# Patient Record
Sex: Female | Born: 1950 | Race: Black or African American | Hispanic: No | Marital: Single | Smoking: Never smoker
Health system: Southern US, Community
[De-identification: ages and names within clinical notes are randomized; demographics above are authoritative.]

## PROBLEM LIST (undated history)

## (undated) DIAGNOSIS — R4586 Emotional lability: Secondary | ICD-10-CM

## (undated) DIAGNOSIS — S8292XA Unspecified fracture of left lower leg, initial encounter for closed fracture: Secondary | ICD-10-CM

## (undated) DIAGNOSIS — Z978 Presence of other specified devices: Secondary | ICD-10-CM

## (undated) DIAGNOSIS — M1990 Unspecified osteoarthritis, unspecified site: Secondary | ICD-10-CM

## (undated) DIAGNOSIS — R569 Unspecified convulsions: Secondary | ICD-10-CM

## (undated) DIAGNOSIS — D696 Thrombocytopenia, unspecified: Secondary | ICD-10-CM

## (undated) DIAGNOSIS — S31809A Unspecified open wound of unspecified buttock, initial encounter: Secondary | ICD-10-CM

## (undated) DIAGNOSIS — D860 Sarcoidosis of lung: Secondary | ICD-10-CM

## (undated) DIAGNOSIS — K589 Irritable bowel syndrome without diarrhea: Secondary | ICD-10-CM

## (undated) DIAGNOSIS — R519 Headache, unspecified: Secondary | ICD-10-CM

## (undated) DIAGNOSIS — I10 Essential (primary) hypertension: Secondary | ICD-10-CM

## (undated) DIAGNOSIS — I890 Lymphedema, not elsewhere classified: Secondary | ICD-10-CM

## (undated) DIAGNOSIS — C801 Malignant (primary) neoplasm, unspecified: Secondary | ICD-10-CM

## (undated) DIAGNOSIS — R51 Headache: Secondary | ICD-10-CM

## (undated) HISTORY — DX: Unspecified fracture of left lower leg, initial encounter for closed fracture: S82.92XA

## (undated) HISTORY — PX: ORIF ELBOW FRACTURE: SUR928

## (undated) HISTORY — PX: TIBIA FRACTURE SURGERY: SHX806

## (undated) HISTORY — PX: EYE SURGERY: SHX253

## (undated) HISTORY — PX: CARPAL TUNNEL RELEASE: SHX101

## (undated) HISTORY — PX: COLONOSCOPY: SHX174

## (undated) HISTORY — PX: CHOLECYSTECTOMY: SHX55

---

## 2006-03-27 DIAGNOSIS — C801 Malignant (primary) neoplasm, unspecified: Secondary | ICD-10-CM

## 2006-03-27 HISTORY — DX: Malignant (primary) neoplasm, unspecified: C80.1

## 2012-11-25 ENCOUNTER — Emergency Department: Payer: Self-pay | Admitting: Emergency Medicine

## 2012-11-25 LAB — URINALYSIS, COMPLETE
Blood: NEGATIVE
Ketone: NEGATIVE
Ph: 7 (ref 4.5–8.0)
Protein: NEGATIVE
RBC,UR: 1 /HPF (ref 0–5)
Squamous Epithelial: 5
WBC UR: 3 /HPF (ref 0–5)

## 2012-11-25 LAB — COMPREHENSIVE METABOLIC PANEL
Anion Gap: 7 (ref 7–16)
BUN: 10 mg/dL (ref 7–18)
Bilirubin,Total: 1.5 mg/dL — ABNORMAL HIGH (ref 0.2–1.0)
Calcium, Total: 8.7 mg/dL (ref 8.5–10.1)
Chloride: 100 mmol/L (ref 98–107)
Co2: 28 mmol/L (ref 21–32)
Glucose: 126 mg/dL — ABNORMAL HIGH (ref 65–99)
Osmolality: 271 (ref 275–301)
SGOT(AST): 29 U/L (ref 15–37)
SGPT (ALT): 28 U/L (ref 12–78)
Sodium: 135 mmol/L — ABNORMAL LOW (ref 136–145)

## 2012-11-25 LAB — CBC
HCT: 40.7 % (ref 35.0–47.0)
MCHC: 35.1 g/dL (ref 32.0–36.0)
MCV: 91 fL (ref 80–100)
Platelet: 238 10*3/uL (ref 150–440)
RBC: 4.49 10*6/uL (ref 3.80–5.20)
RDW: 14.5 % (ref 11.5–14.5)

## 2013-05-17 ENCOUNTER — Emergency Department: Payer: Self-pay | Admitting: Emergency Medicine

## 2013-05-17 LAB — CBC WITH DIFFERENTIAL/PLATELET
BASOS ABS: 0 10*3/uL (ref 0.0–0.1)
Basophil %: 0.7 %
EOS PCT: 3.1 %
Eosinophil #: 0.1 10*3/uL (ref 0.0–0.7)
HCT: 39.6 % (ref 35.0–47.0)
HGB: 13.4 g/dL (ref 12.0–16.0)
Lymphocyte #: 1 10*3/uL (ref 1.0–3.6)
Lymphocyte %: 20.8 %
MCH: 31.3 pg (ref 26.0–34.0)
MCHC: 33.9 g/dL (ref 32.0–36.0)
MCV: 93 fL (ref 80–100)
Monocyte #: 0.3 x10 3/mm (ref 0.2–0.9)
Monocyte %: 6.9 %
NEUTROS ABS: 3.2 10*3/uL (ref 1.4–6.5)
Neutrophil %: 68.5 %
PLATELETS: 185 10*3/uL (ref 150–440)
RBC: 4.28 10*6/uL (ref 3.80–5.20)
RDW: 14.2 % (ref 11.5–14.5)
WBC: 4.7 10*3/uL (ref 3.6–11.0)

## 2013-05-17 LAB — COMPREHENSIVE METABOLIC PANEL
ALBUMIN: 3.3 g/dL — AB (ref 3.4–5.0)
ALK PHOS: 143 U/L — AB
AST: 31 U/L (ref 15–37)
Anion Gap: 3 — ABNORMAL LOW (ref 7–16)
BILIRUBIN TOTAL: 0.7 mg/dL (ref 0.2–1.0)
BUN: 10 mg/dL (ref 7–18)
CHLORIDE: 107 mmol/L (ref 98–107)
CREATININE: 0.82 mg/dL (ref 0.60–1.30)
Calcium, Total: 8.7 mg/dL (ref 8.5–10.1)
Co2: 26 mmol/L (ref 21–32)
EGFR (Non-African Amer.): >60
Glucose: 85 mg/dL (ref 65–99)
Osmolality: 270 (ref 275–301)
Potassium: 4.2 mmol/L (ref 3.5–5.1)
SGPT (ALT): 22 U/L (ref 12–78)
Sodium: 136 mmol/L (ref 136–145)
Total Protein: 7.6 g/dL (ref 6.4–8.2)

## 2013-05-17 LAB — URINALYSIS, COMPLETE
BLOOD: NEGATIVE
Bacteria: NONE SEEN
Bilirubin,UR: NEGATIVE
GLUCOSE, UR: NEGATIVE mg/dL (ref 0–75)
Ketone: NEGATIVE
Nitrite: NEGATIVE
Ph: 6 (ref 4.5–8.0)
Protein: NEGATIVE
Specific Gravity: 1.019 (ref 1.003–1.030)
Squamous Epithelial: 2
WBC UR: 2 /HPF (ref 0–5)

## 2014-01-05 ENCOUNTER — Emergency Department (HOSPITAL_COMMUNITY): Payer: Medicare Other

## 2014-01-05 ENCOUNTER — Encounter (HOSPITAL_COMMUNITY): Payer: Self-pay | Admitting: Emergency Medicine

## 2014-01-05 ENCOUNTER — Emergency Department (HOSPITAL_COMMUNITY)
Admission: EM | Admit: 2014-01-05 | Discharge: 2014-01-05 | Disposition: A | Discharge status: Home / Self Care | Payer: Medicare Other | Attending: Emergency Medicine | Admitting: Emergency Medicine

## 2014-01-05 DIAGNOSIS — Z8619 Personal history of other infectious and parasitic diseases: Secondary | ICD-10-CM | POA: Insufficient documentation

## 2014-01-05 DIAGNOSIS — I10 Essential (primary) hypertension: Secondary | ICD-10-CM | POA: Diagnosis not present

## 2014-01-05 DIAGNOSIS — Z7952 Long term (current) use of systemic steroids: Secondary | ICD-10-CM | POA: Diagnosis not present

## 2014-01-05 DIAGNOSIS — I160 Hypertensive urgency: Secondary | ICD-10-CM

## 2014-01-05 DIAGNOSIS — Z79899 Other long term (current) drug therapy: Secondary | ICD-10-CM | POA: Diagnosis not present

## 2014-01-05 DIAGNOSIS — R51 Headache: Secondary | ICD-10-CM | POA: Diagnosis present

## 2014-01-05 LAB — CBC WITH DIFFERENTIAL/PLATELET
BASOS ABS: 0 10*3/uL (ref 0.0–0.1)
BASOS PCT: 0 % (ref 0–1)
EOS PCT: 2 % (ref 0–5)
Eosinophils Absolute: 0.1 10*3/uL (ref 0.0–0.7)
HCT: 39.7 % (ref 36.0–46.0)
Hemoglobin: 13.5 g/dL (ref 12.0–15.0)
Lymphocytes Relative: 31 % (ref 12–46)
Lymphs Abs: 1.4 10*3/uL (ref 0.7–4.0)
MCH: 31 pg (ref 26.0–34.0)
MCHC: 34 g/dL (ref 30.0–36.0)
MCV: 91.1 fL (ref 78.0–100.0)
MONO ABS: 0.5 10*3/uL (ref 0.1–1.0)
Monocytes Relative: 10 % (ref 3–12)
Neutro Abs: 2.6 10*3/uL (ref 1.7–7.7)
Neutrophils Relative %: 57 % (ref 43–77)
Platelets: 190 10*3/uL (ref 150–400)
RBC: 4.36 MIL/uL (ref 3.87–5.11)
RDW: 13.3 % (ref 11.5–15.5)
WBC: 4.6 10*3/uL (ref 4.0–10.5)

## 2014-01-05 LAB — COMPREHENSIVE METABOLIC PANEL
ALK PHOS: 156 U/L — AB (ref 39–117)
ALT: 14 U/L (ref 0–35)
ANION GAP: 12 (ref 5–15)
AST: 21 U/L (ref 0–37)
Albumin: 3.8 g/dL (ref 3.5–5.2)
BILIRUBIN TOTAL: 0.9 mg/dL (ref 0.3–1.2)
BUN: 12 mg/dL (ref 6–23)
CO2: 24 meq/L (ref 19–32)
CREATININE: 0.71 mg/dL (ref 0.50–1.10)
Calcium: 9.4 mg/dL (ref 8.4–10.5)
Chloride: 100 mEq/L (ref 96–112)
GFR calc Af Amer: >90 mL/min (ref >90)
GFR calc non Af Amer: 89 mL/min — ABNORMAL LOW (ref >90)
Glucose, Bld: 84 mg/dL (ref 70–99)
POTASSIUM: 4.2 meq/L (ref 3.7–5.3)
Sodium: 136 mEq/L — ABNORMAL LOW (ref 137–147)
Total Protein: 8.2 g/dL (ref 6.0–8.3)

## 2014-01-05 MED ORDER — ACETAMINOPHEN 325 MG PO TABS
650.0000 mg | ORAL_TABLET | Freq: Once | ORAL | Status: AC
  Administered 2014-01-05: 650 mg via ORAL
  Filled 2014-01-05: qty 2

## 2014-01-05 MED ORDER — LABETALOL HCL 100 MG PO TABS
100.0000 mg | ORAL_TABLET | Freq: Once | ORAL | Status: AC
  Administered 2014-01-05: 100 mg via ORAL
  Filled 2014-01-05 (×2): qty 1

## 2014-01-05 NOTE — Discharge Instructions (Signed)

## 2014-01-05 NOTE — ED Provider Notes (Signed)
I saw and evaluated the patient, reviewed the resident's note and I agree with the findings and plan.   EKG Interpretation None       Briefly, pt is a 63 y.o. female presenting with headache, described as heaviness x 2 weeks in context of poor medical compliance and poor knowledge of health conditions.  I performed an examination on the patient including cardiac, pulmonary, and gi systems which were unremarkable.  Neuro exam nonfocal with normal gait.  States that she has a history of headaches in the past with her blood pressure being elevated and the primary precipitant for visit today was a systolic blood pressure 977.  Symptoms improved with blood pressure control. This is likely etiology of symptoms. Discharged in stable condition.  PCP: Tessa Lerner.     Debby Freiberg, MD 01/06/14 [phone]

## 2014-01-05 NOTE — ED Provider Notes (Signed)
CSN: [id number]     Arrival date & time 01/05/14  1128 History   First MD Initiated Contact with Patient 01/05/14 1505     Chief Complaint  Patient presents with  . Hypertension  . Headache     (Consider location/radiation/quality/duration/timing/severity/associated sxs/prior Treatment) Patient is a 63 y.o. female presenting with headaches. The history is provided by the patient.  Headache Pain location:  L parietal Quality:  Dull Radiates to:  Does not radiate Severity currently:  Unable to specify Severity at highest:  Unable to specify Onset quality:  Gradual Duration: Patient states she has had headaches since her concussion in 2010. Timing:  Intermittent Progression:  Waxing and waning Chronicity:  Chronic Similar to prior headaches: yes   Context: not activity and not exposure to bright light   Relieved by: Patient states she has tried "what ever she can get" Associated symptoms: no abdominal pain, no back pain, no congestion, no cough, no diarrhea, no pain, no fatigue, no fever, no nausea, no numbness, no paresthesias, no photophobia, no URI, no vomiting and no weakness   Risk factors comment:  History of sarcoid, history of htn   History reviewed. No pertinent past medical history. History reviewed. No pertinent past surgical history. History reviewed. No pertinent family history. History  Substance Use Topics  . Smoking status: Never Smoker   . Smokeless tobacco: Not on file  . Alcohol Use: No   OB History   Grav Para Term Preterm Abortions TAB SAB Ect Mult Living                 Review of Systems  Constitutional: Negative for fever, activity change, appetite change and fatigue.  HENT: Negative for congestion and rhinorrhea.   Eyes: Negative for photophobia, pain, discharge, redness and itching.  Respiratory: Negative for cough, shortness of breath and wheezing.   Cardiovascular: Negative for chest pain.  Gastrointestinal: Negative for nausea, vomiting,  abdominal pain and diarrhea.  Genitourinary: Negative for dysuria and hematuria.  Musculoskeletal: Negative for back pain.  Skin: Negative for rash and wound.  Neurological: Positive for headaches. Negative for syncope, numbness and paresthesias.      Allergies  Review of patient's allergies indicates no known allergies.  Home Medications   Prior to Admission medications   Medication Sig Start Date End Date Taking? Authorizing Provider  amLODipine (NORVASC) 2.5 MG tablet Take 2.5 mg by mouth daily.   Yes Historical Provider, MD  prednisoLONE 5 MG TABS tablet Take 5 mg by mouth daily.   Yes Historical Provider, MD  spironolactone (ALDACTONE) 25 MG tablet Take 25 mg by mouth 2 (two) times daily.   Yes Historical Provider, MD   BP 157/73  Pulse 67  Temp(Src) 98.3 F (36.8 C) (Oral)  Resp 18  SpO2 100% Physical Exam  Constitutional: She is oriented to person, place, and time. She appears well-developed and well-nourished. No distress.  HENT:  Head: Normocephalic and atraumatic.  Mouth/Throat: Oropharynx is clear and moist. No oropharyngeal exudate.  Eyes: Conjunctivae and EOM are normal. Pupils are equal, round, and reactive to light. Right eye exhibits no discharge. Left eye exhibits no discharge. No scleral icterus.  Neck: Normal range of motion. Neck supple.  Full range of motion without rigidity  Cardiovascular: Normal rate, regular rhythm and normal heart sounds.   No murmur heard. Pulmonary/Chest: Effort normal and breath sounds normal. No respiratory distress. She has no wheezes. She has no rales.  Abdominal: Soft. She exhibits no distension and no mass.  There is no tenderness.  Neurological: She is alert and oriented to person, place, and time. She has normal reflexes. No cranial nerve deficit. She exhibits normal muscle tone. Coordination normal.  5/5 strength in all extremities  Normal sensation in all exts 2+ DTRs patella and brachioradialis Cranial nerves to 12  intact No ataxia on ambulation   Skin: Skin is warm. No rash noted. She is not diaphoretic.    ED Course  Procedures (including critical care time) Labs Review Labs Reviewed  COMPREHENSIVE METABOLIC PANEL - Abnormal; Notable for the following:    Sodium 136 (*)    Alkaline Phosphatase 156 (*)    GFR calc non Af Amer 89 (*)    All other components within normal limits  CBC WITH DIFFERENTIAL    Imaging Review Ct Head Wo Contrast  01/05/2014   CLINICAL DATA:  Hypertension, headache.  EXAM: CT HEAD WITHOUT CONTRAST  TECHNIQUE: Contiguous axial images were obtained from the base of the skull through the vertex without intravenous contrast.  COMPARISON:  04/06/2011  FINDINGS: No acute intracranial abnormality. Specifically, no hemorrhage, hydrocephalus, mass lesion, acute infarction, or significant intracranial injury. No acute calvarial abnormality. Visualized paranasal sinuses and mastoids clear. Orbital soft tissues unremarkable.  IMPRESSION: No acute intracranial abnormality.   Electronically Signed   By: Rolm Baptise M.D.   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 Today had similar headache for the past one to 2 weeks. Unlikely meningitis. Patient's older history of sarcoidosis that is partially treated concern for intracranial process. We'll obtain CT scan. Patient concerned about blood pressure however is elevated but not critical high and no signs of endorgan dysfunction. Instructed patient to followup PCP for further blood pressure management. APAP given for pain. Labetalol Po given for blood pressure. She feels better. Symptoms questionable for postconcussion or hypertensive urgency. Head CT negative for bleed or other intracranial process. Exam not consistent with meningitis. Nnormal neuro exam with good gait  Patient has PCP to she will call tomorrow. Discharged..  Final diagnoses:   Hypertensive urgency    Discharge Medication List as of 01/05/2014  6:16 PM     Marco Collie, MD [address] [place] [address] [phone]  Schedule an appointment as soon as possible for a visit     Sol Passer, MD 01/05/14 2129

## 2014-01-05 NOTE — ED Notes (Signed)
Assisted pt to restroom without incident. Denies pain or dizziness

## 2014-01-05 NOTE — ED Notes (Signed)
Pt ambulatory to room with  No issues.

## 2014-01-05 NOTE — ED Notes (Signed)
Pt c/o HA and htn; pt sts not taking meds as supposed to; pt thinks her potassium may be low

## 2014-05-25 ENCOUNTER — Encounter (HOSPITAL_COMMUNITY): Payer: Self-pay | Admitting: *Deleted

## 2014-05-25 ENCOUNTER — Emergency Department (HOSPITAL_COMMUNITY)
Admission: EM | Admit: 2014-05-25 | Discharge: 2014-05-25 | Disposition: A | Discharge status: Home / Self Care | Payer: Medicare Other | Attending: Emergency Medicine | Admitting: Emergency Medicine

## 2014-05-25 DIAGNOSIS — Z79899 Other long term (current) drug therapy: Secondary | ICD-10-CM | POA: Insufficient documentation

## 2014-05-25 DIAGNOSIS — Z8719 Personal history of other diseases of the digestive system: Secondary | ICD-10-CM | POA: Diagnosis not present

## 2014-05-25 DIAGNOSIS — Z7952 Long term (current) use of systemic steroids: Secondary | ICD-10-CM | POA: Insufficient documentation

## 2014-05-25 DIAGNOSIS — Z862 Personal history of diseases of the blood and blood-forming organs and certain disorders involving the immune mechanism: Secondary | ICD-10-CM | POA: Diagnosis not present

## 2014-05-25 DIAGNOSIS — R1013 Epigastric pain: Secondary | ICD-10-CM | POA: Insufficient documentation

## 2014-05-25 DIAGNOSIS — R197 Diarrhea, unspecified: Secondary | ICD-10-CM | POA: Diagnosis not present

## 2014-05-25 DIAGNOSIS — R112 Nausea with vomiting, unspecified: Secondary | ICD-10-CM | POA: Insufficient documentation

## 2014-05-25 DIAGNOSIS — Z85048 Personal history of other malignant neoplasm of rectum, rectosigmoid junction, and anus: Secondary | ICD-10-CM | POA: Diagnosis not present

## 2014-05-25 DIAGNOSIS — I10 Essential (primary) hypertension: Secondary | ICD-10-CM | POA: Diagnosis not present

## 2014-05-25 DIAGNOSIS — R109 Unspecified abdominal pain: Secondary | ICD-10-CM

## 2014-05-25 HISTORY — DX: Irritable bowel syndrome, unspecified: K58.9

## 2014-05-25 HISTORY — DX: Essential (primary) hypertension: I10

## 2014-05-25 HISTORY — DX: Malignant (primary) neoplasm, unspecified: C80.1

## 2014-05-25 HISTORY — DX: Sarcoidosis of lung: D86.0

## 2014-05-25 LAB — CBC WITH DIFFERENTIAL/PLATELET
BASOS ABS: 0 10*3/uL (ref 0.0–0.1)
BASOS PCT: 0 % (ref 0–1)
Eosinophils Absolute: 0.1 10*3/uL (ref 0.0–0.7)
Eosinophils Relative: 0 % (ref 0–5)
HCT: 46.3 % — ABNORMAL HIGH (ref 36.0–46.0)
HEMOGLOBIN: 15.6 g/dL — AB (ref 12.0–15.0)
Lymphocytes Relative: 9 % — ABNORMAL LOW (ref 12–46)
Lymphs Abs: 1.1 10*3/uL (ref 0.7–4.0)
MCH: 31 pg (ref 26.0–34.0)
MCHC: 33.7 g/dL (ref 30.0–36.0)
MCV: 91.9 fL (ref 78.0–100.0)
MONOS PCT: 7 % (ref 3–12)
Monocytes Absolute: 0.8 10*3/uL (ref 0.1–1.0)
NEUTROS ABS: 10.2 10*3/uL — AB (ref 1.7–7.7)
NEUTROS PCT: 84 % — AB (ref 43–77)
PLATELETS: 210 10*3/uL (ref 150–400)
RBC: 5.04 MIL/uL (ref 3.87–5.11)
RDW: 13.7 % (ref 11.5–15.5)
WBC: 12.1 10*3/uL — ABNORMAL HIGH (ref 4.0–10.5)

## 2014-05-25 LAB — COMPREHENSIVE METABOLIC PANEL
ALBUMIN: 3.2 g/dL — AB (ref 3.5–5.2)
ALT: 17 U/L (ref 0–35)
ANION GAP: 6 (ref 5–15)
AST: 20 U/L (ref 0–37)
Alkaline Phosphatase: 107 U/L (ref 39–117)
BUN: 11 mg/dL (ref 6–23)
CO2: 27 mmol/L (ref 19–32)
Calcium: 8.6 mg/dL (ref 8.4–10.5)
Chloride: 103 mmol/L (ref 96–112)
Creatinine, Ser: 0.8 mg/dL (ref 0.50–1.10)
GFR calc non Af Amer: 76 mL/min — ABNORMAL LOW (ref >90)
GFR, EST AFRICAN AMERICAN: 88 mL/min — AB (ref >90)
GLUCOSE: 114 mg/dL — AB (ref 70–99)
Potassium: 3.1 mmol/L — ABNORMAL LOW (ref 3.5–5.1)
Sodium: 136 mmol/L (ref 135–145)
TOTAL PROTEIN: 6.9 g/dL (ref 6.0–8.3)
Total Bilirubin: 1.5 mg/dL — ABNORMAL HIGH (ref 0.3–1.2)

## 2014-05-25 LAB — I-STAT TROPONIN, ED: TROPONIN I, POC: 0 ng/mL (ref 0.00–0.08)

## 2014-05-25 LAB — LIPASE, BLOOD: Lipase: 22 U/L (ref 11–59)

## 2014-05-25 MED ORDER — ONDANSETRON 4 MG PO TBDP: 4.0000 mg | ORAL_TABLET | Freq: Three times a day (TID) | ORAL | Status: DC | PRN

## 2014-05-25 MED ORDER — HYDROCODONE-ACETAMINOPHEN 5-325 MG PO TABS: 1.0000 | ORAL_TABLET | ORAL | Status: DC | PRN

## 2014-05-25 MED ORDER — FAMOTIDINE IN NACL 20-0.9 MG/50ML-% IV SOLN
20.0000 mg | Freq: Once | INTRAVENOUS | Status: AC
  Administered 2014-05-25: 20 mg via INTRAVENOUS
  Filled 2014-05-25: qty 50

## 2014-05-25 MED ORDER — MORPHINE SULFATE 4 MG/ML IJ SOLN
4.0000 mg | Freq: Once | INTRAMUSCULAR | Status: AC
  Administered 2014-05-25: 4 mg via INTRAVENOUS
  Filled 2014-05-25: qty 1

## 2014-05-25 MED ORDER — POTASSIUM CHLORIDE CRYS ER 20 MEQ PO TBCR
40.0000 meq | EXTENDED_RELEASE_TABLET | Freq: Once | ORAL | Status: AC
  Administered 2014-05-25: 40 meq via ORAL
  Filled 2014-05-25: qty 2

## 2014-05-25 MED ORDER — SODIUM CHLORIDE 0.9 % IV BOLUS (SEPSIS)
500.0000 mL | Freq: Once | INTRAVENOUS | Status: AC
  Administered 2014-05-25: 500 mL via INTRAVENOUS

## 2014-05-25 NOTE — ED Provider Notes (Signed)
CSN: [id number]     Arrival date & time 05/25/14  0919 History   First MD Initiated Contact with Patient 05/25/14 [phone]     Chief Complaint  Patient presents with  . Abdominal Pain  . Nausea     (Consider location/radiation/quality/duration/timing/severity/associated sxs/prior Treatment) Patient is a 64 y.o. female presenting with abdominal pain. The history is provided by the patient and medical records.  Abdominal Pain Associated symptoms: diarrhea, nausea and vomiting     This is a 64 year old female with past medical history significant for hypertension, IBS, prior anal cancer, presenting to the ED for epigastric pain, nausea, vomiting, and diarrhea onset last night around 2130.  Patient states yesterday morning she ate some bacon from [employer] that she thinks was expired. States later that afternoon she ate a fish fillet sandwich and french fries from [employer] and began vomiting soon afterwards. She states she has some generalized abdominal cramping. She is a multiple episodes of nonbloody, nonbilious emesis and watery diarrhea. No melena or hematochezia. She denies any fever or chills. No sick contacts. She denies any urinary symptoms.  Prior abdominal surgeries include cholecystectomy.  Patient was given 4 of Zofran and route, still continues to have some nausea. Vital signs stable on arrival.  Past Medical History  Diagnosis Date  . IBS (irritable bowel syndrome)   . Cancer 2008    anal  . Sarcoidosis of lung   . Hypertension    Past Surgical History  Procedure Laterality Date  . Eye surgery      r/t sarcoidisis  . Cholecystectomy    . Carpal tunnel release     No family history on file. History  Substance Use Topics  . Smoking status: Never Smoker   . Smokeless tobacco: Not on file  . Alcohol Use: No   OB History    No data available     Review of Systems  Gastrointestinal: Positive for nausea, vomiting, abdominal pain and diarrhea.  All other systems reviewed  and are negative.     Allergies  Review of patient's allergies indicates no known allergies.  Home Medications   Prior to Admission medications   Medication Sig Start Date End Date Taking? Authorizing Provider  amLODipine (NORVASC) 2.5 MG tablet Take 2.5 mg by mouth daily.    Historical Provider, MD  prednisoLONE 5 MG TABS tablet Take 5 mg by mouth daily.    Historical Provider, MD  spironolactone (ALDACTONE) 25 MG tablet Take 25 mg by mouth 2 (two) times daily.    Historical Provider, MD   BP 152/88 mmHg  Pulse 77  Temp(Src) 97.8 F (36.6 C) (Oral)  Resp 16  Ht 5\' 4"  (1.626 m)  Wt 159 lb (72.122 kg)  BMI 27.28 kg/m2  SpO2 96%   Physical Exam  Constitutional: She is oriented to person, place, and time. She appears well-developed and well-nourished. No distress.  HENT:  Head: Normocephalic and atraumatic.  Mouth/Throat: Oropharynx is clear and moist.  Mucous membranes remain moist  Eyes: Conjunctivae and EOM are normal. Pupils are equal, round, and reactive to light.  Neck: Normal range of motion. Neck supple.  Cardiovascular: Normal rate, regular rhythm and normal heart sounds.   Pulmonary/Chest: Effort normal and breath sounds normal. No respiratory distress. She has no wheezes.  Abdominal: Soft. Bowel sounds are normal. There is no tenderness. There is no guarding and no CVA tenderness.  Abdomen soft, non-distended, no focal tenderness or peritoneal signs  Musculoskeletal: Normal range of motion.  Neurological: She  is alert and oriented to person, place, and time.  Skin: Skin is warm and dry. She is not diaphoretic.  Psychiatric: She has a normal mood and affect.  Nursing note and vitals reviewed.   ED Course  Procedures (including critical care time) Labs Review Labs Reviewed  CBC WITH DIFFERENTIAL/PLATELET - Abnormal; Notable for the following:    WBC 12.1 (*)    Hemoglobin 15.6 (*)    HCT 46.3 (*)    Neutrophils Relative % 84 (*)    Neutro Abs 10.2 (*)     Lymphocytes Relative 9 (*)    All other components within normal limits  COMPREHENSIVE METABOLIC PANEL - Abnormal; Notable for the following:    Potassium 3.1 (*)    Glucose, Bld 114 (*)    Albumin 3.2 (*)    Total Bilirubin 1.5 (*)    GFR calc non Af Amer 76 (*)    GFR calc Af Amer 88 (*)    All other components within normal limits  LIPASE, BLOOD  I-STAT TROPOININ, ED    Imaging Review No results found.   EKG Interpretation None      MDM   Final diagnoses:  Abdominal pain, unspecified abdominal location  Nausea vomiting and diarrhea   64 y.o. F with N/V/D onset last night after eating bacon which she thinks was expired followed by fish sandwich and french fries.  On arrival, patient afebrile and non-toxic in appearance.  She is in NAD, her abdominal exam is benign.  She was given zofran en route but remains somewhat nauseated.  Will give additional meds, will obtain basic labs.  No chest pain or SOB.  Lab work overall reassuring.  Mild hypokalemia noted, will be replaced here.  After additional meds, patient states she is feeling much better.  She has tolerated PO without difficulty.  Abdominal exam remains benign.  Patient will be d/c home with supportive care.  Encouraged gentle diet and progress back to normal as tolerated, plenty of fluids to stay hydrated.  FU with PCP.  Discussed plan with patient, he/she acknowledged understanding and agreed with plan of care.  Return precautions given for new or worsening symptoms.  Larene Pickett, PA-C 05/25/14 [place], MD 05/26/14 [phone]

## 2014-05-25 NOTE — ED Notes (Addendum)
Pt with acute onset epigastric pain, nausea and diarrhea around 2130 last night.  She feels it's r/t the bacon she ate earlier that day.  Given 4 mg zofran enroute.

## 2014-05-25 NOTE — Discharge Instructions (Signed)
Take the prescribed medication as directed.  May wish to start with gentle diet and progress back to normal as tolerated. Follow-up with your primary care physician. Return to the ED for new or worsening symptoms.

## 2014-06-30 ENCOUNTER — Ambulatory Visit
Admit: 2014-06-30 | Disposition: A | Discharge status: Home / Self Care | Payer: Self-pay | Attending: Family Medicine | Admitting: Family Medicine

## 2014-07-06 ENCOUNTER — Ambulatory Visit
Admit: 2014-07-06 | Disposition: A | Discharge status: Home / Self Care | Payer: Self-pay | Attending: Family Medicine | Admitting: Family Medicine

## 2014-10-22 ENCOUNTER — Encounter (HOSPITAL_COMMUNITY): Payer: Self-pay | Admitting: Emergency Medicine

## 2014-10-22 DIAGNOSIS — Z8719 Personal history of other diseases of the digestive system: Secondary | ICD-10-CM | POA: Insufficient documentation

## 2014-10-22 DIAGNOSIS — M25562 Pain in left knee: Secondary | ICD-10-CM | POA: Diagnosis not present

## 2014-10-22 DIAGNOSIS — Z85048 Personal history of other malignant neoplasm of rectum, rectosigmoid junction, and anus: Secondary | ICD-10-CM | POA: Insufficient documentation

## 2014-10-22 DIAGNOSIS — Z79899 Other long term (current) drug therapy: Secondary | ICD-10-CM | POA: Insufficient documentation

## 2014-10-22 DIAGNOSIS — Z862 Personal history of diseases of the blood and blood-forming organs and certain disorders involving the immune mechanism: Secondary | ICD-10-CM | POA: Insufficient documentation

## 2014-10-22 DIAGNOSIS — I10 Essential (primary) hypertension: Secondary | ICD-10-CM | POA: Insufficient documentation

## 2014-10-22 DIAGNOSIS — M1990 Unspecified osteoarthritis, unspecified site: Secondary | ICD-10-CM | POA: Insufficient documentation

## 2014-10-22 DIAGNOSIS — M25552 Pain in left hip: Secondary | ICD-10-CM | POA: Diagnosis present

## 2014-10-22 DIAGNOSIS — Z7952 Long term (current) use of systemic steroids: Secondary | ICD-10-CM | POA: Insufficient documentation

## 2014-10-22 NOTE — ED Notes (Signed)
Pt. reports chronic left hip pain worse today , denies injury or fall , pt. also reported left knee pain .

## 2014-10-23 ENCOUNTER — Emergency Department (HOSPITAL_COMMUNITY)
Admission: EM | Admit: 2014-10-23 | Discharge: 2014-10-23 | Disposition: A | Discharge status: Home / Self Care | Payer: Medicare Other | Attending: Emergency Medicine | Admitting: Emergency Medicine

## 2014-10-23 DIAGNOSIS — M1990 Unspecified osteoarthritis, unspecified site: Secondary | ICD-10-CM

## 2014-10-23 MED ORDER — HYDROCODONE-ACETAMINOPHEN 5-325 MG PO TABS: 1.0000 | ORAL_TABLET | ORAL | Status: DC | PRN

## 2014-10-23 MED ORDER — MELOXICAM 7.5 MG PO TABS: 7.5000 mg | ORAL_TABLET | Freq: Every day | ORAL | Status: DC

## 2014-10-23 MED ORDER — HYDROCODONE-ACETAMINOPHEN 5-325 MG PO TABS
1.0000 | ORAL_TABLET | Freq: Once | ORAL | Status: AC
  Administered 2014-10-23: 1 via ORAL
  Filled 2014-10-23: qty 1

## 2014-10-23 NOTE — Discharge Instructions (Signed)
Arthritis, Nonspecific °Arthritis is inflammation of a joint. This usually means pain, redness, warmth or swelling are present. One or more joints may be involved. There are a number of types of arthritis. Your caregiver may not be able to tell what type of arthritis you have right away. °CAUSES  °The most common cause of arthritis is the wear and tear on the joint (osteoarthritis). This causes damage to the cartilage, which can break down over time. The knees, hips, back and neck are most often affected by this type of arthritis. °Other types of arthritis and common causes of joint pain include: °· Sprains and other injuries near the joint. Sometimes minor sprains and injuries cause pain and swelling that develop hours later. °· Rheumatoid arthritis. This affects hands, feet and knees. It usually affects both sides of your body at the same time. It is often associated with chronic ailments, fever, weight loss and general weakness. °· Crystal arthritis. Gout and pseudo gout can cause occasional acute severe pain, redness and swelling in the foot, ankle, or knee. °· Infectious arthritis. Bacteria can get into a joint through a break in overlying skin. This can cause infection of the joint. Bacteria and viruses can also spread through the blood and affect your joints. °· Drug, infectious and allergy reactions. Sometimes joints can become mildly painful and slightly swollen with these types of illnesses. °SYMPTOMS  °· Pain is the main symptom. °· Your joint or joints can also be red, swollen and warm or hot to the touch. °· You may have a fever with certain types of arthritis, or even feel overall ill. °· The joint with arthritis will hurt with movement. Stiffness is present with some types of arthritis. °DIAGNOSIS  °Your caregiver will suspect arthritis based on your description of your symptoms and on your exam. Testing may be needed to find the type of arthritis: °· Blood and sometimes urine tests. °· X-ray tests  and sometimes CT or MRI scans. °· Removal of fluid from the joint (arthrocentesis) is done to check for bacteria, crystals or other causes. Your caregiver (or a specialist) will numb the area over the joint with a local anesthetic, and use a needle to remove joint fluid for examination. This procedure is only minimally uncomfortable. °· Even with these tests, your caregiver may not be able to tell what kind of arthritis you have. Consultation with a specialist (rheumatologist) may be helpful. °TREATMENT  °Your caregiver will discuss with you treatment specific to your type of arthritis. If the specific type cannot be determined, then the following general recommendations may apply. °Treatment of severe joint pain includes: °· Rest. °· Elevation. °· Anti-inflammatory medication (for example, ibuprofen) may be prescribed. Avoiding activities that cause increased pain. °· Only take over-the-counter or prescription medicines for pain and discomfort as recommended by your caregiver. °· Cold packs over an inflamed joint may be used for 10 to 15 minutes every hour. Hot packs sometimes feel better, but do not use overnight. Do not use hot packs if you are diabetic without your caregiver's permission. °· A cortisone shot into arthritic joints may help reduce pain and swelling. °· Any acute arthritis that gets worse over the next 1 to 2 days needs to be looked at to be sure there is no joint infection. °Long-term arthritis treatment involves modifying activities and lifestyle to reduce joint stress jarring. This can include weight loss. Also, exercise is needed to nourish the joint cartilage and remove waste. This helps keep the muscles   around the joint strong. °HOME CARE INSTRUCTIONS  °· Do not take aspirin to relieve pain if gout is suspected. This elevates uric acid levels. °· Only take over-the-counter or prescription medicines for pain, discomfort or fever as directed by your caregiver. °· Rest the joint as much as  possible. °· If your joint is swollen, keep it elevated. °· Use crutches if the painful joint is in your leg. °· Drinking plenty of fluids may help for certain types of arthritis. °· Follow your caregiver's dietary instructions. °· Try low-impact exercise such as: °¨ Swimming. °¨ Water aerobics. °¨ Biking. °¨ Walking. °· Morning stiffness is often relieved by a warm shower. °· Put your joints through regular range-of-motion. °SEEK MEDICAL CARE IF:  °· You do not feel better in 24 hours or are getting worse. °· You have side effects to medications, or are not getting better with treatment. °SEEK IMMEDIATE MEDICAL CARE IF:  °· You have a fever. °· You develop severe joint pain, swelling or redness. °· Many joints are involved and become painful and swollen. °· There is severe back pain and/or leg weakness. °· You have loss of bowel or bladder control. °Document Released: 04/20/2004 Document Revised: 06/05/2011 Document Reviewed: 05/06/2008 °[employer]® Patient Information ©2015 [employer]. This information is not intended to replace advice given to you by your health care provider. Make sure you discuss any questions you have with your health care provider. ° °

## 2014-10-23 NOTE — ED Provider Notes (Signed)
CSN: [id number]     Arrival date & time 10/22/14  2341 History   First MD Initiated Contact with Patient 10/23/14 0232     Chief Complaint  Patient presents with  . Hip Pain  . Knee Pain     (Consider location/radiation/quality/duration/timing/severity/associated sxs/prior Treatment) Patient is a 64 y.o. female presenting with hip pain and knee pain. The history is provided by the patient. No language interpreter was used.  Hip Pain This is a chronic problem. Pertinent negatives include no abdominal pain, chest pain, chills, fever or numbness. Associated symptoms comments: Known arthritis of left hip with uncontrolled pain at home. No fall or new injury. She is taking neurontin for pain without relief. She reports that her left knee is now painful and swollen as well. She has been told she needs a knee replacement but is unable to pursue the procedure currently..  Knee Pain Associated symptoms: no fever     Past Medical History  Diagnosis Date  . IBS (irritable bowel syndrome)   . Cancer 2008    anal  . Sarcoidosis of lung   . Hypertension    Past Surgical History  Procedure Laterality Date  . Eye surgery      r/t sarcoidisis  . Cholecystectomy    . Carpal tunnel release     No family history on file. History  Substance Use Topics  . Smoking status: Never Smoker   . Smokeless tobacco: Not on file  . Alcohol Use: No   OB History    No data available     Review of Systems  Constitutional: Negative for fever and chills.  Respiratory: Negative.  Negative for shortness of breath.   Cardiovascular: Negative.  Negative for chest pain.  Gastrointestinal: Negative.  Negative for abdominal pain.  Genitourinary: Negative for enuresis.  Musculoskeletal:       See HPI.  Skin: Negative.  Negative for color change.  Neurological: Negative.  Negative for numbness.      Allergies  Review of patient's allergies indicates no known allergies.  Home Medications   Prior to  Admission medications   Medication Sig Start Date End Date Taking? Authorizing Provider  amLODipine (NORVASC) 5 MG tablet Take 5 mg by mouth daily.   Yes Historical Provider, MD  gabapentin (NEURONTIN) 100 MG capsule Take 100 mg by mouth 3 (three) times daily.   Yes Historical Provider, MD  lisinopril-hydrochlorothiazide (PRINZIDE,ZESTORETIC) 20-25 MG per tablet Take 1 tablet by mouth daily.   Yes Historical Provider, MD  predniSONE (DELTASONE) 5 MG tablet Take 5 mg by mouth daily.   Yes Historical Provider, MD  HYDROcodone-acetaminophen (NORCO/VICODIN) 5-325 MG per tablet Take 1 tablet by mouth every 4 (four) hours as needed. Patient not taking: Reported on 10/23/2014 05/25/14   Larene Pickett, PA-C  ondansetron (ZOFRAN ODT) 4 MG disintegrating tablet Take 1 tablet (4 mg total) by mouth every 8 (eight) hours as needed for nausea. Patient not taking: Reported on 10/23/2014 05/25/14   Larene Pickett, PA-C   BP 166/80 mmHg  Pulse 80  Temp(Src) 98 F (36.7 C) (Oral)  Resp 14  Ht 5\' 4"  (1.626 m)  Wt 156 lb (70.761 kg)  BMI 26.76 kg/m2  SpO2 96% Physical Exam  Constitutional: She is oriented to person, place, and time. She appears well-developed and well-nourished.  Neck: Normal range of motion.  Cardiovascular: Intact distal pulses.   Pulmonary/Chest: Effort normal.  Musculoskeletal:  Left knee mildly swollen without effusion. No redness or warmth. Joint  stable. No calf or thigh tenderness of swelling. Left hip markedly tender over lateral surface.   Neurological: She is alert and oriented to person, place, and time.  Skin: Skin is warm and dry.    ED Course  Procedures (including critical care time) Labs Review Labs Reviewed - No data to display  Imaging Review No results found.   EKG Interpretation None      MDM   Final diagnoses:  None    1. Arthritis  Will provide Mobic and pain relief (Norco #15). Encouraged follow up with ortho as soon as possible for pain management  and further treatment of joint pain/arthritis. Recommended an assistive walking device, i.e. Walker or cane.    Charlann Lange, PA-C 10/23/14 2957  Everlene Balls, MD 10/23/14 1440

## 2015-03-31 ENCOUNTER — Emergency Department (HOSPITAL_COMMUNITY)
Admission: EM | Admit: 2015-03-31 | Discharge: 2015-03-31 | Disposition: A | Discharge status: Home / Self Care | Payer: No Typology Code available for payment source | Attending: Physician Assistant | Admitting: Physician Assistant

## 2015-03-31 ENCOUNTER — Encounter (HOSPITAL_COMMUNITY): Payer: Self-pay

## 2015-03-31 ENCOUNTER — Emergency Department (HOSPITAL_COMMUNITY): Payer: No Typology Code available for payment source

## 2015-03-31 DIAGNOSIS — Z791 Long term (current) use of non-steroidal anti-inflammatories (NSAID): Secondary | ICD-10-CM | POA: Insufficient documentation

## 2015-03-31 DIAGNOSIS — S3991XA Unspecified injury of abdomen, initial encounter: Secondary | ICD-10-CM | POA: Diagnosis not present

## 2015-03-31 DIAGNOSIS — Y998 Other external cause status: Secondary | ICD-10-CM | POA: Diagnosis not present

## 2015-03-31 DIAGNOSIS — Z85040 Personal history of malignant carcinoid tumor of rectum: Secondary | ICD-10-CM | POA: Insufficient documentation

## 2015-03-31 DIAGNOSIS — Z79899 Other long term (current) drug therapy: Secondary | ICD-10-CM | POA: Insufficient documentation

## 2015-03-31 DIAGNOSIS — I10 Essential (primary) hypertension: Secondary | ICD-10-CM | POA: Insufficient documentation

## 2015-03-31 DIAGNOSIS — Y9389 Activity, other specified: Secondary | ICD-10-CM | POA: Diagnosis not present

## 2015-03-31 DIAGNOSIS — Y92410 Unspecified street and highway as the place of occurrence of the external cause: Secondary | ICD-10-CM | POA: Insufficient documentation

## 2015-03-31 LAB — URINALYSIS, ROUTINE W REFLEX MICROSCOPIC
Bilirubin Urine: NEGATIVE
Glucose, UA: NEGATIVE mg/dL
Hgb urine dipstick: NEGATIVE
Ketones, ur: NEGATIVE mg/dL
Leukocytes, UA: NEGATIVE
Nitrite: NEGATIVE
Protein, ur: NEGATIVE mg/dL
Specific Gravity, Urine: 1.016 (ref 1.005–1.030)
pH: 7 (ref 5.0–8.0)

## 2015-03-31 MED ORDER — IBUPROFEN 800 MG PO TABS: 800.0000 mg | ORAL_TABLET | Freq: Three times a day (TID) | ORAL | Status: DC

## 2015-03-31 MED ORDER — CYCLOBENZAPRINE HCL 10 MG PO TABS: 10.0000 mg | ORAL_TABLET | Freq: Two times a day (BID) | ORAL | Status: DC | PRN
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
## 2015-04-01 LAB — URINE CULTURE

## 2015-04-20 ENCOUNTER — Other Ambulatory Visit: Payer: Self-pay | Admitting: Family Medicine

## 2015-04-20 DIAGNOSIS — R911 Solitary pulmonary nodule: Secondary | ICD-10-CM

## 2015-04-20 DIAGNOSIS — D499 Neoplasm of unspecified behavior of unspecified site: Secondary | ICD-10-CM

## 2015-04-25 ENCOUNTER — Observation Stay (HOSPITAL_COMMUNITY): Payer: Medicare Other

## 2015-04-25 ENCOUNTER — Encounter (HOSPITAL_COMMUNITY): Payer: Self-pay | Admitting: *Deleted

## 2015-04-25 ENCOUNTER — Observation Stay (HOSPITAL_COMMUNITY)
Admission: EM | Admit: 2015-04-25 | Discharge: 2015-04-26 | Disposition: A | Discharge status: Home / Self Care | Payer: Medicare Other | Attending: Internal Medicine | Admitting: Internal Medicine

## 2015-04-25 ENCOUNTER — Emergency Department (HOSPITAL_COMMUNITY): Payer: Medicare Other

## 2015-04-25 DIAGNOSIS — I10 Essential (primary) hypertension: Secondary | ICD-10-CM | POA: Diagnosis not present

## 2015-04-25 DIAGNOSIS — E876 Hypokalemia: Secondary | ICD-10-CM | POA: Diagnosis not present

## 2015-04-25 DIAGNOSIS — G40009 Localization-related (focal) (partial) idiopathic epilepsy and epileptic syndromes with seizures of localized onset, not intractable, without status epilepticus: Secondary | ICD-10-CM | POA: Insufficient documentation

## 2015-04-25 DIAGNOSIS — E872 Acidosis, unspecified: Secondary | ICD-10-CM

## 2015-04-25 DIAGNOSIS — G40419 Other generalized epilepsy and epileptic syndromes, intractable, without status epilepticus: Principal | ICD-10-CM | POA: Insufficient documentation

## 2015-04-25 DIAGNOSIS — Z7952 Long term (current) use of systemic steroids: Secondary | ICD-10-CM | POA: Diagnosis not present

## 2015-04-25 DIAGNOSIS — R569 Unspecified convulsions: Secondary | ICD-10-CM | POA: Diagnosis present

## 2015-04-25 DIAGNOSIS — Z85048 Personal history of other malignant neoplasm of rectum, rectosigmoid junction, and anus: Secondary | ICD-10-CM | POA: Diagnosis not present

## 2015-04-25 DIAGNOSIS — R519 Headache, unspecified: Secondary | ICD-10-CM | POA: Diagnosis present

## 2015-04-25 DIAGNOSIS — G40409 Other generalized epilepsy and epileptic syndromes, not intractable, without status epilepticus: Secondary | ICD-10-CM | POA: Insufficient documentation

## 2015-04-25 DIAGNOSIS — D860 Sarcoidosis of lung: Secondary | ICD-10-CM | POA: Diagnosis not present

## 2015-04-25 DIAGNOSIS — R51 Headache: Secondary | ICD-10-CM | POA: Insufficient documentation

## 2015-04-25 DIAGNOSIS — C801 Malignant (primary) neoplasm, unspecified: Secondary | ICD-10-CM | POA: Insufficient documentation

## 2015-04-25 DIAGNOSIS — R262 Difficulty in walking, not elsewhere classified: Secondary | ICD-10-CM | POA: Insufficient documentation

## 2015-04-25 HISTORY — DX: Headache, unspecified: R51.9

## 2015-04-25 HISTORY — DX: Headache: R51

## 2015-04-25 LAB — MAGNESIUM: MAGNESIUM: 1.8 mg/dL (ref 1.7–2.4)

## 2015-04-25 LAB — BASIC METABOLIC PANEL
Anion gap: 13 (ref 5–15)
Anion gap: 8 (ref 5–15)
BUN: 11 mg/dL (ref 6–20)
BUN: 9 mg/dL (ref 6–20)
CALCIUM: 8.8 mg/dL — AB (ref 8.9–10.3)
CO2: 25 mmol/L (ref 22–32)
CO2: 24 mmol/L (ref 22–32)
CREATININE: 0.84 mg/dL (ref 0.44–1.00)
CREATININE: 0.62 mg/dL (ref 0.44–1.00)
Calcium: 9.3 mg/dL (ref 8.9–10.3)
Chloride: 97 mmol/L — ABNORMAL LOW (ref 101–111)
Chloride: 105 mmol/L (ref 101–111)
GFR calc Af Amer: >60 mL/min (ref >60)
GLUCOSE: 126 mg/dL — AB (ref 65–99)
Glucose, Bld: 86 mg/dL (ref 65–99)
POTASSIUM: 2.6 mmol/L — AB (ref 3.5–5.1)
Potassium: 4.3 mmol/L (ref 3.5–5.1)
SODIUM: 137 mmol/L (ref 135–145)
Sodium: 135 mmol/L (ref 135–145)

## 2015-04-25 LAB — RAPID URINE DRUG SCREEN, HOSP PERFORMED
Amphetamines: NOT DETECTED
BENZODIAZEPINES: NOT DETECTED
Barbiturates: NOT DETECTED
COCAINE: NOT DETECTED
OPIATES: NOT DETECTED
Tetrahydrocannabinol: NOT DETECTED

## 2015-04-25 LAB — CBC WITH DIFFERENTIAL/PLATELET
Basophils Absolute: 0 10*3/uL (ref 0.0–0.1)
Basophils Relative: 0 %
EOS PCT: 1 %
Eosinophils Absolute: 0.1 10*3/uL (ref 0.0–0.7)
HCT: 40 % (ref 36.0–46.0)
Hemoglobin: 13.4 g/dL (ref 12.0–15.0)
LYMPHS ABS: 1.5 10*3/uL (ref 0.7–4.0)
LYMPHS PCT: 18 %
MCH: 30.1 pg (ref 26.0–34.0)
MCHC: 33.5 g/dL (ref 30.0–36.0)
MCV: 89.9 fL (ref 78.0–100.0)
MONO ABS: 0.6 10*3/uL (ref 0.1–1.0)
MONOS PCT: 7 %
Neutro Abs: 6.1 10*3/uL (ref 1.7–7.7)
Neutrophils Relative %: 74 %
PLATELETS: 212 10*3/uL (ref 150–400)
RBC: 4.45 MIL/uL (ref 3.87–5.11)
RDW: 12.8 % (ref 11.5–15.5)
WBC: 8.2 10*3/uL (ref 4.0–10.5)

## 2015-04-25 LAB — URINALYSIS, ROUTINE W REFLEX MICROSCOPIC
BILIRUBIN URINE: NEGATIVE
GLUCOSE, UA: NEGATIVE mg/dL
Hgb urine dipstick: NEGATIVE
Ketones, ur: NEGATIVE mg/dL
Leukocytes, UA: NEGATIVE
NITRITE: NEGATIVE
PH: 7.5 (ref 5.0–8.0)
Protein, ur: NEGATIVE mg/dL
SPECIFIC GRAVITY, URINE: 1.015 (ref 1.005–1.030)

## 2015-04-25 LAB — I-STAT CG4 LACTIC ACID, ED: Lactic Acid, Venous: 3.68 mmol/L (ref 0.5–2.0)

## 2015-04-25 LAB — LACTIC ACID, PLASMA: LACTIC ACID, VENOUS: 0.8 mmol/L (ref 0.5–2.0)

## 2015-04-25 LAB — ETHANOL: Alcohol, Ethyl (B): <5 mg/dL (ref <5)

## 2015-04-25 MED ORDER — SODIUM CHLORIDE 0.9 % IV SOLN
INTRAVENOUS | Status: DC
  Administered 2015-04-25: 125 mL/h via INTRAVENOUS
  Administered 2015-04-25: 19:00:00 via INTRAVENOUS

## 2015-04-25 MED ORDER — POTASSIUM CHLORIDE CRYS ER 20 MEQ PO TBCR
60.0000 meq | EXTENDED_RELEASE_TABLET | Freq: Once | ORAL | Status: AC
  Administered 2015-04-25: 60 meq via ORAL
  Filled 2015-04-25: qty 3

## 2015-04-25 MED ORDER — AMLODIPINE BESYLATE 5 MG PO TABS
5.0000 mg | ORAL_TABLET | Freq: Every day | ORAL | Status: DC
  Administered 2015-04-25 – 2015-04-26 (×2): 5 mg via ORAL
  Filled 2015-04-25 (×2): qty 1

## 2015-04-25 MED ORDER — LOPERAMIDE HCL 2 MG PO TABS
2.0000 mg | ORAL_TABLET | Freq: Four times a day (QID) | ORAL | Status: DC | PRN
  Filled 2015-04-25: qty 1

## 2015-04-25 MED ORDER — ASPIRIN EC 81 MG PO TBEC
81.0000 mg | DELAYED_RELEASE_TABLET | Freq: Every day | ORAL | Status: DC
  Administered 2015-04-25 – 2015-04-26 (×2): 81 mg via ORAL
  Filled 2015-04-25 (×2): qty 1

## 2015-04-25 MED ORDER — MELOXICAM 7.5 MG PO TABS
7.5000 mg | ORAL_TABLET | Freq: Every day | ORAL | Status: DC
  Administered 2015-04-25 – 2015-04-26 (×2): 7.5 mg via ORAL
  Filled 2015-04-25 (×3): qty 1

## 2015-04-25 MED ORDER — ONDANSETRON HCL 4 MG PO TABS: 4.0000 mg | ORAL_TABLET | Freq: Four times a day (QID) | ORAL | Status: DC | PRN

## 2015-04-25 MED ORDER — LISINOPRIL 20 MG PO TABS
20.0000 mg | ORAL_TABLET | Freq: Every day | ORAL | Status: DC
  Administered 2015-04-25 – 2015-04-26 (×2): 20 mg via ORAL
  Filled 2015-04-25 (×2): qty 1

## 2015-04-25 MED ORDER — AMLODIPINE BESYLATE 5 MG PO TABS: 5.0000 mg | ORAL_TABLET | Freq: Every day | ORAL | Status: DC

## 2015-04-25 MED ORDER — ONDANSETRON HCL 4 MG/2ML IJ SOLN: 4.0000 mg | Freq: Four times a day (QID) | INTRAMUSCULAR | Status: DC | PRN

## 2015-04-25 MED ORDER — ACETAMINOPHEN 650 MG RE SUPP: 650.0000 mg | Freq: Four times a day (QID) | RECTAL | Status: DC | PRN

## 2015-04-25 MED ORDER — KETOROLAC TROMETHAMINE 30 MG/ML IJ SOLN
30.0000 mg | Freq: Once | INTRAMUSCULAR | Status: AC
  Administered 2015-04-25: 30 mg via INTRAVENOUS
  Filled 2015-04-25: qty 1

## 2015-04-25 MED ORDER — ENOXAPARIN SODIUM 40 MG/0.4ML ~~LOC~~ SOLN
40.0000 mg | SUBCUTANEOUS | Status: DC
  Administered 2015-04-25: 40 mg via SUBCUTANEOUS
  Filled 2015-04-25 (×2): qty 0.4

## 2015-04-25 MED ORDER — SODIUM CHLORIDE 0.9% FLUSH
3.0000 mL | Freq: Two times a day (BID) | INTRAVENOUS | Status: DC
  Administered 2015-04-25: 3 mL via INTRAVENOUS

## 2015-04-25 MED ORDER — METOCLOPRAMIDE HCL 5 MG/ML IJ SOLN
10.0000 mg | Freq: Once | INTRAMUSCULAR | Status: DC
  Filled 2015-04-25: qty 2

## 2015-04-25 MED ORDER — ACETAMINOPHEN 325 MG PO TABS: 650.0000 mg | ORAL_TABLET | Freq: Four times a day (QID) | ORAL | Status: DC | PRN

## 2015-04-25 MED ORDER — PREDNISONE 5 MG PO TABS
5.0000 mg | ORAL_TABLET | Freq: Every day | ORAL | Status: DC
  Administered 2015-04-26: 5 mg via ORAL
  Filled 2015-04-25 (×2): qty 1

## 2015-04-25 MED ORDER — POTASSIUM CHLORIDE 10 MEQ/100ML IV SOLN
10.0000 meq | INTRAVENOUS | Status: AC
  Administered 2015-04-25 (×2): 10 meq via INTRAVENOUS
  Filled 2015-04-25 (×2): qty 100

## 2015-04-25 MED ORDER — LEVETIRACETAM 500 MG PO TABS: 500.0000 mg | ORAL_TABLET | Freq: Two times a day (BID) | ORAL | Status: DC

## 2015-04-25 MED ORDER — SODIUM CHLORIDE 0.9 % IV SOLN
1000.0000 mg | Freq: Once | INTRAVENOUS | Status: AC
  Administered 2015-04-25: 1000 mg via INTRAVENOUS
  Filled 2015-04-25: qty 10

## 2015-04-25 MED ORDER — LEVETIRACETAM 500 MG PO TABS
500.0000 mg | ORAL_TABLET | Freq: Two times a day (BID) | ORAL | Status: DC
  Administered 2015-04-25 – 2015-04-26 (×3): 500 mg via ORAL
  Filled 2015-04-25 (×5): qty 1

## 2015-04-25 NOTE — ED Notes (Signed)
Patient transported to CT 
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
## 2015-04-25 NOTE — ED Notes (Signed)
Attempted to call report - was advised by secretary to call RN back in 10 min.

## 2015-04-25 NOTE — ED Notes (Signed)
Pt being transported to 5M12 via stretcher w/telemetry.

## 2015-04-25 NOTE — ED Notes (Signed)
The pt now remembers that she is in [place]  C/o a headache.  Hx of headaches
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
## 2015-04-25 NOTE — Discharge Instructions (Signed)
Seizure, Adult Ms. Faughnan, you likely had a seizure today at home.  You were given medication in the ED and you need to continue this medicine until you see neurology within 3 days for close follow up.  If symptoms worsen, come back to the ED immediately.  Thank you. A seizure means there is unusual activity in the brain. A seizure can cause changes in attention or behavior. Seizures often cause shaking (convulsions). Seizures often last from 30 seconds to 2 minutes. HOME CARE   If you are given medicines, take them exactly as told by your doctor.  Keep all doctor visits as told.  Do not swim or drive until your doctor says it is okay.  Teach others what to do if you have a seizure. They should:  Lay you on the ground.  Put a cushion under your head.  Loosen any tight clothing around your neck.  Turn you on your side.  Stay with you until you get better. GET HELP RIGHT AWAY IF:   The seizure lasts longer than 2 to 5 minutes.  The seizure is very bad.  The person does not wake up after the seizure.  The person's attention or behavior changes. Drive the person to the emergency room or call your local emergency services (911 in [place] MAKE SURE YOU:   Understand these instructions.  Will watch your condition.  Will get help right away if you are not doing well or get worse.   This information is not intended to replace advice given to you by your health care provider. Make sure you discuss any questions you have with your health care provider.   Document Released: 08/30/2007 Document Revised: 06/05/2011 Document Reviewed: 10/23/2012 Elsevier Interactive Patient Education [employer].

## 2015-04-25 NOTE — ED Notes (Signed)
The pt is unable to void at present but she wants to stay on the bedpan

## 2015-04-25 NOTE — ED Notes (Signed)
Assisted to bedside commode-- pt is weak, needs 2 assist to transfer-- pt is unsteady on feet. Pt did not know where she was, reoriented. Pt's daughter came into room. Admitting dr at bedside.

## 2015-04-25 NOTE — ED Notes (Signed)
Thew pt knows she is in a hospital  She thinks she is in [employer].  Her son at the bedside reports that she moved here 4 years ago. She has  Been being worked up for some staring episodes.  She was even on seizure med for awhile  But she stopped taking that because did not like the way it made her fell.  So far she has  Been worked up with all types of tests and nothing has been found .  Tonight she was witnessed jerking all over her body then she fell out of the bed

## 2015-04-25 NOTE — ED Notes (Signed)
Port chest x-ray done

## 2015-04-25 NOTE — ED Notes (Signed)
Family at bedside. 

## 2015-04-25 NOTE — ED Notes (Signed)
Pt took meds w/o difficulty.  

## 2015-04-25 NOTE — ED Notes (Signed)
C/o a severe headache  kcl iv started

## 2015-04-26 ENCOUNTER — Inpatient Hospital Stay: Admission: RE | Admit: 2015-04-26 | Payer: Medicare Other | Source: Ambulatory Visit

## 2015-04-26 DIAGNOSIS — E872 Acidosis: Secondary | ICD-10-CM | POA: Diagnosis not present

## 2015-04-26 DIAGNOSIS — I10 Essential (primary) hypertension: Secondary | ICD-10-CM

## 2015-04-26 DIAGNOSIS — G40409 Other generalized epilepsy and epileptic syndromes, not intractable, without status epilepticus: Secondary | ICD-10-CM

## 2015-04-26 DIAGNOSIS — D860 Sarcoidosis of lung: Secondary | ICD-10-CM

## 2015-04-26 DIAGNOSIS — R51 Headache: Secondary | ICD-10-CM

## 2015-04-26 DIAGNOSIS — E876 Hypokalemia: Secondary | ICD-10-CM | POA: Diagnosis not present

## 2015-04-26 LAB — BASIC METABOLIC PANEL
ANION GAP: 11 (ref 5–15)
BUN: 9 mg/dL (ref 6–20)
CHLORIDE: 107 mmol/L (ref 101–111)
CO2: 20 mmol/L — ABNORMAL LOW (ref 22–32)
Calcium: 9 mg/dL (ref 8.9–10.3)
Creatinine, Ser: 0.74 mg/dL (ref 0.44–1.00)
GFR calc Af Amer: >60 mL/min (ref >60)
GFR calc non Af Amer: >60 mL/min (ref >60)
Glucose, Bld: 80 mg/dL (ref 65–99)
POTASSIUM: 4.8 mmol/L (ref 3.5–5.1)
SODIUM: 138 mmol/L (ref 135–145)

## 2015-04-26 LAB — GLUCOSE, CAPILLARY
GLUCOSE-CAPILLARY: 76 mg/dL (ref 65–99)
GLUCOSE-CAPILLARY: 84 mg/dL (ref 65–99)

## 2015-04-26 MED ORDER — LEVETIRACETAM 500 MG PO TABS: 500.0000 mg | ORAL_TABLET | Freq: Two times a day (BID) | ORAL | Status: DC

## 2015-04-26 MED ORDER — LISINOPRIL 20 MG PO TABS: 20.0000 mg | ORAL_TABLET | Freq: Every day | ORAL | Status: DC

## 2015-04-26 MED ORDER — PREDNISONE 5 MG PO TABS: 5.0000 mg | ORAL_TABLET | Freq: Every day | ORAL | Status: DC

## 2015-04-26 NOTE — Progress Notes (Signed)
Two failed attempts to gain IV access. Order for IV consult entered. Encouraged Pt to increase h20 intake, Pt v/u and compliant.
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
## 2015-04-26 NOTE — Care Management Obs Status (Signed)
[facility] NOTIFICATION   Patient Details  Name: Alicia Brennan MRN: [id number] Date of Birth: 1950/09/24   Medicare Observation Status Notification Given:  Yes    Pollie Friar, RN  [phone] 04/26/2015, 11:58 AM

## 2015-04-26 NOTE — Progress Notes (Signed)
Discharge instructions reviewed with patient/family. RXs given. All questions answered at this time. Pt is waiting for rolling walker. Transport provided by family.   Ave Filter, RN

## 2015-04-26 NOTE — Evaluation (Signed)
Physical Therapy Evaluation Patient Details Name: Alicia Brennan MRN: [id number] DOB: Aug 11, 1950 Today's Date: 04/26/2015   History of Present Illness  The patient is a 65 y.o. year-old female with history of anal cancer, hypertension, sarcoidosis, frequent headaches and memory loss since a fall with concussion and "bleeding on the brain" in 2010. She has frequent staring spells since the incident per her daughter. She has been seen by Neurology at [facility] for these events.On day of admission, she had a seizure around 4am. Her daughter reports that she heard a thump and ran to her mother's room where her mother was on the floor with full body jerking, blood and spit coming from her mouth and urinary incontinence. Lasted a few minutes and resolved spontaneously.  Clinical Impression   Pt admitted with above diagnosis. Pt currently with functional limitations due to the deficits listed below (see PT Problem List). Very much hopes to dc hoem, and appropriate for dc home from functional mobility standpoint; will follow while in-house;  Pt will benefit from skilled PT to increase their independence and safety with mobility to allow discharge to the venue listed below.       Follow Up Recommendations Outpatient PT;Supervision - Intermittent    Equipment Recommendations  Rolling walker with 5" wheels    Recommendations for Other Services       Precautions / Restrictions Precautions Precautions: Fall Precaution Comments: Fall risk greatly reduced with use of RW      Mobility  Bed Mobility                  Transfers Overall transfer level: Needs assistance Equipment used: None Transfers: Sit to/from Stand Sit to Stand: Min guard         General transfer comment: Heavy reliance on armrests to push up; Painful L hip at initial stance, so brought RW to her  Ambulation/Gait Ambulation/Gait assistance: Supervision Ambulation Distance (Feet): 120 Feet Assistive device: Rolling  walker (2 wheeled) Gait Pattern/deviations: Step-through pattern     General Gait Details: Overall managing with RW well, using it to unweigh painful L hip in stance; good RW control  Stairs Stairs: Yes Stairs assistance: Min guard Stair Management: One rail Left;Step to pattern;Sideways Number of Stairs: 2 General stair comments: demonstrated technqiue then pt managed stairs with L rail well  Wheelchair Mobility    Modified Rankin (Stroke Patients Only)       Balance                                             Pertinent Vitals/Pain Pain Assessment: 0-10 Pain Score: 8  Pain Location: L hip with Weight Bearing Pain Descriptors / Indicators: Aching;Grimacing Pain Intervention(s): Monitored during session;Other (comment) (Instructed pt to support weight on RW during L stance)    Home Living Family/patient expects to be discharged to:: Private residence Living Arrangements: Children Available Help at Discharge: Family;Available PRN/intermittently Type of Home: House Home Access: Stairs to enter Entrance Stairs-Rails: Left Entrance Stairs-Number of Steps: 2 Home Layout: Two level;Bed/bath upstairs Home Equipment: Walker - standard      Prior Function Level of Independence: Independent;Independent with assistive device(s)         Comments: Reports she still gets out in the community     Hand Dominance        Extremity/Trunk Assessment   Upper Extremity Assessment: Overall [facility] for tasks  assessed           Lower Extremity Assessment: LLE deficits/detail   LLE Deficits / Details: Painful with Weight Bearing; pt attributes this to arthritis     Communication   Communication: No difficulties;Other (comment) (tangential conversation)  Cognition Arousal/Alertness: Awake/alert Behavior During Therapy: WFL for tasks assessed/performed Overall Cognitive Status: Within Functional Limits for tasks assessed (for simple mobility tasks; very  distractible and tangential in conversation)       Memory:  (Repeated herself quite frequently)              General Comments      Exercises        Assessment/Plan    PT Assessment Patient needs continued PT services  PT Diagnosis Difficulty walking;Acute pain   PT Problem List Decreased strength;Decreased range of motion;Decreased activity tolerance;Decreased balance;Decreased mobility;Decreased knowledge of use of DME;Pain  PT Treatment Interventions DME instruction;Gait training;Stair training;Functional mobility training;Therapeutic activities;Therapeutic exercise;Patient/family education;Cognitive remediation   PT Goals (Current goals can be found in the Care Plan section) Acute Rehab PT Goals Patient Stated Goal: Hopes to go hoem today PT Goal Formulation: With patient Time For Goal Achievement: 05/10/15 Potential to Achieve Goals: Good    Frequency Min 3X/week   Barriers to discharge        Co-evaluation               End of Session Equipment Utilized During Treatment: Gait belt Activity Tolerance: Patient tolerated treatment well Patient left: in chair;with call bell/phone within reach;with chair alarm set (eating lunch) Nurse Communication: Mobility status    Functional Assessment Tool Used: Clinical Judgement Functional Limitation: Mobility: Walking and moving around Mobility: Walking and Moving Around Current Status [id number]): At least 1 percent but less than 20 percent impaired, limited or restricted Mobility: Walking and Moving Around Goal Status [phone]): 0 percent impaired, limited or restricted    Time: 1221-1241 PT Time Calculation (min) (ACUTE ONLY): 20 min   Charges:   PT Evaluation $PT Eval Low Complexity: 1 Procedure     PT G Codes:   PT G-Codes **NOT FOR INPATIENT CLASS** Functional Assessment Tool Used: Clinical Judgement Functional Limitation: Mobility: Walking and moving around Mobility: Walking and Moving Around Current  Status [id number]): At least 1 percent but less than 20 percent impaired, limited or restricted Mobility: Walking and Moving Around Goal Status [phone]): 0 percent impaired, limited or restricted    Roney Marion Hamff 04/26/2015, 1:41 PM  Roney Marion, [place] Pager [phone] Office [phone]

## 2015-04-26 NOTE — Care Management Note (Addendum)
Case Management Note  Patient Details  Name: Alicia Brennan MRN: [id number] Date of Birth: 11-11-1950  Subjective/Objective:                    Action/Plan: Plan is for patient to discharge home with family. Order placed for rolling walker. CM notified Jermaine with [employer] and he is going to deliver the equipment to the room. Bedside RN updated.  Addendum: Dr Sheran Fava ordered outpatient therapy. CM met with the patient and her daughter and they are interested in [facility] in [place]. Patient given the map and phone number to [facility] and information placed on the AVS.   Expected Discharge Date:                  Expected Discharge Plan:  Home/Self Care  In-House Referral:     Discharge planning Services  CM Consult  Post Acute Care Choice:  Durable Medical Equipment Choice offered to:  Patient  DME Arranged:  Walker rolling DME Agency:  [place]:    [facility] Agency:     Status of Service:  Completed, signed off  Medicare Important Message Given:    Date Medicare IM Given:    Medicare IM give by:    Date Additional Medicare IM Given:    Additional Medicare Important Message give by:     If discussed at [place] of Stay Meetings, dates discussed:    Additional Comments:  Pollie Friar, RN 04/26/2015, 3:47 PM

## 2015-04-27 ENCOUNTER — Ambulatory Visit (INDEPENDENT_AMBULATORY_CARE_PROVIDER_SITE_OTHER): Payer: Medicare Other | Admitting: Neurology

## 2015-04-27 ENCOUNTER — Encounter: Payer: Self-pay | Admitting: Neurology

## 2015-04-27 VITALS — BP 140/92 | HR 97 | Wt 152.0 lb

## 2015-04-27 DIAGNOSIS — G40009 Localization-related (focal) (partial) idiopathic epilepsy and epileptic syndromes with seizures of localized onset, not intractable, without status epilepticus: Secondary | ICD-10-CM | POA: Insufficient documentation

## 2015-04-27 MED ORDER — LEVETIRACETAM 500 MG PO TABS: 500.0000 mg | ORAL_TABLET | Freq: Two times a day (BID) | ORAL | Status: DC
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
## 2015-04-27 NOTE — Patient Instructions (Signed)
1. Continue Keppra 500mg : Take 1 tablet twice a day 2. Schedule 24-hour EEG 3. Follow-up in 2 months, call for any further staring episodes, we may need to increase your dose  Seizure Precautions 1. If medication has been prescribed for you to prevent seizures, take it exactly as directed.  Do not stop taking the medicine without talking to your doctor first, even if you have not had a seizure in a long time.   2. Avoid activities in which a seizure would cause danger to yourself or to others.  Don't operate dangerous machinery, swim alone, or climb in high or dangerous places, such as on ladders, roofs, or girders.  Do not drive unless your doctor says you may.  3. If you have any warning that you may have a seizure, lay down in a safe place where you can't hurt yourself.    4.  No driving for 6 months from last seizure, as per [facility].   Please refer to the following link on the [place] website for more information: http://[email or web address]/[email or web address]/drivingu.cfm   5.  Maintain good sleep hygiene. Avoid alcohol.  6.  Contact your doctor if you have any problems that may be related to the medicine you are taking.  7.  Call 911 and bring the patient back to the ED if:        A.  The seizure lasts longer than 5 minutes.       B.  The patient doesn't awaken shortly after the seizure  C.  The patient has new problems such as difficulty seeing, speaking or moving  D.  The patient was injured during the seizure  E.  The patient has a temperature over 102 F (39C)  F.  The patient vomited and now is having trouble breathing

## 2015-05-03 ENCOUNTER — Encounter: Payer: Self-pay | Admitting: Pulmonary Disease

## 2015-05-03 ENCOUNTER — Ambulatory Visit (INDEPENDENT_AMBULATORY_CARE_PROVIDER_SITE_OTHER): Payer: Medicare Other | Admitting: Pulmonary Disease

## 2015-05-03 VITALS — BP 130/66 | HR 95 | Ht 61.0 in | Wt 153.2 lb

## 2015-05-03 DIAGNOSIS — D860 Sarcoidosis of lung: Secondary | ICD-10-CM | POA: Diagnosis not present

## 2015-05-03 DIAGNOSIS — R911 Solitary pulmonary nodule: Secondary | ICD-10-CM

## 2015-05-03 NOTE — Assessment & Plan Note (Signed)
Discuss with PCP about changing from lisinopril to other medication (such as Losartan ) that does not cause cough

## 2015-05-03 NOTE — Assessment & Plan Note (Signed)
Schedule CT scan of chest & breathing test (PFTs) Please find the name of your lung doctor/ office in [place] & all [place] back so we can obtain your old scan/PFTs Consider decreasing prednisone in the future

## 2015-05-03 NOTE — Assessment & Plan Note (Signed)
Full CT chest -  Clarify whether any other nodules present obtain prior CT records, if any Favor sarcoid in this non smoker
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
## 2015-05-03 NOTE — Patient Instructions (Signed)
Schedule CT scan of chest & breathing test (PFTs) Please find the name of your lung doctor/ office in [place] & all [place] back so we can obtain your old scan/PFTs  Discuss with PCP about changing from lisinopril to other medication (such as Losartan ) that does not cause cough

## 2015-05-10 ENCOUNTER — Ambulatory Visit (INDEPENDENT_AMBULATORY_CARE_PROVIDER_SITE_OTHER)
Admission: RE | Admit: 2015-05-10 | Discharge: 2015-05-10 | Disposition: A | Discharge status: Home / Self Care | Payer: Medicare Other | Source: Ambulatory Visit | Attending: Pulmonary Disease | Admitting: Pulmonary Disease

## 2015-05-10 DIAGNOSIS — D860 Sarcoidosis of lung: Secondary | ICD-10-CM | POA: Diagnosis not present

## 2015-05-13 ENCOUNTER — Telehealth: Payer: Self-pay | Admitting: Pulmonary Disease

## 2015-05-13 DIAGNOSIS — R911 Solitary pulmonary nodule: Secondary | ICD-10-CM

## 2015-05-13 NOTE — Telephone Encounter (Signed)
Called spoke with pt. She has been trying to track down her records from [place] but so far her records have been destroyed. She has not been able to find ant former PFT's/CT's. I advised will let Dr. Elsworth Soho know.

## 2015-05-13 NOTE — Telephone Encounter (Signed)
Pt returning call.Alicia Brennan ° °

## 2015-05-13 NOTE — Telephone Encounter (Signed)
Called and spoke with the pt. Reviewed results and recs. She states she is in the process of getting her records released from the pulmonologist in [place] and once she receives the records she will contact our office. I informed her that I was placing the CT in 3 months and closer to that date someone from our [facility] will contact her to get that scheduled. She voiced understanding and had no further questions. Order has been placed. Nothing further needed.

## 2015-05-13 NOTE — Telephone Encounter (Signed)
Notes Recorded by Rigoberto Noel, MD on 05/10/2015 at 7:11 PM 25mm nodule is still present Recomm -Fu CT no contrast in 3 months Does she have name of her pulmonologist in [place]- so we cn get old CT/ PFTs ---  ATC, NA--received VM but it is full. WCB

## 2015-05-19 ENCOUNTER — Ambulatory Visit (INDEPENDENT_AMBULATORY_CARE_PROVIDER_SITE_OTHER): Payer: Medicare Other | Admitting: Neurology

## 2015-05-19 DIAGNOSIS — G40009 Localization-related (focal) (partial) idiopathic epilepsy and epileptic syndromes with seizures of localized onset, not intractable, without status epilepticus: Secondary | ICD-10-CM | POA: Diagnosis not present

## 2015-05-28 ENCOUNTER — Telehealth: Payer: Self-pay | Admitting: Neurology

## 2015-05-28 NOTE — Telephone Encounter (Signed)
Called patient. Advised her to call her pharmacy for rx refill. 1 year Rx for her Keppra was sent to her pharmacy in January.

## 2015-05-28 NOTE — Telephone Encounter (Signed)
Pt needs a refill on her seizures medication please call in to [facility] on [address]  Pt call back number [phone] she said she could not tell me the name but spelled it and it was the keppra 5 mg

## 2015-06-01 ENCOUNTER — Telehealth: Payer: Self-pay | Admitting: Family Medicine

## 2015-06-01 NOTE — Telephone Encounter (Signed)
-----   Message from Cameron Sprang, MD sent at 06/01/2015  8:56 AM EST ----- Pls let her know EEG is normal. Continue same dose Keppra 500mg  twice a day. Thanks

## 2015-06-01 NOTE — Telephone Encounter (Signed)
No answer, mailbox full

## 2015-06-01 NOTE — Procedures (Signed)
ELECTROENCEPHALOGRAM REPORT  Dates of Recording: 05/19/2015 to 05/20/2015  Patient's Name: Alicia Brennan MRN: [id number] Date of Birth: [birth year]  Referring Provider: Dr. Ellouise Newer  Procedure: 24-hour ambulatory EEG  History: This is a 65 year old woman with new onset convulsion. Report of staring and unresponsive episodes over the past year. EEG for classification.  Medications:  Keppra, Tylenol, Norvasc, ASA,Lisinopril, Imodium A-D, Deltasone  Technical Summary: This is a 24-hour multichannel digital EEG recording measured by the international 10-20 system with electrodes applied with paste and impedances below 5000 ohms performed as portable with EKG monitoring.  The digital EEG was referentially recorded, reformatted, and digitally filtered in a variety of bipolar and referential montages for optimal display.    DESCRIPTION OF RECORDING: During maximal wakefulness, the background activity consisted of a symmetric 9-10 Hz posterior dominant rhythm which was reactive to eye opening.  There were no epileptiform discharges or focal slowing seen in wakefulness.  During the recording, the patient progresses through wakefulness, drowsiness, and Stage 2 sleep.  Again, there were no epileptiform discharges seen.  Events: On 02/22/at 1518 hours, she reports changing in emotions then a headache at 1525 hours. Electrographically, there were no EEG or EKG changes seen.  On 02/22/at 2116 hours, she reports a headache. Electrographically, there were no EEG or EKG changes seen.  There were no electrographic seizures seen.  EKG lead was unremarkable.  IMPRESSION: This 24-hour ambulatory EEG study is normal.    CLINICAL CORRELATION: A normal EEG does not exclude a clinical diagnosis of epilepsy. Typical events were not captured. Episodes of change in emotion and headaches did not show EEG change.  If further clinical questions remain, inpatient video EEG monitoring may be helpful.   Ellouise Newer, M.D.

## 2015-06-01 NOTE — Telephone Encounter (Signed)
Tried calling patient again, no answer. Mailbox full.

## 2015-06-03 NOTE — Telephone Encounter (Signed)
Result letter mailed

## 2015-06-14 ENCOUNTER — Ambulatory Visit: Payer: Medicare Other | Admitting: Neurology

## 2015-06-21 ENCOUNTER — Ambulatory Visit (INDEPENDENT_AMBULATORY_CARE_PROVIDER_SITE_OTHER): Payer: Medicare Other | Admitting: Neurology

## 2015-06-21 ENCOUNTER — Encounter: Payer: Self-pay | Admitting: Neurology

## 2015-06-21 VITALS — BP 118/76 | HR 88 | Resp 16 | Wt 149.0 lb

## 2015-06-21 DIAGNOSIS — G40009 Localization-related (focal) (partial) idiopathic epilepsy and epileptic syndromes with seizures of localized onset, not intractable, without status epilepticus: Secondary | ICD-10-CM

## 2015-06-21 MED ORDER — LEVETIRACETAM ER 500 MG PO TB24: ORAL_TABLET | ORAL | Status: DC

## 2015-06-21 NOTE — Patient Instructions (Signed)
1. Stop your current Keppra prescription. Your new prescription for Keppra ER 500mg : Take 2 tablets at night  2. Keep a journal of your symptoms 3. Follow-up in 6 months  Seizure Precautions: 1. If medication has been prescribed for you to prevent seizures, take it exactly as directed.  Do not stop taking the medicine without talking to your doctor first, even if you have not had a seizure in a long time.   2. Avoid activities in which a seizure would cause danger to yourself or to others.  Don't operate dangerous machinery, swim alone, or climb in high or dangerous places, such as on ladders, roofs, or girders.  Do not drive unless your doctor says you may.  3. If you have any warning that you may have a seizure, lay down in a safe place where you can't hurt yourself.    4.  No driving for 6 months from last seizure, as per [facility].   Please refer to the following link on the [place] website for more information: http://www.epilepsyfoundation.org/answerplace/Social/driving/drivingu.cfm   5.  Maintain good sleep hygiene. Avoid alcohol.  6.  Contact your doctor if you have any problems that may be related to the medicine you are taking.  7.  Call 911 and bring the patient back to the ED if:        A.  The seizure lasts longer than 5 minutes.       B.  The patient doesn't awaken shortly after the seizure  C.  The patient has new problems such as difficulty seeing, speaking or moving  D.  The patient was injured during the seizure  E.  The patient has a temperature over 102 F (39C)  F.  The patient vomited and now is having trouble breathing

## 2015-06-21 NOTE — Progress Notes (Signed)
NEUROLOGY FOLLOW UP OFFICE NOTE  Frann Slutz [id number]  HISTORY OF PRESENT ILLNESS: I had the pleasure of seeing Alicia Brennan in follow-up in the neurology clinic on 06/21/2015.  She is again accompanied by her daughter who helps supplement the history today. The patient was last seen 2 months ago after new onset convulsion on 04/25/15. On further questioning, she had been having episodes of staring and unresponsiveness with hand automatisms and lip smacking over the past year. MRI brain, routine EEG unremarkable. She was started on Keppra 500mg  BID. She had a 24-hour EEG which was normal, typical events were not captured. Her daughter denies any further episodes of staring/unresponsiveness since starting the Keppra. No further convulsions since January 2017. She is overall tolerating Keppra except for drowsiness. She has several bodily complaints today, including left hip pain, teeth pain, lymph node under her left mandible, and concern for UTI. She has been having occasional headaches and numbness in both hands. Her BP has been running low. She reports some cognitive changes, she would be told by family that she said something but would not recall doing this.  HPI: This is a pleasant 65 yo RH woman with a history of hypertension, pulmonary sarcoidosis, anal cancer, with new onset convulsion on 04/25/2015. She has no recollection of events and had a seizure in her sleep, her daughter woke up at 4am hearing her fall on the floor with full body jerking, blood and spit coming out of her mouth, with urinary incontinence. Seizure lasted a few minutes and she was sleepy on EMS arrival. She was admitted to [facility] where she had a normal CBC, BMP showed a potassium of 2.6, glucose 126, chloride 97, elevated lactic acid 3.68. UDS and EtOH level negative. I personally reviewed head CT which did not show any acute changes, there was mild diffuse atrophy. She was discharged home on Keppra 500mg  BID. It has only  been 2 days, but so far she is tolerating the medication.  The patient and her son report that she has been having episodes of staring and unresponsiveness for over a year, she would have hand automatisms ("playing with her fingers") and possibly lip smacking and ?head turn to the right. Her son has only witnessed one episode, she lives with her daughter who sees them more, occurring around twice a week or more. They last only a couple of minutes. They report that she had some staring episodes the day and evening prior to the convulsion. She reports that these can be preceded by feeling sick in her stomach and "chills." She has noticed gaps in time herself, but when family is calling her after an episode, she would be unaware and ask what was wrong. She denies any olfactory/gustatory hallucinations, deja vu, focal numbness/tingling/weakness, myoclonic jerks. She had seen neurologist Dr. Melrose Nakayama in August 2016 for frequent headaches and staring off. There is an MRI brain without contrast report from 06/30/14 which did not show any acute changes, moderate for age nonspecific signal changes in the cerebral white matter, most commonly due to chronic small vessel disease, multilevel severe cervical spine disc and endplate degeneration with suspected spinal stenosis. MRI C-spine done 07/17/14 reported marked multilevel degenerative disc disease with moderate central canal stenosis at levels C3-4, C4-5, C6-7 and severe right neural foraminal narrowing at C4-5. Her routine wake and sleep EEG done 11/25/14 was normal. She was started on nortriptyline for the frequent headaches but has not been taking this regularly. She denies any frequent headaches today, stating  headaches and dizziness are infrequent and occur mostly related to her BP. She denies any diplopia, dysarthria, dysphagia, neck/back pain. She has chronic diarrhea since the diagnosis of anal cancer. She feels her memory is "not too good," she would get lost driving  to her son's house in the past year. She moved to [facility] in 2012 and lives with her daughter, she denies any missed bills. She states she does not forget her medications but instead does not like to take them. Her mother and maternal aunt had memory changes.   Epilepsy Risk Factors: She reports having a fall in 2010 with "bleeding in the brain." No neurosurgical procedures done. Otherwise she had a normal birth and early development. There is no history of febrile convulsions, CNS infections such as meningitis/encephalitis, or family history of seizures.  PAST MEDICAL HISTORY: Past Medical History  Diagnosis Date  . IBS (irritable bowel syndrome)   . Cancer [facility]) 2008    anal  . Sarcoidosis of lung ([place])   . Hypertension   . Recurrent headache   . Leg fracture, left     MEDICATIONS: Current Outpatient Prescriptions on File Prior to Visit  Medication Sig Dispense Refill  . acetaminophen (TYLENOL) 500 MG tablet Take 1,000 mg by mouth every 8 (eight) hours as needed for moderate pain.    [name] amLODipine (NORVASC) 5 MG tablet Take 5 mg by mouth daily.    [name] aspirin EC 81 MG tablet Take 81 mg by mouth daily.    [name] levETIRAcetam (KEPPRA) 500 MG tablet Take 1 tablet (500 mg total) by mouth 2 (two) times daily. 60 tablet 11  . lisinopril (PRINIVIL,ZESTRIL) 20 MG tablet Take 1 tablet (20 mg total) by mouth daily. 30 tablet 0  . loperamide (IMODIUM A-D) 2 MG tablet Take 2 mg by mouth 4 (four) times daily as needed for diarrhea or loose stools.    . predniSONE (DELTASONE) 5 MG tablet Take 1 tablet (5 mg total) by mouth daily with breakfast. 30 tablet 0   No current facility-administered medications on file prior to visit.    ALLERGIES: No Known Allergies  FAMILY HISTORY: Family History  Problem Relation Age of Onset  . Dementia Mother   . High blood pressure Father   . Dementia Maternal Aunt   . Sarcoidosis Son   . Seizures Neg Hx     SOCIAL HISTORY: Social History   Social History  .  Marital Status: Single    Spouse Name: N/A  . Number of Children: 3  . Years of Education: N/A   Occupational History  . Retired    Social History Main Topics  . Smoking status: Never Smoker   . Smokeless tobacco: Never Used  . Alcohol Use: No  . Drug Use: No  . Sexual Activity: Not on file   Other Topics Concern  . Not on file   Social History Narrative    REVIEW OF SYSTEMS: Constitutional: No fevers, chills, or sweats, no generalized fatigue, change in appetite Eyes: No visual changes, double vision, eye pain Ear, nose and throat: No hearing loss, ear pain, nasal congestion, sore throat Cardiovascular: No chest pain, palpitations Respiratory:  No shortness of breath at rest or with exertion, wheezes GastrointestinaI: No nausea, vomiting, diarrhea, abdominal pain, fecal incontinence Genitourinary:  + dysuria, no urinary retention or frequency Musculoskeletal:  No neck pain, back pain, +left hip pain Integumentary: No rash, pruritus, skin lesions Neurological: as above Psychiatric: No depression, insomnia, anxiety Endocrine: No palpitations, fatigue, diaphoresis,  mood swings, change in appetite, change in weight, increased thirst Hematologic/Lymphatic:  No anemia, purpura, petechiae. Allergic/Immunologic: no itchy/runny eyes, nasal congestion, recent allergic reactions, rashes  PHYSICAL EXAM: Filed Vitals:   06/21/15 1119  BP: 118/76  Pulse: 88   General: No acute distress Head:  Normocephalic/atraumatic Neck: supple, no paraspinal tenderness, full range of motion Heart:  Regular rate and rhythm Lungs:  Clear to auscultation bilaterally Back: No paraspinal tenderness Skin/Extremities: No rash, no edema Neurological Exam: alert and oriented to person, place, and time. No aphasia or dysarthria. Fund of knowledge is appropriate.  Recent and remote memory are intact.  Attention and concentration are normal.    Able to name objects and repeat phrases. Cranial nerves:  Pupils equal, round, reactive to light. Extraocular movements intact with no nystagmus. Visual fields full. Facial sensation intact. No facial asymmetry. Tongue, uvula, palate midline.  Motor: Bulk and tone normal, muscle strength 5/5 throughout with no pronator drift.  Sensation to light touch intact.  No extinction to double simultaneous stimulation.  Deep tendon reflexes 2+ throughout, toes downgoing.  Finger to nose testing intact.  Gait narrow-based and steady, able to tandem walk adequately.  Romberg negative.  IMPRESSION: This is a pleasant 65 yo RH woman with a history of hypertension, pulmonary sarcoidosis, with new onset generalized tonic-clonic seizure on 04/25/2015. 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 MRI brain done in April 2016 was reported as unremarkable, routine and 24-hour EEG normal. No further convulsions or focal seizures since starting Keppra. She is feeling drowsy on Keppra 500mg  BID and will try taking Levetiracetam ER 1000mg  qhs to see if this helps with daytime drowsiness. [place] driving laws were again discussed with the patient, and she knows to stop driving after a seizure, until 6 months seizure-free. Family will keep a calendar of her symptoms, she will follow-up in 6 months and knows to call our office for any recurrent staring episodes, at which point Keppra dose will be increased.   Thank you for allowing me to participate in her care.  Please do not hesitate to call for any questions or concerns.  The duration of this appointment visit was 24 minutes of face-to-face time with the patient.  Greater than 50% of this time was spent in counseling, explanation of diagnosis, planning of further management, and coordination of care.   Alicia Brennan, M.D.   CC: Dr. Brunetta Genera

## 2015-06-25 ENCOUNTER — Ambulatory Visit: Payer: Medicare Other | Admitting: Neurology

## 2015-08-03 ENCOUNTER — Encounter (HOSPITAL_COMMUNITY): Payer: Self-pay

## 2015-08-03 ENCOUNTER — Emergency Department (HOSPITAL_COMMUNITY): Payer: Medicare Other

## 2015-08-03 ENCOUNTER — Emergency Department (HOSPITAL_COMMUNITY)
Admission: EM | Admit: 2015-08-03 | Discharge: 2015-08-03 | Disposition: A | Discharge status: Home / Self Care | Payer: Medicare Other | Attending: Emergency Medicine | Admitting: Emergency Medicine

## 2015-08-03 DIAGNOSIS — Z8781 Personal history of (healed) traumatic fracture: Secondary | ICD-10-CM | POA: Diagnosis not present

## 2015-08-03 DIAGNOSIS — K002 Abnormalities of size and form of teeth: Secondary | ICD-10-CM | POA: Insufficient documentation

## 2015-08-03 DIAGNOSIS — K029 Dental caries, unspecified: Secondary | ICD-10-CM | POA: Diagnosis not present

## 2015-08-03 DIAGNOSIS — M25562 Pain in left knee: Secondary | ICD-10-CM

## 2015-08-03 DIAGNOSIS — I10 Essential (primary) hypertension: Secondary | ICD-10-CM | POA: Insufficient documentation

## 2015-08-03 DIAGNOSIS — M25552 Pain in left hip: Secondary | ICD-10-CM | POA: Insufficient documentation

## 2015-08-03 DIAGNOSIS — Z8719 Personal history of other diseases of the digestive system: Secondary | ICD-10-CM | POA: Diagnosis not present

## 2015-08-03 DIAGNOSIS — Z79899 Other long term (current) drug therapy: Secondary | ICD-10-CM | POA: Diagnosis not present

## 2015-08-03 DIAGNOSIS — Z791 Long term (current) use of non-steroidal anti-inflammatories (NSAID): Secondary | ICD-10-CM | POA: Diagnosis not present

## 2015-08-03 DIAGNOSIS — Z7982 Long term (current) use of aspirin: Secondary | ICD-10-CM | POA: Insufficient documentation

## 2015-08-03 DIAGNOSIS — Z7952 Long term (current) use of systemic steroids: Secondary | ICD-10-CM | POA: Insufficient documentation

## 2015-08-03 DIAGNOSIS — Z862 Personal history of diseases of the blood and blood-forming organs and certain disorders involving the immune mechanism: Secondary | ICD-10-CM | POA: Insufficient documentation

## 2015-08-03 DIAGNOSIS — Z85048 Personal history of other malignant neoplasm of rectum, rectosigmoid junction, and anus: Secondary | ICD-10-CM | POA: Insufficient documentation

## 2015-08-03 LAB — URINALYSIS, ROUTINE W REFLEX MICROSCOPIC
GLUCOSE, UA: NEGATIVE mg/dL
HGB URINE DIPSTICK: NEGATIVE
KETONES UR: NEGATIVE mg/dL
Leukocytes, UA: NEGATIVE
Nitrite: NEGATIVE
PH: 5.5 (ref 5.0–8.0)
PROTEIN: NEGATIVE mg/dL
Specific Gravity, Urine: 1.027 (ref 1.005–1.030)

## 2015-08-03 MED ORDER — PHENAZOPYRIDINE HCL 200 MG PO TABS: 200.0000 mg | ORAL_TABLET | Freq: Three times a day (TID) | ORAL | Status: DC

## 2015-08-03 NOTE — ED Provider Notes (Signed)
CSN: [id number]     Arrival date & time 08/03/15  1449 History   First MD Initiated Contact with Patient 08/03/15 1614     Chief Complaint  Patient presents with  . Knee Pain   (Consider location/radiation/quality/duration/timing/severity/associated sxs/prior Treatment) HPI 65 y.o. female with a hx of HTN, presents to the Emergency Department today complaining of left knee swelling since Easter. States pain is 10/10 and throbbing. States she has had pain in that area x 1 year. Of note, pt has been being seen by Dr. Lyla Glassing due to left hip pain. Pt states that she needs to have a hip replacement done, but they have delayed surgery due to patient needing her teeth fixed. Has scheduled appointment tomorrow morning with her dentist to fix this problem.  Pt also notes increase in urinary frequency as well as dysuria recently. No blood in urine. Notes similar to previous UTIs in past. No fevers. No CP/SOB/ABD pain. No headache. No N/V/D.      Past Medical History  Diagnosis Date  . IBS (irritable bowel syndrome)   . Cancer [facility]) 2008    anal  . Sarcoidosis of lung ([place])   . Hypertension   . Recurrent headache   . Leg fracture, left    Past Surgical History  Procedure Laterality Date  . Eye surgery      r/t sarcoidisis  . Cholecystectomy    . Carpal tunnel release Bilateral   . Orif elbow fracture Left    Family History  Problem Relation Age of Onset  . Dementia Mother   . High blood pressure Father   . Dementia Maternal Aunt   . Sarcoidosis Son   . Seizures Neg Hx    Social History  Substance Use Topics  . Smoking status: Never Smoker   . Smokeless tobacco: Never Used  . Alcohol Use: No   OB History    No data available     Review of Systems ROS reviewed and all are negative for acute change except as noted in the HPI.  Allergies  Review of patient's allergies indicates no known allergies.  Home Medications   Prior to Admission medications   Medication Sig Start  Date End Date Taking? Authorizing Provider  amLODipine (NORVASC) 10 MG tablet Take 10 mg by mouth daily.    Historical Provider, MD  aspirin EC 81 MG tablet Take 81 mg by mouth daily.    Historical Provider, MD  ergocalciferol (VITAMIN D2) 50000 units capsule Take 50,000 Units by mouth once a week.    Historical Provider, MD  ibuprofen (ADVIL,MOTRIN) 200 MG tablet Take by mouth as needed for moderate pain. 600mg -800mg  as needed for hip pain    Historical Provider, MD  levETIRAcetam (KEPPRA XR) 500 MG 24 hr tablet Take 2 tablets at night 06/21/15   Cameron Sprang, MD  lisinopril-hydrochlorothiazide (PRINZIDE,ZESTORETIC) 20-25 MG tablet Take 1 tablet by mouth daily.    Historical Provider, MD  loperamide (IMODIUM A-D) 2 MG tablet Take 2 mg by mouth 4 (four) times daily as needed for diarrhea or loose stools.    Historical Provider, MD  meloxicam (MOBIC) 15 MG tablet Take 15 mg by mouth daily.    Historical Provider, MD  predniSONE (DELTASONE) 5 MG tablet Take 1 tablet (5 mg total) by mouth daily with breakfast. 04/26/15   Janece Canterbury, MD   BP 153/82 mmHg  Pulse 72  Temp(Src) 98 F (36.7 C) (Oral)  Resp 18  Ht 5\' 4"  (1.626 m)  Wt 67.586 kg  BMI 25.56 kg/m2  SpO2 100%   Physical Exam  Constitutional: She is oriented to person, place, and time. She appears well-developed and well-nourished.  HENT:  Head: Normocephalic and atraumatic.  Mouth/Throat: Uvula is midline, oropharynx is clear and moist and mucous membranes are normal. No trismus in the jaw. Abnormal dentition. Dental caries present. No dental abscesses or uvula swelling. No oropharyngeal exudate, posterior oropharyngeal edema, posterior oropharyngeal erythema or tonsillar abscesses.  Eyes: EOM are normal. Pupils are equal, round, and reactive to light.  Neck: Normal range of motion.  Cardiovascular: Normal rate and regular rhythm.   Pulmonary/Chest: Effort normal.  Abdominal: Soft. Normal appearance and bowel sounds are normal.  There is no tenderness. There is no rigidity, no rebound, no guarding, no tenderness at McBurney's point and negative Murphy's sign.  Musculoskeletal:       Left knee: She exhibits decreased range of motion.  Negative anterior/poster drawer bilaterally. Negative ballottement test. No varus or valgus laxity. Pain with flexion or extension. No TTP of knees or ankles. Crepitus noted. Neurovascularly intact.    Neurological: She is alert and oriented to person, place, and time.  Skin: Skin is warm and dry.  Psychiatric: She has a normal mood and affect. Her behavior is normal. Thought content normal.  Nursing note and vitals reviewed.  ED Course  Procedures (including critical care time) Labs Review Labs Reviewed  URINALYSIS, ROUTINE W REFLEX MICROSCOPIC (NOT AT [facility]) - Abnormal; Notable for the following:    Color, Urine AMBER (*)    Bilirubin Urine SMALL (*)    All other components within normal limits   Imaging Review Dg Knee Complete 4 Views Left  08/03/2015  CLINICAL DATA:  Pain and swelling in the entire left knee. Unable to bear weight. Surgery 10 years ago. EXAM: LEFT KNEE - COMPLETE 4+ VIEW COMPARISON:  None. FINDINGS: Prior surgery to the proximal tibia. There is a lateral plate with screw fixation involving the tibial plateau and proximal tibia. In addition, there is a striated dense structure along the lateral aspect of the proximal tibia which may be related to a bone plug or graft. The most proximal screw extends through the medial cortex of the proximal tibia. There is sclerosis and irregularity along the lateral tibial plateau compatible with an old injury. Difficult to exclude small loose bodies in this area. There is bony overgrowth adjacent to the proximal aspect of the surgical hardware. Proximal fibula appears to be intact. Spurring and degenerative changes at the patellofemoral compartment. No significant knee joint effusion. IMPRESSION: Irregularity of the tibial plateau related  to old trauma and surgical fixation. No acute bone abnormality. No significant joint effusion. Electronically Signed   By: Markus Daft M.D.   On: 08/03/2015 17:24   I have personally reviewed and evaluated these images and lab results as part of my medical decision-making.   EKG Interpretation None      MDM  I have reviewed and evaluated the relevant laboratory values I have reviewed and evaluated the relevant imaging studies.  I have reviewed the relevant previous healthcare records. I obtained HPI from historian. Patient discussed with supervising physician  ED Course:  Assessment: Pt is a 49yF with hx HTN who presents with left knee swelling since easter and pain in that knee x 1 year. Pt also noted Dysuria for the past few days with increase in frequency. On exam, pt in NAD. Nontoxic/nonseptic appearing. VSS. Afebrile. Lungs CTA. Heart RRR. Abdomen nontender soft.  Left knee neurovascularly intact. Limited ROM. Crepitus noted. No signs of erythema or infection. UA unremarkable. XR with no acute abnormalities. Pt has scheduled appointment with dentist tomorrow. Plan is to DC home with follow up to Ortho (Dr. Lyla Glassing). Will treat urine with Pyridium . At time of discharge, Patient is in no acute distress. Vital Signs are stable. Patient is able to ambulate. Patient able to tolerate PO.    Disposition/Plan:  DC Home Additional Verbal discharge instructions given and discussed with patient.  Pt Instructed to f/u with PCP in the next week for evaluation and treatment of symptoms. Return precautions given Pt acknowledges and agrees with plan  Supervising Physician Daleen Bo, MD   Final diagnoses:  Left knee pain  Dental caries      Shary Decamp, PA-C 08/03/15 [place], MD 08/03/15 [phone]

## 2015-08-03 NOTE — Discharge Instructions (Signed)
Please read and follow all provided instructions.  Your diagnoses today include:  1. Left knee pain   2. Dental caries    Tests performed today include:  Vital signs. See below for your results today.   Medications prescribed:   Take as prescribed   Home care instructions:  Follow any educational materials contained in this packet.  Follow-up instructions: Please follow-up with Orthopedics for further evaluation of symptoms and treatment   Return instructions:   Please return to the Emergency Department if you do not get better, if you get worse, or new symptoms OR  - Fever (temperature greater than 101.48F)  - Bleeding that does not stop with holding pressure to the area    -Severe pain (please note that you may be more sore the day after your accident)  - Chest Pain  - Difficulty breathing  - Severe nausea or vomiting  - Inability to tolerate food and liquids  - Passing out  - Skin becoming red around your wounds  - Change in mental status (confusion or lethargy)  - New numbness or weakness     Please return if you have any other emergent concerns.  Additional Information:  Your vital signs today were: BP 153/82 mmHg   Pulse 72   Temp(Src) 98 F (36.7 C) (Oral)   Resp 18   Ht 5\' 4"  (1.626 m)   Wt 67.586 kg   BMI 25.56 kg/m2   SpO2 100% If your blood pressure (BP) was elevated above 135/85 this visit, please have this repeated by your doctor within one month. ---------------

## 2015-08-03 NOTE — ED Notes (Signed)
Pt presents with 1 year h/o L hip and knee pain.  Pt is supposed to have hip replacement but has not had surgery, needs her teeth extracted.  Pt reports since Easter (travelled to [place]) knee has swollen and become more painful.  Pt also reports 5-6 month h/o urinary frequency, reports intermittent dysuria.

## 2015-08-06 ENCOUNTER — Ambulatory Visit (INDEPENDENT_AMBULATORY_CARE_PROVIDER_SITE_OTHER)
Admission: RE | Admit: 2015-08-06 | Discharge: 2015-08-06 | Disposition: A | Discharge status: Home / Self Care | Payer: Medicare Other | Source: Ambulatory Visit | Attending: Pulmonary Disease | Admitting: Pulmonary Disease

## 2015-08-06 DIAGNOSIS — R911 Solitary pulmonary nodule: Secondary | ICD-10-CM | POA: Diagnosis not present

## 2015-08-09 ENCOUNTER — Telehealth: Payer: Self-pay | Admitting: Neurology

## 2015-08-09 NOTE — Telephone Encounter (Signed)
PT called and was asking about papers being sent to release her/Dawn CB# [phone]

## 2015-08-09 NOTE — Telephone Encounter (Signed)
Returned call. Patient states she is trying to get hip replacement surgery scheduled and is going to need surgical clearance from her doctors. I did explain to her that surgeons office usually sends out a surgical clearance form to be filled out by the treating physicians for a specific medical dx. She verbalized good understanding and will have the proper paperwork forwarded to our office.

## 2015-08-11 ENCOUNTER — Ambulatory Visit (INDEPENDENT_AMBULATORY_CARE_PROVIDER_SITE_OTHER): Payer: Medicare Other | Admitting: Pulmonary Disease

## 2015-08-11 ENCOUNTER — Encounter: Payer: Self-pay | Admitting: Pulmonary Disease

## 2015-08-11 VITALS — BP 152/86 | HR 77 | Ht 61.0 in | Wt 142.0 lb

## 2015-08-11 DIAGNOSIS — R911 Solitary pulmonary nodule: Secondary | ICD-10-CM

## 2015-08-11 DIAGNOSIS — I10 Essential (primary) hypertension: Secondary | ICD-10-CM

## 2015-08-11 DIAGNOSIS — D860 Sarcoidosis of lung: Secondary | ICD-10-CM

## 2015-08-11 LAB — PULMONARY FUNCTION TEST
DL/VA % pred: 103 %
DL/VA: 4.56 ml/min/mmHg/L
DLCO COR % PRED: 83 %
DLCO COR: 16.84 ml/min/mmHg
DLCO UNC % PRED: 84 %
DLCO UNC: 16.99 ml/min/mmHg
FEF 25-75 POST: 2.4 L/s
FEF 25-75 PRE: 2.47 L/s
FEF2575-%Change-Post: -2 %
FEF2575-%PRED-PRE: 152 %
FEF2575-%Pred-Post: 147 %
FEV1-%Change-Post: -3 %
FEV1-%PRED-POST: 104 %
FEV1-%PRED-PRE: 107 %
FEV1-POST: 1.75 L
FEV1-Pre: 1.8 L
FEV1FVC-%Change-Post: -1 %
FEV1FVC-%Pred-Pre: 108 %
FEV6-%CHANGE-POST: -1 %
FEV6-%PRED-POST: 101 %
FEV6-%PRED-PRE: 103 %
FEV6-PRE: 2.13 L
FEV6-Post: 2.1 L
FEV6FVC-%PRED-PRE: 104 %
FEV6FVC-%Pred-Post: 104 %
FVC-%Change-Post: -1 %
FVC-%Pred-Post: 97 %
FVC-%Pred-Pre: 99 %
FVC-Post: 2.1 L
FVC-Pre: 2.13 L
POST FEV6/FVC RATIO: 100 %
PRE FEV1/FVC RATIO: 85 %
Post FEV1/FVC ratio: 83 %
Pre FEV6/FVC Ratio: 100 %
RV % pred: 131 %
RV: 2.59 L
TLC % PRED: 105 %
TLC: 4.86 L

## 2015-08-11 MED ORDER — LOSARTAN POTASSIUM 100 MG PO TABS: 100.0000 mg | ORAL_TABLET | Freq: Every day | ORAL | Status: DC

## 2015-08-11 NOTE — Progress Notes (Signed)
PFT done today. 

## 2015-08-11 NOTE — Assessment & Plan Note (Signed)
Your sarcoidosis is stable Lung nodule is unchanged in size Lung function is normal He was cleared for hip surgery Follow-up CT chest without contrast in 4 months

## 2015-08-11 NOTE — Assessment & Plan Note (Signed)
Favor benign or due to sarcoidosis-but no prior CT available for comparison Follow-up CT chest in 4 months
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
## 2015-08-11 NOTE — Patient Instructions (Signed)
Your sarcoidosis is stable Lung nodule is unchanged in size Lung function is normal He was cleared for hip surgery Follow-up CT chest without contrast in 4 months   Stop taking lisinopril   Rx for losartan 100 mg daily instead #30 -check your blood pressure twice a week and if higher than 140/90, contact your PCP Also contact PCP for further refills

## 2015-08-11 NOTE — Assessment & Plan Note (Signed)
Stop taking lisinopril   Rx for losartan 100 mg daily instead #30 -check your blood pressure twice a week and if higher than 140/90, contact your PCP Also contact PCP for further refills

## 2015-08-13 ENCOUNTER — Telehealth: Payer: Self-pay | Admitting: Neurology

## 2015-08-13 NOTE — Telephone Encounter (Signed)
Opened in error

## 2015-08-17 ENCOUNTER — Telehealth: Payer: Self-pay | Admitting: Pulmonary Disease

## 2015-08-17 NOTE — Telephone Encounter (Signed)
Form is in RA's sign stack. He will be in office Friday, 5/26 and will sign then.  ATC x2, VM full. Unable to leave message.  WCB.

## 2015-08-18 NOTE — Telephone Encounter (Signed)
Routing to [place] for follow up.

## 2015-08-18 NOTE — Telephone Encounter (Signed)
Pt cb and i informed her that form was received and will be given to RA on Friday when returns to sign, patient is requesting a call once this has been complete at previous number listed

## 2015-08-20 NOTE — Telephone Encounter (Signed)
Forms signed and faxed.  Nothing further needed.

## 2015-08-25 ENCOUNTER — Ambulatory Visit: Payer: Self-pay | Admitting: Orthopedic Surgery

## 2015-08-31 ENCOUNTER — Emergency Department (HOSPITAL_COMMUNITY)
Admission: EM | Admit: 2015-08-31 | Discharge: 2015-08-31 | Disposition: A | Discharge status: Home / Self Care | Payer: Medicare Other | Attending: Emergency Medicine | Admitting: Emergency Medicine

## 2015-08-31 ENCOUNTER — Other Ambulatory Visit (HOSPITAL_COMMUNITY): Payer: Self-pay | Admitting: *Deleted

## 2015-08-31 ENCOUNTER — Emergency Department (HOSPITAL_COMMUNITY): Payer: Medicare Other

## 2015-08-31 ENCOUNTER — Encounter (HOSPITAL_COMMUNITY): Payer: Self-pay

## 2015-08-31 DIAGNOSIS — E876 Hypokalemia: Secondary | ICD-10-CM | POA: Insufficient documentation

## 2015-08-31 DIAGNOSIS — Z7982 Long term (current) use of aspirin: Secondary | ICD-10-CM | POA: Diagnosis not present

## 2015-08-31 DIAGNOSIS — Y92003 Bedroom of unspecified non-institutional (private) residence as the place of occurrence of the external cause: Secondary | ICD-10-CM | POA: Insufficient documentation

## 2015-08-31 DIAGNOSIS — W19XXXA Unspecified fall, initial encounter: Secondary | ICD-10-CM | POA: Insufficient documentation

## 2015-08-31 DIAGNOSIS — I10 Essential (primary) hypertension: Secondary | ICD-10-CM | POA: Insufficient documentation

## 2015-08-31 DIAGNOSIS — R4020 Unspecified coma: Secondary | ICD-10-CM

## 2015-08-31 DIAGNOSIS — Z85048 Personal history of other malignant neoplasm of rectum, rectosigmoid junction, and anus: Secondary | ICD-10-CM | POA: Diagnosis not present

## 2015-08-31 DIAGNOSIS — Y999 Unspecified external cause status: Secondary | ICD-10-CM | POA: Diagnosis not present

## 2015-08-31 DIAGNOSIS — Y9301 Activity, walking, marching and hiking: Secondary | ICD-10-CM | POA: Insufficient documentation

## 2015-08-31 DIAGNOSIS — R55 Syncope and collapse: Secondary | ICD-10-CM | POA: Insufficient documentation

## 2015-08-31 LAB — COMPREHENSIVE METABOLIC PANEL
ALT: 17 U/L (ref 14–54)
ANION GAP: 7 (ref 5–15)
AST: 23 U/L (ref 15–41)
Albumin: 3.6 g/dL (ref 3.5–5.0)
Alkaline Phosphatase: 120 U/L (ref 38–126)
BUN: 17 mg/dL (ref 6–20)
CHLORIDE: 105 mmol/L (ref 101–111)
CO2: 26 mmol/L (ref 22–32)
Calcium: 8.9 mg/dL (ref 8.9–10.3)
Creatinine, Ser: 0.89 mg/dL (ref 0.44–1.00)
Glucose, Bld: 112 mg/dL — ABNORMAL HIGH (ref 65–99)
POTASSIUM: 3.1 mmol/L — AB (ref 3.5–5.1)
Sodium: 138 mmol/L (ref 135–145)
Total Bilirubin: 0.8 mg/dL (ref 0.3–1.2)
Total Protein: 6.8 g/dL (ref 6.5–8.1)

## 2015-08-31 LAB — URINALYSIS, ROUTINE W REFLEX MICROSCOPIC
Bilirubin Urine: NEGATIVE
GLUCOSE, UA: NEGATIVE mg/dL
HGB URINE DIPSTICK: NEGATIVE
Ketones, ur: NEGATIVE mg/dL
Nitrite: NEGATIVE
Protein, ur: NEGATIVE mg/dL
SPECIFIC GRAVITY, URINE: 1.016 (ref 1.005–1.030)
pH: 6 (ref 5.0–8.0)

## 2015-08-31 LAB — CBC WITH DIFFERENTIAL/PLATELET
BASOS ABS: 0 10*3/uL (ref 0.0–0.1)
Basophils Relative: 0 %
Eosinophils Absolute: 0.2 10*3/uL (ref 0.0–0.7)
Eosinophils Relative: 3 %
HCT: 33.3 % — ABNORMAL LOW (ref 36.0–46.0)
HEMOGLOBIN: 11.3 g/dL — AB (ref 12.0–15.0)
LYMPHS ABS: 1.4 10*3/uL (ref 0.7–4.0)
LYMPHS PCT: 27 %
MCH: 29.9 pg (ref 26.0–34.0)
MCHC: 33.9 g/dL (ref 30.0–36.0)
MCV: 88.1 fL (ref 78.0–100.0)
Monocytes Absolute: 0.6 10*3/uL (ref 0.1–1.0)
Monocytes Relative: 12 %
NEUTROS PCT: 58 %
Neutro Abs: 3 10*3/uL (ref 1.7–7.7)
Platelets: 205 10*3/uL (ref 150–400)
RBC: 3.78 MIL/uL — AB (ref 3.87–5.11)
RDW: 13.6 % (ref 11.5–15.5)
WBC: 5.1 10*3/uL (ref 4.0–10.5)

## 2015-08-31 LAB — URINE MICROSCOPIC-ADD ON

## 2015-08-31 LAB — TROPONIN I

## 2015-08-31 MED ORDER — HYDROCODONE-ACETAMINOPHEN 5-325 MG PO TABS
1.0000 | ORAL_TABLET | Freq: Once | ORAL | Status: AC
  Administered 2015-08-31: 1 via ORAL
  Filled 2015-08-31: qty 1

## 2015-08-31 MED ORDER — POTASSIUM CHLORIDE CRYS ER 20 MEQ PO TBCR
40.0000 meq | EXTENDED_RELEASE_TABLET | Freq: Once | ORAL | Status: AC
  Administered 2015-08-31: 40 meq via ORAL
  Filled 2015-08-31: qty 2

## 2015-08-31 NOTE — Patient Instructions (Signed)
Alicia Brennan  08/31/2015   Your procedure is scheduled on: 09-09-15  Report to [facility]  Entrance take [facility]  elevators to 3rd floor to  [place] at 145 PM  Call this number if you have problems the morning of surgery [phone]   Remember: ONLY 1 PERSON MAY GO WITH YOU TO SHORT STAY TO GET  READY MORNING OF [place].  Do not eat food  :After Midnight, MAY HAVE CLEAR LIQUIDS FROM MIDNIGHT UNTIL 930 AM DAY OF SURGERY, NOTHING BY MOUTH AFTER 930 AM DAY OF SURGERY.     Take these medicines the morning of surgery with A SIP OF WATER: AMLODIPINE (NORVASC)                               You may not have any metal on your body including hair pins and              piercings  Do not wear jewelry, make-up, lotions, powders or perfumes, deodorant             Do not wear nail polish.  Do not shave  48 hours prior to surgery.              Men may shave face and neck.   Do not bring valuables to the hospital. [place].  Contacts, dentures or bridgework may not be worn into surgery.  Leave suitcase in the car. After surgery it may be brought to your room.                 Please read over the following fact sheets you were given: _____________________________________________________________________                CLEAR LIQUID DIET   Foods Allowed                                                                     Foods Excluded  Coffee and tea, regular and decaf                             liquids that you cannot  Plain Jell-O in any flavor                                             see through such as: Fruit ices (not with fruit pulp)                                     milk, soups, orange juice  Iced Popsicles                                    All solid food  Carbonated beverages, regular and diet                                    Cranberry, grape and apple juices Sports drinks like  Gatorade Lightly seasoned clear broth or consume(fat free) Sugar, honey syrup  Sample Menu Breakfast                                Lunch                                     Supper Cranberry juice                    Beef broth                            Chicken broth Jell-O                                     Grape juice                           Apple juice Coffee or tea                        Jell-O                                      Popsicle                                                Coffee or tea                        Coffee or tea  _____________________________________________________________________  [facility] - Preparing for Surgery Before surgery, you can play an important role.  Because skin is not sterile, your skin needs to be as free of germs as possible.  You can reduce the number of germs on your skin by washing with CHG (chlorahexidine gluconate) soap before surgery.  CHG is an antiseptic cleaner which kills germs and bonds with the skin to continue killing germs even after washing. Please DO NOT use if you have an allergy to CHG or antibacterial soaps.  If your skin becomes reddened/irritated stop using the CHG and inform your nurse when you arrive at Short Stay. Do not shave (including legs and underarms) for at least 48 hours prior to the first CHG shower.  You may shave your face/neck. Please follow these instructions carefully:  1.  Shower with CHG Soap the night before surgery and the  morning of Surgery.  2.  If you choose to wash your hair, wash your hair first as usual with your  normal  shampoo.  3.  After you shampoo, rinse your hair and body thoroughly to remove the  shampoo.  4.  Use CHG as you would any other liquid soap.  You can apply chg directly  to the skin and wash                       Gently with a scrungie or clean washcloth.  5.  Apply the CHG Soap to your body ONLY FROM THE NECK DOWN.   Do not use on face/ open                            Wound or open sores. Avoid contact with eyes, ears mouth and genitals (private parts).                       Wash face,  Genitals (private parts) with your normal soap.             6.  Wash thoroughly, paying special attention to the area where your surgery  will be performed.  7.  Thoroughly rinse your body with warm water from the neck down.  8.  DO NOT shower/wash with your normal soap after using and rinsing off  the CHG Soap.                9.  Pat yourself dry with a clean towel.            10.  Wear clean pajamas.            11.  Place clean sheets on your bed the night of your first shower and do not  sleep with pets. Day of Surgery : Do not apply any lotions/deodorants the morning of surgery.  Please wear clean clothes to the hospital/surgery center.  FAILURE TO FOLLOW THESE INSTRUCTIONS MAY RESULT IN THE CANCELLATION OF YOUR SURGERY PATIENT SIGNATURE_________________________________  NURSE SIGNATURE__________________________________  ________________________________________________________________________   Adam Phenix  An incentive spirometer is a tool that can help keep your lungs clear and active. This tool measures how well you are filling your lungs with each breath. Taking long deep breaths may help reverse or decrease the chance of developing breathing (pulmonary) problems (especially infection) following:  A long period of time when you are unable to move or be active. BEFORE THE PROCEDURE   If the spirometer includes an indicator to show your best effort, your nurse or respiratory therapist will set it to a desired goal.  If possible, sit up straight or lean slightly forward. Try not to slouch.  Hold the incentive spirometer in an upright position. INSTRUCTIONS FOR USE   Sit on the edge of your bed if possible, or sit up as far as you can in bed or on a chair.  Hold the incentive spirometer in an upright position.  Breathe out  normally.  Place the mouthpiece in your mouth and seal your lips tightly around it.  Breathe in slowly and as deeply as possible, raising the piston or the ball toward the top of the column.  Hold your breath for 3-5 seconds or for as long as possible. Allow the piston or ball to fall to the bottom of the column.  Remove the mouthpiece from your mouth and breathe out normally.  Rest for a few seconds and repeat Steps 1 through 7 at least 10 times every 1-2 hours when you are awake. Take your time and take a few normal breaths between deep breaths.  The spirometer may include an indicator to  show your best effort. Use the indicator as a goal to work toward during each repetition.  After each set of 10 deep breaths, practice coughing to be sure your lungs are clear. If you have an incision (the cut made at the time of surgery), support your incision when coughing by placing a pillow or rolled up towels firmly against it. Once you are able to get out of bed, walk around indoors and cough well. You may stop using the incentive spirometer when instructed by your caregiver.  RISKS AND COMPLICATIONS  Take your time so you do not get dizzy or light-headed.  If you are in pain, you may need to take or ask for pain medication before doing incentive spirometry. It is harder to take a deep breath if you are having pain. AFTER USE  Rest and breathe slowly and easily.  It can be helpful to keep track of a log of your progress. Your caregiver can provide you with a simple table to help with this. If you are using the spirometer at home, follow these instructions: [place] IF:   You are having difficultly using the spirometer.  You have trouble using the spirometer as often as instructed.  Your pain medication is not giving enough relief while using the spirometer.  You develop fever of 100.5 F (38.1 C) or higher. SEEK IMMEDIATE MEDICAL CARE IF:   You cough up bloody sputum that had  not been present before.  You develop fever of 102 F (38.9 C) or greater.  You develop worsening pain at or near the incision site. MAKE SURE YOU:   Understand these instructions.  Will watch your condition.  Will get help right away if you are not doing well or get worse. Document Released: 07/24/2006 Document Revised: 06/05/2011 Document Reviewed: 09/24/2006 ExitCare Patient Information 2014 ExitCare, [place].   ________________________________________________________________________  WHAT IS A BLOOD TRANSFUSION? Blood Transfusion Information  A transfusion is the replacement of blood or some of its parts. Blood is made up of multiple cells which provide different functions.  Red blood cells carry oxygen and are used for blood loss replacement.  White blood cells fight against infection.  Platelets control bleeding.  Plasma helps clot blood.  Other blood products are available for specialized needs, such as hemophilia or other clotting disorders. BEFORE THE TRANSFUSION  Who gives blood for transfusions?   Healthy volunteers who are fully evaluated to make sure their blood is safe. This is blood bank blood. Transfusion therapy is the safest it has ever been in the practice of medicine. Before blood is taken from a donor, a complete history is taken to make sure that person has no history of diseases nor engages in risky social behavior (examples are intravenous drug use or sexual activity with multiple partners). The donor's travel history is screened to minimize risk of transmitting infections, such as malaria. The donated blood is tested for signs of infectious diseases, such as HIV and hepatitis. The blood is then tested to be sure it is compatible with you in order to minimize the chance of a transfusion reaction. If you or a relative donates blood, this is often done in anticipation of surgery and is not appropriate for emergency situations. It takes many days to process the  donated blood. RISKS AND COMPLICATIONS Although transfusion therapy is very safe and saves many lives, the main dangers of transfusion include:   Getting an infectious disease.  Developing a transfusion reaction. This is an allergic reaction  to something in the blood you were given. Every precaution is taken to prevent this. The decision to have a blood transfusion has been considered carefully by your caregiver before blood is given. Blood is not given unless the benefits outweigh the risks. AFTER THE TRANSFUSION  Right after receiving a blood transfusion, you will usually feel much better and more energetic. This is especially true if your red blood cells have gotten low (anemic). The transfusion raises the level of the red blood cells which carry oxygen, and this usually causes an energy increase.  The nurse administering the transfusion will monitor you carefully for complications. HOME CARE INSTRUCTIONS  No special instructions are needed after a transfusion. You may find your energy is better. Speak with your caregiver about any limitations on activity for underlying diseases you may have. SEEK MEDICAL CARE IF:   Your condition is not improving after your transfusion.  You develop redness or irritation at the intravenous (IV) site. SEEK IMMEDIATE MEDICAL CARE IF:  Any of the following symptoms occur over the next 12 hours:  Shaking chills.  You have a temperature by mouth above 102 F (38.9 C), not controlled by medicine.  Chest, back, or muscle pain.  People around you feel you are not acting correctly or are confused.  Shortness of breath or difficulty breathing.  Dizziness and fainting.  You get a rash or develop hives.  You have a decrease in urine output.  Your urine turns a dark color or changes to pink, red, or brown. Any of the following symptoms occur over the next 10 days:  You have a temperature by mouth above 102 F (38.9 C), not controlled by  medicine.  Shortness of breath.  Weakness after normal activity.  The white part of the eye turns yellow (jaundice).  You have a decrease in the amount of urine or are urinating less often.  Your urine turns a dark color or changes to pink, red, or brown. Document Released: 03/10/2000 Document Revised: 06/05/2011 Document Reviewed: 10/28/2007 [facility] Patient Information 2014 [place], [place].  _______________________________________________________________________

## 2015-08-31 NOTE — ED Notes (Signed)
Pt had a fall, doesn't complain of any injuries, pt does have a hx of seizures but not sure if she had one tonight or not

## 2015-08-31 NOTE — Progress Notes (Signed)
08-31-15 MICRO, UA EPIC CBC WITH DIF 08-31-15 EPIC CMET 08-31-15 EPIC, POTASSIUM LOW WILL REPEAT BMET DAY OF PRE OP CHEST CT 08-06-15 EPIC PULMONARY FUNCTION TEST 08-11-15 EPIC EKG 08-31-15 EPIC DR [facility] DDS DENTAL CLEARANCE ON CHART PULMONARY CLEARANCE DR Elsworth Soho 08-11-15  ON CHART NEURO CLEARANCE DR Delice Lesch 06-21-15 ON CHART DR [place] CLEARANCE 08-06-15  ON CHART

## 2015-08-31 NOTE — Discharge Instructions (Signed)
Please read and follow all provided instructions.  Your diagnoses today include:  1. Loss of consciousness   2. Hypokalemia     Tests performed today include:  Blood counts and electrolytes - shows low potassium  EKG and blood tests for heart muscle damage - negative  Urine test for infection - no infection  Vital signs. See below for your results today.   Medications prescribed:   None  Take any prescribed medications only as directed.  Home care instructions:  Follow any educational materials contained in this packet.  Rest and hydrate well over the next several days.  Follow-up instructions: Please follow-up with your primary care provider in the next 2 days for further evaluation of your symptoms.   Return instructions:   Please return to the Emergency Department if you experience worsening symptoms.   Return with additional episodes of passing out or seizure.  Please return if you have any other emergent concerns.  Additional Information:  Your vital signs today were: BP 132/66 mmHg   Pulse 68   Temp(Src) 98.3 F (36.8 C) (Oral)   Resp 15   Ht 5\' 4"  (1.626 m)   Wt 67.586 kg   BMI 25.56 kg/m2   SpO2 94% If your blood pressure (BP) was elevated above 135/85 this visit, please have this repeated by your doctor within one month. --------------

## 2015-08-31 NOTE — ED Provider Notes (Signed)
7:20 AM Patient handoff from up still PA-C at shift change. Patient with history of seizures, on Keppra, with fall tonight. Questionable seizure versus syncope. Patient was admitted for observation in January for the same. Patient's only complaint was hip pain. X-ray performed which shows osteoarthritis. Patient has upcoming surgery on her hip. Lab tests checked and were remarkable for hypokalemia only. This was repleted with oral potassium in the emergency department. EKG with questionable U-waves.   Patient discussed with and seen by Dr. Florina Ou prior to discharge.   Patient states that she wants to go home. She has family who will check on her today.   BP 132/66 mmHg  Pulse 68  Temp(Src) 98.3 F (36.8 C) (Oral)  Resp 15  Ht 5\' 4"  (1.626 m)  Wt 67.586 kg  BMI 25.56 kg/m2  SpO2 94%  Will d/c to home. Encouraged f/u with PCP.   Carlisle Cater, PA-C 08/31/15 [phone]

## 2015-08-31 NOTE — ED Provider Notes (Signed)
CSN: [id number]     Arrival date & time 08/31/15  0234 History   First MD Initiated Contact with Patient 08/31/15 [phone]     Chief Complaint  Patient presents with  . Fall     (Consider location/radiation/quality/duration/timing/severity/associated sxs/prior Treatment) HPI Comments: Patient with a history of seizures, IBS, sarcoidosis, HTN, presents with syncopal episode this evening. She was in her granddaughter's bedroom sleeping, got up and started walking out of the room when she fell to the ground. The patient does not remember the event. She woke up to paramedics standing over her. She reports a very mild left chest discomfort prior to passing out. She has a history of seizures and does not know if the episode was related to seizure active, although the granddaughter woke when she heard her grandmother fall and states there was no shaking. No tongue biting. Urinary incontinence is unknown as patient wears depends for mild incontinence issues. The patient complains of bilateral hip pain but reports chronic left hip pain with hip surgery pending. She denies headache, neck/chest/extremity pain. She has a history of IBS and reports "usual" abdominal discomfort. No urinary symptoms or fever.  Patient is a 65 y.o. female presenting with fall. The history is provided by the patient and a relative. No language interpreter was used.  Fall This is a new problem. The current episode started today. Pertinent negatives include no chest pain, chills, coughing, fever, headaches, neck pain or vomiting.    Past Medical History  Diagnosis Date  . IBS (irritable bowel syndrome)   . Cancer [facility]) 2008    anal  . Sarcoidosis of lung ([place])   . Hypertension   . Recurrent headache   . Leg fracture, left    Past Surgical History  Procedure Laterality Date  . Eye surgery      r/t sarcoidisis  . Cholecystectomy    . Carpal tunnel release Bilateral   . Orif elbow fracture Left    Family History  Problem  Relation Age of Onset  . Dementia Mother   . High blood pressure Father   . Dementia Maternal Aunt   . Sarcoidosis Son   . Seizures Neg Hx    Social History  Substance Use Topics  . Smoking status: Never Smoker   . Smokeless tobacco: Never Used  . Alcohol Use: No   OB History    No data available     Review of Systems  Constitutional: Negative for fever and chills.  HENT: Negative.   Eyes: Negative for visual disturbance.  Respiratory: Negative.  Negative for cough and shortness of breath.   Cardiovascular: Negative.  Negative for chest pain.  Gastrointestinal: Negative.  Negative for vomiting.  Genitourinary: Negative for dysuria.  Musculoskeletal: Negative for back pain and neck pain.       See HPI.  Skin: Negative.  Negative for wound.  Neurological: Positive for syncope. Negative for headaches.      Allergies  Review of patient's allergies indicates no known allergies.  Home Medications   Prior to Admission medications   Medication Sig Start Date End Date Taking? Authorizing Provider  amLODipine (NORVASC) 10 MG tablet Take 10 mg by mouth daily.   Yes Historical Provider, MD  amoxicillin (AMOXIL) 500 MG tablet Take 500 mg by mouth 4 (four) times daily. FOR DENTAL PROCEDURE   Yes Historical Provider, MD  aspirin EC 81 MG tablet Take 81 mg by mouth daily.   Yes Historical Provider, MD  Cholecalciferol (VITAMIN D3) 50000 units  TABS Take 1 tablet by mouth once a week.   Yes Historical Provider, MD  ibuprofen (ADVIL,MOTRIN) 200 MG tablet Take by mouth as needed for moderate pain. Reported on 08/03/2015   Yes Historical Provider, MD  levETIRAcetam (KEPPRA XR) 500 MG 24 hr tablet Take 2 tablets at night 06/21/15  Yes Cameron Sprang, MD  losartan (COZAAR) 100 MG tablet Take 1 tablet (100 mg total) by mouth daily. 08/11/15  Yes Rigoberto Noel, MD  meloxicam (MOBIC) 15 MG tablet Take 15 mg by mouth daily.   Yes Historical Provider, MD  loperamide (IMODIUM A-D) 2 MG tablet Take 2  mg by mouth 4 (four) times daily as needed for diarrhea or loose stools. Reported on 08/11/2015    Historical Provider, MD  phenazopyridine (PYRIDIUM) 200 MG tablet Take 1 tablet (200 mg total) by mouth 3 (three) times daily. Patient not taking: Reported on 08/11/2015 08/03/15   Shary Decamp, PA-C   BP 157/83 mmHg  Pulse 81  Temp(Src) 98.3 F (36.8 C) (Oral)  Ht 5\' 4"  (1.626 m)  Wt 67.586 kg  BMI 25.56 kg/m2  SpO2 97% Physical Exam  Constitutional: She is oriented to person, place, and time. She appears well-developed and well-nourished.  HENT:  Head: Atraumatic.  Neck: Normal range of motion.  Cardiovascular: Normal rate and regular rhythm.   No murmur heard. Pulmonary/Chest: Effort normal. She has no wheezes. She has no rales. She exhibits no tenderness.  Abdominal: Soft. There is no tenderness.  Musculoskeletal:  Left greater than right hip tenderness. No shortening or rotation of LE's. FROM all extremities. No midline cervical tenderness.   Neurological: She is alert and oriented to person, place, and time. Coordination normal.  Skin: Skin is warm and dry.  Psychiatric: She has a normal mood and affect.    ED Course  Procedures (including critical care time) Labs Review Labs Reviewed  URINE CULTURE  TROPONIN I  COMPREHENSIVE METABOLIC PANEL  CBC WITH DIFFERENTIAL/PLATELET  URINALYSIS, ROUTINE W REFLEX MICROSCOPIC (NOT AT [facility])   Results for orders placed or performed during the hospital encounter of 08/31/15  CBC with Differential  Result Value Ref Range   WBC 5.1 4.0 - 10.5 K/uL   RBC 3.78 (L) 3.87 - 5.11 MIL/uL   Hemoglobin 11.3 (L) 12.0 - 15.0 g/dL   HCT 33.3 (L) 36.0 - 46.0 %   MCV 88.1 78.0 - 100.0 fL   MCH 29.9 26.0 - 34.0 pg   MCHC 33.9 30.0 - 36.0 g/dL   RDW 13.6 11.5 - 15.5 %   Platelets 205 150 - 400 K/uL   Neutrophils Relative % 58 %   Neutro Abs 3.0 1.7 - 7.7 K/uL   Lymphocytes Relative 27 %   Lymphs Abs 1.4 0.7 - 4.0 K/uL   Monocytes Relative 12 %    Monocytes Absolute 0.6 0.1 - 1.0 K/uL   Eosinophils Relative 3 %   Eosinophils Absolute 0.2 0.0 - 0.7 K/uL   Basophils Relative 0 %   Basophils Absolute 0.0 0.0 - 0.1 K/uL     Imaging Review No results found. I have personally reviewed and evaluated these images and lab results as part of my medical decision-making.   EKG Interpretation None      MDM   Final diagnoses:  None    1. Syncopal episode  Presents after passing out on the floor of granddaughter's bedroom. No witnessed seizure activity. Work up pending. Patient care signed out to Alecia Lemming, PA-C, for re-evaluation after  labs completed.     Charlann Lange, PA-C 08/31/15 [id number]  Shanon Rosser, MD 08/31/15 [place], MD 08/31/15 [phone]

## 2015-09-01 LAB — URINE CULTURE

## 2015-09-02 ENCOUNTER — Inpatient Hospital Stay (HOSPITAL_COMMUNITY)
Admission: RE | Admit: 2015-09-02 | Discharge: 2015-09-02 | Disposition: A | Discharge status: Home / Self Care | Payer: Medicare Other | Source: Ambulatory Visit

## 2015-09-06 ENCOUNTER — Telehealth: Payer: Self-pay | Admitting: Neurology

## 2015-09-06 NOTE — Telephone Encounter (Signed)
Please see message from front desk staff. Pt trying to come in sooner for authorization for hip surgery. Limited availability across the board. Please advise.

## 2015-09-06 NOTE — Telephone Encounter (Signed)
OK to put on my schedule, but not sure if it will be sooner than July and Dr. Delice Lesch will be back by then.

## 2015-09-06 NOTE — Telephone Encounter (Signed)
Alicia Brennan 04/01/1950. She is a patient of Dr. Delice Lesch. She is needing to get in sooner. She is scheduled for August and on a wait list. She is needing to have hip surgery and needs the ok from Dr. Delice Lesch regarding her Seizures. I did let her know Dr. Delice Lesch is on maternity leave and the soonest in August.  Her number is [phone] and her daughter is [phone]. Thank you

## 2015-09-07 NOTE — Telephone Encounter (Signed)
No sooner opening for Dr. Posey Pronto, either. Pt is on wait list.

## 2015-09-09 ENCOUNTER — Encounter (HOSPITAL_COMMUNITY): Admission: RE | Payer: Self-pay | Source: Ambulatory Visit

## 2015-09-09 ENCOUNTER — Inpatient Hospital Stay (HOSPITAL_COMMUNITY): Admission: RE | Admit: 2015-09-09 | Payer: Medicare Other | Source: Ambulatory Visit | Admitting: Orthopedic Surgery

## 2015-09-09 SURGERY — ARTHROPLASTY, HIP, TOTAL, ANTERIOR APPROACH
Anesthesia: Spinal | Site: Hip | Laterality: Left

## 2015-10-07 ENCOUNTER — Telehealth: Payer: Self-pay | Admitting: Pulmonary Disease

## 2015-10-07 DIAGNOSIS — I10 Essential (primary) hypertension: Secondary | ICD-10-CM

## 2015-10-07 DIAGNOSIS — D860 Sarcoidosis of lung: Secondary | ICD-10-CM

## 2015-10-07 DIAGNOSIS — R911 Solitary pulmonary nodule: Secondary | ICD-10-CM

## 2015-10-07 MED ORDER — LOSARTAN POTASSIUM 100 MG PO TABS: 100.0000 mg | ORAL_TABLET | Freq: Every day | ORAL | Status: DC

## 2015-10-07 NOTE — Telephone Encounter (Signed)
Called and lmom to make the pt aware of refill called to the pharmacy since she is out of this med.  i advised her that per her last OV note that she will have to have this medication filled by her PCP.  Called to the [facility] in [place] where the pt is currently at with no additional refills.

## 2015-10-13 NOTE — Telephone Encounter (Signed)
[place]andy and [place], Looks like I have a 9am opening for tomorrow, if she wants to come in. Thanks!

## 2015-10-14 ENCOUNTER — Ambulatory Visit: Payer: Medicare Other | Admitting: Neurology

## 2015-10-14 DIAGNOSIS — Z029 Encounter for administrative examinations, unspecified: Secondary | ICD-10-CM

## 2015-10-29 ENCOUNTER — Encounter: Payer: Self-pay | Admitting: Neurology

## 2015-10-29 ENCOUNTER — Ambulatory Visit (INDEPENDENT_AMBULATORY_CARE_PROVIDER_SITE_OTHER): Payer: Medicare Other | Admitting: Neurology

## 2015-10-29 DIAGNOSIS — G40009 Localization-related (focal) (partial) idiopathic epilepsy and epileptic syndromes with seizures of localized onset, not intractable, without status epilepticus: Secondary | ICD-10-CM | POA: Diagnosis not present

## 2015-10-29 MED ORDER — LEVETIRACETAM ER 500 MG PO TB24: ORAL_TABLET | ORAL | 11 refills | Status: DC

## 2015-10-29 NOTE — Progress Notes (Signed)
NEUROLOGY FOLLOW UP OFFICE NOTE  Alicia Brennan HO:[id number]  HISTORY OF PRESENT ILLNESS: I had the pleasure of seeing Alicia Brennan in follow-up in the neurology clinic on 10/29/2015.  She is accompanied by her granddaughter who helps supplement the history today. The patient was last seen 5 months ago after new onset convulsion on 04/25/15. On further questioning, she had been having episodes of staring and unresponsiveness with hand automatisms and lip smacking over the past year. MRI brain, routine and 24-hour EEG unremarkable. She was started on Keppra 500mg  BID. Since her last visit, she has been in significant discomfort due to hip pain. She reports an episode of loss of consciousness last 08/31/15, her granddaughter heard a loud sound and saw her off the bed, no convulsive activity witnessed. She was talking soon after, no focal weakness. No tongue bite, unclear if incontinent as she wears adult diapers. She was brought to [facility] ER, unclear is seizure versus syncope, bloodwork showed a HCT of 33.3, otherwise unremarkable. She is tolerating Keppra XR 1000mg  qhs without side effects. No reports of further staring/unresponsive episodes. She denies any headaches, dizziness, diplopia, dysarthria, dysphagia. She is in a wheelchair today due to hip pain. She obtained neurological clearance for planned hip surgery, but tells me that after the episode last June, she needs another clearance.   HPI: This is a pleasant 65 yo RH woman with a history of hypertension, pulmonary sarcoidosis, anal cancer, with new onset convulsion on 04/25/2015. She has no recollection of events and had a seizure in her sleep, her daughter woke up at 4am hearing her fall on the floor with full body jerking, blood and spit coming out of her mouth, with urinary incontinence. Seizure lasted a few minutes and she was sleepy on EMS arrival. She was admitted to [facility] where she had a normal CBC, BMP showed a potassium of 2.6, glucose 126,  chloride 97, elevated lactic acid 3.68. UDS and EtOH level negative. I personally reviewed head CT which did not show any acute changes, there was mild diffuse atrophy. She was discharged home on Keppra 500mg  BID.  The patient and her son report that she has been having episodes of staring and unresponsiveness for over a year, she would have hand automatisms ("playing with her fingers") and possibly lip smacking and ?head turn to the right. Her son has only witnessed one episode, she lives with her daughter who sees them more, occurring around twice a week or more. They last only a couple of minutes. They report that she had some staring episodes the day and evening prior to the convulsion. She reports that these can be preceded by feeling sick in her stomach and "chills." She has noticed gaps in time herself, but when family is calling her after an episode, she would be unaware and ask what was wrong. She denies any olfactory/gustatory hallucinations, deja vu, focal numbness/tingling/weakness, myoclonic jerks. She had seen neurologist Dr. Melrose Nakayama in August 2016 for frequent headaches and staring off. There is an MRI brain without contrast report from 06/30/14 which did not show any acute changes, moderate for age nonspecific signal changes in the cerebral white matter, most commonly due to chronic small vessel disease, multilevel severe cervical spine disc and endplate degeneration with suspected spinal stenosis. MRI C-spine done 07/17/14 reported marked multilevel degenerative disc disease with moderate central canal stenosis at levels C3-4, C4-5, C6-7 and severe right neural foraminal narrowing at C4-5. Her routine wake and sleep EEG done 11/25/14 was normal. She  was started on nortriptyline for the frequent headaches but has not been taking this regularly. [name]   Epilepsy Risk Factors: She reports having a fall in 2010 with "bleeding in the brain." No neurosurgical procedures done. Otherwise she had a normal birth  and early development. There is no history of febrile convulsions, CNS infections such as meningitis/encephalitis, or family history of seizures.  PAST MEDICAL HISTORY: Past Medical History:  Diagnosis Date  . Cancer [facility]) 2008   anal  . Hypertension   . IBS (irritable bowel syndrome)   . Leg fracture, left   . Recurrent headache   . Sarcoidosis of lung [facility])     MEDICATIONS: Current Outpatient Prescriptions on File Prior to Visit  Medication Sig Dispense Refill  . amLODipine (NORVASC) 10 MG tablet Take 10 mg by mouth daily.    [name] aspirin EC 81 MG tablet Take 81 mg by mouth daily.    . Cholecalciferol (VITAMIN D3) 50000 units TABS Take 50,000 Units by mouth every Monday.     [name] ibuprofen (ADVIL,MOTRIN) 200 MG tablet Take 400 mg by mouth every 6 (six) hours as needed for moderate pain. Reported on 08/03/2015    . levETIRAcetam (KEPPRA XR) 500 MG 24 hr tablet Take 2 tablets at night (Patient taking differently: Take 1,000 mg by mouth at bedtime. ) 60 tablet 11  . loperamide (IMODIUM A-D) 2 MG tablet Take 2 mg by mouth 4 (four) times daily as needed for diarrhea or loose stools. Reported on 08/11/2015    . losartan (COZAAR) 100 MG tablet Take 1 tablet (100 mg total) by mouth daily. 30 tablet 0  . meloxicam (MOBIC) 15 MG tablet Take 15 mg by mouth daily.    . phenazopyridine (PYRIDIUM) 200 MG tablet Take 1 tablet (200 mg total) by mouth 3 (three) times daily. (Patient not taking: Reported on 08/11/2015) 21 tablet 0   No current facility-administered medications on file prior to visit.     ALLERGIES: No Known Allergies  FAMILY HISTORY: Family History  Problem Relation Age of Onset  . Dementia Mother   . High blood pressure Father   . Dementia Maternal Aunt   . Sarcoidosis Son   . Seizures Neg Hx     SOCIAL HISTORY: Social History   Social History  . Marital status: Single    Spouse name: N/A  . Number of children: 3  . Years of education: N/A   Occupational History  .  Retired    Social History Main Topics  . Smoking status: Never Smoker  . Smokeless tobacco: Never Used  . Alcohol use No  . Drug use: No  . Sexual activity: Not on file   Other Topics Concern  . Not on file   Social History Narrative  . No narrative on file    REVIEW OF SYSTEMS: Constitutional: No fevers, chills, or sweats, no generalized fatigue, change in appetite Eyes: No visual changes, double vision, eye pain Ear, nose and throat: No hearing loss, ear pain, nasal congestion, sore throat Cardiovascular: No chest pain, palpitations Respiratory:  No shortness of breath at rest or with exertion, wheezes GastrointestinaI: No nausea, vomiting, diarrhea, abdominal pain, fecal incontinence Genitourinary:  no dysuria, no urinary retention or frequency Musculoskeletal:  No neck pain, back pain, +bilateral hip pain Integumentary: No rash, pruritus, skin lesions Neurological: as above Psychiatric: No depression, insomnia, anxiety Endocrine: No palpitations, fatigue, diaphoresis, mood swings, change in appetite, change in weight, increased thirst Hematologic/Lymphatic:  No anemia, purpura, petechiae. Allergic/Immunologic:  no itchy/runny eyes, nasal congestion, recent allergic reactions, rashes  PHYSICAL EXAM: Vitals:   10/29/15 1516  BP: (!) 142/78  Pulse: 79   General: No acute distress, sitting in wheelchair Head:  Normocephalic/atraumatic Neck: supple, no paraspinal tenderness, full range of motion Heart:  Regular rate and rhythm Lungs:  Clear to auscultation bilaterally Back: No paraspinal tenderness Skin/Extremities: No rash, no edema Neurological Exam: alert and oriented to person, place, and time. No aphasia or dysarthria. Fund of knowledge is appropriate.  Recent and remote memory are intact.  Attention and concentration are normal.    Able to name objects and repeat phrases. Cranial nerves: Pupils equal, round, reactive to light. Extraocular movements intact with no  nystagmus. Visual fields full. Facial sensation intact. No facial asymmetry. Tongue, uvula, palate midline.  Motor: Bulk and tone normal, muscle strength 5/5 throughout with no pronator drift.  Sensation to light touch intact.  No extinction to double simultaneous stimulation.  Deep tendon reflexes 2+ throughout, toes downgoing.  Finger to nose testing intact.  Gait not tested, in pain due to hip pain.  IMPRESSION: This is a pleasant 65 yo RH woman with a history of hypertension, pulmonary sarcoidosis, with new onset generalized tonic-clonic seizure on 04/25/2015. 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 MRI brain done in April 2016 was reported as unremarkable, routine and 24-hour EEG normal. She had an unwitnessed fall out of bed last 08/31/15, unclear if due to seizure, when granddaughter came soon after she heard the fall, no convulsive activity seen. She was back to baseline soon after. Unclear if syncope versus seizure, would increase Keppra XR to 1500mg  qhs. From a neurological standpoint, there is no contraindication to hip surgery. This was discussed with the patient today. She does not drive and is aware of [place] driving laws to stop driving after a seizure/episode of loss of consciousness, until 6 months seizure-free. She will follow-up in 3 months and knows to call for any changes.   Thank you for allowing me to participate in her care.  Please do not hesitate to call for any questions or concerns.  The duration of this appointment visit was 24 minutes of face-to-face time with the patient.  Greater than 50% of this time was spent in counseling, explanation of diagnosis, planning of further management, and coordination of care.   Ellouise Newer, M.D.   CC: Dr. Brunetta Genera

## 2015-10-29 NOTE — Patient Instructions (Signed)
1. Increase Levetiracetam ER 500mg : Take 3 tablets at night 2. You are cleared for hip surgery 3. Follow-up in 3 months  Seizure Precautions: 1. If medication has been prescribed for you to prevent seizures, take it exactly as directed.  Do not stop taking the medicine without talking to your doctor first, even if you have not had a seizure in a long time.   2. Avoid activities in which a seizure would cause danger to yourself or to others.  Don't operate dangerous machinery, swim alone, or climb in high or dangerous places, such as on ladders, roofs, or girders.  Do not drive unless your doctor says you may.  3. If you have any warning that you may have a seizure, lay down in a safe place where you can't hurt yourself.    4.  No driving for 6 months from last seizure, as per [facility].   Please refer to the following link on the [place] website for more information: http://[email or web address]/[email or web address]/drivingu.cfm   5.  Maintain good sleep hygiene. Avoid alcohol.  6.  Contact your doctor if you have any problems that may be related to the medicine you are taking.  7.  Call 911 and bring the patient back to the ED if:        A.  The seizure lasts longer than 5 minutes.       B.  The patient doesn't awaken shortly after the seizure  C.  The patient has new problems such as difficulty seeing, speaking or moving  D.  The patient was injured during the seizure  E.  The patient has a temperature over 102 F (39C)  F.  The patient vomited and now is having trouble breathing

## 2015-11-03 ENCOUNTER — Ambulatory Visit: Payer: Self-pay | Admitting: Orthopedic Surgery

## 2015-11-04 NOTE — Pre-Procedure Instructions (Addendum)
    Alicia Brennan  11/04/2015    Your procedure is scheduled on Monday, August 21.  Report to [facility] Admitting at 5:30  A.M.              Your surgery or procedure is scheduled for 7:30 AM   Call this number if you have problems the morning of [phone]                 For any other questions, please call [phone], Monday - Friday 8 AM - 4 PM.   Remember:  Do not eat food or drink liquids after midnight Sunday, August 20.  Take these medicines the morning of surgery with A SIP OF WATER: amLODipine (NORVASC).               1 Week prior to surgery STOP taking Aspirin , Aspirin Products (Goody Powder, Excedrin Migraine), Ibuprofen (Advil), meloxicam (MOBIC,) Naproxen (Aleve), Vitamins and Herbal Products (ie Fish Oil)   Do not wear jewelry, make-up or nail polish.  Do not wear lotions, powders, or perfumes.    Do not shave 48 hours prior to surgery.    Do not bring valuables to the hospital.  [facility] is not responsible for any belongings or valuables.  Contacts, dentures or bridgework may not be worn into surgery.  Leave your suitcase in the car.  After surgery it may be brought to your room.  For patients admitted to the hospital, discharge time will be determined by your treatment team.  Please read over the following fact sheets that you were given: [facility]- Preparing For Surgery and Patient Instructions for Mupirocin Application, Incentive Spirometry

## 2015-11-05 ENCOUNTER — Encounter (HOSPITAL_COMMUNITY): Payer: Self-pay

## 2015-11-05 ENCOUNTER — Encounter (HOSPITAL_COMMUNITY)
Admission: RE | Admit: 2015-11-05 | Discharge: 2015-11-05 | Disposition: A | Discharge status: Home / Self Care | Payer: Medicare Other | Source: Ambulatory Visit | Attending: Orthopedic Surgery | Admitting: Orthopedic Surgery

## 2015-11-05 DIAGNOSIS — M160 Bilateral primary osteoarthritis of hip: Secondary | ICD-10-CM | POA: Diagnosis not present

## 2015-11-05 DIAGNOSIS — Z01812 Encounter for preprocedural laboratory examination: Secondary | ICD-10-CM | POA: Insufficient documentation

## 2015-11-05 DIAGNOSIS — Z0183 Encounter for blood typing: Secondary | ICD-10-CM | POA: Diagnosis not present

## 2015-11-05 HISTORY — DX: Unspecified convulsions: R56.9

## 2015-11-05 HISTORY — DX: Unspecified osteoarthritis, unspecified site: M19.90

## 2015-11-05 LAB — ABO/RH: ABO/RH(D): O POS

## 2015-11-05 LAB — BASIC METABOLIC PANEL
ANION GAP: 10 (ref 5–15)
BUN: 10 mg/dL (ref 6–20)
CALCIUM: 9.8 mg/dL (ref 8.9–10.3)
CO2: 23 mmol/L (ref 22–32)
CREATININE: 0.77 mg/dL (ref 0.44–1.00)
Chloride: 104 mmol/L (ref 101–111)
GFR calc Af Amer: >60 mL/min (ref >60)
GLUCOSE: 76 mg/dL (ref 65–99)
Potassium: 3.1 mmol/L — ABNORMAL LOW (ref 3.5–5.1)
Sodium: 137 mmol/L (ref 135–145)

## 2015-11-05 LAB — CBC
HCT: 37.5 % (ref 36.0–46.0)
HEMOGLOBIN: 12.4 g/dL (ref 12.0–15.0)
MCH: 29.3 pg (ref 26.0–34.0)
MCHC: 33.1 g/dL (ref 30.0–36.0)
MCV: 88.7 fL (ref 78.0–100.0)
PLATELETS: 241 10*3/uL (ref 150–400)
RBC: 4.23 MIL/uL (ref 3.87–5.11)
RDW: 12.9 % (ref 11.5–15.5)
WBC: 4.4 10*3/uL (ref 4.0–10.5)

## 2015-11-05 LAB — SURGICAL PCR SCREEN
MRSA, PCR: NEGATIVE
STAPHYLOCOCCUS AUREUS: NEGATIVE

## 2015-11-09 ENCOUNTER — Encounter: Payer: Self-pay | Admitting: Obstetrics and Gynecology

## 2015-11-11 ENCOUNTER — Ambulatory Visit: Payer: Self-pay | Admitting: Orthopedic Surgery

## 2015-11-11 NOTE — H&P (Signed)
TOTAL HIP ADMISSION H&P  Patient is admitted for bilaterally total hip arthroplasty.  Subjective:  Chief Complaint: bilaterally hip pain  HPI: Alicia Brennan, 65 y.o. female, has a history of pain and functional disability in the bilaterally hip(s) due to arthritis and patient has failed non-surgical conservative treatments for greater than 12 weeks to include NSAID's and/or analgesics, corticosteriod injections, flexibility and strengthening excercises, supervised PT with diminished ADL's post treatment, use of assistive devices, weight reduction as appropriate and activity modification.  Onset of symptoms was abrupt starting 2 years ago with rapidlly worsening course since that time.The patient noted no past surgery on the bilaterally hip(s).  Patient currently rates pain in the bilaterally hip at 10 out of 10 with activity. Patient has night pain, worsening of pain with activity and weight bearing, pain that interfers with activities of daily living, pain with passive range of motion, crepitus and joint swelling. Patient has evidence of subchondral cysts, subchondral sclerosis, periarticular osteophytes, joint subluxation and joint space narrowing by imaging studies. This condition presents safety issues increasing the risk of falls. There is no current active infection.  Patient Active Problem List   Diagnosis Date Noted  . Solitary pulmonary nodule 05/03/2015  . Localization-related (focal) (partial) idiopathic epilepsy and epileptic syndromes with seizures of localized onset, not intractable, without status epilepticus ([place]) 04/27/2015  . Generalized tonic-clonic seizure ([place]) 04/25/2015  . Cancer ([place])   . Sarcoidosis of lung ([place])   . Hypertension   . Recurrent headache    Past Medical History:  Diagnosis Date  . Arthritis   . Cancer [facility]) 2008   anal  . Hypertension   . IBS (irritable bowel syndrome)   . IBS (irritable bowel syndrome)   . Leg fracture, left   . Recurrent  headache   . Sarcoidosis of lung ([place])   . Seizures ([place])     Past Surgical History:  Procedure Laterality Date  . CARPAL TUNNEL RELEASE Bilateral   . CHOLECYSTECTOMY    . COLONOSCOPY    . EYE SURGERY     r/t sarcoidisis  . ORIF ELBOW FRACTURE Left   . TIBIA FRACTURE SURGERY Left      (Not in a hospital admission) No Known Allergies  Social History  Substance Use Topics  . Smoking status: Never Smoker  . Smokeless tobacco: Never Used  . Alcohol use No    Family History  Problem Relation Age of Onset  . Dementia Mother   . High blood pressure Father   . Dementia Maternal Aunt   . Sarcoidosis Son   . Seizures Neg Hx      Review of Systems  Constitutional: Negative.   HENT: Negative.   Eyes: Negative.   Respiratory: Negative.   Cardiovascular: Negative.   Gastrointestinal: Positive for blood in stool and diarrhea.  Genitourinary: Negative.   Musculoskeletal: Positive for joint pain.  Skin: Negative.   Neurological: Positive for seizures.  Endo/Heme/Allergies: Negative.   Psychiatric/Behavioral: Negative.     Objective:  Physical Exam  Vitals reviewed. Constitutional: She is oriented to person, place, and time. She appears well-developed and well-nourished.  HENT:  Head: Normocephalic and atraumatic.  Eyes: Conjunctivae and EOM are normal. Pupils are equal, round, and reactive to light.  Neck: Normal range of motion. Neck supple.  Cardiovascular: Normal rate, regular rhythm and intact distal pulses.   Respiratory: Breath sounds normal. No respiratory distress.  GI: Soft. Bowel sounds are normal.  Genitourinary:  Genitourinary Comments: deferred  Musculoskeletal:  Right hip: She exhibits decreased range of motion, decreased strength and bony tenderness.       Left hip: She exhibits decreased range of motion, decreased strength and bony tenderness.  Neurological: She is alert and oriented to person, place, and time. She has normal reflexes.  Skin: Skin  is warm and dry.  Psychiatric: She has a normal mood and affect. Her behavior is normal. Judgment and thought content normal.    Vital signs in last 24 hours: @VSRANGES @  Labs:   Estimated body mass index is 27.59 kg/m as calculated from the following:   Height as of 11/05/15: 4\' 11"  (1.499 m).   Weight as of 11/05/15: 62 kg (136 lb 9.6 oz).   Imaging Review Plain radiographs demonstrate severe degenerative joint disease of the right hip(s). The bone quality appears to be poor for age and reported activity level.  Assessment/Plan:  End stage arthritis, bilaterally hip(s)  The patient history, physical examination, clinical judgement of the provider and imaging studies are consistent with end stage degenerative joint disease of the bilaterally hip(s) and total hip arthroplasty is deemed medically necessary. The treatment options including medical management, injection therapy, arthroscopy and arthroplasty were discussed at length. The risks and benefits of total hip arthroplasty were presented and reviewed. The risks due to aseptic loosening, infection, stiffness, dislocation/subluxation,  thromboembolic complications and other imponderables were discussed.  The patient acknowledged the explanation, agreed to proceed with the plan and consent was signed. Patient is being admitted for inpatient treatment for surgery, pain control, PT, OT, prophylactic antibiotics, VTE prophylaxis, progressive ambulation and ADL's and discharge planning.The patient is planning to be discharged home with home health services

## 2015-11-12 MED ORDER — SODIUM CHLORIDE 0.9 % IV SOLN
1000.0000 mg | INTRAVENOUS | Status: AC
  Administered 2015-11-15: 1000 mg via INTRAVENOUS
  Filled 2015-11-12: qty 10

## 2015-11-14 MED ORDER — CEFAZOLIN SODIUM-DEXTROSE 2-4 GM/100ML-% IV SOLN
2.0000 g | INTRAVENOUS | Status: AC
  Administered 2015-11-15: 2 g via INTRAVENOUS
  Filled 2015-11-14: qty 100

## 2015-11-14 MED ORDER — SODIUM CHLORIDE 0.9 % IV SOLN
INTRAVENOUS | Status: DC
  Administered 2015-11-15: 08:00:00 via INTRAVENOUS

## 2015-11-14 MED ORDER — ACETAMINOPHEN 10 MG/ML IV SOLN
1000.0000 mg | INTRAVENOUS | Status: AC
  Administered 2015-11-15: 1000 mg via INTRAVENOUS

## 2015-11-14 NOTE — Anesthesia Preprocedure Evaluation (Addendum)
Anesthesia Evaluation  Patient identified by MRN, date of birth, ID band Patient awake    Reviewed: Allergy & Precautions, NPO status , Patient's Chart, lab work & pertinent test results  History of Anesthesia Complications Negative for: history of anesthetic complications  Airway Mallampati: II  TM Distance: >3 FB Neck ROM: Full    Dental  (+) Dental Advisory Given, Teeth Intact   Pulmonary neg shortness of breath, neg sleep apnea, neg COPD, neg recent URI,  sarcoidosis   Pulmonary exam normal breath sounds clear to auscultation       Cardiovascular hypertension, Pt. on medications (-) angina(-) Past MI, (-) Cardiac Stents, (-) Orthopnea and (-) PND (-) dysrhythmias  Rhythm:Regular Rate:Normal     Neuro/Psych  Headaches, Seizures - (on Keppra; August 31, 2015 was last seizure), Well Controlled,     GI/Hepatic Neg liver ROS, neg GERD  ,IBS   Endo/Other  negative endocrine ROSneg diabetes  Renal/GU negative Renal ROS     Musculoskeletal  (+) Arthritis ,   Abdominal (+) - obese,   Peds  Hematology negative hematology ROS (+)   Anesthesia Other Findings H/o anal cancer in 2008  Reproductive/Obstetrics                           Anesthesia Physical Anesthesia Plan  ASA: III  Anesthesia Plan: General   Post-op Pain Management:    Induction: Intravenous  Airway Management Planned: Oral ETT  Additional Equipment:   Intra-op Plan:   Post-operative Plan: Extubation in OR  Informed Consent: I have reviewed the patients History and Physical, chart, labs and discussed the procedure including the risks, benefits and alternatives for the proposed anesthesia with the patient or authorized representative who has indicated his/her understanding and acceptance.   Dental advisory given  Plan Discussed with:   Anesthesia Plan Comments:        Anesthesia Quick Evaluation

## 2015-11-15 ENCOUNTER — Encounter (HOSPITAL_COMMUNITY)
Admission: RE | Disposition: A | Discharge status: Home Health | Payer: Self-pay | Source: Ambulatory Visit | Attending: Orthopedic Surgery

## 2015-11-15 ENCOUNTER — Inpatient Hospital Stay (HOSPITAL_COMMUNITY): Payer: Medicare Other

## 2015-11-15 ENCOUNTER — Encounter (HOSPITAL_COMMUNITY): Payer: Self-pay | Admitting: Anesthesiology

## 2015-11-15 ENCOUNTER — Inpatient Hospital Stay (HOSPITAL_COMMUNITY)
Admission: RE | Admit: 2015-11-15 | Discharge: 2015-11-18 | DRG: 462 | Disposition: A | Discharge status: Home Health | Payer: Medicare Other | Source: Ambulatory Visit | Attending: Orthopedic Surgery | Admitting: Orthopedic Surgery

## 2015-11-15 ENCOUNTER — Inpatient Hospital Stay (HOSPITAL_COMMUNITY): Payer: Medicare Other | Admitting: Anesthesiology

## 2015-11-15 DIAGNOSIS — M25559 Pain in unspecified hip: Secondary | ICD-10-CM | POA: Diagnosis present

## 2015-11-15 DIAGNOSIS — M879 Osteonecrosis, unspecified: Secondary | ICD-10-CM | POA: Diagnosis present

## 2015-11-15 DIAGNOSIS — D62 Acute posthemorrhagic anemia: Secondary | ICD-10-CM | POA: Diagnosis not present

## 2015-11-15 DIAGNOSIS — Z7982 Long term (current) use of aspirin: Secondary | ICD-10-CM | POA: Diagnosis not present

## 2015-11-15 DIAGNOSIS — M87052 Idiopathic aseptic necrosis of left femur: Secondary | ICD-10-CM

## 2015-11-15 DIAGNOSIS — Z419 Encounter for procedure for purposes other than remedying health state, unspecified: Secondary | ICD-10-CM

## 2015-11-15 DIAGNOSIS — G40909 Epilepsy, unspecified, not intractable, without status epilepticus: Secondary | ICD-10-CM | POA: Diagnosis present

## 2015-11-15 DIAGNOSIS — K589 Irritable bowel syndrome without diarrhea: Secondary | ICD-10-CM | POA: Diagnosis not present

## 2015-11-15 DIAGNOSIS — D860 Sarcoidosis of lung: Secondary | ICD-10-CM | POA: Diagnosis present

## 2015-11-15 DIAGNOSIS — M160 Bilateral primary osteoarthritis of hip: Principal | ICD-10-CM | POA: Diagnosis present

## 2015-11-15 DIAGNOSIS — Z79899 Other long term (current) drug therapy: Secondary | ICD-10-CM

## 2015-11-15 DIAGNOSIS — I10 Essential (primary) hypertension: Secondary | ICD-10-CM | POA: Diagnosis present

## 2015-11-15 DIAGNOSIS — Z09 Encounter for follow-up examination after completed treatment for conditions other than malignant neoplasm: Secondary | ICD-10-CM

## 2015-11-15 DIAGNOSIS — M87051 Idiopathic aseptic necrosis of right femur: Secondary | ICD-10-CM | POA: Diagnosis present

## 2015-11-15 HISTORY — PX: BILATERAL ANTERIOR TOTAL HIP ARTHROPLASTY: SHX5567

## 2015-11-15 LAB — CBC WITH DIFFERENTIAL/PLATELET
Basophils Absolute: 0 10*3/uL (ref 0.0–0.1)
Basophils Relative: 0 %
EOS ABS: 0 10*3/uL (ref 0.0–0.7)
EOS PCT: 0 %
HCT: 30.4 % — ABNORMAL LOW (ref 36.0–46.0)
Hemoglobin: 10.6 g/dL — ABNORMAL LOW (ref 12.0–15.0)
LYMPHS ABS: 0.6 10*3/uL — AB (ref 0.7–4.0)
Lymphocytes Relative: 5 %
MCH: 28.5 pg (ref 26.0–34.0)
MCHC: 34.9 g/dL (ref 30.0–36.0)
MCV: 81.7 fL (ref 78.0–100.0)
MONOS PCT: 6 %
Monocytes Absolute: 0.7 10*3/uL (ref 0.1–1.0)
Neutro Abs: 10.7 10*3/uL — ABNORMAL HIGH (ref 1.7–7.7)
Neutrophils Relative %: 89 %
PLATELETS: 147 10*3/uL — AB (ref 150–400)
RBC: 3.72 MIL/uL — ABNORMAL LOW (ref 3.87–5.11)
RDW: 15.8 % — AB (ref 11.5–15.5)
WBC: 12 10*3/uL — AB (ref 4.0–10.5)

## 2015-11-15 LAB — PREPARE RBC (CROSSMATCH)

## 2015-11-15 SURGERY — ARTHROPLASTY, HIP, BILATERAL, TOTAL, ANTERIOR APPROACH
Anesthesia: General | Site: Hip | Laterality: Bilateral

## 2015-11-15 MED ORDER — SODIUM CHLORIDE 0.9 % IV SOLN
INTRAVENOUS | Status: DC
  Administered 2015-11-15 – 2015-11-16 (×3): via INTRAVENOUS

## 2015-11-15 MED ORDER — CHLORHEXIDINE GLUCONATE 4 % EX LIQD: 60.0000 mL | Freq: Once | CUTANEOUS | Status: DC

## 2015-11-15 MED ORDER — DEXAMETHASONE SODIUM PHOSPHATE 10 MG/ML IJ SOLN
INTRAMUSCULAR | Status: AC
  Filled 2015-11-15: qty 1

## 2015-11-15 MED ORDER — FENTANYL CITRATE (PF) 100 MCG/2ML IJ SOLN
INTRAMUSCULAR | Status: AC
  Filled 2015-11-15: qty 4

## 2015-11-15 MED ORDER — PHENOL 1.4 % MT LIQD: 1.0000 | OROMUCOSAL | Status: DC | PRN

## 2015-11-15 MED ORDER — SORBITOL 70 % SOLN: 30.0000 mL | Freq: Every day | Status: DC | PRN

## 2015-11-15 MED ORDER — HYDROMORPHONE HCL 1 MG/ML IJ SOLN: 0.5000 mg | INTRAMUSCULAR | Status: DC | PRN

## 2015-11-15 MED ORDER — AMLODIPINE BESYLATE 10 MG PO TABS
10.0000 mg | ORAL_TABLET | Freq: Every day | ORAL | Status: DC
  Administered 2015-11-16 – 2015-11-18 (×3): 10 mg via ORAL
  Filled 2015-11-15 (×3): qty 1

## 2015-11-15 MED ORDER — BUPIVACAINE-EPINEPHRINE 0.5% -1:200000 IJ SOLN
INTRAMUSCULAR | Status: DC | PRN
  Administered 2015-11-15: 7 mL
  Administered 2015-11-15: 30 mL

## 2015-11-15 MED ORDER — LIDOCAINE HCL (CARDIAC) 20 MG/ML IV SOLN
INTRAVENOUS | Status: DC | PRN
  Administered 2015-11-15: 80 mg via INTRAVENOUS

## 2015-11-15 MED ORDER — ACETAMINOPHEN 650 MG RE SUPP: 650.0000 mg | Freq: Four times a day (QID) | RECTAL | Status: DC | PRN

## 2015-11-15 MED ORDER — FENTANYL CITRATE (PF) 100 MCG/2ML IJ SOLN
INTRAMUSCULAR | Status: DC | PRN
  Administered 2015-11-15: 50 ug via INTRAVENOUS
  Administered 2015-11-15: 100 ug via INTRAVENOUS
  Administered 2015-11-15: 50 ug via INTRAVENOUS
  Administered 2015-11-15: 100 ug via INTRAVENOUS
  Administered 2015-11-15 (×2): 50 ug via INTRAVENOUS
  Administered 2015-11-15: 100 ug via INTRAVENOUS

## 2015-11-15 MED ORDER — ALBUMIN HUMAN 5 % IV SOLN
INTRAVENOUS | Status: DC | PRN
  Administered 2015-11-15: 08:00:00 via INTRAVENOUS

## 2015-11-15 MED ORDER — POVIDONE-IODINE 7.5 % EX SOLN
CUTANEOUS | Status: DC | PRN
  Administered 2015-11-15 (×2): 1 via TOPICAL

## 2015-11-15 MED ORDER — ONDANSETRON HCL 4 MG/2ML IJ SOLN
INTRAMUSCULAR | Status: AC
  Filled 2015-11-15: qty 2

## 2015-11-15 MED ORDER — ROCURONIUM BROMIDE 10 MG/ML (PF) SYRINGE
PREFILLED_SYRINGE | INTRAVENOUS | Status: AC
  Filled 2015-11-15: qty 10

## 2015-11-15 MED ORDER — SUGAMMADEX SODIUM 200 MG/2ML IV SOLN
INTRAVENOUS | Status: AC
  Filled 2015-11-15: qty 2

## 2015-11-15 MED ORDER — VITAMIN D3 1.25 MG (50000 UT) PO TABS: 50000.0000 [IU] | ORAL_TABLET | ORAL | Status: DC

## 2015-11-15 MED ORDER — LEVETIRACETAM ER 500 MG PO TB24
1000.0000 mg | ORAL_TABLET | Freq: Every day | ORAL | Status: DC
  Administered 2015-11-15 – 2015-11-17 (×3): 1000 mg via ORAL
  Filled 2015-11-15 (×4): qty 2

## 2015-11-15 MED ORDER — HYDROCODONE-ACETAMINOPHEN 5-325 MG PO TABS
1.0000 | ORAL_TABLET | ORAL | Status: DC | PRN
  Administered 2015-11-15: 1 via ORAL
  Administered 2015-11-16 (×2): 2 via ORAL
  Administered 2015-11-16: 1 via ORAL
  Administered 2015-11-17 – 2015-11-18 (×3): 2 via ORAL
  Filled 2015-11-15 (×3): qty 1
  Filled 2015-11-15 (×2): qty 2
  Filled 2015-11-15: qty 1
  Filled 2015-11-15 (×2): qty 2

## 2015-11-15 MED ORDER — ARTIFICIAL TEARS OP OINT
TOPICAL_OINTMENT | OPHTHALMIC | Status: AC
  Filled 2015-11-15: qty 3.5

## 2015-11-15 MED ORDER — FENTANYL CITRATE (PF) 100 MCG/2ML IJ SOLN: 25.0000 ug | INTRAMUSCULAR | Status: DC | PRN

## 2015-11-15 MED ORDER — ONDANSETRON HCL 4 MG/2ML IJ SOLN
4.0000 mg | Freq: Four times a day (QID) | INTRAMUSCULAR | Status: DC | PRN
  Administered 2015-11-18: 4 mg via INTRAVENOUS
  Filled 2015-11-15: qty 2

## 2015-11-15 MED ORDER — SENNA 8.6 MG PO TABS
2.0000 | ORAL_TABLET | Freq: Every day | ORAL | Status: DC
  Administered 2015-11-15 – 2015-11-17 (×3): 17.2 mg via ORAL
  Filled 2015-11-15 (×3): qty 2

## 2015-11-15 MED ORDER — POVIDONE-IODINE 10 % EX SWAB
2.0000 | Freq: Once | CUTANEOUS | Status: DC
Start: 2015-11-15 — End: 2015-11-15

## 2015-11-15 MED ORDER — CEFAZOLIN IN D5W 1 GM/50ML IV SOLN
1.0000 g | Freq: Four times a day (QID) | INTRAVENOUS | Status: AC
  Administered 2015-11-15 (×2): 1 g via INTRAVENOUS
  Filled 2015-11-15 (×3): qty 50

## 2015-11-15 MED ORDER — 0.9 % SODIUM CHLORIDE (POUR BTL) OPTIME
TOPICAL | Status: DC | PRN
Start: 2015-11-15 — End: 2015-11-15
  Administered 2015-11-15 (×2): 1000 mL

## 2015-11-15 MED ORDER — ACETAMINOPHEN 10 MG/ML IV SOLN
INTRAVENOUS | Status: AC
  Administered 2015-11-15: 1000 mg via INTRAVENOUS
  Filled 2015-11-15: qty 100

## 2015-11-15 MED ORDER — APIXABAN 2.5 MG PO TABS
2.5000 mg | ORAL_TABLET | Freq: Two times a day (BID) | ORAL | Status: DC
  Administered 2015-11-16 – 2015-11-18 (×5): 2.5 mg via ORAL
  Filled 2015-11-15 (×5): qty 1

## 2015-11-15 MED ORDER — MIDAZOLAM HCL 2 MG/2ML IJ SOLN
INTRAMUSCULAR | Status: AC
  Filled 2015-11-15: qty 2

## 2015-11-15 MED ORDER — METHOCARBAMOL 1000 MG/10ML IJ SOLN
500.0000 mg | Freq: Four times a day (QID) | INTRAVENOUS | Status: DC | PRN
  Filled 2015-11-15: qty 5

## 2015-11-15 MED ORDER — DEXAMETHASONE SODIUM PHOSPHATE 10 MG/ML IJ SOLN
INTRAMUSCULAR | Status: DC | PRN
  Administered 2015-11-15: 10 mg via INTRAVENOUS

## 2015-11-15 MED ORDER — EPHEDRINE 5 MG/ML INJ
INTRAVENOUS | Status: AC
  Filled 2015-11-15: qty 10

## 2015-11-15 MED ORDER — PROPOFOL 10 MG/ML IV BOLUS
INTRAVENOUS | Status: DC | PRN
  Administered 2015-11-15: 30 mg via INTRAVENOUS

## 2015-11-15 MED ORDER — ALUM & MAG HYDROXIDE-SIMETH 200-200-20 MG/5ML PO SUSP: 30.0000 mL | ORAL | Status: DC | PRN

## 2015-11-15 MED ORDER — PHENYLEPHRINE HCL 10 MG/ML IJ SOLN
INTRAVENOUS | Status: DC | PRN
  Administered 2015-11-15: 10 ug/min via INTRAVENOUS

## 2015-11-15 MED ORDER — METOCLOPRAMIDE HCL 5 MG/ML IJ SOLN: 5.0000 mg | Freq: Three times a day (TID) | INTRAMUSCULAR | Status: DC | PRN

## 2015-11-15 MED ORDER — SODIUM CHLORIDE 0.9 % IR SOLN
Status: DC | PRN
  Administered 2015-11-15: 3000 mL

## 2015-11-15 MED ORDER — ONDANSETRON HCL 4 MG PO TABS: 4.0000 mg | ORAL_TABLET | Freq: Four times a day (QID) | ORAL | Status: DC | PRN

## 2015-11-15 MED ORDER — METHOCARBAMOL 500 MG PO TABS
500.0000 mg | ORAL_TABLET | Freq: Four times a day (QID) | ORAL | Status: DC | PRN
  Administered 2015-11-15: 500 mg via ORAL
  Filled 2015-11-15: qty 1

## 2015-11-15 MED ORDER — ACETAMINOPHEN 325 MG PO TABS
650.0000 mg | ORAL_TABLET | Freq: Four times a day (QID) | ORAL | Status: DC | PRN
  Administered 2015-11-16: 650 mg via ORAL
  Filled 2015-11-15: qty 2

## 2015-11-15 MED ORDER — SUGAMMADEX SODIUM 200 MG/2ML IV SOLN
INTRAVENOUS | Status: DC | PRN
  Administered 2015-11-15: 200 mg via INTRAVENOUS

## 2015-11-15 MED ORDER — BUPIVACAINE-EPINEPHRINE (PF) 0.5% -1:200000 IJ SOLN
INTRAMUSCULAR | Status: AC
  Filled 2015-11-15: qty 60

## 2015-11-15 MED ORDER — POLYETHYLENE GLYCOL 3350 17 G PO PACK
17.0000 g | PACK | Freq: Every day | ORAL | Status: DC | PRN
  Administered 2015-11-16: 17 g via ORAL
  Filled 2015-11-15: qty 1

## 2015-11-15 MED ORDER — DEXAMETHASONE SODIUM PHOSPHATE 10 MG/ML IJ SOLN
10.0000 mg | Freq: Once | INTRAMUSCULAR | Status: AC
  Administered 2015-11-16: 10 mg via INTRAVENOUS
  Filled 2015-11-15: qty 1

## 2015-11-15 MED ORDER — SODIUM CHLORIDE 0.9 % IJ SOLN
INTRAMUSCULAR | Status: DC | PRN
  Administered 2015-11-15: 50 mL
  Administered 2015-11-15: 30 mL

## 2015-11-15 MED ORDER — EPHEDRINE SULFATE 50 MG/ML IJ SOLN
INTRAMUSCULAR | Status: DC | PRN
  Administered 2015-11-15: 5 mg via INTRAVENOUS

## 2015-11-15 MED ORDER — LIDOCAINE 2% (20 MG/ML) 5 ML SYRINGE
INTRAMUSCULAR | Status: AC
  Filled 2015-11-15: qty 5

## 2015-11-15 MED ORDER — FENTANYL CITRATE (PF) 100 MCG/2ML IJ SOLN
INTRAMUSCULAR | Status: AC
  Filled 2015-11-15: qty 2

## 2015-11-15 MED ORDER — SODIUM CHLORIDE 0.9 % IV SOLN: 10.0000 mL/h | Freq: Once | INTRAVENOUS | Status: DC

## 2015-11-15 MED ORDER — LACTATED RINGERS IV SOLN
INTRAVENOUS | Status: DC | PRN
  Administered 2015-11-15 (×2): via INTRAVENOUS

## 2015-11-15 MED ORDER — METOCLOPRAMIDE HCL 5 MG PO TABS: 5.0000 mg | ORAL_TABLET | Freq: Three times a day (TID) | ORAL | Status: DC | PRN

## 2015-11-15 MED ORDER — PROMETHAZINE HCL 25 MG/ML IJ SOLN: 6.2500 mg | INTRAMUSCULAR | Status: DC | PRN

## 2015-11-15 MED ORDER — KETOROLAC TROMETHAMINE 30 MG/ML IJ SOLN
INTRAMUSCULAR | Status: AC
  Filled 2015-11-15: qty 2

## 2015-11-15 MED ORDER — DOCUSATE SODIUM 100 MG PO CAPS
100.0000 mg | ORAL_CAPSULE | Freq: Two times a day (BID) | ORAL | Status: DC
  Administered 2015-11-15 – 2015-11-18 (×6): 100 mg via ORAL
  Filled 2015-11-15 (×6): qty 1

## 2015-11-15 MED ORDER — SUCCINYLCHOLINE CHLORIDE 20 MG/ML IJ SOLN
INTRAMUSCULAR | Status: DC | PRN
  Administered 2015-11-15: 40 mg via INTRAVENOUS

## 2015-11-15 MED ORDER — DEXTROSE 5 % IV SOLN
INTRAVENOUS | Status: DC | PRN
  Administered 2015-11-15: 07:00:00 via INTRAVENOUS

## 2015-11-15 MED ORDER — MENTHOL 3 MG MT LOZG: 1.0000 | LOZENGE | OROMUCOSAL | Status: DC | PRN

## 2015-11-15 MED ORDER — TRANEXAMIC ACID 1000 MG/10ML IV SOLN
1000.0000 mg | Freq: Once | INTRAVENOUS | Status: AC
  Administered 2015-11-15: 1000 mg via INTRAVENOUS
  Filled 2015-11-15 (×2): qty 10

## 2015-11-15 MED ORDER — MIDAZOLAM HCL 5 MG/5ML IJ SOLN
INTRAMUSCULAR | Status: DC | PRN
  Administered 2015-11-15 (×2): 1 mg via INTRAVENOUS

## 2015-11-15 MED ORDER — PHENYLEPHRINE HCL 10 MG/ML IJ SOLN
INTRAMUSCULAR | Status: DC | PRN
  Administered 2015-11-15: 40 ug via INTRAVENOUS

## 2015-11-15 MED ORDER — ARTIFICIAL TEARS OP OINT
TOPICAL_OINTMENT | OPHTHALMIC | Status: DC | PRN
Start: 2015-11-15 — End: 2015-11-15
  Administered 2015-11-15: 1 via OPHTHALMIC

## 2015-11-15 MED ORDER — KETOROLAC TROMETHAMINE 15 MG/ML IJ SOLN
7.5000 mg | Freq: Four times a day (QID) | INTRAMUSCULAR | Status: AC
  Administered 2015-11-15 – 2015-11-16 (×3): 7.5 mg via INTRAVENOUS
  Filled 2015-11-15 (×3): qty 1

## 2015-11-15 MED ORDER — KETOROLAC TROMETHAMINE 30 MG/ML IJ SOLN
INTRAMUSCULAR | Status: DC | PRN
  Administered 2015-11-15 (×2): 30 mg via INTRAMUSCULAR

## 2015-11-15 MED ORDER — ONDANSETRON HCL 4 MG/2ML IJ SOLN
INTRAMUSCULAR | Status: DC | PRN
  Administered 2015-11-15: 4 mg via INTRAVENOUS

## 2015-11-15 MED ORDER — LOSARTAN POTASSIUM 50 MG PO TABS
100.0000 mg | ORAL_TABLET | Freq: Every day | ORAL | Status: DC
Start: 2015-11-15 — End: 2015-11-18
  Administered 2015-11-15 – 2015-11-18 (×4): 100 mg via ORAL
  Filled 2015-11-15 (×4): qty 2

## 2015-11-15 MED ORDER — LOPERAMIDE HCL 2 MG PO CAPS: 2.0000 mg | ORAL_CAPSULE | Freq: Four times a day (QID) | ORAL | Status: DC | PRN

## 2015-11-15 MED ORDER — ROCURONIUM BROMIDE 100 MG/10ML IV SOLN
INTRAVENOUS | Status: DC | PRN
  Administered 2015-11-15: 30 mg via INTRAVENOUS
  Administered 2015-11-15: 50 mg via INTRAVENOUS
  Administered 2015-11-15: 20 mg via INTRAVENOUS

## 2015-11-15 SURGICAL SUPPLY — 59 items
ALCOHOL ISOPROPYL (RUBBING) (MISCELLANEOUS) ×2 IMPLANT
BLADE SURG 10 STRL SS (BLADE) ×2 IMPLANT
BLADE SURG ROTATE 9660 (MISCELLANEOUS) IMPLANT
CAPT HIP TOTAL 2 ×4 IMPLANT
CHLORAPREP W/TINT 26ML (MISCELLANEOUS) ×2 IMPLANT
COVER SURGICAL LIGHT HANDLE (MISCELLANEOUS) ×2 IMPLANT
DECANTER SPIKE VIAL GLASS SM (MISCELLANEOUS) ×2 IMPLANT
DERMABOND ADVANCED (GAUZE/BANDAGES/DRESSINGS) ×2
DERMABOND ADVANCED .7 DNX12 (GAUZE/BANDAGES/DRESSINGS) ×2 IMPLANT
DRAPE C-ARM 42X72 X-RAY (DRAPES) ×2 IMPLANT
DRAPE IMP U-DRAPE 54X76 (DRAPES) ×8 IMPLANT
DRAPE STERI IOBAN 125X83 (DRAPES) ×2 IMPLANT
DRAPE U-SHAPE 47X51 STRL (DRAPES) ×6 IMPLANT
DRSG AQUACEL AG ADV 3.5X10 (GAUZE/BANDAGES/DRESSINGS) ×4 IMPLANT
ELECT BLADE 4.0 EZ CLEAN MEGAD (MISCELLANEOUS) ×2
ELECT REM PT RETURN 9FT ADLT (ELECTROSURGICAL) ×2
ELECTRODE BLDE 4.0 EZ CLN MEGD (MISCELLANEOUS) ×1 IMPLANT
ELECTRODE REM PT RTRN 9FT ADLT (ELECTROSURGICAL) ×1 IMPLANT
EVACUATOR 1/8 PVC DRAIN (DRAIN) IMPLANT
GLOVE BIO SURGEON STRL SZ8.5 (GLOVE) ×6 IMPLANT
GLOVE BIOGEL PI IND STRL 6 (GLOVE) ×1 IMPLANT
GLOVE BIOGEL PI IND STRL 8.5 (GLOVE) ×1 IMPLANT
GLOVE BIOGEL PI INDICATOR 6 (GLOVE) ×1
GLOVE BIOGEL PI INDICATOR 8.5 (GLOVE) ×1
GLOVE SURG SS PI 6.5 STRL IVOR (GLOVE) ×4 IMPLANT
GOWN STRL REUS W/ TWL LRG LVL3 (GOWN DISPOSABLE) ×3 IMPLANT
GOWN STRL REUS W/TWL 2XL LVL3 (GOWN DISPOSABLE) ×2 IMPLANT
GOWN STRL REUS W/TWL LRG LVL3 (GOWN DISPOSABLE) ×3
HANDPIECE INTERPULSE COAX TIP (DISPOSABLE) ×2
HEAD FEMORAL 32 CERAMIC (Hips) IMPLANT
HOOD PEEL AWAY FACE SHEILD DIS (HOOD) ×4 IMPLANT
HOOD PEEL AWAY FLYTE STAYCOOL (MISCELLANEOUS) ×6 IMPLANT
KIT BASIN OR (CUSTOM PROCEDURE TRAY) ×2 IMPLANT
KIT ROOM TURNOVER OR (KITS) ×2 IMPLANT
MANIFOLD NEPTUNE II (INSTRUMENTS) ×2 IMPLANT
MARKER SKIN DUAL TIP RULER LAB (MISCELLANEOUS) ×6 IMPLANT
NEEDLE SPNL 18GX3.5 QUINCKE PK (NEEDLE) ×4 IMPLANT
NS IRRIG 1000ML POUR BTL (IV SOLUTION) ×2 IMPLANT
PACK TOTAL JOINT (CUSTOM PROCEDURE TRAY) ×2 IMPLANT
PACK UNIVERSAL I (CUSTOM PROCEDURE TRAY) ×4 IMPLANT
PAD ARMBOARD 7.5X6 YLW CONV (MISCELLANEOUS) ×4 IMPLANT
SAW OSC TIP CART 19.5X105X1.3 (SAW) ×4 IMPLANT
SEALER BIPOLAR AQUA 6.0 (INSTRUMENTS) ×2 IMPLANT
SET HNDPC FAN SPRY TIP SCT (DISPOSABLE) ×2 IMPLANT
SOLUTION BETADINE 4OZ (MISCELLANEOUS) ×2 IMPLANT
SUCTION FRAZIER HANDLE 10FR (MISCELLANEOUS) ×1
SUCTION TUBE FRAZIER 10FR DISP (MISCELLANEOUS) ×1 IMPLANT
SUT ETHIBOND NAB CT1 #1 30IN (SUTURE) ×8 IMPLANT
SUT MNCRL AB 3-0 PS2 18 (SUTURE) ×4 IMPLANT
SUT MON AB 2-0 CT1 36 (SUTURE) ×4 IMPLANT
SUT VIC AB 1 CT1 27 (SUTURE) ×3
SUT VIC AB 1 CT1 27XBRD ANBCTR (SUTURE) ×3 IMPLANT
SUT VIC AB 2-0 CT1 27 (SUTURE) ×2
SUT VIC AB 2-0 CT1 TAPERPNT 27 (SUTURE) ×2 IMPLANT
SUT VLOC 180 0 24IN GS25 (SUTURE) ×4 IMPLANT
SYR 50ML LL SCALE MARK (SYRINGE) ×2 IMPLANT
TOWEL OR 17X24 6PK STRL BLUE (TOWEL DISPOSABLE) ×2 IMPLANT
TOWEL OR 17X26 10 PK STRL BLUE (TOWEL DISPOSABLE) ×2 IMPLANT
TRAY FOLEY CATH 16FR SILVER (SET/KITS/TRAYS/PACK) ×2 IMPLANT

## 2015-11-15 NOTE — Anesthesia Postprocedure Evaluation (Signed)
Anesthesia Post Note  Patient: Robertta Finnell  Procedure(s) Performed: Procedure(s) (LRB): BILATERAL ANTERIOR TOTAL HIP ARTHROPLASTY (Bilateral)  Patient location during evaluation: PACU Anesthesia Type: General Level of consciousness: awake and alert Pain management: pain level controlled Vital Signs Assessment: post-procedure vital signs reviewed and stable Respiratory status: spontaneous breathing, nonlabored ventilation, respiratory function stable and patient connected to nasal cannula oxygen Cardiovascular status: blood pressure returned to baseline and stable Postop Assessment: no signs of nausea or vomiting Anesthetic complications: no    Last Vitals:  Vitals:   11/15/15 1300 11/15/15 1315  BP: 122/73   Pulse: 67 71  Resp: 12 14  Temp: 36.6 C     Last Pain:  Vitals:   11/15/15 1300  TempSrc:   PainSc: Asleep                 Nilda Simmer

## 2015-11-15 NOTE — Anesthesia Procedure Notes (Signed)
Procedure Name: Intubation Date/Time: 11/15/2015 7:34 AM Performed by: Jacquiline Doe A Pre-anesthesia Checklist: Patient identified, Emergency Drugs available, Suction available and Patient being monitored Patient Re-evaluated:Patient Re-evaluated prior to inductionOxygen Delivery Method: Circle System Utilized Preoxygenation: Pre-oxygenation with 100% oxygen Intubation Type: IV induction Ventilation: Mask ventilation without difficulty Laryngoscope Size: Mac and 4 Grade View: Grade I Tube type: Oral Tube size: 7.0 mm Number of attempts: 1 Airway Equipment and Method: Stylet and Oral airway Placement Confirmation: ETT inserted through vocal cords under direct vision,  positive ETCO2 and breath sounds checked- equal and bilateral Secured at: 22 cm Tube secured with: Tape Dental Injury: Teeth and Oropharynx as per pre-operative assessment

## 2015-11-15 NOTE — Interval H&P Note (Signed)
History and Physical Interval Note:  11/15/2015 7:20 AM  Alicia Brennan  has presented today for surgery, with the diagnosis of bilateral hip OA  The various methods of treatment have been discussed with the patient and family. After consideration of risks, benefits and other options for treatment, the patient has consented to  Procedure(s): BILATERAL ANTERIOR TOTAL HIP ARTHROPLASTY (Bilateral) as a surgical intervention .  The patient's history has been reviewed, patient examined, no change in status, stable for surgery.  I have reviewed the patient's chart and labs.  Questions were answered to the patient's satisfaction.     Justene Jensen, [name]

## 2015-11-15 NOTE — Progress Notes (Signed)
Garber RN notified about the results/impressions of portable pelvis, will let Dr. Lyla Glassing know.

## 2015-11-15 NOTE — H&P (View-Only) (Signed)
TOTAL HIP ADMISSION H&P  Patient is admitted for bilaterally total hip arthroplasty.  Subjective:  Chief Complaint: bilaterally hip pain  HPI: Alicia Brennan, 65 y.o. female, has a history of pain and functional disability in the bilaterally hip(s) due to arthritis and patient has failed non-surgical conservative treatments for greater than 12 weeks to include NSAID's and/or analgesics, corticosteriod injections, flexibility and strengthening excercises, supervised PT with diminished ADL's post treatment, use of assistive devices, weight reduction as appropriate and activity modification.  Onset of symptoms was abrupt starting 2 years ago with rapidlly worsening course since that time.The patient noted no past surgery on the bilaterally hip(s).  Patient currently rates pain in the bilaterally hip at 10 out of 10 with activity. Patient has night pain, worsening of pain with activity and weight bearing, pain that interfers with activities of daily living, pain with passive range of motion, crepitus and joint swelling. Patient has evidence of subchondral cysts, subchondral sclerosis, periarticular osteophytes, joint subluxation and joint space narrowing by imaging studies. This condition presents safety issues increasing the risk of falls. There is no current active infection.  Patient Active Problem List   Diagnosis Date Noted  . Solitary pulmonary nodule 05/03/2015  . Localization-related (focal) (partial) idiopathic epilepsy and epileptic syndromes with seizures of localized onset, not intractable, without status epilepticus ([place]) 04/27/2015  . Generalized tonic-clonic seizure ([place]) 04/25/2015  . Cancer ([place])   . Sarcoidosis of lung ([place])   . Hypertension   . Recurrent headache    Past Medical History:  Diagnosis Date  . Arthritis   . Cancer [facility]) 2008   anal  . Hypertension   . IBS (irritable bowel syndrome)   . IBS (irritable bowel syndrome)   . Leg fracture, left   . Recurrent  headache   . Sarcoidosis of lung ([place])   . Seizures ([place])     Past Surgical History:  Procedure Laterality Date  . CARPAL TUNNEL RELEASE Bilateral   . CHOLECYSTECTOMY    . COLONOSCOPY    . EYE SURGERY     r/t sarcoidisis  . ORIF ELBOW FRACTURE Left   . TIBIA FRACTURE SURGERY Left      (Not in a hospital admission) No Known Allergies  Social History  Substance Use Topics  . Smoking status: Never Smoker  . Smokeless tobacco: Never Used  . Alcohol use No    Family History  Problem Relation Age of Onset  . Dementia Mother   . High blood pressure Father   . Dementia Maternal Aunt   . Sarcoidosis Son   . Seizures Neg Hx      Review of Systems  Constitutional: Negative.   HENT: Negative.   Eyes: Negative.   Respiratory: Negative.   Cardiovascular: Negative.   Gastrointestinal: Positive for blood in stool and diarrhea.  Genitourinary: Negative.   Musculoskeletal: Positive for joint pain.  Skin: Negative.   Neurological: Positive for seizures.  Endo/Heme/Allergies: Negative.   Psychiatric/Behavioral: Negative.     Objective:  Physical Exam  Vitals reviewed. Constitutional: She is oriented to person, place, and time. She appears well-developed and well-nourished.  HENT:  Head: Normocephalic and atraumatic.  Eyes: Conjunctivae and EOM are normal. Pupils are equal, round, and reactive to light.  Neck: Normal range of motion. Neck supple.  Cardiovascular: Normal rate, regular rhythm and intact distal pulses.   Respiratory: Breath sounds normal. No respiratory distress.  GI: Soft. Bowel sounds are normal.  Genitourinary:  Genitourinary Comments: deferred  Musculoskeletal:  Right hip: She exhibits decreased range of motion, decreased strength and bony tenderness.       Left hip: She exhibits decreased range of motion, decreased strength and bony tenderness.  Neurological: She is alert and oriented to person, place, and time. She has normal reflexes.  Skin: Skin  is warm and dry.  Psychiatric: She has a normal mood and affect. Her behavior is normal. Judgment and thought content normal.    Vital signs in last 24 hours: @VSRANGES @  Labs:   Estimated body mass index is 27.59 kg/m as calculated from the following:   Height as of 11/05/15: 4\' 11"  (1.499 m).   Weight as of 11/05/15: 62 kg (136 lb 9.6 oz).   Imaging Review Plain radiographs demonstrate severe degenerative joint disease of the right hip(s). The bone quality appears to be poor for age and reported activity level.  Assessment/Plan:  End stage arthritis, bilaterally hip(s)  The patient history, physical examination, clinical judgement of the provider and imaging studies are consistent with end stage degenerative joint disease of the bilaterally hip(s) and total hip arthroplasty is deemed medically necessary. The treatment options including medical management, injection therapy, arthroscopy and arthroplasty were discussed at length. The risks and benefits of total hip arthroplasty were presented and reviewed. The risks due to aseptic loosening, infection, stiffness, dislocation/subluxation,  thromboembolic complications and other imponderables were discussed.  The patient acknowledged the explanation, agreed to proceed with the plan and consent was signed. Patient is being admitted for inpatient treatment for surgery, pain control, PT, OT, prophylactic antibiotics, VTE prophylaxis, progressive ambulation and ADL's and discharge planning.The patient is planning to be discharged home with home health services

## 2015-11-15 NOTE — Transfer of Care (Signed)
Immediate Anesthesia Transfer of Care Note  Patient: Alicia Brennan  Procedure(s) Performed: Procedure(s): BILATERAL ANTERIOR TOTAL HIP ARTHROPLASTY (Bilateral)  Patient Location: PACU  Anesthesia Type:General  Level of Consciousness: sedated, patient cooperative and responds to stimulation  Airway & Oxygen Therapy: Patient Spontanous Breathing and Patient connected to face mask oxygen  Post-op Assessment: Report given to RN and Post -op Vital signs reviewed and stable  Post vital signs: Reviewed and stable  Last Vitals:  Vitals:   11/15/15 0647  BP: (!) 155/73  Pulse: 78  Resp: 20  Temp: 36.7 C    Last Pain:  Vitals:   11/15/15 0708  TempSrc:   PainSc: 10-Worst pain ever      Patients Stated Pain Goal: 2 ([id number])  Complications: No apparent anesthesia complications

## 2015-11-15 NOTE — Op Note (Signed)
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  PROCEDURE PERFORMED:  Bilateral total hip arthroplasty.  IMPLANTS: Left side:   1. DePuy Pinnacle Gription multihole cup size 50 mm with     6.5 mm cancellous bone screws x2. 2. Altrx polyethylene liner 32 x 50 mm, +4 neutral. 3. Tri-Lock femoral stem size 4 standard. 4. Biolox delta ceramic femoral head 32+ 5 mm.  Right:   1.DePuy Pinnacle sector Gription size 50 mm acetabular shell     with 6.5 mm cancellous screw x1. 2. Altrx polyethylene liner 32 x 50 mm, +4 neutral. 3. Tri-Lock femoral stem, size 4 standard. 4. Biolox delta ceramic head, size 32+ 5 mm.  ANESTHESIA:  General.  EBL:  1650 total (1200 mL left side, 450 mL right side).  COMPLICATIONS:  None.  SPECIMENS:  None.  TUBES AND DRAINS:  None.  DISPOSITION:  Stable to PACU.  BLOOD PRODUCTS:  2 units of packed red blood cells.  INDICATIONS:  The patient is a 65 year old female with a history of sarcoidosis for which she used to take prednisone.  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  She had disabling bilateral hip pain.  There was refractory to conservative management. She was indicated for bilateral total hip arthroplasty.  The risks, benefits, and alternatives to the procedure were explained and she elected to proceed.  DESCRIPTION OF PROCEDURE IN DETAIL:  The surgical sites were marked by myself.  She was taken to the operating room, and general anesthesia was induced on the stretcher.   Foley was placed.  She was then positioned on the Hana table.  All bony prominences were well padded.  We began with the left side.  The left hip was prepped and draped in normal sterile surgical fashion.  Time-out was called verifying side and site of surgery.  She did receive 2 g of IV Ancef within 60 minutes of beginning the procedure.  Direct anterior approach to the left hip was performed through the Hueter interval.  Lateral femoral circumflex vessels were treated with the Aquamantys.  The anterior capsule was exposed and an inverted T capsulotomy was made.  Femoral neck cut was made to the level of the templated cut.  A corkscrew was placed into the head and the head was removed.  The head was over 50% collapsed.  The hip had copious inflamed scar tissue.  The head was passed to the back table and was measured.  Acetabular exposure was achieved, and the pulvinar and labrum were excised.  She did have a mild protrusio deformity.  I sequentially reamed up to a size 49 mm reamer keeping the hip center as low and lateral as possible.  There was a large cyst in the superior acetabulum  which I curetted to a bleeding base.  Bone graft was harvested from the native head using a Customer service manager, RNFA.  I placed the bone graft into the cyst.  I impacted the bone graft with a 48 mm reamer on reverse.  A 50 mm cup was opened and impacted into place at approximately 40 degrees of abduction and 20 degrees of anteversion.  The cup achieved excellent press fit, and I chose to augment the fixation with two 6.5 mm cancellous screws.  The final polyethylene liner was impacted into place and acetabular osteophytes were removed with an osteotome.  I then gained femoral exposure taking care to protect the abductors and greater trochanter.  Exposure was performed using standard external rotation, extension, and adduction.  The capsule was peeled off the inner aspect of the greater trochanter taking  care to preserve the short rotators.  A Cookie cutter was used into the femoral canal and then a femoral canal finder was placed.  Sequential broaching was performed up to a 4.  The calcar planer was used on the neck remnant.  I placed a standard offset neck and a trial head ball.  The hip was reduced.  Leg length and offset were checked fluoroscopically.  The hip was dislocated.  Trial components were removed.  Final implants were placed and the hip was reduced.  Fluoroscopy was used to confirm component position and leg lengths.  At 90 degrees of external rotation in full extension, the hip was stable to an anterior directed force and there was no impingement of the neck on the acetabular component.  The wound was copiously irrigated with a dilute Betadine solution followed by normal saline.  Marcaine solution was injected into the periarticular soft tissue.  The wound was closed in layers using #1 Vicryl and 0 V-Loc for the fascia, 2-0 Vicryl for the subcutaneous fat, 2-0 Monocryl for the deep dermal layer, 3-0 running Monocryl subcuticular stitch, Dermabond for the skin.  Once the glue was fully dried, an Aquacel AG dressing was applied.  The right hip was then prepped and draped in the normal sterile surgical fashion.  A direct anterior approach to the hip was performed through the Hueter interval.  Lateral femoral circumflex vessels were treated with the Aquamantys.  Anterior capsule was exposed and an inverted T capsulotomy was made.  Femoral neck cut was made at the level of template cut.  A corkscrew was placed in the head and the head was removed.  The head was about 30% collapsed, not as severe as the contralateral side.  The head was passed to the back table and measured.  Acetabular exposure was achieved, the pulvinar and labrum were excised. I reamed sequentially up to a 49 mm reamer.  A 50 mm cup was opened and impacted into place approximately 40 degrees adduction and  20 degrees of anteversion.  The cup achieved excellent press fit, and I chose to augment the fixation with a single 6.5 mm cancellous screw.  Final polyethylene liner was impacted into place and acetabular osteophytes were removed with an osteotome.  I gained femoral exposure taking care to protect the abductors and greater trochanter.  This was performed using standard external rotation, extension, and abduction.  The capsule was peeled off the inner aspect of the greater trochanter taking care to preserve the short rotators.  A Cookie cutter was used into the femoral canal and the canal finder was placed.  Sequential broaching was performed up to a size 4. The  calcar planer was used on the neck remnant.  I placed a standard offset neck and a trial head ball.  Hip was reduced.  Leg length and offset were checked fluoroscopically.  The hip was dislocated.  Trial components were removed.  Final implants were placed and the hip was reduced.  Fluoroscopy was used to confirm component position and leg lengths.  At 90 degrees of external rotation in full extension, the hip was stable to an anterior directed force.  The wound was copiously irrigated with a dilute Betadine solution followed by normal saline.  Marcaine solution was injected in periarticular soft tissue.  Wound was closed in layers using #1 Vicryl and 0 V-Loc for fascia, 2-0 Vicryl for the subcutaneous fat, 2-0 Monocryl for the deep dermal layer, 3-0 running Monocryl subcuticular stitch, Dermabond for the skin.  Once the glue was fully dried, Aquacel AG dressing was applied.  The patient was then extubated, taken to the recovery room in stable condition.  Sponge, needle, and instrument counts were correct at the end of the case x2.  There were no known complications.          ______________________________ Rod Can, MD     BS/MEDQ  D:  11/15/2015  T:  11/15/2015  Job:  [id number]

## 2015-11-15 NOTE — Brief Op Note (Signed)
11/15/2015  11:21 AM  PATIENT:  Alicia Brennan  65 y.o. female  PRE-OPERATIVE DIAGNOSIS:  bilateral hip osteoarthritis  POST-OPERATIVE DIAGNOSIS:  bilateral hip osteoarthritis  PROCEDURE:  Procedure(s): BILATERAL ANTERIOR TOTAL HIP ARTHROPLASTY (Bilateral)  SURGEON:  Surgeon(s) and Role:    * Rod Can, MD - Primary  PHYSICIAN ASSISTANT: n/a  ASSISTANTS: April green, rnfa   ANESTHESIA:   local and general  EBL:  Total I/O In: 3620 [I.V.:2200; Blood:670; IV Piggyback:750] Out: 2450 [Urine:500; Blood:1950]  BLOOD ADMINISTERED: 2 units PRBCs  DRAINS: none   LOCAL MEDICATIONS USED:  MARCAINE     SPECIMEN:  No Specimen  DISPOSITION OF SPECIMEN:  N/A  COUNTS:  YES  TOURNIQUET:  * No tourniquets in log *  DICTATION: .Other Dictation: Dictation Number not provided by telephone system  PLAN OF CARE: Admit to inpatient   PATIENT DISPOSITION:  PACU - hemodynamically stable.   Delay start of Pharmacological VTE agent (>24hrs) due to surgical blood loss or risk of bleeding: no

## 2015-11-16 LAB — BASIC METABOLIC PANEL
Anion gap: 7 (ref 5–15)
BUN: 10 mg/dL (ref 6–20)
CHLORIDE: 111 mmol/L (ref 101–111)
CO2: 20 mmol/L — ABNORMAL LOW (ref 22–32)
CREATININE: 0.72 mg/dL (ref 0.44–1.00)
Calcium: 8.4 mg/dL — ABNORMAL LOW (ref 8.9–10.3)
GFR calc Af Amer: >60 mL/min (ref >60)
GLUCOSE: 97 mg/dL (ref 65–99)
POTASSIUM: 3.8 mmol/L (ref 3.5–5.1)
SODIUM: 138 mmol/L (ref 135–145)

## 2015-11-16 LAB — CBC
HCT: 26 % — ABNORMAL LOW (ref 36.0–46.0)
Hemoglobin: 8.6 g/dL — ABNORMAL LOW (ref 12.0–15.0)
MCH: 27.6 pg (ref 26.0–34.0)
MCHC: 33.1 g/dL (ref 30.0–36.0)
MCV: 83.3 fL (ref 78.0–100.0)
PLATELETS: 128 10*3/uL — AB (ref 150–400)
RBC: 3.12 MIL/uL — AB (ref 3.87–5.11)
RDW: 16.3 % — ABNORMAL HIGH (ref 11.5–15.5)
WBC: 9 10*3/uL (ref 4.0–10.5)

## 2015-11-16 MED ORDER — PNEUMOCOCCAL VAC POLYVALENT 25 MCG/0.5ML IJ INJ
0.5000 mL | INJECTION | INTRAMUSCULAR | Status: DC
  Filled 2015-11-16: qty 0.5

## 2015-11-16 MED ORDER — ONDANSETRON HCL 4 MG PO TABS: 4.0000 mg | ORAL_TABLET | Freq: Four times a day (QID) | ORAL | 0 refills | Status: DC | PRN

## 2015-11-16 MED ORDER — HYDROCODONE-ACETAMINOPHEN 5-325 MG PO TABS: 1.0000 | ORAL_TABLET | ORAL | 0 refills | Status: DC | PRN

## 2015-11-16 MED ORDER — DOCUSATE SODIUM 100 MG PO CAPS: 100.0000 mg | ORAL_CAPSULE | Freq: Two times a day (BID) | ORAL | 3 refills | Status: DC

## 2015-11-16 MED ORDER — SENNA 8.6 MG PO TABS: 2.0000 | ORAL_TABLET | Freq: Every day | ORAL | 3 refills | Status: DC

## 2015-11-16 MED ORDER — APIXABAN 2.5 MG PO TABS: 2.5000 mg | ORAL_TABLET | Freq: Two times a day (BID) | ORAL | 0 refills | Status: DC

## 2015-11-16 NOTE — Evaluation (Signed)
Physical Therapy Evaluation Patient Details Name: Alicia Brennan MRN: [id number] DOB: 1950/06/21 Today's Date: 11/16/2015   History of Present Illness  65 y.o. female now s/p bilateral direct anterior THA. PMH: hypertension, sarcoidosis of lung, seizures.  Clinical Impression  Pt is s/p bilateral direct anterior THA resulting in the deficits listed below (see PT Problem List). Pt moving slowly during initial PT session, able to ambulate 10 feet with rw. Pt will benefit from skilled PT to increase their independence and safety with mobility to allow discharge to home with family assistance.      Follow Up Recommendations Supervision for mobility/OOB;Home health PT    Equipment Recommendations  None recommended by PT    Recommendations for Other Services       Precautions / Restrictions Precautions Precautions: Fall Precaution Comments: no hip precautions, seizures Restrictions Weight Bearing Restrictions: Yes RLE Weight Bearing: Weight bearing as tolerated LLE Weight Bearing: Weight bearing as tolerated      Mobility  Bed Mobility Overal bed mobility: Needs Assistance Bed Mobility: Supine to Sit     Supine to sit: Mod assist;HOB elevated     General bed mobility comments: assist provided with LEs and trunk, cues for sequence.  Transfers Overall transfer level: Needs assistance Equipment used: Rolling walker (2 wheeled) Transfers: Sit to/from Stand Sit to Stand: Mod assist         General transfer comment: cues for hand/LE placement  Ambulation/Gait Ambulation/Gait assistance: Min guard Ambulation Distance (Feet): 10 Feet Assistive device: Rolling walker (2 wheeled) Gait Pattern/deviations: Step-through pattern;Decreased step length - right;Decreased step length - left;Trunk flexed Gait velocity: very slow pattern   General Gait Details: Pt moving slowly, cues provided for sequence.   Stairs            Wheelchair Mobility    Modified Rankin (Stroke  Patients Only)       Balance Overall balance assessment: Needs assistance Sitting-balance support: Single extremity supported Sitting balance-Leahy Scale: Poor     Standing balance support: Bilateral upper extremity supported Standing balance-Leahy Scale: Poor Standing balance comment: using rw for support                             Pertinent Vitals/Pain Pain Assessment: 0-10 Pain Score: 6  Pain Location: both hips L>R Pain Descriptors / Indicators: Sore Pain Intervention(s): Limited activity within patient's tolerance;Monitored during session;Ice applied    Home Living Family/patient expects to be discharged to:: Private residence Living Arrangements: Children Available Help at Discharge: Family;Available 24 hours/day (67 y.o. granddaughter) Type of Home: House Home Access: Stairs to enter Entrance Stairs-Rails: Psychiatric nurse of Steps: 2 Home Layout: Able to live on main level with bedroom/bathroom Home Equipment: Walker - 2 wheels;Walker - standard;Cane - single point Additional Comments: Lives in [place] but staying in [place] with family.     Prior Function Level of Independence: Independent with assistive device(s)         Comments: using rw     Hand Dominance        Extremity/Trunk Assessment   Upper Extremity Assessment: Defer to OT evaluation           Lower Extremity Assessment:  (bilateral LE weakness related to pain)         Communication   Communication: No difficulties  Cognition Arousal/Alertness: Awake/alert Behavior During Therapy: WFL for tasks assessed/performed (very talkative) Overall Cognitive Status: Within Functional Limits for tasks assessed  General Comments      Exercises        Assessment/Plan    PT Assessment Patient needs continued PT services  PT Diagnosis Difficulty walking   PT Problem List Decreased strength;Decreased range of motion;Decreased  activity tolerance;Decreased balance;Decreased mobility  PT Treatment Interventions DME instruction;Gait training;Stair training;Functional mobility training;Therapeutic activities;Therapeutic exercise;Patient/family education   PT Goals (Current goals can be found in the Care Plan section) Acute Rehab PT Goals Patient Stated Goal: be able to walk better  PT Goal Formulation: With patient Time For Goal Achievement: 11/30/15 Potential to Achieve Goals: Good    Frequency 7X/week   Barriers to discharge        Co-evaluation               End of Session Equipment Utilized During Treatment: Gait belt Activity Tolerance: Patient tolerated treatment well Patient left: in chair;with call bell/phone within reach;with family/visitor present Nurse Communication: Mobility status;Weight bearing status         Time: 1207-1247 PT Time Calculation (min) (ACUTE ONLY): 40 min   Charges:   PT Evaluation $PT Eval Moderate Complexity: 1 Procedure PT Treatments $Therapeutic Activity: 23-37 mins   PT G Codes:        Cassell Clement, PT, CSCS Pager [phone] Office [phone]  11/16/2015, 1:18 PM

## 2015-11-16 NOTE — Discharge Summary (Signed)
Physician Discharge Summary  Patient ID: Alicia Brennan MRN: [id number] DOB/AGE: 10/18/1950 65 y.o.  Admit date: 11/15/2015 Discharge date: 11/18/2015  Admission Diagnoses:  Osteoarthritis of both hips  Discharge Diagnoses:  Principal Problem:   Osteoarthritis of both hips Active Problems:   Avascular necrosis of bones of both hips [facility])   Past Medical History:  Diagnosis Date  . Arthritis   . Cancer [facility]) 2008   anal  . Hypertension   . IBS (irritable bowel syndrome)   . IBS (irritable bowel syndrome)   . Leg fracture, left   . Recurrent headache   . Sarcoidosis of lung ([place])   . Seizures ([place])     Surgeries: Procedure(s): BILATERAL ANTERIOR TOTAL HIP ARTHROPLASTY on 11/15/2015   Consultants (if any):   Discharged Condition: Improved  Hospital Course: Alicia Brennan is an 65 y.o. female who was admitted 11/15/2015 with a diagnosis of Osteoarthritis of both hips and went to the operating room on 11/15/2015 and underwent the above named procedures.    She was given perioperative antibiotics:  Anti-infectives    Start     Dose/Rate Route Frequency Ordered Stop   11/15/15 1600  ceFAZolin (ANCEF) IVPB 1 g/50 mL premix     1 g 100 mL/hr over 30 Minutes Intravenous Every 6 hours 11/15/15 1424 11/15/15 2135   11/15/15 0700  ceFAZolin (ANCEF) IVPB 2g/100 mL premix     2 g 200 mL/hr over 30 Minutes Intravenous To [facility] 11/14/15 0803 11/15/15 0755    .  She was given sequential compression devices, early ambulation, and apixaban for DVT prophylaxis.  She benefited maximally from the hospital stay and there were no complications.    Recent vital signs:  Vitals:   11/18/15 0031 11/18/15 0500  BP: 130/61 123/62  Pulse: 77 77  Resp: 18 18  Temp: 98.1 F (36.7 C) 98.4 F (36.9 C)    Recent laboratory studies:  Lab Results  Component Value Date   HGB 7.6 (L) 11/18/2015   HGB 8.0 (L) 11/17/2015   HGB 8.6 (L) 11/16/2015   Lab Results  Component Value  Date   WBC 8.3 11/18/2015   PLT 129 (L) 11/18/2015   No results found for: INR Lab Results  Component Value Date   NA 138 11/16/2015   K 3.8 11/16/2015   CL 111 11/16/2015   CO2 20 (L) 11/16/2015   BUN 10 11/16/2015   CREATININE 0.72 11/16/2015   GLUCOSE 97 11/16/2015    Discharge Medications:     Medication List    STOP taking these medications   aspirin EC 81 MG tablet   ibuprofen 200 MG tablet Commonly known as:  ADVIL,MOTRIN   meloxicam 15 MG tablet Commonly known as:  MOBIC     TAKE these medications   amLODipine 10 MG tablet Commonly known as:  NORVASC Take 10 mg by mouth daily.   apixaban 2.5 MG Tabs tablet Commonly known as:  ELIQUIS Take 1 tablet (2.5 mg total) by mouth every 12 (twelve) hours.   docusate sodium 100 MG capsule Commonly known as:  COLACE Take 1 capsule (100 mg total) by mouth 2 (two) times daily.   HYDROcodone-acetaminophen 5-325 MG tablet Commonly known as:  NORCO/VICODIN Take 1-2 tablets by mouth every 4 (four) hours as needed (breakthrough pain).   levETIRAcetam 500 MG 24 hr tablet Commonly known as:  KEPPRA XR Take 3 tablets at night What changed:  how much to take  how to take this  when to  take this  additional instructions   loperamide 2 MG tablet Commonly known as:  IMODIUM A-D Take 2 mg by mouth 4 (four) times daily as needed for diarrhea or loose stools. Reported on 08/11/2015   losartan 100 MG tablet Commonly known as:  COZAAR Take 1 tablet (100 mg total) by mouth daily.   ondansetron 4 MG tablet Commonly known as:  ZOFRAN Take 1 tablet (4 mg total) by mouth every 6 (six) hours as needed for nausea.   senna 8.6 MG Tabs tablet Commonly known as:  SENOKOT Take 2 tablets (17.2 mg total) by mouth at bedtime.   Vitamin D3 50000 units Tabs Take 50,000 Units by mouth every Monday.       Diagnostic Studies: Dg Pelvis Portable  Result Date: 11/15/2015 CLINICAL DATA:  Bilateral osteoarthritis of the hips.  Total hip replacements. EXAM: PORTABLE PELVIS 1-2 VIEWS COMPARISON:  Radiographs dated 08/31/2015 FINDINGS: The patient has undergone placement of total hip prostheses bilaterally. One of the screws on the left extends through the ilium medially. There is also a small full-thickness defect in the central portion of the left acetabulum. No fractures. The right hip prosthesis appears normal. IMPRESSION: Good position of bilateral hip prostheses. Left acetabular defect as described. One screw on the left extends through the ilium into the left side of the pelvis. Electronically Signed   By: Lorriane Shire M.D.   On: 11/15/2015 13:00   Dg C-arm Gt 120 Min  Result Date: 11/15/2015 CLINICAL DATA:  Bilateral total hip replacements EXAM: DG C-ARM GT 120 MIN; DG HIP (WITH OR WITHOUT PELVIS) 3-4V BILAT FLUOROSCOPY TIME:  If the device does not provide the exposure index: Fluoroscopy Time:  0 minutes 28 seconds Number of Acquired Images:  4 COMPARISON:  None. FINDINGS: Frontal pelvis as well as frontal views of each hip show total hip replacements bilaterally with prosthetic components appearing well-seated bilaterally on frontal views. No fracture or dislocation evident. No bony destruction. IMPRESSION: One frontal views, total hip replacement prostheses appear well seated. No acute fracture or dislocation. Electronically Signed   By: Lowella Grip III M.D.   On: 11/15/2015 11:54   Dg Hips Bilat With Pelvis 3-4 Views  Result Date: 11/15/2015 CLINICAL DATA:  Bilateral total hip replacements EXAM: DG C-ARM GT 120 MIN; DG HIP (WITH OR WITHOUT PELVIS) 3-4V BILAT FLUOROSCOPY TIME:  If the device does not provide the exposure index: Fluoroscopy Time:  0 minutes 28 seconds Number of Acquired Images:  4 COMPARISON:  None. FINDINGS: Frontal pelvis as well as frontal views of each hip show total hip replacements bilaterally with prosthetic components appearing well-seated bilaterally on frontal views. No fracture or  dislocation evident. No bony destruction. IMPRESSION: One frontal views, total hip replacement prostheses appear well seated. No acute fracture or dislocation. Electronically Signed   By: Lowella Grip III M.D.   On: 11/15/2015 11:54    Disposition: 01-Home or Self Care  Discharge Instructions    Call MD / Call 911    Complete by:  As directed   If you experience chest pain or shortness of breath, CALL 911 and be transported to the hospital emergency room.  If you develope a fever above 101 F, pus (white drainage) or increased drainage or redness at the wound, or calf pain, call your surgeon's office.   Constipation Prevention    Complete by:  As directed   Drink plenty of fluids.  Prune juice may be helpful.  You may use a stool  softener, such as Colace (over the counter) 100 mg twice a day.  Use MiraLax (over the counter) for constipation as needed.   Diet - low sodium heart healthy    Complete by:  As directed   Driving restrictions    Complete by:  As directed   No driving for 6 weeks   Increase activity slowly as tolerated    Complete by:  As directed   Lifting restrictions    Complete by:  As directed   No lifting for 6 weeks   TED hose    Complete by:  As directed   Use stockings (TED hose) for 2 weeks on both leg(s).  You may remove them at night for sleeping.      Follow-up Information    Revanth Neidig, [name], MD. Schedule an appointment as soon as possible for a visit in 2 week(s).   Specialty:  Orthopedic Surgery Why:  For wound re-check Contact information: [address]. [address] [place] [address] [phone]        [facility] .   Why:  Someone from [facility] will contact you to arrange start date and time for therapy.  Contact information: [address] [place] [address] [phone]            Signed: Elie Goody 11/18/2015, 8:03 AM

## 2015-11-16 NOTE — Progress Notes (Signed)
Physical Therapy Treatment Patient Details Name: Alicia Brennan MRN: [id number] DOB: 26-Feb-1951 Today's Date: 11/16/2015    History of Present Illness 65 y.o. female now s/p bilateral direct anterior THA. PMH: hypertension, sarcoidosis of lung, seizures.    PT Comments    Pt making gradual progress with mobility. Pt able to ambulate 60 feet but utilizing a very guarded and slow pattern (difficulty with hip and knee flexion). Encouragement needed with transfers and bed mobility for pt to utilize new hip range of motion, pt continuing to utilize compensatory strategies. PT to continue to follow and progress mobility as tolerated.   Follow Up Recommendations  Supervision for mobility/OOB;Home health PT     Equipment Recommendations  None recommended by PT    Recommendations for Other Services       Precautions / Restrictions Precautions Precautions: Fall;Other (comment) Precaution Comments: seizures Restrictions Weight Bearing Restrictions: Yes RLE Weight Bearing: Weight bearing as tolerated LLE Weight Bearing: Weight bearing as tolerated    Mobility  Bed Mobility Overal bed mobility: Needs Assistance Bed Mobility: Sit to Supine     Supine to sit: Mod assist Sit to supine: Max assist   General bed mobility comments: assist needed with LEs to get to EOB and trunk to come to sitting. Encouraging use of rail   Transfers Overall transfer level: Needs assistance Equipment used: Rolling walker (2 wheeled) Transfers: Sit to/from Stand Sit to Stand: Mod assist         General transfer comment: cues for hand/LE placement  Ambulation/Gait Ambulation/Gait assistance: Min guard Ambulation Distance (Feet): 60 Feet Assistive device: Rolling walker (2 wheeled) Gait Pattern/deviations: Decreased step length - right;Decreased step length - left Gait velocity: very slow pattern   General Gait Details: Pt with poor hip and knee flexion during gait cycle, modest improvement with  cues.    Stairs            Wheelchair Mobility    Modified Rankin (Stroke Patients Only)       Balance Overall balance assessment: Needs assistance Sitting-balance support: Single extremity supported Sitting balance-Leahy Scale: Poor     Standing balance support: Bilateral upper extremity supported Standing balance-Leahy Scale: Poor                      Cognition Arousal/Alertness: Awake/alert Behavior During Therapy: WFL for tasks assessed/performed Overall Cognitive Status: Within Functional Limits for tasks assessed                      Exercises Total Joint Exercises Ankle Circles/Pumps: AROM;Both;15 reps Quad Sets: Strengthening;Both;10 reps Heel Slides: AAROM;Both;10 reps    General Comments        Pertinent Vitals/Pain Pain Assessment: 0-10 Pain Score: 6  Pain Location: bilateral hips R>L Pain Descriptors / Indicators: Aching Pain Intervention(s): Limited activity within patient's tolerance;Monitored during session    Home Living    Prior Function          PT Goals (current goals can now be found in the care plan section) Acute Rehab PT Goals Patient Stated Goal: get comfortable PT Goal Formulation: With patient Time For Goal Achievement: 11/30/15 Potential to Achieve Goals: Good Progress towards PT goals: Progressing toward goals    Frequency  7X/week    PT Plan Current plan remains appropriate    Co-evaluation             End of Session Equipment Utilized During Treatment: Gait belt Activity Tolerance: Patient tolerated treatment well  Patient left: in chair;with call bell/phone within reach;with family/visitor present     Time: [id number] PT Time Calculation (min) (ACUTE ONLY): 31 min  Charges:  $Gait Training: 8-22 mins $Therapeutic Exercise: 8-22 mins                    G Codes:      Cassell Clement, PT, CSCS Pager [phone] Office [phone]  11/16/2015, 4:02 PM

## 2015-11-16 NOTE — Evaluation (Signed)
Occupational Therapy Evaluation Patient Details Name: Alicia Brennan MRN: [id number] DOB: Sep 12, 1950 Today's Date: 11/16/2015    History of Present Illness 65 y.o. female now s/p bilateral direct anterior THA. PMH: hypertension, sarcoidosis of lung, seizures.   Clinical Impression   Pt with decline in function and safety with ADLs and ADL mobility with decreased balance and endurance. Pt would benefit from acute OT services to address impairments to increase level of function and safety   Follow Up Recommendations  Home health OT;Supervision - Intermittent    Equipment Recommendations  Other (comment);3 in 1 bedside comode (ADL A/E, 3 in 1)    Recommendations for Other Services       Precautions / Restrictions Precautions Precautions: Fall;Other (comment) Precaution Comments: seizures Restrictions Weight Bearing Restrictions: Yes RLE Weight Bearing: Weight bearing as tolerated LLE Weight Bearing: Weight bearing as tolerated      Mobility Bed Mobility Overal bed mobility: Needs Assistance Bed Mobility: Sit to Supine     Supine to sit: Mod assist;HOB elevated Sit to supine: Max assist   General bed mobility comments: up in recliner upon arrival. Assisted back to bed at end of session, asssist with B LEs onto bed  Transfers Overall transfer level: Needs assistance Equipment used: Rolling walker (2 wheeled) Transfers: Sit to/from Stand Sit to Stand: Mod assist         General transfer comment: cues for hand/LE placement    Balance Overall balance assessment: Needs assistance Sitting-balance support: Single extremity supported Sitting balance-Leahy Scale: Fair     Standing balance support: Bilateral upper extremity supported Standing balance-Leahy Scale: Poor Standing balance comment: using rw for support                            ADL Overall ADL's : Needs assistance/impaired     Grooming: Wash/dry hands;Wash/dry face;Standing;Min guard    Upper Body Bathing: Min guard;Sitting   Lower Body Bathing: Moderate assistance   Upper Body Dressing : Min guard;Sitting   Lower Body Dressing: Maximal assistance   Toilet Transfer: Moderate assistance;RW;Ambulation;Comfort height toilet   Toileting- Clothing Manipulation and Hygiene: Moderate assistance;Sit to/from stand   Tub/ Shower Transfer: 3 in 1;Rolling walker;Ambulation;Cueing for safety   Functional mobility during ADLs: Moderate assistance;Cueing for safety       Vision  no change from baseline          Pertinent Vitals/Pain Pain Assessment: 0-10 Pain Score: 4  Pain Location: B hips Pain Descriptors / Indicators: Sore Pain Intervention(s): Limited activity within patient's tolerance;Monitored during session;Ice applied;Repositioned     Hand Dominance Right   Extremity/Trunk Assessment Upper Extremity Assessment Upper Extremity Assessment: Overall WFL for tasks assessed   Lower Extremity Assessment Lower Extremity Assessment: Defer to PT evaluation       Communication Communication Communication: No difficulties   Cognition Arousal/Alertness: Awake/alert Behavior During Therapy: WFL for tasks assessed/performed Overall Cognitive Status: Within Functional Limits for tasks assessed                     General Comments   pt very pleasant and cooperative, talkative                 Home Living Family/patient expects to be discharged to:: Private residence Living Arrangements: Children Available Help at Discharge: Family;Available 24 hours/day Type of Home: House Home Access: Stairs to enter [employer] of Steps: 2 Entrance Stairs-Rails: Right;Left Home Layout: Able to live on main level  with bedroom/bathroom     Bathroom Shower/Tub: Occupational psychologist: Standard     Home Equipment: Environmental consultant - 2 wheels;Walker - standard;Cane - single point;Shower seat;Hand held shower head   Additional Comments: Lives in  [place] but staying in [place] with family.       Prior Functioning/Environment Level of Independence: Independent with assistive device(s)        Comments: using rw    OT Diagnosis: Acute pain   OT Problem List: Impaired balance (sitting and/or standing);Pain;Decreased activity tolerance;Decreased knowledge of use of DME or AE   OT Treatment/Interventions: Self-care/ADL training;DME and/or AE instruction;Therapeutic activities;Patient/family education    OT Goals(Current goals can be found in the care plan section) Acute Rehab OT Goals Patient Stated Goal: be able to walk better  OT Goal Formulation: With patient Time For Goal Achievement: 11/23/15 Potential to Achieve Goals: Good ADL Goals Pt Will Perform Grooming: with supervision;with set-up;standing Pt Will Perform Upper Body Bathing: with supervision;with set-up;sitting Pt Will Perform Lower Body Bathing: with min assist;with caregiver independent in assisting;with adaptive equipment Pt Will Perform Upper Body Dressing: with set-up;with supervision;sitting Pt Will Perform Lower Body Dressing: with mod assist;with caregiver independent in assisting;with adaptive equipment Pt Will Transfer to Toilet: with min assist;with min guard assist;ambulating;grab bars (3 in 1 over toilet) Pt Will Perform Toileting - Clothing Manipulation and hygiene: with min assist;sit to/from stand;with caregiver independent in assisting Pt Will Perform Tub/Shower Transfer: with min assist;with min guard assist;3 in 1;shower seat;ambulating;with caregiver independent in assisting  OT Frequency: Min 2X/week   Barriers to D/C:    none, will have family assist                     End of Session Equipment Utilized During Treatment: Gait belt;Rolling walker;Other (comment) (3 in 1) Nurse Communication: Mobility status  Activity Tolerance: Patient tolerated treatment well Patient left: in bed;with call bell/phone within reach   Time:  [id number] OT Time Calculation (min): 32 min Charges:  OT General Charges $OT Visit: 1 Procedure OT Evaluation $OT Eval Moderate Complexity: 1 Procedure OT Treatments $Self Care/Home Management : 8-22 mins $Therapeutic Activity: 8-22 mins G-Codes:    Britt Bottom 11/16/2015, 2:18 PM

## 2015-11-16 NOTE — Progress Notes (Signed)
   Subjective:  Patient reports pain as mild to moderate.  Denies N/V/CP/SOB. States she has no appetite.  Objective:   VITALS:   Vitals:   11/15/15 1510 11/15/15 2100 11/16/15 0027 11/16/15 0602  BP: 132/66 139/75 114/64 (!) 108/57  Pulse:  70 75 (!) 56  Resp: 15 16 16 16   Temp: 97.5 F (36.4 C) 97.7 F (36.5 C) 98.1 F (36.7 C) 97.5 F (36.4 C)  TempSrc: Oral Oral Oral Oral  SpO2: 100% 100% 100% 100%  Weight:      Height:        ABD soft Sensation intact distally Intact pulses distally Dorsiflexion/Plantar flexion intact Incision: dressing C/D/I Compartment soft   Lab Results  Component Value Date   WBC 9.0 11/16/2015   HGB 8.6 (L) 11/16/2015   HCT 26.0 (L) 11/16/2015   MCV 83.3 11/16/2015   PLT 128 (L) 11/16/2015   BMET    Component Value Date/Time   NA 138 11/16/2015 0502   NA 136 05/17/2013 1525   K 3.8 11/16/2015 0502   K 4.2 05/17/2013 1525   CL 111 11/16/2015 0502   CL 107 05/17/2013 1525   CO2 20 (L) 11/16/2015 0502   CO2 26 05/17/2013 1525   GLUCOSE 97 11/16/2015 0502   GLUCOSE 85 05/17/2013 1525   BUN 10 11/16/2015 0502   BUN 10 05/17/2013 1525   CREATININE 0.72 11/16/2015 0502   CREATININE 0.82 05/17/2013 1525   CALCIUM 8.4 (L) 11/16/2015 0502   CALCIUM 8.7 05/17/2013 1525   GFRNONAA >60 11/16/2015 0502   GFRNONAA >60 05/17/2013 1525   GFRAA >60 11/16/2015 0502   GFRAA >60 05/17/2013 1525     Assessment/Plan: 1 Day Post-Op   Principal Problem:   Osteoarthritis of both hips Active Problems:   Avascular necrosis of bones of both hips ([place])   WBAT BLE with walker DVT ppx: apixaban, SCDs, TEDs PT/OT ABLA: received 2 units PRBCs intraop, hgb 8.6 today, monitor PO pain control Dispo: d/c home tomorrow vs Thursday with HHPT   Tamsen Reist, [name] 11/16/2015, 7:49 AM   Rod Can, MD Cell [phone]

## 2015-11-17 ENCOUNTER — Encounter (HOSPITAL_COMMUNITY): Payer: Self-pay

## 2015-11-17 LAB — CBC
HCT: 24.9 % — ABNORMAL LOW (ref 36.0–46.0)
HEMOGLOBIN: 8 g/dL — AB (ref 12.0–15.0)
MCH: 27.2 pg (ref 26.0–34.0)
MCHC: 32.1 g/dL (ref 30.0–36.0)
MCV: 84.7 fL (ref 78.0–100.0)
Platelets: 134 10*3/uL — ABNORMAL LOW (ref 150–400)
RBC: 2.94 MIL/uL — AB (ref 3.87–5.11)
RDW: 16.4 % — ABNORMAL HIGH (ref 11.5–15.5)
WBC: 10.3 10*3/uL (ref 4.0–10.5)

## 2015-11-17 LAB — POCT I-STAT 4, (NA,K, GLUC, HGB,HCT)
GLUCOSE: 110 mg/dL — AB (ref 65–99)
Glucose, Bld: 119 mg/dL — ABNORMAL HIGH (ref 65–99)
HEMATOCRIT: 22 % — AB (ref 36.0–46.0)
HEMATOCRIT: 28 % — AB (ref 36.0–46.0)
HEMOGLOBIN: 7.5 g/dL — AB (ref 12.0–15.0)
Hemoglobin: 9.5 g/dL — ABNORMAL LOW (ref 12.0–15.0)
POTASSIUM: 2.7 mmol/L — AB (ref 3.5–5.1)
Potassium: 3.1 mmol/L — ABNORMAL LOW (ref 3.5–5.1)
SODIUM: 140 mmol/L (ref 135–145)
Sodium: 141 mmol/L (ref 135–145)

## 2015-11-17 NOTE — Progress Notes (Signed)
   Subjective:  Patient reports pain as mild to moderate.  Denies N/V/CP/SOB. Feeling better today. Had nausea earlier this am, responded to meds.  Objective:   VITALS:   Vitals:   11/16/15 0602 11/16/15 1500 11/16/15 2205 11/17/15 0500  BP: (!) 108/57 (!) 124/58 (!) 115/49 129/66  Pulse: (!) 56 76 76 70  Resp: 16 17 18 20   Temp: 97.5 F (36.4 C) 98.8 F (37.1 C) 98.5 F (36.9 C) 97.7 F (36.5 C)  TempSrc: Oral Oral Oral Oral  SpO2: 100% 93% 98% 97%  Weight:      Height:        ABD soft Sensation intact distally Intact pulses distally Dorsiflexion/Plantar flexion intact Incision: dressing C/D/I Compartment soft   Lab Results  Component Value Date   WBC 10.3 11/17/2015   HGB 8.0 (L) 11/17/2015   HCT 24.9 (L) 11/17/2015   MCV 84.7 11/17/2015   PLT 134 (L) 11/17/2015   BMET    Component Value Date/Time   NA 138 11/16/2015 0502   NA 136 05/17/2013 1525   K 3.8 11/16/2015 0502   K 4.2 05/17/2013 1525   CL 111 11/16/2015 0502   CL 107 05/17/2013 1525   CO2 20 (L) 11/16/2015 0502   CO2 26 05/17/2013 1525   GLUCOSE 97 11/16/2015 0502   GLUCOSE 85 05/17/2013 1525   BUN 10 11/16/2015 0502   BUN 10 05/17/2013 1525   CREATININE 0.72 11/16/2015 0502   CREATININE 0.82 05/17/2013 1525   CALCIUM 8.4 (L) 11/16/2015 0502   CALCIUM 8.7 05/17/2013 1525   GFRNONAA >60 11/16/2015 0502   GFRNONAA >60 05/17/2013 1525   GFRAA >60 11/16/2015 0502   GFRAA >60 05/17/2013 1525     Assessment/Plan: 2 Days Post-Op   Principal Problem:   Osteoarthritis of both hips Active Problems:   Avascular necrosis of bones of both hips ([facility])   WBAT BLE with walker DVT ppx: apixaban, SCDs, TEDs PT/OT ABLA: received 2 units PRBCs intraop, hgb 8 today, monitor PO pain control Dispo: check hgb tomorrow am, d/c home tomorrow with HHPT   Alicia Brennan, [name] 11/17/2015, 1:44 PM   Rod Can, MD Cell [phone]

## 2015-11-17 NOTE — Discharge Instructions (Signed)
Dr. Rod Can Joint Replacement Specialist [facility] [address], [place], [place] [address] [phone]  Information on my medicine - ELIQUIS (apixaban)  This medication education was reviewed with me or my healthcare representative as part of my discharge preparation.  The pharmacist that spoke with me during my hospital stay was:  Georgina Peer, [facility]  Why was Eliquis prescribed for you? Eliquis was prescribed for you to reduce the risk of blood clots forming after orthopedic surgery.    What do You need to know about Eliquis? Take your Eliquis TWICE DAILY - one tablet in the morning and one tablet in the evening with or without food.  It would be best to take the dose about the same time each day.  If you have difficulty swallowing the tablet whole please discuss with your pharmacist how to take the medication safely.  Take Eliquis exactly as prescribed by your doctor and DO NOT stop taking Eliquis without talking to the doctor who prescribed the medication.  Stopping without other medication to take the place of Eliquis may increase your risk of developing a clot.  After discharge, you should have regular check-up appointments with your healthcare provider that is prescribing your Eliquis.  What do you do if you miss a dose? If a dose of ELIQUIS is not taken at the scheduled time, take it as soon as possible on the same day and twice-daily administration should be resumed.  The dose should not be doubled to make up for a missed dose.  Do not take more than one tablet of ELIQUIS at the same time.  Important Safety Information A possible side effect of Eliquis is bleeding. You should call your healthcare provider right away if you experience any of the following: ? Bleeding from an injury or your nose that does not stop. ? Unusual colored urine (red or dark brown) or unusual colored stools (red or black). ? Unusual bruising for unknown  reasons. ? A serious fall or if you hit your head (even if there is no bleeding).  Some medicines may interact with Eliquis and might increase your risk of bleeding or clotting while on Eliquis. To help avoid this, consult your healthcare provider or pharmacist prior to using any new prescription or non-prescription medications, including herbals, vitamins, non-steroidal anti-inflammatory drugs (NSAIDs) and supplements.  This website has more information on Eliquis (apixaban): http://www.eliquis.com/eliquis/home    TOTAL HIP REPLACEMENT POSTOPERATIVE DIRECTIONS    Hip Rehabilitation, Guidelines Following Surgery   WEIGHT BEARING Weight bearing as tolerated with assist device (walker, cane, etc) as directed, use it as long as suggested by your surgeon or therapist, typically at least 4-6 weeks.  The results of a hip operation are greatly improved after range of motion and muscle strengthening exercises. Follow all safety measures which are given to protect your hip. If any of these exercises cause increased pain or swelling in your joint, decrease the amount until you are comfortable again. Then slowly increase the exercises. Call your caregiver if you have problems or questions.   HOME CARE INSTRUCTIONS  Most of the following instructions are designed to prevent the dislocation of your new hip.  Remove items at home which could result in a fall. This includes throw rugs or furniture in walking pathways.  Continue medications as instructed at time of discharge.  You may have some home medications which will be placed on hold until you complete the course of blood thinner medication.  You may start  showering once you are discharged home. Do not remove your dressing. Do not put on socks or shoes without following the instructions of your caregivers.   Sit on chairs with arms. Use the chair arms to help push yourself up when arising.  Arrange for the use of a toilet seat elevator so you  are not sitting low.   Walk with walker as instructed.  You may resume a sexual relationship in one month or when given the OK by your caregiver.  Use walker as long as suggested by your caregivers.  You may put full weight on your legs and walk as much as is comfortable. Avoid periods of inactivity such as sitting longer than an hour when not asleep. This helps prevent blood clots.  You may return to work once you are cleared by [employer], [employer].  Do not drive a car for 6 weeks or until released by your surgeon.  Do not drive while taking narcotics.  Wear elastic stockings for two weeks following surgery during the day but you may remove then at night.  Make sure you keep all of your appointments after your operation with all of your doctors and caregivers. You should call the office at the above phone number and make an appointment for approximately two weeks after the date of your surgery. Please pick up a stool softener and laxative for home use as long as you are requiring pain medications.  ICE to the affected hip every three hours for 30 minutes at a time and then as needed for pain and swelling. Continue to use ice on the hip for pain and swelling from surgery. You may notice swelling that will progress down to the foot and ankle.  This is normal after surgery.  Elevate the leg when you are not up walking on it.   It is important for you to complete the blood thinner medication as prescribed by your doctor.  Continue to use the breathing machine which will help keep your temperature down.  It is common for your temperature to cycle up and down following surgery, especially at night when you are not up moving around and exerting yourself.  The breathing machine keeps your lungs expanded and your temperature down.  RANGE OF MOTION AND STRENGTHENING EXERCISES  These exercises are designed to help you keep full movement of your hip joint. Follow your caregiver's or physical therapist's  instructions. Perform all exercises about fifteen times, three times per day or as directed. Exercise both hips, even if you have had only one joint replacement. These exercises can be done on a training (exercise) mat, on the floor, on a table or on a bed. Use whatever works the best and is most comfortable for you. Use music or television while you are exercising so that the exercises are a pleasant break in your day. This will make your life better with the exercises acting as a break in routine you can look forward to.  Lying on your back, slowly slide your foot toward your buttocks, raising your knee up off the floor. Then slowly slide your foot back down until your leg is straight again.  Lying on your back spread your legs as far apart as you can without causing discomfort.  Lying on your side, raise your upper leg and foot straight up from the floor as far as is comfortable. Slowly lower the leg and repeat.  Lying on your back, tighten up the muscle in the front of your thigh (  quadriceps muscles). You can do this by keeping your leg straight and trying to raise your heel off the floor. This helps strengthen the largest muscle supporting your knee.  Lying on your back, tighten up the muscles of your buttocks both with the legs straight and with the knee bent at a comfortable angle while keeping your heel on the floor.   SKILLED REHAB INSTRUCTIONS: If the patient is transferred to a skilled rehab facility following release from the hospital, a list of the current medications will be sent to the facility for the patient to continue.  When discharged from the skilled rehab facility, please have the facility set up the patient's [place] prior to being released. Also, the skilled facility will be responsible for providing the patient with their medications at time of release from the facility to include their pain medication and their blood thinner medication. If the patient is still  at the rehab facility at time of the two week follow up appointment, the skilled rehab facility will also need to assist the patient in arranging follow up appointment in our office and any transportation needs.  MAKE SURE YOU:  Understand these instructions.  Will watch your condition.  Will get help right away if you are not doing well or get worse.  Pick up stool softner and laxative for home use following surgery while on pain medications. Do not remove your dressing. The dressing is waterproof--it is OK to take showers. Continue to use ice for pain and swelling after surgery. Do not use any lotions or creams on the incision until instructed by your surgeon. Total Hip Protocol.

## 2015-11-17 NOTE — Progress Notes (Signed)
Physical Therapy Treatment Patient Details Name: Alicia Brennan MRN: [id number] DOB: 04/23/1950 Today's Date: 11/17/2015    History of Present Illness 65 y.o. female now s/p bilateral direct anterior THA. PMH: hypertension, sarcoidosis of lung, seizures.    PT Comments    Pt making gradual progress, able to ambulate 90 feet with rw and min guard assistance. Anticipate pt will D/C to home as her mobility progresses. PT to continue to advance mobility as tolerated.   Follow Up Recommendations  Supervision for mobility/OOB;Home health PT     Equipment Recommendations  None recommended by PT    Recommendations for Other Services       Precautions / Restrictions Precautions Precautions: Fall Precaution Comments: seizures Restrictions Weight Bearing Restrictions: Yes RLE Weight Bearing: Weight bearing as tolerated LLE Weight Bearing: Weight bearing as tolerated    Mobility  Bed Mobility Overal bed mobility: Needs Assistance Bed Mobility: Sit to Supine     Supine to sit: Min assist     General bed mobility comments: in chair upon arrival  Transfers Overall transfer level: Needs assistance Equipment used: Rolling walker (2 wheeled) Transfers: Sit to/from Stand Sit to Stand: Min assist         General transfer comment: cues for hand position, encouraging hip flexion with sitting.   Ambulation/Gait Ambulation/Gait assistance: Min guard Ambulation Distance (Feet): 90 Feet Assistive device: Rolling walker (2 wheeled) Gait Pattern/deviations: Step-through pattern Gait velocity: decreased   General Gait Details: encouraging even stride length   Stairs            Wheelchair Mobility    Modified Rankin (Stroke Patients Only)       Balance Overall balance assessment: Needs assistance Sitting-balance support: No upper extremity supported Sitting balance-Leahy Scale: Fair     Standing balance support: During functional activity Standing balance-Leahy  Scale: Fair                      Cognition Arousal/Alertness: Awake/alert Behavior During Therapy: WFL for tasks assessed/performed Overall Cognitive Status: Within Functional Limits for tasks assessed                      Exercises Total Joint Exercises Ankle Circles/Pumps: AROM;Both;15 reps Quad Sets: Strengthening;Both;10 reps Short Arc Quad: Strengthening;Both;10 reps Heel Slides: AAROM;Both;10 reps Hip ABduction/ADduction: Strengthening;Both;10 reps    General Comments        Pertinent Vitals/Pain Pain Assessment: 0-10 Pain Score: 5  Pain Location: bilateral hips R>L Pain Descriptors / Indicators: Sore Pain Intervention(s): Limited activity within patient's tolerance;Monitored during session    Home Living                      Prior Function            PT Goals (current goals can now be found in the care plan section) Acute Rehab PT Goals Patient Stated Goal: move better PT Goal Formulation: With patient Time For Goal Achievement: 11/30/15 Potential to Achieve Goals: Good Progress towards PT goals: Progressing toward goals    Frequency  7X/week    PT Plan Current plan remains appropriate    Co-evaluation             End of Session Equipment Utilized During Treatment: Gait belt Activity Tolerance: Patient tolerated treatment well Patient left: in chair;with call bell/phone within reach;with family/visitor present     Time: [id number] PT Time Calculation (min) (ACUTE ONLY): 31 min  Charges:  $Gait Training:  8-22 mins $Therapeutic Exercise: 8-22 mins                    G Codes:      Cassell Clement, PT, CSCS Pager [phone] Office [phone]  11/17/2015, 1:29 PM

## 2015-11-17 NOTE — Care Management Note (Signed)
Case Management Note  Patient Details  Name: Alicia Brennan MRN: [id number] Date of Birth: 08-Mar-1951  Subjective/Objective:   65 yr old female s/p bilateral hip arthroplasties.                 Action/Plan: Case manager spoke with patient concerning home health and DME. Patient was preoperatively setup with [facility]. Patient states she will have family support at discharge. DME has been delivered to patient's room.    Expected Discharge Date:  11/18/15               Expected Discharge Plan:  [place]  In-House Referral:  NA  Discharge planning Services  CM Consult  Post Acute Care Choice:  Home Health, Durable Medical Equipment Choice offered to:  Patient  DME Arranged:  3-N-1 DME Agency:  [place]:  PT, OT [place] Agency:  [facility] (now [facility])  Status of Service:  Completed, signed off  If discussed at [place] of Stay Meetings, dates discussed:    Additional Comments:  Ninfa Meeker, RN 11/17/2015, 11:45 AM

## 2015-11-17 NOTE — Progress Notes (Signed)
PT Progress Note  Pt making gradual gains with PT, increasing her ambulation distance and technique. Anticipate pt will D/C to home with family and HHPT services. Will need to attempt stairs prior to D/C.     11/17/15 1557  PT Visit Information  Last PT Received On 11/17/15  Assistance Needed +1  History of Present Illness 65 y.o. female now s/p bilateral direct anterior THA. PMH: hypertension, sarcoidosis of lung, seizures.  Subjective Data  Subjective Reports that she is going to be staying until tomorrow. Family is getting things ready for her.   Patient Stated Goal be able to spend time with grandkids.  Precautions  Precautions Fall  Precaution Comments seizures  Restrictions  Weight Bearing Restrictions Yes  RLE Weight Bearing WBAT  LLE Weight Bearing WBAT  Pain Assessment  Pain Assessment 0-10  Pain Score 5  Pain Location bilateral hips  Pain Descriptors / Indicators Sore  Pain Intervention(s) Limited activity within patient's tolerance;Monitored during session  Cognition  Arousal/Alertness Awake/alert  Behavior During Therapy WFL for tasks assessed/performed  Overall Cognitive Status Within Functional Limits for tasks assessed  Bed Mobility  Overal bed mobility Needs Assistance  Bed Mobility Supine to Sit;Sit to Supine  Supine to sit Min assist  Sit to supine Mod assist  General bed mobility comments min assist to bring LEs to EOB for supine to sit and mod assist with LEs for return to supine.   Transfers  Overall transfer level Needs assistance  Equipment used Rolling walker (2 wheeled)  Transfers Sit to/from Stand  Sit to Stand Min guard  General transfer comment needing time to transition to standing.   Ambulation/Gait  Ambulation/Gait assistance Min guard  Ambulation Distance (Feet) 110 Feet  Assistive device Rolling walker (2 wheeled)  Gait Pattern/deviations Step-through pattern  General Gait Details working on even strides   Gait velocity decreased   Balance  Overall balance assessment Needs assistance  Sitting-balance support No upper extremity supported  Sitting balance-Leahy Scale Fair  Standing balance support Bilateral upper extremity supported  Standing balance-Leahy Scale Poor  PT - End of Session  Equipment Utilized During Treatment Gait belt  Activity Tolerance Patient tolerated treatment well  Patient left in bed;with call bell/phone within reach;with SCD's reapplied  Nurse Communication Mobility status;Weight bearing status  PT - Assessment/Plan  PT Plan Current plan remains appropriate  PT Frequency (ACUTE ONLY) 7X/week  Follow Up Recommendations Supervision for mobility/OOB;Home health PT  PT equipment None recommended by PT  PT Goal Progression  Progress towards PT goals Progressing toward goals  Acute Rehab PT Goals  PT Goal Formulation With patient  Time For Goal Achievement 11/30/15  Potential to Achieve Goals Good  PT Time Calculation  PT Start Time (ACUTE ONLY) 1535  PT Stop Time (ACUTE ONLY) 1556  PT Time Calculation (min) (ACUTE ONLY) 21 min  PT General Charges  $$ ACUTE PT VISIT 1 Procedure  PT Treatments  $Gait Training 8-22 mins  Cassell Clement, PT, [place] Pager [phone] Office [phone]

## 2015-11-17 NOTE — Progress Notes (Addendum)
Occupational Therapy Treatment Patient Details Name: Alicia Brennan MRN: [id number] DOB: 03/12/1951 Today's Date: 11/17/2015    History of present illness 65 y.o. female now s/p bilateral direct anterior THA. PMH: hypertension, sarcoidosis of lung, seizures.   OT comments  Pt making progress with functional goals. Pt participated in ADLs seated and standing at sink this morning. OT will continue to follow acutely  Follow Up Recommendations  Home health OT;Supervision - Intermittent    Equipment Recommendations  Other (comment);3 in 1 bedside comode (ADL A/E kit)    Recommendations for Other Services      Precautions / Restrictions Precautions Precautions: Fall;Other (comment) Precaution Comments: seizures Restrictions Weight Bearing Restrictions: Yes RLE Weight Bearing: Weight bearing as tolerated LLE Weight Bearing: Weight bearing as tolerated       Mobility Bed Mobility Overal bed mobility: Needs Assistance Bed Mobility: Sit to Supine     Supine to sit: Min assist     General bed mobility comments: min A needed with LEs to EOB and to elevate trunk, pt used rails  Transfers Overall transfer level: Needs assistance Equipment used: Rolling walker (2 wheeled) Transfers: Sit to/from Stand Sit to Stand: Min assist;Mod assist              Balance     Sitting balance-Leahy Scale: Fair     Standing balance support: During functional activity Standing balance-Leahy Scale: Poor                     ADL       Grooming: Wash/dry hands;Wash/dry face;Standing;Min guard   Upper Body Bathing: Min guard;Standing   Lower Body Bathing: Moderate assistance;Sit to/from stand;Sitting/lateral leans   Upper Body Dressing : Min guard;Standing   Lower Body Dressing: Maximal assistance   Toilet Transfer: RW;Ambulation;Comfort height toilet;Minimal assistance;Moderate assistance;Cueing for safety   Toileting- Clothing Manipulation and Hygiene: Sit to/from  stand;Minimal assistance   Tub/ Shower Transfer: 3 in 1;Rolling walker;Ambulation;Cueing for safety;Minimal assistance;Moderate assistance   Functional mobility during ADLs: Moderate assistance;Cueing for safety;Minimal assistance General ADL Comments: initiated education on ADL A/E for home use, will demo next session      Vision  no change from baseline                              Cognition   Behavior During Therapy: WFL for tasks assessed/performed Overall Cognitive Status: Within Functional Limits for tasks assessed                                                 General Comments  pt very pleasant and cooperative, talkative    Pertinent Vitals/ Pain       Pain Assessment: 0-10 Pain Score: 4  Pain Location: B hips Pain Descriptors / Indicators: Sore Pain Intervention(s): Monitored during session;Repositioned                                            Prior Functioning/Environment   independent           Frequency Min 2X/week     Progress Toward Goals  OT Goals(current goals can now be found in the care plan section)  Progress towards OT  goals: Progressing toward goals     Plan Discharge plan remains appropriate                     End of Session Equipment Utilized During Treatment: Gait belt;Rolling walker;Other (comment) (3 in 1)   Activity Tolerance Patient tolerated treatment well   Patient Left with call bell/phone within reach;in chair             Time: 8325-4982 OT Time Calculation (min): 42 min  Charges: OT General Charges $OT Visit: 1 Procedure OT Treatments $Self Care/Home Management : 23-37 mins $Therapeutic Activity: 8-22 mins  Britt Bottom 11/17/2015, 1:02 PM

## 2015-11-18 ENCOUNTER — Encounter (HOSPITAL_COMMUNITY): Payer: Self-pay | Admitting: Orthopedic Surgery

## 2015-11-18 LAB — CBC
HEMATOCRIT: 23.5 % — AB (ref 36.0–46.0)
Hemoglobin: 7.6 g/dL — ABNORMAL LOW (ref 12.0–15.0)
MCH: 27.6 pg (ref 26.0–34.0)
MCHC: 32.3 g/dL (ref 30.0–36.0)
MCV: 85.5 fL (ref 78.0–100.0)
PLATELETS: 129 10*3/uL — AB (ref 150–400)
RBC: 2.75 MIL/uL — AB (ref 3.87–5.11)
RDW: 16.3 % — AB (ref 11.5–15.5)
WBC: 8.3 10*3/uL (ref 4.0–10.5)

## 2015-11-18 NOTE — Care Management Important Message (Signed)
Important Message  Patient Details  Name: Alicia Brennan MRN: [id number] Date of Birth: 11/11/1950   Medicare Important Message Given:  Yes    Alicia Brennan 11/18/2015, 10:00 AM

## 2015-11-18 NOTE — Progress Notes (Signed)
Alicia Brennan to be D/C'd Home per MD order.  Discussed with the patient and all questions fully answered.  VSS, Skin clean, dry and intact without evidence of skin break down, no evidence of skin tears noted. IV catheter discontinued intact. Site without signs and symptoms of complications. Dressing and pressure applied.  An After Visit Summary was printed and given to the patient. Patient received prescription.  D/c education completed with patient/family including follow up instructions, medication list, d/c activities limitations if indicated, with other d/c instructions as indicated by MD - patient able to verbalize understanding, all questions fully answered.   Patient instructed to return to ED, call 911, or call MD for any changes in condition.   Patient escorted via [place], and D/C home via private auto.  Jerry Caras 11/18/2015 8:31 PM

## 2015-11-18 NOTE — Progress Notes (Signed)
Physical Therapy Treatment Patient Details Name: Alicia Brennan MRN: [id number] DOB: Aug 22, 1950 Today's Date: 11/18/2015    History of Present Illness 65 y.o. female now s/p bilateral direct anterior THA. PMH: hypertension, sarcoidosis of lung, seizures.    PT Comments    Session focused on gait and stairs. Pt able to ambulate 110 ft with rw and go up/down 4 stairs. Pt requiring time with mobility but able to perform both with good stability. Anticipate pt will D/C to home with family assist following acute stay.   Follow Up Recommendations  Supervision for mobility/OOB;Home health PT     Equipment Recommendations  None recommended by PT    Recommendations for Other Services       Precautions / Restrictions Precautions Precautions: Fall Precaution Comments: seizures Restrictions Weight Bearing Restrictions: Yes RLE Weight Bearing: Weight bearing as tolerated LLE Weight Bearing: Weight bearing as tolerated    Mobility  Bed Mobility               General bed mobility comments: in chair upon arrival  Transfers Overall transfer level: Needs assistance Equipment used: Rolling walker (2 wheeled) Transfers: Sit to/from Stand Sit to Stand: Min guard         General transfer comment: pt transitioning slowly, cues for hip flexion provided.   Ambulation/Gait Ambulation/Gait assistance: Min guard Ambulation Distance (Feet): 110 Feet Assistive device: Rolling walker (2 wheeled) Gait Pattern/deviations: Step-through pattern;Decreased step length - right;Decreased step length - left;Trunk flexed Gait velocity: decreased   General Gait Details: cues for even strides and posture.    Stairs Stairs: Yes Stairs assistance: Min guard Stair Management: One rail Left;Step to pattern;Sideways Number of Stairs: 4 General stair comments: Pt slow but stable using rail. Denies concerns about stairs at home.   Wheelchair Mobility    Modified Rankin (Stroke Patients Only)        Balance Overall balance assessment: Needs assistance Sitting-balance support: No upper extremity supported Sitting balance-Leahy Scale: Fair     Standing balance support: Bilateral upper extremity supported Standing balance-Leahy Scale: Poor Standing balance comment: using rw                    Cognition Arousal/Alertness: Awake/alert Behavior During Therapy: WFL for tasks assessed/performed Overall Cognitive Status: Within Functional Limits for tasks assessed                      Exercises      General Comments        Pertinent Vitals/Pain Pain Assessment: Faces Faces Pain Scale: Hurts even more Pain Location: bilateral hips Pain Descriptors / Indicators: Sore Pain Intervention(s): Limited activity within patient's tolerance;Monitored during session;Ice applied    Home Living                      Prior Function            PT Goals (current goals can now be found in the care plan section) Acute Rehab PT Goals Patient Stated Goal: be able to spend time with grandkids. PT Goal Formulation: With patient Time For Goal Achievement: 11/30/15 Potential to Achieve Goals: Good Progress towards PT goals: Progressing toward goals    Frequency  7X/week    PT Plan Current plan remains appropriate    Co-evaluation             End of Session Equipment Utilized During Treatment: Gait belt Activity Tolerance: Patient tolerated treatment well Patient left: in chair;with  call bell/phone within reach     Time: 1010-1048 PT Time Calculation (min) (ACUTE ONLY): 38 min  Charges:  $Gait Training: 38-52 mins                    G Codes:      Cassell Clement, PT, CSCS Pager [phone] Office [phone]  11/18/2015, 10:54 AM

## 2015-11-18 NOTE — Progress Notes (Signed)
   Subjective:  Patient reports pain as mild to moderate.  Denies N/V/CP/SOB.  Objective:   VITALS:   Vitals:   11/16/15 2205 11/17/15 0500 11/18/15 0031 11/18/15 0500  BP: (!) 115/49 129/66 130/61 123/62  Pulse: 76 70 77 77  Resp: 18 20 18 18   Temp: 98.5 F (36.9 C) 97.7 F (36.5 C) 98.1 F (36.7 C) 98.4 F (36.9 C)  TempSrc: Oral Oral Oral Oral  SpO2: 98% 97% 100% 98%  Weight:      Height:        ABD soft Sensation intact distally Intact pulses distally Dorsiflexion/Plantar flexion intact Incision: dressing C/D/I Compartment soft   Lab Results  Component Value Date   WBC 8.3 11/18/2015   HGB 7.6 (L) 11/18/2015   HCT 23.5 (L) 11/18/2015   MCV 85.5 11/18/2015   PLT 129 (L) 11/18/2015   BMET    Component Value Date/Time   NA 138 11/16/2015 0502   NA 136 05/17/2013 1525   K 3.8 11/16/2015 0502   K 4.2 05/17/2013 1525   CL 111 11/16/2015 0502   CL 107 05/17/2013 1525   CO2 20 (L) 11/16/2015 0502   CO2 26 05/17/2013 1525   GLUCOSE 97 11/16/2015 0502   GLUCOSE 85 05/17/2013 1525   BUN 10 11/16/2015 0502   BUN 10 05/17/2013 1525   CREATININE 0.72 11/16/2015 0502   CREATININE 0.82 05/17/2013 1525   CALCIUM 8.4 (L) 11/16/2015 0502   CALCIUM 8.7 05/17/2013 1525   GFRNONAA >60 11/16/2015 0502   GFRNONAA >60 05/17/2013 1525   GFRAA >60 11/16/2015 0502   GFRAA >60 05/17/2013 1525     Assessment/Plan: 3 Days Post-Op   Principal Problem:   Osteoarthritis of both hips Active Problems:   Avascular necrosis of bones of both hips (HCC)   WBAT BLE with walker DVT ppx: apixaban, SCDs, TEDs PT/OT ABLA: received 2 units PRBCs intraop, hgb 7.6 today and asymptomatic, monitor PO pain control Dispo: d/c home with HHPT   Alicia Brennan, [name] 11/18/2015, 8:02 AM   Rod Can, MD Cell [phone]

## 2015-11-18 NOTE — Progress Notes (Signed)
Physical Therapy Treatment Patient Details Name: Alicia Brennan MRN: [id number] DOB: 01/16/1951 Today's Date: 11/18/2015    History of Present Illness 65 y.o. female now s/p bilateral direct anterior THA. PMH: hypertension, sarcoidosis of lung, seizures.    PT Comments    Pt making gradual progress with PT, anticipate D/C to home once released. Pt reports feeling confident with stairs and her ambulation. Pt declined performing her HEP today.   Follow Up Recommendations  Supervision for mobility/OOB;Home health PT     Equipment Recommendations  None recommended by PT    Recommendations for Other Services       Precautions / Restrictions Precautions Precautions: Fall Precaution Comments: seizures Restrictions Weight Bearing Restrictions: Yes RLE Weight Bearing: Weight bearing as tolerated LLE Weight Bearing: Weight bearing as tolerated    Mobility  Bed Mobility               General bed mobility comments: up in chair upon arrival.  Transfers Overall transfer level: Needs assistance Equipment used: Rolling walker (2 wheeled) Transfers: Sit to/from Stand Sit to Stand: Min guard         General transfer comment: continue to encourage hip flexion with transfers.   Ambulation/Gait Ambulation/Gait assistance: Min guard Ambulation Distance (Feet): 100 Feet Assistive device: Rolling walker (2 wheeled) Gait Pattern/deviations: Step-through pattern;Decreased step length - right;Decreased step length - left Gait velocity: decreased   General Gait Details: cues for even strides and posture.    Stairs Stairs: Yes Stairs assistance: Min guard Stair Management: One rail Left;Step to pattern;Sideways Number of Stairs: 4 General stair comments: Pt slow but stable using rail. Denies concerns about stairs at home.   Wheelchair Mobility    Modified Rankin (Stroke Patients Only)       Balance Overall balance assessment: Needs assistance Sitting-balance support:  No upper extremity supported Sitting balance-Leahy Scale: Fair     Standing balance support: Bilateral upper extremity supported Standing balance-Leahy Scale: Poor Standing balance comment: using rw                    Cognition Arousal/Alertness: Awake/alert Behavior During Therapy: WFL for tasks assessed/performed Overall Cognitive Status: Within Functional Limits for tasks assessed                      Exercises      General Comments        Pertinent Vitals/Pain Pain Assessment: 0-10 Pain Score: 5  Faces Pain Scale: Hurts even more Pain Location: bilateral hips L>R Pain Descriptors / Indicators: Aching;Sore Pain Intervention(s): Limited activity within patient's tolerance;Monitored during session    Home Living                      Prior Function            PT Goals (current goals can now be found in the care plan section) Acute Rehab PT Goals Patient Stated Goal: be able to spend time with grandkids. PT Goal Formulation: With patient Time For Goal Achievement: 11/30/15 Potential to Achieve Goals: Good Progress towards PT goals: Progressing toward goals    Frequency  7X/week    PT Plan Current plan remains appropriate    Co-evaluation             End of Session Equipment Utilized During Treatment: Gait belt Activity Tolerance: Patient limited by fatigue Patient left: with nursing/sitter in room (pt left with nursing staff in bathroom. )  Time: [id number] PT Time Calculation (min) (ACUTE ONLY): 25 min  Charges:  $Gait Training: 23-37 mins                    G Codes:      Cassell Clement, PT, CSCS Pager [phone] Office [phone]  11/18/2015, 2:23 PM

## 2015-11-19 LAB — TYPE AND SCREEN
ABO/RH(D): O POS
ANTIBODY SCREEN: NEGATIVE
UNIT DIVISION: 0
UNIT DIVISION: 0
UNIT DIVISION: 0
Unit division: 0
Unit division: 0
Unit division: 0

## 2015-11-20 ENCOUNTER — Other Ambulatory Visit: Payer: Self-pay | Admitting: Pulmonary Disease

## 2015-11-20 DIAGNOSIS — R911 Solitary pulmonary nodule: Secondary | ICD-10-CM

## 2015-11-20 DIAGNOSIS — I10 Essential (primary) hypertension: Secondary | ICD-10-CM

## 2015-11-20 DIAGNOSIS — D860 Sarcoidosis of lung: Secondary | ICD-10-CM

## 2015-11-24 ENCOUNTER — Other Ambulatory Visit: Payer: Self-pay | Admitting: Pulmonary Disease

## 2015-11-24 DIAGNOSIS — D860 Sarcoidosis of lung: Secondary | ICD-10-CM

## 2015-11-24 DIAGNOSIS — R911 Solitary pulmonary nodule: Secondary | ICD-10-CM

## 2015-11-24 DIAGNOSIS — I10 Essential (primary) hypertension: Secondary | ICD-10-CM

## 2015-11-26 ENCOUNTER — Ambulatory Visit: Payer: Medicare Other | Admitting: Neurology

## 2015-12-13 ENCOUNTER — Inpatient Hospital Stay: Admission: RE | Admit: 2015-12-13 | Payer: Medicare Other | Source: Ambulatory Visit

## 2015-12-15 ENCOUNTER — Ambulatory Visit: Payer: Medicare Other | Admitting: Pulmonary Disease

## 2015-12-27 ENCOUNTER — Telehealth: Payer: Self-pay | Admitting: Neurology

## 2015-12-27 NOTE — Telephone Encounter (Signed)
Letter ready to be picked up. Daughter advised of Keppra increase.

## 2015-12-27 NOTE — Telephone Encounter (Signed)
Patient's daughter was calling back to see about her work note. Her # is [phone]. Thank you

## 2015-12-27 NOTE — Telephone Encounter (Signed)
Minde daughter called and states that patient had a seizure on sat morning and it took her about hour to come out of it. Her daughter took off work and to stay with her mom and would like [place] to do a letter for work but she would like to talk to someone please call her at [phone] her daughter name is Building surveyor

## 2015-12-27 NOTE — Telephone Encounter (Signed)
Pls write letter for daughter, thanks. Pls ask if any triggers, did her mother miss any medications, any sleep deprivation or alcohol intake? If none, is she taking the Keppra XR 500mg  3 tabs qhs? Would increase to 4 tabs qhs and f/u in 2 months. Thanks

## 2015-12-27 NOTE — Telephone Encounter (Signed)
Daughter Griffin Dakin is wanting a letter to be excused from work today to care for mother. She also would like to know if her mother needs to be seen or if we need to make any medication changes?

## 2015-12-28 ENCOUNTER — Telehealth: Payer: Self-pay | Admitting: Neurology

## 2015-12-28 NOTE — Telephone Encounter (Signed)
Rx Keppra XR 500mg  4 tabs qhs called in to pharmacy. Pt notified.

## 2015-12-28 NOTE — Telephone Encounter (Signed)
PT called and needs her prescription to be called in/Dawn CB# [phone]

## 2016-01-30 ENCOUNTER — Other Ambulatory Visit: Payer: Self-pay | Admitting: Neurology

## 2016-01-30 DIAGNOSIS — G40009 Localization-related (focal) (partial) idiopathic epilepsy and epileptic syndromes with seizures of localized onset, not intractable, without status epilepticus: Secondary | ICD-10-CM

## 2016-01-31 ENCOUNTER — Telehealth: Payer: Self-pay | Admitting: Neurology

## 2016-01-31 NOTE — Telephone Encounter (Signed)
Contacted patient and she wanted to be sure we got pharmacy request for the Keppra 4 times at night sent in. Notified this was sent.

## 2016-01-31 NOTE — Telephone Encounter (Signed)
Patient needs to talk to someone about medication please call [phone]

## 2016-01-31 NOTE — Telephone Encounter (Signed)
RX sent to pharmacy  

## 2016-02-03 ENCOUNTER — Telehealth: Payer: Self-pay | Admitting: Neurology

## 2016-02-03 NOTE — Telephone Encounter (Signed)
Contacted patient and she states she is having really bad neck and back pain. She states, "I fell on 12/25/15 and I think  this could be where pain in my back and neck are coming from. I am taking physical therapy and they state it could be coming from having both hips replaced." Patient also states she is also having bad headaches. She states she has a follow-up 02/08/16 but wants Dr. Delice Lesch aware of these things before her appointment and to see if she recommends any testing be done prior to her OV?

## 2016-02-03 NOTE — Telephone Encounter (Signed)
Patient wants to talk to someone about the questions she has please call [phone]

## 2016-02-04 NOTE — Telephone Encounter (Signed)
Patient notified and states she will contact her Ortho specialist.

## 2016-02-04 NOTE — Telephone Encounter (Signed)
Who is seeing her for the neck and back pain? If no one, pls have her call her Ortho specialist. The headaches are most likely related to neck pain, so treating neck pain may help with the headaches.

## 2016-02-08 ENCOUNTER — Encounter: Payer: Self-pay | Admitting: Neurology

## 2016-02-08 ENCOUNTER — Ambulatory Visit (INDEPENDENT_AMBULATORY_CARE_PROVIDER_SITE_OTHER): Payer: Medicare Other | Admitting: Neurology

## 2016-02-08 VITALS — BP 138/78 | HR 73 | Ht 59.0 in | Wt 136.1 lb

## 2016-02-08 DIAGNOSIS — G40009 Localization-related (focal) (partial) idiopathic epilepsy and epileptic syndromes with seizures of localized onset, not intractable, without status epilepticus: Secondary | ICD-10-CM | POA: Diagnosis not present

## 2016-02-08 DIAGNOSIS — M542 Cervicalgia: Secondary | ICD-10-CM

## 2016-02-08 MED ORDER — CYCLOBENZAPRINE HCL 5 MG PO TABS: ORAL_TABLET | ORAL | 5 refills | Status: DC

## 2016-02-08 MED ORDER — LEVETIRACETAM ER 500 MG PO TB24: ORAL_TABLET | ORAL | 6 refills | Status: DC

## 2016-02-08 NOTE — Progress Notes (Signed)
NEUROLOGY FOLLOW UP OFFICE NOTE  Alicia Brennan [id number]  HISTORY OF PRESENT ILLNESS: I had the pleasure of seeing Alicia Brennan in follow-up in the neurology clinic on 02/08/2016. The patient was last seen 3 months ago after new onset convulsion on 04/25/15. On further questioning, she had been having episodes of staring and unresponsiveness with hand automatisms and lip smacking for the past year. MRI brain, routine and 24-hour EEG unremarkable. She had another episode of fall with loss of consciousness in June, no convulsive activity seen. Keppra dose increased to 1500mg  qhs. Her family called our office to report another seizure on 12/25/15, she was alone and recalls going to the bathroom at 430am, then woke up on the floor. She denied any warning symptoms, and does not think she was confused when she woke up. Keppra XR dose increased to 2000mg  qhs. She denies any significant side effects, sometimes she feels a little anxious or irritable. She has had occasional neck and back pain since the fall, and noticed her neck is "cracking" more on the left side. She has had more headaches over the bilateral parietal regions, and the left occipital region is sore. No nausea/vomiting, focal numbness/tingling/weakness. Her hands occasionally get stiff with the arthritis. She reports a lot of stress with her children. She will be travelling to [place] next week for the holidays.   HPI: This is a pleasant 65 yo RH woman with a history of hypertension, pulmonary sarcoidosis, anal cancer, with new onset convulsion on 04/25/2015. She has no recollection of events and had a seizure in her sleep, her daughter woke up at 4am hearing her fall on the floor with full body jerking, blood and spit coming out of her mouth, with urinary incontinence. Seizure lasted a few minutes and she was sleepy on EMS arrival. She was admitted to [facility] where she had a normal CBC, BMP showed a potassium of 2.6, glucose 126, chloride 97,  elevated lactic acid 3.68. UDS and EtOH level negative. I personally reviewed head CT which did not show any acute changes, there was mild diffuse atrophy. She was discharged home on Keppra 500mg  BID.  The patient and her son report that she has been having episodes of staring and unresponsiveness for over a year, she would have hand automatisms ("playing with her fingers") and possibly lip smacking and ?head turn to the right. Her son has only witnessed one episode, she lives with her daughter who sees them more, occurring around twice a week or more. They last only a couple of minutes. They report that she had some staring episodes the day and evening prior to the convulsion. She reports that these can be preceded by feeling sick in her stomach and "chills." She has noticed gaps in time herself, but when family is calling her after an episode, she would be unaware and ask what was wrong. She denies any olfactory/gustatory hallucinations, deja vu, focal numbness/tingling/weakness, myoclonic jerks. She had seen neurologist Dr. Melrose Nakayama in August 2016 for frequent headaches and staring off. There is an MRI brain without contrast report from 06/30/14 which did not show any acute changes, moderate for age nonspecific signal changes in the cerebral white matter, most commonly due to chronic small vessel disease, multilevel severe cervical spine disc and endplate degeneration with suspected spinal stenosis. MRI C-spine done 07/17/14 reported marked multilevel degenerative disc disease with moderate central canal stenosis at levels C3-4, C4-5, C6-7 and severe right neural foraminal narrowing at C4-5. Her routine wake and sleep EEG  done 11/25/14 was normal. She was started on nortriptyline for the frequent headaches but has not been taking this regularly. [name]   Epilepsy Risk Factors: She reports having a fall in 2010 with "bleeding in the brain." No neurosurgical procedures done. Otherwise she had a normal birth and early  development. There is no history of febrile convulsions, CNS infections such as meningitis/encephalitis, or family history of seizures.  PAST MEDICAL HISTORY: Past Medical History:  Diagnosis Date  . Arthritis   . Cancer [facility]) 2008   anal  . Hypertension   . IBS (irritable bowel syndrome)   . IBS (irritable bowel syndrome)   . Leg fracture, left   . Recurrent headache   . Sarcoidosis of lung ([place])   . Seizures ([place])     MEDICATIONS: Current Outpatient Prescriptions on File Prior to Visit  Medication Sig Dispense Refill  . amLODipine (NORVASC) 10 MG tablet Take 10 mg by mouth daily.    . Cholecalciferol (VITAMIN D3) 50000 units TABS Take 50,000 Units by mouth every Monday.     . docusate sodium (COLACE) 100 MG capsule Take 1 capsule (100 mg total) by mouth 2 (two) times daily. 60 capsule 3  . HYDROcodone-acetaminophen (NORCO/VICODIN) 5-325 MG tablet Take 1-2 tablets by mouth every 4 (four) hours as needed (breakthrough pain). 80 tablet 0  . levETIRAcetam (KEPPRA XR) 500 MG 24 hr tablet TAKE FOUR TABLETS BY MOUTH IN THE EVENING 120 tablet 2  . losartan (COZAAR) 100 MG tablet Take 1 tablet (100 mg total) by mouth daily. 30 tablet 0  . losartan (COZAAR) 100 MG tablet TAKE ONE TABLET BY MOUTH ONCE DAILY 30 tablet 5  . senna (SENOKOT) 8.6 MG TABS tablet Take 2 tablets (17.2 mg total) by mouth at bedtime. 60 each 3   No current facility-administered medications on file prior to visit.     ALLERGIES: Allergies  Allergen Reactions  . No Known Allergies     FAMILY HISTORY: Family History  Problem Relation Age of Onset  . Dementia Mother   . High blood pressure Father   . Dementia Maternal Aunt   . Sarcoidosis Son   . Seizures Neg Hx     SOCIAL HISTORY: Social History   Social History  . Marital status: Single    Spouse name: N/A  . Number of children: 3  . Years of education: N/A   Occupational History  . Retired    Social History Main Topics  . Smoking status:  Never Smoker  . Smokeless tobacco: Never Used  . Alcohol use No  . Drug use: No  . Sexual activity: Not on file   Other Topics Concern  . Not on file   Social History Narrative  . No narrative on file    REVIEW OF SYSTEMS: Constitutional: No fevers, chills, or sweats, no generalized fatigue, change in appetite Eyes: No visual changes, double vision, eye pain Ear, nose and throat: No hearing loss, ear pain, nasal congestion, sore throat Cardiovascular: No chest pain, palpitations Respiratory:  No shortness of breath at rest or with exertion, wheezes GastrointestinaI: No nausea, vomiting, diarrhea, abdominal pain, fecal incontinence Genitourinary:  no dysuria, no urinary retention or frequency Musculoskeletal:  No neck pain, back pain, +bilateral hip pain Integumentary: No rash, pruritus, skin lesions Neurological: as above Psychiatric: No depression, insomnia, anxiety Endocrine: No palpitations, fatigue, diaphoresis, mood swings, change in appetite, change in weight, increased thirst Hematologic/Lymphatic:  No anemia, purpura, petechiae. Allergic/Immunologic: no itchy/runny eyes, nasal congestion, recent allergic  reactions, rashes  PHYSICAL EXAM: Vitals:   02/08/16 1424  BP: 138/78  Pulse: 73   General: No acute distress, sitting in wheelchair Head:  Normocephalic/atraumatic Neck: supple, no paraspinal tenderness, full range of motion Heart:  Regular rate and rhythm Lungs:  Clear to auscultation bilaterally Back: No paraspinal tenderness Skin/Extremities: No rash, no edema Neurological Exam: alert and oriented to person, place, and time. No aphasia or dysarthria. Fund of knowledge is appropriate.  Recent and remote memory are intact.  Attention and concentration are normal.    Able to name objects and repeat phrases. Cranial nerves: Pupils equal, round, reactive to light. Extraocular movements intact with no nystagmus. Visual fields full. Facial sensation intact. No facial  asymmetry. Tongue, uvula, palate midline.  Motor: Bulk and tone normal, muscle strength 5/5 throughout with no pronator drift.  Sensation to light touch intact.  No extinction to double simultaneous stimulation.  Deep tendon reflexes 2+ throughout, toes downgoing.  Finger to nose testing intact.  Gait not tested, in pain due to hip pain.  IMPRESSION: This is a pleasant 65 yo RH woman with a history of hypertension, pulmonary sarcoidosis, with new onset generalized tonic-clonic seizure on 04/25/2015. 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 MRI brain done in April 2016 was reported as unremarkable, routine and 24-hour EEG normal. She has had 2 unwitnessed falls, in June and most recently last 12/25/15. She was back to baseline soon after. She is now on higher dose Keppra XR 500mg  4 tabs qhs (2000mg  qhs) with no significant side effects. She is having occasional headaches, neck and back pain since the fall. Neurological exam normal. She was given prn Flexeril 5mg  to take every 12 hours as needed, side effects were discussed. She does not drive and is aware of [place] driving laws to stop driving after a seizure/episode of loss of consciousness, until 6 months seizure-free. She will follow-up in 3 months and knows to call for any changes.   Thank you for allowing me to participate in her care.  Please do not hesitate to call for any questions or concerns.  The duration of this appointment visit was 25 minutes of face-to-face time with the patient.  Greater than 50% of this time was spent in counseling, explanation of diagnosis, planning of further management, and coordination of care.   Alicia Brennan, M.D.   CC: Threasa Alpha, FNP

## 2016-02-08 NOTE — Patient Instructions (Signed)
1. Continue Keppra XR 500mg : Take 4 tablets at night 2. Take Flexeril 5mg  as needed every 12 hours for neck or back pain 3. Follow-up in 3 months, call for any changes  Seizure Precautions: 1. If medication has been prescribed for you to prevent seizures, take it exactly as directed.  Do not stop taking the medicine without talking to your doctor first, even if you have not had a seizure in a long time.   2. Avoid activities in which a seizure would cause danger to yourself or to others.  Don't operate dangerous machinery, swim alone, or climb in high or dangerous places, such as on ladders, roofs, or girders.  Do not drive unless your doctor says you may.  3. If you have any warning that you may have a seizure, lay down in a safe place where you can't hurt yourself.    4.  No driving for 6 months from last seizure, as per [facility].   Please refer to the following link on the [place] website for more information: http://[email or web address].org/answerplace/Social/driving/drivingu.cfm   5.  Maintain good sleep hygiene. Avoid alcohol.  6.  Contact your doctor if you have any problems that may be related to the medicine you are taking.  7.  Call 911 and bring the patient back to the ED if:        A.  The seizure lasts longer than 5 minutes.       B.  The patient doesn't awaken shortly after the seizure  C.  The patient has new problems such as difficulty seeing, speaking or moving  D.  The patient was injured during the seizure  E.  The patient has a temperature over 102 F (39C)  F.  The patient vomited and now is having trouble breathing

## 2016-04-06 ENCOUNTER — Telehealth: Payer: Self-pay | Admitting: Neurology

## 2016-04-06 NOTE — Telephone Encounter (Signed)
Patient states that she is needing to talk to someone about seizure medication please call [phone]

## 2016-04-06 NOTE — Telephone Encounter (Signed)
Contacted patient. She states she is feeling light headed, loopy, and doing a lot of sleeping. She is wondering if its because she is taking Keppra 500mg  4 tablets at night. She has tried taking 3 tablets and feels somewhat better. Patient states she still has a lot of person things going on too so it could be due to stress. She has an OV next months but was wondering what Dr. Delice Lesch advises until then.

## 2016-04-07 NOTE — Telephone Encounter (Signed)
Notified patient of below. She states she will call if she has a seizure.

## 2016-04-07 NOTE — Telephone Encounter (Signed)
Ok to go back to 3 tablets until I see her. Call if any seizures. Thanks

## 2016-04-28 ENCOUNTER — Ambulatory Visit (INDEPENDENT_AMBULATORY_CARE_PROVIDER_SITE_OTHER): Payer: Medicare Other | Admitting: Neurology

## 2016-04-28 ENCOUNTER — Encounter: Payer: Self-pay | Admitting: Neurology

## 2016-04-28 ENCOUNTER — Telehealth: Payer: Self-pay | Admitting: Neurology

## 2016-04-28 ENCOUNTER — Other Ambulatory Visit: Payer: Self-pay

## 2016-04-28 VITALS — BP 156/82 | HR 72 | Ht 59.0 in | Wt 136.2 lb

## 2016-04-28 DIAGNOSIS — G40009 Localization-related (focal) (partial) idiopathic epilepsy and epileptic syndromes with seizures of localized onset, not intractable, without status epilepticus: Secondary | ICD-10-CM | POA: Diagnosis not present

## 2016-04-28 MED ORDER — ZONISAMIDE 100 MG PO CAPS: ORAL_CAPSULE | ORAL | 5 refills | Status: DC

## 2016-04-28 MED ORDER — LEVETIRACETAM ER 500 MG PO TB24: ORAL_TABLET | ORAL | 6 refills | Status: DC

## 2016-04-28 NOTE — Telephone Encounter (Signed)
Verified with patient she needs Keppra called in. RX sent to pharmacy, per today's OV note patient to continue.

## 2016-04-28 NOTE — Telephone Encounter (Signed)
PT called and said she needs her seizure medication called in/Dawn CB# [phone]

## 2016-04-28 NOTE — Progress Notes (Signed)
NEUROLOGY FOLLOW UP OFFICE NOTE  Alicia Brennan ZN:[id number]  HISTORY OF PRESENT ILLNESS: I had the pleasure of seeing Alicia Brennan in follow-up in the neurology clinic on 04/28/2016. The patient was last seen 3 months ago after new onset convulsion on 04/25/15. On further questioning, she had been having episodes of staring and unresponsiveness with hand automatisms and lip smacking for the past year. MRI brain, routine and 24-hour EEG unremarkable. She had another episode of fall with loss of consciousness in June 2017, no convulsive activity seen. Keppra dose increased to 1500mg  qhs. She had another episode in September 2017 where she woke up on the floor, Keppra XR dose increased to 2000mg  qhs. She self-reduced to 1500mg  qhs due to side effects of drowsiness, lightheadedness and loopiness. Since her last visit, she reports 2 "mini" ones in one day last 04/25/16. She is amnestic of them, recalls standing up, then coming to with everyone standing around her. Her family reported she was unresponsive, fumbling with her hands. They moved her and sat her down, she had 2 back to back. She recalls having chills the week prior. She denies any other gaps in time but has noticed memory loss, "going in and out." Sometimes she is just in a daze and does a lot of sleeping. She denies any olfactory/gustatory hallucinations, myoclonic jerks, focal numbness/tingling/weakness. She has some blurred vision. Her left knee and right outer thigh are sore. She fell this morning trying to get past her mattress.   HPI: This is a pleasant 66 yo RH woman with a history of hypertension, pulmonary sarcoidosis, anal cancer, with new onset convulsion on 04/25/2015. She has no recollection of events and had a seizure in her sleep, her daughter woke up at 4am hearing her fall on the floor with full body jerking, blood and spit coming out of her mouth, with urinary incontinence. Seizure lasted a few minutes and she was sleepy on EMS  arrival. She was admitted to [facility] where she had a normal CBC, BMP showed a potassium of 2.6, glucose 126, chloride 97, elevated lactic acid 3.68. UDS and EtOH level negative. I personally reviewed head CT which did not show any acute changes, there was mild diffuse atrophy. She was discharged home on Keppra 500mg  BID.  The patient and her son report that she has been having episodes of staring and unresponsiveness for over a year, she would have hand automatisms ("playing with her fingers") and possibly lip smacking and ?head turn to the right. Her son has only witnessed one episode, she lives with her daughter who sees them more, occurring around twice a week or more. They last only a couple of minutes. They report that she had some staring episodes the day and evening prior to the convulsion. She reports that these can be preceded by feeling sick in her stomach and "chills." She has noticed gaps in time herself, but when family is calling her after an episode, she would be unaware and ask what was wrong. She denies any olfactory/gustatory hallucinations, deja vu, focal numbness/tingling/weakness, myoclonic jerks. She had seen neurologist Dr. Melrose Nakayama in August 2016 for frequent headaches and staring off. There is an MRI brain without contrast report from 06/30/14 which did not show any acute changes, moderate for age nonspecific signal changes in the cerebral white matter, most commonly due to chronic small vessel disease, multilevel severe cervical spine disc and endplate degeneration with suspected spinal stenosis. MRI C-spine done 07/17/14 reported marked multilevel degenerative disc disease with moderate  central canal stenosis at levels C3-4, C4-5, C6-7 and severe right neural foraminal narrowing at C4-5. Her routine wake and sleep EEG done 11/25/14 was normal. She was started on nortriptyline for the frequent headaches but has not been taking this regularly. [name]   Epilepsy Risk Factors: She reports having a fall  in 2010 with "bleeding in the brain." No neurosurgical procedures done. Otherwise she had a normal birth and early development. There is no history of febrile convulsions, CNS infections such as meningitis/encephalitis, or family history of seizures.  PAST MEDICAL HISTORY: Past Medical History:  Diagnosis Date  . Arthritis   . Cancer [facility]) 2008   anal  . Hypertension   . IBS (irritable bowel syndrome)   . IBS (irritable bowel syndrome)   . Leg fracture, left   . Recurrent headache   . Sarcoidosis of lung ([place])   . Seizures ([place])     MEDICATIONS: Current Outpatient Prescriptions on File Prior to Visit  Medication Sig Dispense Refill  . amLODipine (NORVASC) 10 MG tablet Take 10 mg by mouth daily.    [name] aspirin 81 MG tablet Take 81 mg by mouth daily.    . Cholecalciferol (VITAMIN D3) 50000 units TABS Take 50,000 Units by mouth every Monday.     . cyclobenzaprine (FLEXERIL) 5 MG tablet Take 1 tablet as needed every 12 hours for neck and back pain. 30 tablet 5  . docusate sodium (COLACE) 100 MG capsule Take 1 capsule (100 mg total) by mouth 2 (two) times daily. 60 capsule 3  . HYDROcodone-acetaminophen (NORCO/VICODIN) 5-325 MG tablet Take 1-2 tablets by mouth every 4 (four) hours as needed (breakthrough pain). 80 tablet 0  . levETIRAcetam (KEPPRA XR) 500 MG 24 hr tablet TAKE FOUR TABLETS BY MOUTH IN THE EVENING 120 tablet 6  . losartan (COZAAR) 100 MG tablet Take 1 tablet (100 mg total) by mouth daily. 30 tablet 0  . losartan (COZAAR) 100 MG tablet TAKE ONE TABLET BY MOUTH ONCE DAILY 30 tablet 5  . senna (SENOKOT) 8.6 MG TABS tablet Take 2 tablets (17.2 mg total) by mouth at bedtime. 60 each 3   No current facility-administered medications on file prior to visit.     ALLERGIES: Allergies  Allergen Reactions  . No Known Allergies     FAMILY HISTORY: Family History  Problem Relation Age of Onset  . Dementia Mother   . High blood pressure Father   . Dementia Maternal Aunt   .  Sarcoidosis Son   . Seizures Neg Hx     SOCIAL HISTORY: Social History   Social History  . Marital status: Single    Spouse name: N/A  . Number of children: 3  . Years of education: N/A   Occupational History  . Retired    Social History Main Topics  . Smoking status: Never Smoker  . Smokeless tobacco: Never Used  . Alcohol use No  . Drug use: No  . Sexual activity: Not on file   Other Topics Concern  . Not on file   Social History Narrative  . No narrative on file    REVIEW OF SYSTEMS: Constitutional: No fevers, chills, or sweats, no generalized fatigue, change in appetite Eyes: No visual changes, double vision, eye pain Ear, nose and throat: No hearing loss, ear pain, nasal congestion, sore throat Cardiovascular: No chest pain, palpitations Respiratory:  No shortness of breath at rest or with exertion, wheezes GastrointestinaI: No nausea, vomiting, diarrhea, abdominal pain, fecal incontinence Genitourinary:  no  dysuria, no urinary retention or frequency Musculoskeletal:  No neck pain, back pain, +bilateral hip pain Integumentary: No rash, pruritus, skin lesions Neurological: as above Psychiatric: No depression, insomnia, anxiety Endocrine: No palpitations, fatigue, diaphoresis, mood swings, change in appetite, change in weight, increased thirst Hematologic/Lymphatic:  No anemia, purpura, petechiae. Allergic/Immunologic: no itchy/runny eyes, nasal congestion, recent allergic reactions, rashes  PHYSICAL EXAM: Vitals:   04/28/16 1018  BP: (!) 156/82  Pulse: 72   General: No acute distress, sitting in wheelchair Head:  Normocephalic/atraumatic Neck: supple, no paraspinal tenderness, full range of motion Heart:  Regular rate and rhythm Lungs:  Clear to auscultation bilaterally Back: No paraspinal tenderness Skin/Extremities: No rash, no edema Neurological Exam: alert and oriented to person, place, and time. No aphasia or dysarthria. Fund of knowledge is  appropriate.  Recent and remote memory are intact.  Attention and concentration are normal.    Able to name objects and repeat phrases. Cranial nerves: Pupils equal, round, reactive to light. Extraocular movements intact with no nystagmus. Visual fields full. Facial sensation intact. No facial asymmetry. Tongue, uvula, palate midline.  Motor: Bulk and tone normal, muscle strength 5/5 throughout with no pronator drift.  Sensation to light touch intact.  No extinction to double simultaneous stimulation.  Deep tendon reflexes 2+ throughout, toes downgoing.  Finger to nose testing intact.  Gait not tested, in pain due to hip pain.  IMPRESSION: This is a pleasant 66 yo RH woman with a history of hypertension, pulmonary sarcoidosis, with new onset generalized tonic-clonic seizure on 04/25/2015. 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 MRI brain without contrast done in April 2016 was reported as unremarkable, routine and 24-hour EEG normal. She reports 2 back to back episodes of focal seizures with impaired awareness last 04/25/16 on Keppra XR 1500mg  qhs. She had side effects on the higher dose. MRI brain with and without contrast will be ordered to further classify her seizures. We discussed addition of a second AED, she will start zonisamide with uptitration every 2 weeks. Side effects were discussed. She reduced Keppra to 1500mg  qhs due to sleepiness. She does not drive and is aware of [place] driving laws to stop driving after a seizure/episode of loss of consciousness, until 6 months seizure-free. She will follow-up in 3 months and knows to call for any changes.   Thank you for allowing me to participate in her care.  Please do not hesitate to call for any questions or concerns.  The duration of this appointment visit was 25 minutes of face-to-face time with the patient.  Greater than 50% of this time was spent in  counseling, explanation of diagnosis, planning of further management, and coordination of care.   Ellouise Newer, M.D.   CC: Threasa Alpha, FNP

## 2016-04-28 NOTE — Patient Instructions (Addendum)
1. Schedule MRI brain with and without contrast 2. Start Zonegran 100mg : Take 1 capsule at night for 2 weeks, then increase to 2 capsules at night for 2 weeks, then increase to 3 capsules at night and continue 3. Continue Keppra XR 500mg : Take 3 tablets at night 4. No driving until 6 months seizure-free 5. Follow-up in 3 months  Seizure Precautions: 1. If medication has been prescribed for you to prevent seizures, take it exactly as directed.  Do not stop taking the medicine without talking to your doctor first, even if you have not had a seizure in a long time.   2. Avoid activities in which a seizure would cause danger to yourself or to others.  Don't operate dangerous machinery, swim alone, or climb in high or dangerous places, such as on ladders, roofs, or girders.  Do not drive unless your doctor says you may.  3. If you have any warning that you may have a seizure, lay down in a safe place where you can't hurt yourself.    4.  No driving for 6 months from last seizure, as per [facility].   Please refer to the following link on the [place] website for more information: http://[email or web address]/[email or web address]/driving/drivingu.cfm   5.  Maintain good sleep hygiene. Avoid alcohol.  6.  Contact your doctor if you have any problems that may be related to the medicine you are taking.  7.  Call 911 and bring the patient back to the ED if:        A.  The seizure lasts longer than 5 minutes.       B.  The patient doesn't awaken shortly after the seizure  C.  The patient has new problems such as difficulty seeing, speaking or moving  D.  The patient was injured during the seizure  E.  The patient has a temperature over 102 F (39C)  F.  The patient vomited and now is having trouble breathing

## 2016-05-08 ENCOUNTER — Encounter: Payer: Self-pay | Admitting: Neurology

## 2016-05-11 ENCOUNTER — Ambulatory Visit: Payer: Medicare Other | Admitting: Neurology

## 2016-05-13 ENCOUNTER — Ambulatory Visit
Admission: RE | Admit: 2016-05-13 | Discharge: 2016-05-13 | Disposition: A | Discharge status: Home / Self Care | Payer: Medicare Other | Source: Ambulatory Visit | Attending: Neurology | Admitting: Neurology

## 2016-05-13 DIAGNOSIS — G40009 Localization-related (focal) (partial) idiopathic epilepsy and epileptic syndromes with seizures of localized onset, not intractable, without status epilepticus: Secondary | ICD-10-CM

## 2016-05-13 MED ORDER — GADOBENATE DIMEGLUMINE 529 MG/ML IV SOLN
12.0000 mL | Freq: Once | INTRAVENOUS | Status: AC | PRN
  Administered 2016-05-13: 12 mL via INTRAVENOUS

## 2016-05-16 ENCOUNTER — Telehealth: Payer: Self-pay | Admitting: Neurology

## 2016-05-16 NOTE — Telephone Encounter (Signed)
Pls let her know I reviewed MRI brain, no evidence of tumor, stroke, or bleed. Looks overall good. Thanks

## 2016-05-16 NOTE — Telephone Encounter (Signed)
Did she start the new medication zonisamide yet? She should be on 2 caps at night, then increase to 3 caps as scheduled. Let's see how seizures are once she is on a good dose of the new medication. Thanks

## 2016-05-16 NOTE — Telephone Encounter (Signed)
PT wanted to know her MRI results/Dawn CB# [id number]

## 2016-05-16 NOTE — Telephone Encounter (Signed)
Patient notified. She states she wants you aware she did have another episode this morning and that's why her daughter called for results.

## 2016-05-16 NOTE — Telephone Encounter (Signed)
Please advise 

## 2016-05-16 NOTE — Telephone Encounter (Signed)
She states she did start medication so she wanted to see how this does and we will see how things go with this.

## 2016-05-18 NOTE — Progress Notes (Signed)
LMTCB

## 2016-06-06 ENCOUNTER — Telehealth: Payer: Self-pay | Admitting: Pulmonary Disease

## 2016-06-06 NOTE — Telephone Encounter (Signed)
Notes Recorded by Rigoberto Noel, MD on 05/16/2016 at 11:03 PM EST She needs FU CT chest no contrast & OV --------- Per documentation pt needs to be scheduled for ct and rov post.    lmtcb X1 for pt.

## 2016-06-07 NOTE — Telephone Encounter (Signed)
lmtcb for pt.  

## 2016-06-09 ENCOUNTER — Telehealth: Payer: Self-pay | Admitting: Neurology

## 2016-06-09 NOTE — Telephone Encounter (Signed)
Called and spoke with pt and she stated that she has been having tons of doctors appts and tests since she has been having these seizures.  She stated that she will look at her schedule and give [place] a call back to schedule this appt with RA and CT.  She is aware to schedule in May.

## 2016-06-09 NOTE — Telephone Encounter (Signed)
Patient needs to talk to someone about medication she does not like the way she feels on it please call [phone]

## 2016-06-12 NOTE — Telephone Encounter (Signed)
Clld pt - went over directions again with pt on taking Zonegran and advised she needs to eat a full meal when taking medication.   Pt stated she didn't have that much of an appetite which could be causing some nausea and loss of appetite as well. Pt stated she would try full and better meals instead of sandwiches when taking medications.

## 2016-07-03 ENCOUNTER — Telehealth: Payer: Self-pay | Admitting: Neurology

## 2016-07-03 NOTE — Telephone Encounter (Signed)
Caller: PT's daughter  Urgent? No  Reason for the call: PT had a seizure over the weekend and was hospitalized in [place], should she be seen by Dr Briant Sites

## 2016-07-04 NOTE — Telephone Encounter (Signed)
Pt's daughter cll back stating [facility] only increased the pt's Keppra from 500 mg to 1000 mg due to her seizure.   Advsd I would forward the message to the provider.

## 2016-07-04 NOTE — Telephone Encounter (Signed)
I thought she was on 1500mg  Keppra? Is she also taking the Zonisamide 300mg  qhs? Pls confirm medications, we may adjust and keep follow-up in May. Thanks

## 2016-07-04 NOTE — Telephone Encounter (Signed)
Ret pt's daughter cll - Griffin Dakin advsd that the pt had a seizure and hospitlaized over the weekend while in [place] with the other daughter an

## 2016-07-05 NOTE — Telephone Encounter (Signed)
Stay on the 1000mg  Keppra for now and continue Zonisamide 300mg  qhs, call for any changes before her appt in May, keep May appt. Thanks

## 2016-07-05 NOTE — Telephone Encounter (Signed)
Clld pt's daughter - Alicia Brennan that her mother was not taking 1500 mg Keppra, just 500 mg. Presently, she's taking 1000 mg Keppra per the directions from the hospital. Alicia Brennan mom is taking Zonisamide 300 mg qhs.  I advsd Alicia Brennan that I would be forwarding this message to Dr. Delice Lesch for further instructions. I also advsd her to make sure her mom keeps her May appt.  as well - she stated she definitely will.

## 2016-07-05 NOTE — Telephone Encounter (Signed)
Clld pt's daughter, Griffin Dakin - LMOVM re provider's notations.

## 2016-07-31 ENCOUNTER — Encounter: Payer: Self-pay | Admitting: Neurology

## 2016-07-31 ENCOUNTER — Ambulatory Visit (INDEPENDENT_AMBULATORY_CARE_PROVIDER_SITE_OTHER): Payer: Medicare Other | Admitting: Neurology

## 2016-07-31 DIAGNOSIS — G40009 Localization-related (focal) (partial) idiopathic epilepsy and epileptic syndromes with seizures of localized onset, not intractable, without status epilepticus: Secondary | ICD-10-CM | POA: Diagnosis not present

## 2016-07-31 MED ORDER — ZONISAMIDE 100 MG PO CAPS: ORAL_CAPSULE | ORAL | 11 refills | Status: DC

## 2016-07-31 MED ORDER — LACOSAMIDE 100 MG PO TABS: ORAL_TABLET | ORAL | 5 refills | Status: DC

## 2016-07-31 MED ORDER — LEVETIRACETAM 1000 MG PO TABS
ORAL_TABLET | ORAL | 0 refills | Status: DC
Start: 2016-07-31 — End: 2016-09-01

## 2016-07-31 NOTE — Progress Notes (Signed)
NEUROLOGY FOLLOW UP OFFICE NOTE  Alicia Brennan [id number]  HISTORY OF PRESENT ILLNESS: I had the pleasure of seeing Alicia Brennan in follow-up in the neurology clinic on 07/31/2016.She is accompanied by her son who helps supplement the history today. The patient was last seen 3 months ago after new onset convulsion on 04/25/15. On further questioning, she had been having episodes of staring and unresponsiveness with hand automatisms and lip smacking for the past year. MRI brain, routine and 24-hour EEG unremarkable. She continued to report recurrent episodes of loss of awareness with falls on Keppra. Zonisamide was added on her last visit. She called our office while visiting her daughter in [place], reporting she had a seizure on 06/29/16. Records from [facility] were reviewed, she apparently had 3 seizures that day, 2 witnessed by EMS. She was discharged home on Keppra 1000mg  BID. Her son reports that she was talking then stopped, started counting backwards and was messing with her hands with eyes fluttering. This lasted 5 minutes, she was confused on EMS arrival, and was out of it for a day. Her son feels she is still not back to baseline cognitively since the seizure. She reports her memory is "gone." She feels the Keppra 1000mg  BID is making her "feel funny" and sleep a lot. She feels more unbalanced and reports falling while admitted in [place]. She denies any olfactory/gustatory hallucinations, myoclonic jerks, focal numbness/tingling/weakness. She has some blurred vision.   I personally reviewed MRI brain with and without contrast done 05/13/16 which did not show any acute changes, hippocampi symmetric with no abnormal signal or enhancement, there was mild to moderate chronic microvascular disease.  HPI: This is a pleasant 66 yo RH woman with a history of hypertension, pulmonary sarcoidosis, anal cancer, with new onset convulsion on 04/25/2015. She has no recollection of events and  had a seizure in her sleep, her daughter woke up at 4am hearing her fall on the floor with full body jerking, blood and spit coming out of her mouth, with urinary incontinence. Seizure lasted a few minutes and she was sleepy on EMS arrival. She was admitted to [facility] where she had a normal CBC, BMP showed a potassium of 2.6, glucose 126, chloride 97, elevated lactic acid 3.68. UDS and EtOH level negative. I personally reviewed head CT which did not show any acute changes, there was mild diffuse atrophy. She was discharged home on Keppra 500mg  BID.  The patient and her son report that she has been having episodes of staring and unresponsiveness for over a year, she would have hand automatisms ("playing with her fingers") and possibly lip smacking and ?head turn to the right. Her son has only witnessed one episode, she lives with her daughter who sees them more, occurring around twice a week or more. They last only a couple of minutes. They report that she had some staring episodes the day and evening prior to the convulsion. She reports that these can be preceded by feeling sick in her stomach and "chills." She has noticed gaps in time herself, but when family is calling her after an episode, she would be unaware and ask what was wrong. She denies any olfactory/gustatory hallucinations, deja vu, focal numbness/tingling/weakness, myoclonic jerks. She had seen neurologist Dr. Melrose Nakayama in August 2016 for frequent headaches and staring off. There is an MRI brain without contrast report from 06/30/14 which did not show any acute changes, moderate for age nonspecific signal changes in the cerebral white matter, most commonly due  to chronic small vessel disease, multilevel severe cervical spine disc and endplate degeneration with suspected spinal stenosis. MRI C-spine done 07/17/14 reported marked multilevel degenerative disc disease with moderate central canal stenosis at levels C3-4, C4-5, C6-7 and severe right neural foraminal  narrowing at C4-5. Her routine wake and sleep EEG done 11/25/14 was normal. She was started on nortriptyline for the frequent headaches but has not been taking this regularly. [name]   Epilepsy Risk Factors: She reports having a fall in 2010 with "bleeding in the brain." No neurosurgical procedures done. Otherwise she had a normal birth and early development. There is no history of febrile convulsions, CNS infections such as meningitis/encephalitis, or family history of seizures.  PAST MEDICAL HISTORY: Past Medical History:  Diagnosis Date  . Arthritis   . Cancer [facility]) 2008   anal  . Hypertension   . IBS (irritable bowel syndrome)   . IBS (irritable bowel syndrome)   . Leg fracture, left   . Recurrent headache   . Sarcoidosis of lung ([facility])   . Seizures ([place])     MEDICATIONS: Current Outpatient Prescriptions on File Prior to Visit  Medication Sig Dispense Refill  . amLODipine (NORVASC) 10 MG tablet Take 10 mg by mouth daily.    [name] aspirin 81 MG tablet Take 81 mg by mouth daily.    . Cholecalciferol (VITAMIN D3) 50000 units TABS Take 50,000 Units by mouth every Monday.     . cyclobenzaprine (FLEXERIL) 5 MG tablet Take 1 tablet as needed every 12 hours for neck and back pain. 30 tablet 5  . docusate sodium (COLACE) 100 MG capsule Take 1 capsule (100 mg total) by mouth 2 (two) times daily. 60 capsule 3  . HYDROcodone-acetaminophen (NORCO/VICODIN) 5-325 MG tablet Take 1-2 tablets by mouth every 4 (four) hours as needed (breakthrough pain). (Patient not taking: Reported on 04/28/2016) 80 tablet 0  . levETIRAcetam (KEPPRA XR) 500 MG 24 hr tablet TAKE THREE TABLETS BY MOUTH IN THE EVENING 90 tablet 6  . losartan (COZAAR) 100 MG tablet Take 1 tablet (100 mg total) by mouth daily. 30 tablet 0  . losartan (COZAAR) 100 MG tablet TAKE ONE TABLET BY MOUTH ONCE DAILY 30 tablet 5  . senna (SENOKOT) 8.6 MG TABS tablet Take 2 tablets (17.2 mg total) by mouth at bedtime. 60 each 3  . zonisamide (ZONEGRAN) 100  MG capsule Take 1 capsule at night for 2 weeks, then increase to 2 capsules at night for 2 weeks, then increase to 3 capsules at night and continue 90 capsule 5   No current facility-administered medications on file prior to visit.     ALLERGIES: Allergies  Allergen Reactions  . No Known Allergies     FAMILY HISTORY: Family History  Problem Relation Age of Onset  . Dementia Mother   . High blood pressure Father   . Dementia Maternal Aunt   . Sarcoidosis Son   . Seizures Neg Hx     SOCIAL HISTORY: Social History   Social History  . Marital status: Single    Spouse name: N/A  . Number of children: 3  . Years of education: N/A   Occupational History  . Retired    Social History Main Topics  . Smoking status: Never Smoker  . Smokeless tobacco: Never Used  . Alcohol use No  . Drug use: No  . Sexual activity: Not on file   Other Topics Concern  . Not on file   Social History Narrative  .  No narrative on file    REVIEW OF SYSTEMS: Constitutional: No fevers, chills, or sweats, no generalized fatigue, change in appetite Eyes: No visual changes, double vision, eye pain Ear, nose and throat: No hearing loss, ear pain, nasal congestion, sore throat Cardiovascular: No chest pain, palpitations Respiratory:  No shortness of breath at rest or with exertion, wheezes GastrointestinaI: No nausea, vomiting, diarrhea, abdominal pain, fecal incontinence Genitourinary:  no dysuria, no urinary retention or frequency Musculoskeletal:  No neck pain, back pain, +bilateral hip pain Integumentary: No rash, pruritus, skin lesions Neurological: as above Psychiatric: No depression, insomnia, anxiety Endocrine: No palpitations, fatigue, diaphoresis, mood swings, change in appetite, change in weight, increased thirst Hematologic/Lymphatic:  No anemia, purpura, petechiae. Allergic/Immunologic: no itchy/runny eyes, nasal congestion, recent allergic reactions, rashes  PHYSICAL  EXAM: Vitals:   07/31/16 1430  BP: 132/76  Pulse: 68  Temp: 97.7 F (36.5 C)   General: No acute distress Head:  Normocephalic/atraumatic Neck: supple, no paraspinal tenderness, full range of motion Heart:  Regular rate and rhythm Lungs:  Clear to auscultation bilaterally Back: No paraspinal tenderness Skin/Extremities: No rash, no edema Neurological Exam: alert and oriented to person, place, and time. No aphasia or dysarthria. Fund of knowledge is appropriate.  Recent and remote memory are intact. 2/3 delayed recall. Attention and concentration are normal.    Able to name objects but said "butter" instead of "button," able to repeat phrases. Cranial nerves: Pupils equal, round, reactive to light. Extraocular movements intact with no nystagmus. Visual fields full. Facial sensation intact. No facial asymmetry. Tongue, uvula, palate midline.  Motor: Bulk and tone normal, muscle strength 5/5 throughout with no pronator drift.  Sensation to light touch intact.  No extinction to double simultaneous stimulation.  Deep tendon reflexes 2+ throughout, toes downgoing.  Finger to nose testing intact.  Gait small cautious steps, seems to prefer right hip but denies any hip issues.  IMPRESSION: This is a pleasant 66 yo RH woman with a history of hypertension, pulmonary sarcoidosis, who had a convulsion in January 2017. 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 MRI brain with and without contrast unremarkable, routine and 24-hour EEG normal. She continues to have focal seizures with impaired awareness, most recently while in [place] where she apparently had 3 seizures in one day. Keppra dose increased to 1000mg  BID but she is having drowsiness and cognitive changes. She will add on Vimpat 50mg  BID x 1 week, then increase to 100mg  BID. After 2 weeks, she will start tapering off Keppra. Continue Zonisamide 300mg   qhs. She does not drive and is aware of [place] driving laws to stop driving after a seizure/episode of loss of consciousness, until 6 months seizure-free. She will follow-up in 3 months and knows to call for any changes.   Thank you for allowing me to participate in her care.  Please do not hesitate to call for any questions or concerns.  The duration of this appointment visit was 25 minutes of face-to-face time with the patient.  Greater than 50% of this time was spent in counseling, explanation of diagnosis, planning of further management, and coordination of care.   Ellouise Newer, M.D.   CC: Threasa Alpha, FNP

## 2016-07-31 NOTE — Patient Instructions (Signed)
1. Start Vimpat 50mg : Take 1 tablet twice a day for 1 week, then increase to 100mg  twice a day 2. Continue Zonisamide 300mg  at night  3. Continue Levetiracetam 1000mg  twice a day for another 2 weeks, then decrease to 1/2 tablet in AM, 1 tablet at night for a week, then decrease to 1/2 tablet twice a day for a week, then decrease to 1/2 tablet at night for a week, then stop 4. Follow-up in 3 months, call for any changes  Seizure Precautions: 1. If medication has been prescribed for you to prevent seizures, take it exactly as directed.  Do not stop taking the medicine without talking to your doctor first, even if you have not had a seizure in a long time.   2. Avoid activities in which a seizure would cause danger to yourself or to others.  Don't operate dangerous machinery, swim alone, or climb in high or dangerous places, such as on ladders, roofs, or girders.  Do not drive unless your doctor says you may.  3. If you have any warning that you may have a seizure, lay down in a safe place where you can't hurt yourself.    4.  No driving for 6 months from last seizure, as per [facility].   Please refer to the following link on the [place] website for more information: http://www.epilepsyfoundation.org/answerplace/Social/driving/drivingu.cfm   5.  Maintain good sleep hygiene. Avoid alcohol.  6.  Contact your doctor if you have any problems that may be related to the medicine you are taking.  7.  Call 911 and bring the patient back to the ED if:        A.  The seizure lasts longer than 5 minutes.       B.  The patient doesn't awaken shortly after the seizure  C.  The patient has new problems such as difficulty seeing, speaking or moving  D.  The patient was injured during the seizure  E.  The patient has a temperature over 102 F (39C)  F.  The patient vomited and now is having trouble breathing

## 2016-08-01 ENCOUNTER — Telehealth: Payer: Self-pay | Admitting: Neurology

## 2016-08-01 NOTE — Telephone Encounter (Signed)
PT called and said she does no have the money to pick up her prescription and wanted to know if she needs it right away

## 2016-08-03 ENCOUNTER — Telehealth: Payer: Self-pay | Admitting: Neurology

## 2016-08-03 NOTE — Telephone Encounter (Signed)
Received a call from [facility] regarding medication:Levetiracetam .  Patient needs a refill of medication. Yes (Not Sure if she needs a whole bottle or half. She said she will be starting a new medication?   Patient having side effects from medication. No  Patient calling to update [place] on medication. Yes Please call. Thanks

## 2016-08-03 NOTE — Telephone Encounter (Signed)
Returned pt call.  Her son had already dropped her Rx off that she received during her 07/31/16 office visit.  Pt seemed confused about what medications she is taking and there seems to be a lack of communication between her and her son in this regard.

## 2016-08-10 ENCOUNTER — Telehealth: Payer: Self-pay | Admitting: Neurology

## 2016-08-10 NOTE — Telephone Encounter (Signed)
Returned pt call.  She did not necessarily have a question about her medications as clarification.  Explained how she is to take her Keppra and Vimpat during this time, increasing the Vimpat and decreasing the Keppra.  She asked if Vimpat can cause a rash.  I stated that that could be a side effect, but she stated that it looked like shingles.  I encouraged her to seek her PCP in regards to the rash.  She agreed.  There still seems to be some miscommunication between her and her son about her medications.

## 2016-08-10 NOTE — Telephone Encounter (Signed)
Caller: PT  Urgent? No  Reason for the call: PT called and said she has a question regarding her medication Keppra

## 2016-08-16 ENCOUNTER — Telehealth: Payer: Self-pay | Admitting: Neurology

## 2016-08-16 NOTE — Telephone Encounter (Signed)
LMOM with son, Ollen Gross, to return my call in regards to pt medications.

## 2016-08-16 NOTE — Telephone Encounter (Signed)
Son called back to let you know that he is sorry that his mom has called several times. He said he got her back on track with her medication. If you need to call him you can. Thanks

## 2016-08-16 NOTE — Telephone Encounter (Signed)
Returned pt call.  Pt confused on how she is to take her Keppra in combination with Vimpat.  Explained that she was to take her usual does of Keppra for the duration of the 2 week starter pack of Vimpat and start weaning from the [place] starting week 3 of Vimpat.  Pt just realized today that Dr. Delice Lesch has written "week 3" and "week 4" on sample packs given in the office and thought that she was suppose to skip the starter pack all together and was worried because she has "been done" with the starter pack.  Pt seemed more confused by the end of our conversation so I will call her son for clarification.

## 2016-08-16 NOTE — Telephone Encounter (Signed)
PT called in regards to her new medication she is on and has some questions

## 2016-08-24 ENCOUNTER — Telehealth: Payer: Self-pay | Admitting: Neurology

## 2016-08-24 MED ORDER — LACOSAMIDE 100 MG PO TABS: 100.0000 mg | ORAL_TABLET | Freq: Two times a day (BID) | ORAL | 3 refills | Status: DC

## 2016-08-24 NOTE — Telephone Encounter (Signed)
Caller: Pamala Hurry  Urgent? Yes  Reason for the call: Patient will be leaving for [facility] and will only be back in August for her appointment. She was wanting to know could she get some Vimpat Samples to pick up before she leaves? Please call. Thanks

## 2016-08-24 NOTE — Addendum Note (Signed)
Addended by: Lenny Pastel on: 08/24/2016 09:21 AM   Modules accepted: Orders

## 2016-08-24 NOTE — Telephone Encounter (Signed)
Son returned my call.  He is saying that the Rx was never sent to the pharmacy.  Pt is leaving this afternoon for [place] and as of right now does not plan on returning, other than for her appointment in August with Dr. Delice Lesch.  She wishes to stay in [place] with her daughter, and family is trying to "figure out what is going on".  Let him know that I will write and print an Rx for pt to take and have filled in [place].  I will also leave 2 weeks worth of samples at the front desk.

## 2016-08-24 NOTE — Telephone Encounter (Signed)
error 

## 2016-08-24 NOTE — Telephone Encounter (Signed)
Pt son came in and picked up samples, Rx and savings card

## 2016-08-24 NOTE — Telephone Encounter (Signed)
LMOM for pt son, Alicia Brennan, for clarification.  It appears that they were able to get this through their pharmacy.  Will update when he calls back

## 2016-08-31 ENCOUNTER — Telehealth: Payer: Self-pay | Admitting: Neurology

## 2016-08-31 NOTE — Telephone Encounter (Signed)
Patient needs to talk to someone about the medication vimpat it is 800.00 and she cant afford it

## 2016-08-31 NOTE — Telephone Encounter (Signed)
Returned pt call.  She states that she is now in [place] and that her Vimpat is coming to almost $800.  She will call the Rx savings card I gave her son, Ollen Gross.  If that doesn't help I will see what I can do in regards to getting information from the rep.

## 2016-09-01 ENCOUNTER — Other Ambulatory Visit: Payer: Self-pay

## 2016-09-01 DIAGNOSIS — G40009 Localization-related (focal) (partial) idiopathic epilepsy and epileptic syndromes with seizures of localized onset, not intractable, without status epilepticus: Secondary | ICD-10-CM

## 2016-09-01 MED ORDER — LEVETIRACETAM 1000 MG PO TABS: ORAL_TABLET | ORAL | 0 refills | Status: DC

## 2016-09-05 ENCOUNTER — Telehealth: Payer: Self-pay | Admitting: Neurology

## 2016-09-05 NOTE — Telephone Encounter (Signed)
Returned pt call.  Let her know that I have send an Rx for Keppra 1000mg  to her listed preferred pharmacy.  Sig; take 1 tablet BID.

## 2016-09-05 NOTE — Telephone Encounter (Signed)
Please call patient on her cell.

## 2016-09-11 ENCOUNTER — Telehealth: Payer: Self-pay | Admitting: Neurology

## 2016-09-11 NOTE — Telephone Encounter (Signed)
Caller: Pamala Hurry    Urgent? No  Reason for the call: She had a question regarding her new Seizure medication that she went back on. Also, she went back on an old medication and wanted to update Dr. Delice Lesch on it. Please call. Thanks

## 2016-09-12 NOTE — Telephone Encounter (Signed)
Spoke with pt.  She states that she felt she was doing better on Vimpat vs Keppra.  Says that she is experiencing lightheadedness, is falling asleep faster now and is "sluggish" in general.  She has not looked into any doctors in her current area, [place].  States that she is still taking her Zonegran every night and that her daughter is wondering if the 2 medications could be interacting with one another to make her feel this way.  She stated that she feels that applying for assistance through Vimpat[employer]com would not benefit her as she has Medicare and the assistance program is more for commercial insurance.  Says she is doing a lot of sleeping and resting in [place], which is the main reason she went.  Unsure if she will return to [facility] or stay in [place].  States she will do her best to make it to her scheduled appointment in August.

## 2016-10-03 ENCOUNTER — Telehealth: Payer: Self-pay | Admitting: Neurology

## 2016-10-03 NOTE — Telephone Encounter (Signed)
Patient wants to talk to someone about medication

## 2016-10-04 NOTE — Telephone Encounter (Signed)
Returned pt call.  No answer.  Voicemail box full, unable to leave message

## 2016-10-05 ENCOUNTER — Telehealth: Payer: Self-pay | Admitting: Neurology

## 2016-10-05 ENCOUNTER — Other Ambulatory Visit: Payer: Self-pay

## 2016-10-05 DIAGNOSIS — G40009 Localization-related (focal) (partial) idiopathic epilepsy and epileptic syndromes with seizures of localized onset, not intractable, without status epilepticus: Secondary | ICD-10-CM

## 2016-10-05 MED ORDER — LEVETIRACETAM 1000 MG PO TABS: ORAL_TABLET | ORAL | 12 refills | Status: DC

## 2016-10-05 NOTE — Telephone Encounter (Signed)
Caller:Alicia Brennan  Urgent? No  Reason for the call: She will be needing a refill on her Seizure medication. She will need it sent to [facility]. Please call. Thanks

## 2016-10-05 NOTE — Telephone Encounter (Signed)
Rx for Keppra sent to pt's listed preferred pharmacy ([facility] in [place], [place])

## 2016-10-05 NOTE — Telephone Encounter (Signed)
Rx for Keppra sent to pt's listed preferred pharmacy ([facility] in [place], [place])

## 2016-10-05 NOTE — Telephone Encounter (Signed)
Spoke with pt, letting her know that I Have sent the Rx to her pharmacy.  Pt states that she has been tired more lately but is happy to be visiting with her mother in [place].

## 2016-10-05 NOTE — Telephone Encounter (Signed)
Returned pt call again.  No answer.  Voicemail box full.  Unable to leave message.

## 2016-10-31 ENCOUNTER — Telehealth: Payer: Self-pay | Admitting: Pulmonary Disease

## 2016-10-31 DIAGNOSIS — D860 Sarcoidosis of lung: Secondary | ICD-10-CM

## 2016-10-31 DIAGNOSIS — I10 Essential (primary) hypertension: Secondary | ICD-10-CM

## 2016-10-31 DIAGNOSIS — R911 Solitary pulmonary nodule: Secondary | ICD-10-CM

## 2016-10-31 NOTE — Telephone Encounter (Signed)
ATC patient but she did not answer. VM not set up. Will call back later.

## 2016-11-01 MED ORDER — LOSARTAN POTASSIUM 100 MG PO TABS: 100.0000 mg | ORAL_TABLET | Freq: Every day | ORAL | 0 refills | Status: DC

## 2016-11-01 NOTE — Telephone Encounter (Signed)
Spoke with pt. She is needing a refill on Losartan. She is currently in [place] and her mother is currently under Hospice care. Advised the pt that I would send in a 30 day supply then all additional refills will have to come from a MD in [place]. She verbalized understanding. Nothing further was needed.

## 2016-11-06 ENCOUNTER — Ambulatory Visit: Payer: Medicare Other | Admitting: Neurology

## 2017-02-08 ENCOUNTER — Telehealth: Payer: Self-pay | Admitting: Neurology

## 2017-02-08 NOTE — Telephone Encounter (Signed)
Pt called and said she wanted a call back from Dr Aquino's nurse but did not say why

## 2017-02-09 NOTE — Telephone Encounter (Signed)
Returned call.  No answer.  LMOM asking pt to return my call

## 2017-08-13 ENCOUNTER — Telehealth: Payer: Self-pay | Admitting: Neurology

## 2017-08-13 ENCOUNTER — Other Ambulatory Visit: Payer: Self-pay | Admitting: Neurology

## 2017-08-13 DIAGNOSIS — G40009 Localization-related (focal) (partial) idiopathic epilepsy and epileptic syndromes with seizures of localized onset, not intractable, without status epilepticus: Secondary | ICD-10-CM

## 2017-08-13 NOTE — Telephone Encounter (Signed)
Patient needs to talk to someone about medication refill they are telling her they came here not to [place] and she was checking on this  For all of her seizure medication

## 2017-08-16 NOTE — Telephone Encounter (Signed)
Returned call. Line busy.

## 2018-01-08 IMAGING — DX DG KNEE COMPLETE 4+V*L*
5 series · 5 of 5 positions shown · non-contrast
Comparison: None.

CLINICAL DATA: Pain and swelling in the entire left knee. Unable to
bear weight. Surgery 10 years ago.

EXAM:
LEFT KNEE - COMPLETE 4+ VIEW

[x knee ap left]
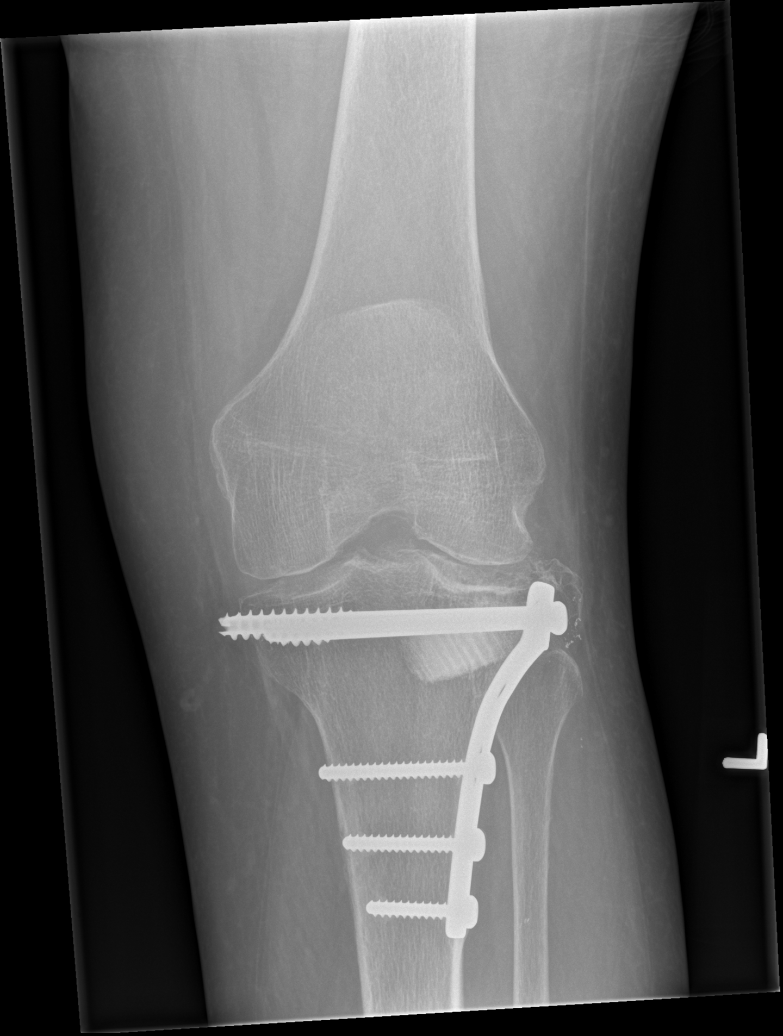

[x knee obl left (1 of 2)]
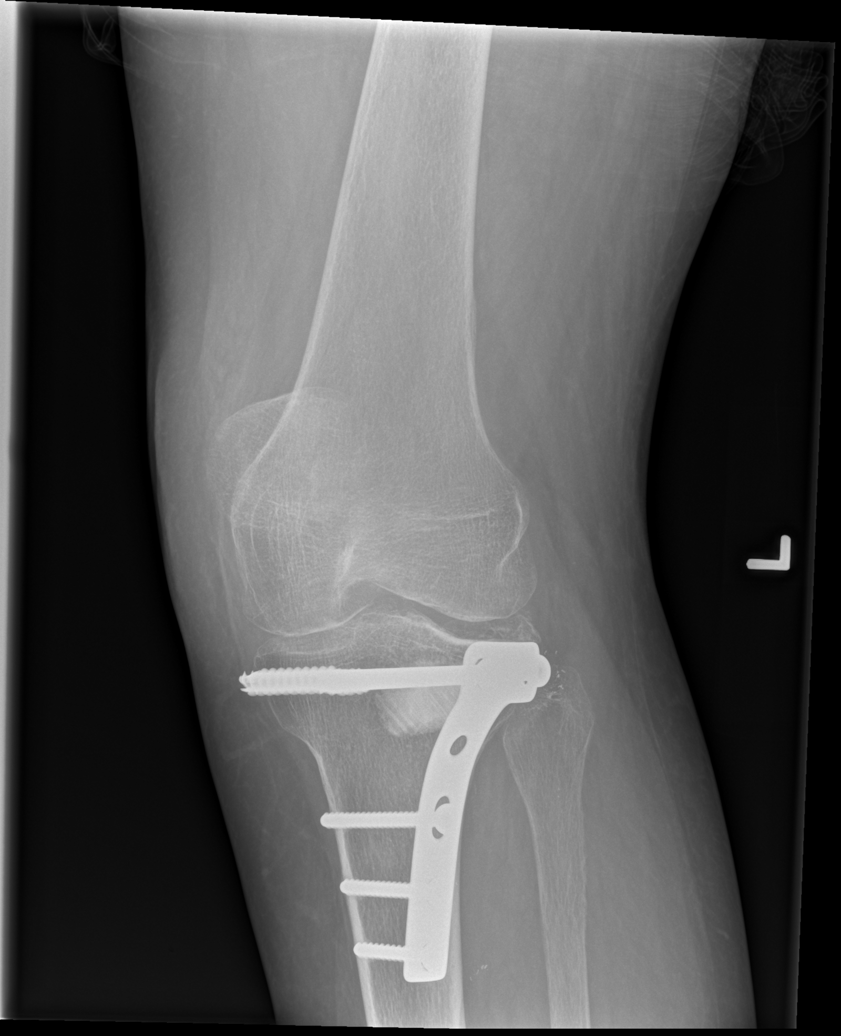

[x knee obl left (2 of 2)]
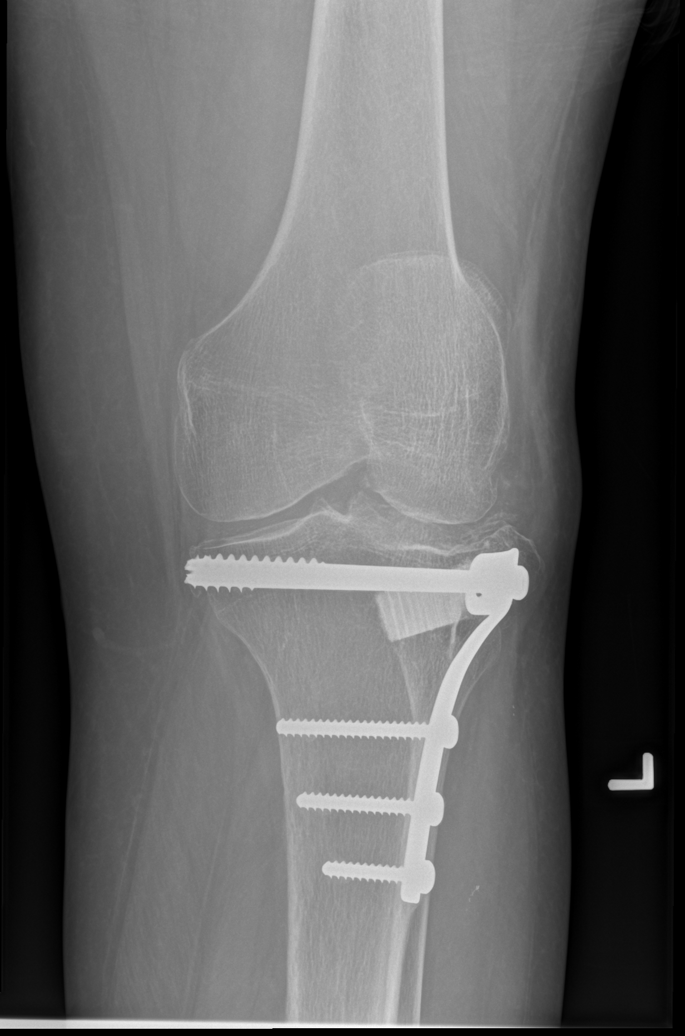

[x knee lat left (1 of 2)]
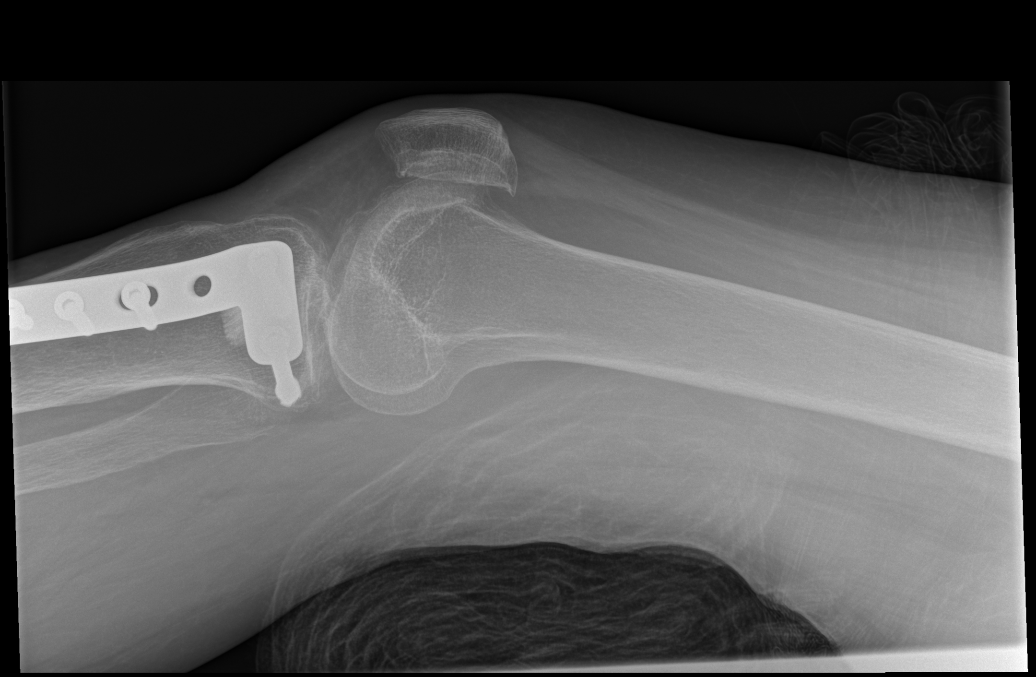

[x knee lat left (2 of 2)]
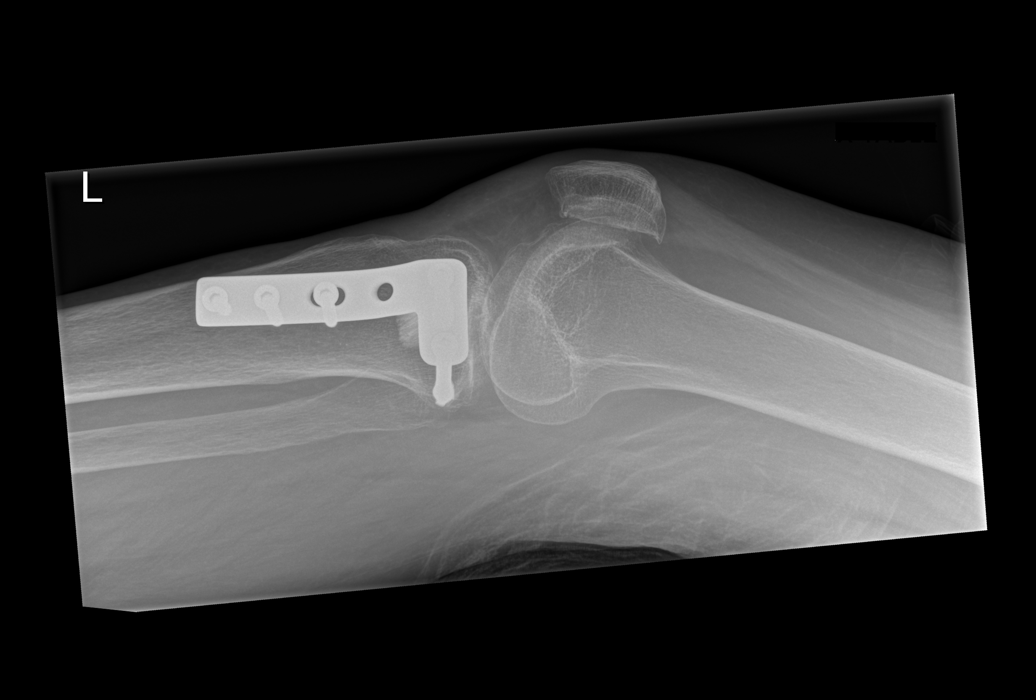

[5 of 5 positions shown; findings below may reference images not displayed]

FINDINGS: Prior surgery to the proximal tibia. There is a lateral plate with
screw fixation involving the tibial plateau and proximal tibia. In
addition, there is a striated dense structure along the lateral
aspect of the proximal tibia which may be related to a bone plug or
graft. The most proximal screw extends through the medial cortex of
the proximal tibia. There is sclerosis and irregularity along the
lateral tibial plateau compatible with an old injury. Difficult to
exclude small loose bodies in this area. There is bony overgrowth
adjacent to the proximal aspect of the surgical hardware. Proximal
fibula appears to be intact. Spurring and degenerative changes at
the patellofemoral compartment. No significant knee joint effusion.
IMPRESSION: Irregularity of the tibial plateau related to old trauma and
surgical fixation.

No acute bone abnormality.

No significant joint effusion.

## 2018-03-25 ENCOUNTER — Emergency Department (HOSPITAL_COMMUNITY)
Admission: EM | Admit: 2018-03-25 | Discharge: 2018-03-25 | Disposition: A | Discharge status: Home / Self Care | Payer: Medicare Other | Attending: Emergency Medicine | Admitting: Emergency Medicine

## 2018-03-25 ENCOUNTER — Encounter (HOSPITAL_COMMUNITY): Payer: Self-pay | Admitting: Emergency Medicine

## 2018-03-25 ENCOUNTER — Other Ambulatory Visit: Payer: Self-pay

## 2018-03-25 DIAGNOSIS — G40909 Epilepsy, unspecified, not intractable, without status epilepticus: Secondary | ICD-10-CM | POA: Insufficient documentation

## 2018-03-25 DIAGNOSIS — I10 Essential (primary) hypertension: Secondary | ICD-10-CM | POA: Diagnosis not present

## 2018-03-25 DIAGNOSIS — G40009 Localization-related (focal) (partial) idiopathic epilepsy and epileptic syndromes with seizures of localized onset, not intractable, without status epilepticus: Secondary | ICD-10-CM

## 2018-03-25 DIAGNOSIS — Z7982 Long term (current) use of aspirin: Secondary | ICD-10-CM | POA: Diagnosis not present

## 2018-03-25 DIAGNOSIS — Z79899 Other long term (current) drug therapy: Secondary | ICD-10-CM | POA: Insufficient documentation

## 2018-03-25 DIAGNOSIS — R569 Unspecified convulsions: Secondary | ICD-10-CM

## 2018-03-25 LAB — CBC WITH DIFFERENTIAL/PLATELET
Abs Immature Granulocytes: 0.01 10*3/uL (ref 0.00–0.07)
Basophils Absolute: 0 10*3/uL (ref 0.0–0.1)
Basophils Relative: 1 %
Eosinophils Absolute: 0.1 10*3/uL (ref 0.0–0.5)
Eosinophils Relative: 2 %
HEMATOCRIT: 40.6 % (ref 36.0–46.0)
Hemoglobin: 12.8 g/dL (ref 12.0–15.0)
Immature Granulocytes: 0 %
Lymphocytes Relative: 29 %
Lymphs Abs: 1.5 10*3/uL (ref 0.7–4.0)
MCH: 29.2 pg (ref 26.0–34.0)
MCHC: 31.5 g/dL (ref 30.0–36.0)
MCV: 92.7 fL (ref 80.0–100.0)
Monocytes Absolute: 0.6 10*3/uL (ref 0.1–1.0)
Monocytes Relative: 11 %
Neutro Abs: 2.9 10*3/uL (ref 1.7–7.7)
Neutrophils Relative %: 57 %
Platelets: 171 10*3/uL (ref 150–400)
RBC: 4.38 MIL/uL (ref 3.87–5.11)
RDW: 13.5 % (ref 11.5–15.5)
WBC: 5.1 10*3/uL (ref 4.0–10.5)
nRBC: 0 % (ref 0.0–0.2)

## 2018-03-25 LAB — BASIC METABOLIC PANEL
Anion gap: 9 (ref 5–15)
BUN: 9 mg/dL (ref 8–23)
CALCIUM: 9.2 mg/dL (ref 8.9–10.3)
CO2: 23 mmol/L (ref 22–32)
Chloride: 109 mmol/L (ref 98–111)
Creatinine, Ser: 1.11 mg/dL — ABNORMAL HIGH (ref 0.44–1.00)
GFR calc Af Amer: 59 mL/min — ABNORMAL LOW (ref >60)
GFR calc non Af Amer: 51 mL/min — ABNORMAL LOW (ref >60)
Glucose, Bld: 109 mg/dL — ABNORMAL HIGH (ref 70–99)
Potassium: 3.5 mmol/L (ref 3.5–5.1)
Sodium: 141 mmol/L (ref 135–145)

## 2018-03-25 LAB — CBG MONITORING, ED: Glucose-Capillary: 97 mg/dL (ref 70–99)

## 2018-03-25 LAB — VALPROIC ACID LEVEL: Valproic Acid Lvl: 36 ug/mL — ABNORMAL LOW (ref 50.0–100.0)

## 2018-03-25 MED ORDER — ZONISAMIDE 100 MG PO CAPS: 300.0000 mg | ORAL_CAPSULE | Freq: Every day | ORAL | Status: DC

## 2018-03-25 MED ORDER — DIVALPROEX SODIUM 250 MG PO DR TAB: 250.0000 mg | DELAYED_RELEASE_TABLET | Freq: Three times a day (TID) | ORAL | 0 refills | Status: AC

## 2018-03-25 MED ORDER — ZONISAMIDE 100 MG PO CAPS
300.0000 mg | ORAL_CAPSULE | Freq: Every day | ORAL | Status: DC
  Administered 2018-03-25: 300 mg via ORAL
  Filled 2018-03-25 (×2): qty 3

## 2018-03-25 MED ORDER — LEVETIRACETAM IN NACL 1000 MG/100ML IV SOLN
1000.0000 mg | Freq: Once | INTRAVENOUS | Status: AC
  Administered 2018-03-25: 1000 mg via INTRAVENOUS
  Filled 2018-03-25: qty 100

## 2018-03-25 MED ORDER — ZONISAMIDE 100 MG PO CAPS: 300.0000 mg | ORAL_CAPSULE | Freq: Every day | ORAL | 0 refills | Status: DC

## 2018-03-25 NOTE — Discharge Instructions (Signed)
STOP taking your Valproic Acid ER tablets and begin taking the prescrioption of valproic Acid DR tablets 250 mg, three times a day.  Follow up closely with your Neurologist.

## 2018-03-25 NOTE — ED Triage Notes (Signed)
Pt BIB [facility] for an absence seizure. EMS reports the family and the pt were walking up the stairs when the pt stopped and just started to stare. EMS reports the pt has a history of same. EMS reports the pt was AOx0 upon their arrival but by the time they loaded the pt into the unit the pt was AOx4. EMS advised 12 lead unremarkable, IV attempted x2 but unsuccessful. EMS vitals stable as noted.   Pt does not remember anything. Pt states her last seizure or episode was over the summer.

## 2018-03-25 NOTE — ED Provider Notes (Signed)
[facility] EMERGENCY DEPARTMENT Provider Note   CSN: [id number] Arrival date & time: 03/25/18  0029     History   Chief Complaint Chief Complaint  Patient presents with  . Seizures    HPI Alicia Brennan is a 67 y.o. female.  Who presents emergency department after seizure.  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  Her daughter states that she was standing at the top of the steps when she suddenly started staring off and her eyes were flickering.  She was unresponsive to voice.  This lasted for approximately 25 minutes until she was in the ambulance at which point she began coming around.  She was mildly postictal until she arrived at the hospital.  Patient states that she did not take her nightly dose of medication because she had her seizure just prior to getting ready to take it.  She is otherwise compliant with her medications.  She does not drink alcohol and has not taken any new medications recently.  She has had no recent illnesses.  HPI  Past Medical History:  Diagnosis Date  . Arthritis   . 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 Hypertension   . IBS (irritable bowel syndrome)   . IBS (irritable bowel syndrome)   . Leg fracture, left   . Recurrent headache   . Sarcoidosis of lung ([place])   . Seizures [facility])     Patient Active Problem List   Diagnosis Date Noted  . Osteoarthritis of both hips 11/15/2015  . Avascular necrosis of bones of both hips ([place]) 11/15/2015  . Solitary pulmonary nodule 05/03/2015  . Localization-related (focal) (partial) idiopathic epilepsy and epileptic syndromes with seizures of localized onset, not intractable, without status epilepticus ([place]) 04/27/2015  . Generalized tonic-clonic seizure ([place]) 04/25/2015  . Cancer ([place])   . Sarcoidosis of lung ([place])   . Hypertension   . Recurrent headache     Past Surgical History:  Procedure Laterality Date  . BILATERAL ANTERIOR TOTAL HIP ARTHROPLASTY  Bilateral 11/15/2015   Procedure: BILATERAL ANTERIOR TOTAL HIP ARTHROPLASTY;  Surgeon: Rod Can, MD;  Location: [place];  Service: Orthopedics;  Laterality: Bilateral;  . CARPAL TUNNEL RELEASE Bilateral   . CHOLECYSTECTOMY    . COLONOSCOPY    . EYE SURGERY     r/t sarcoidisis  . ORIF ELBOW FRACTURE Left   . TIBIA FRACTURE SURGERY Left      OB History   No obstetric history on file.      Home Medications    Prior to Admission medications   Medication Sig Start Date End Date Taking? Authorizing Provider  amLODipine (NORVASC) 10 MG tablet Take 10 mg by mouth daily.    [provider]  aspirin 81 MG tablet Take 81 mg by mouth daily.    [provider]  Cholecalciferol (VITAMIN D3) 50000 units TABS Take 50,000 Units by mouth every Monday.     [provider]  cyclobenzaprine (FLEXERIL) 5 MG tablet Take 1 tablet as needed every 12 hours for neck and back pain. 02/08/16   Cameron Sprang, MD  docusate sodium (COLACE) 100 MG capsule Take 1 capsule (100 mg total) by mouth 2 (two) times daily. 11/16/15   Swinteck, Aaron Edelman, MD  HYDROcodone-acetaminophen (NORCO/VICODIN) 5-325 MG tablet Take 1-2 tablets by mouth every 4 (four) hours as needed (breakthrough pain). Patient not taking: Reported on 07/31/2016 11/16/15   Rod Can, MD  levETIRAcetam (KEPPRA) 1000 MG tablet Take  1 tablet by mouth twice a day 10/05/16   Cameron Sprang, MD  losartan (COZAAR) 100 MG tablet Take 1 tablet (100 mg total) by mouth daily. 11/01/16   Rigoberto Noel, MD  senna (SENOKOT) 8.6 MG TABS tablet Take 2 tablets (17.2 mg total) by mouth at bedtime. 11/16/15   Swinteck, Aaron Edelman, MD  zonisamide (ZONEGRAN) 100 MG capsule Take 3 capsules at night 07/31/16   Cameron Sprang, MD    Family History Family History  Problem Relation Age of Onset  . Dementia Mother   . High blood pressure Father   . Dementia Maternal Aunt   . Sarcoidosis Son   . Seizures Neg Hx     Social History Social History    Tobacco Use  . Smoking status: Never Smoker  . Smokeless tobacco: Never Used  Substance Use Topics  . Alcohol use: No    Alcohol/week: 0.0 standard drinks  . Drug use: No     Allergies   No known allergies   Review of Systems Review of Systems Ten systems reviewed and are negative for acute change, except as noted in the HPI.    Physical Exam Updated Vital Signs Pulse 60   Temp 97.7 F (36.5 C) (Oral)   Resp (!) 23   Ht 5\' 4"  (1.626 m)   Wt 72.6 kg   SpO2 97%   BMI 27.46 kg/m   Physical Exam Vitals signs and nursing note reviewed.  Constitutional:      General: She is not in acute distress.    Appearance: She is well-developed. She is not diaphoretic.  HENT:     Head: Normocephalic and atraumatic.  Eyes:     General: No scleral icterus.    Conjunctiva/sclera: Conjunctivae normal.  Neck:     Musculoskeletal: Normal range of motion.  Cardiovascular:     Rate and Rhythm: Normal rate and regular rhythm.     Heart sounds: Normal heart sounds. No murmur. No friction rub. No gallop.   Pulmonary:     Effort: Pulmonary effort is normal. No respiratory distress.     Breath sounds: Normal breath sounds.  Abdominal:     General: Bowel sounds are normal. There is no distension.     Palpations: Abdomen is soft. There is no mass.     Tenderness: There is no abdominal tenderness. There is no guarding.  Skin:    General: Skin is warm and dry.  Neurological:     Mental Status: She is alert and oriented to person, place, and time.     Comments: Speech is clear and goal oriented, follows commands Major Cranial nerves without deficit, no facial droop Normal strength in upper and lower extremities bilaterally including dorsiflexion and plantar flexion, strong and equal grip strength Sensation normal to light and sharp touch Moves extremities without ataxia, coordination intact Normal finger to nose and rapid alternating movements Neg romberg, no pronator drift Normal  gait Normal heel-shin and balance   Psychiatric:        Behavior: Behavior normal.      ED Treatments / Results  Labs (all labs ordered are listed, but only abnormal results are displayed) Labs Reviewed  BASIC METABOLIC PANEL  CBC WITH DIFFERENTIAL/PLATELET  VALPROIC ACID LEVEL  CBG MONITORING, ED    EKG EKG Interpretation  Date/Time:  Monday March 25 2018 00:43:10 EST Ventricular Rate:  72 PR Interval:    QRS Duration: 101 QT Interval:  466 QTC Calculation: 510 R Axis:  4 Text Interpretation:  Sinus rhythm Atrial premature complex Borderline prolonged PR interval Abnormal R-wave progression, early transition Probable left ventricular hypertrophy Prolonged QT interval Confirmed by Ripley Fraise [phone]) on 03/25/2018 12:48:53 AM   Radiology No results found.  Procedures Procedures (including critical care time)  Medications Ordered in ED Medications - No data to display   Initial Impression / Assessment and Plan / ED Course  I have reviewed the triage vital signs and the nursing notes.  Pertinent labs & imaging results that were available during my care of the patient were reviewed by me and considered in my medical decision making (see chart for details).  Clinical Course as of Mar 25 410  Mon Mar 25, 2018  4497 Valproic Acid,S(!): 36 [AH]    Clinical Course User Index [AH] Margarita Mail, PA-C    Patient with seizure. She had no repeat seizure this evening at the ER. Her Depakote level was low. I consulted with Dr. Cheral Marker who asks for a Keppra loading dose and also wants to change her to Valproic acid DR tablets 250 mg  Tid.  Patient aware of medication chang. She also needs a few Zonegran tablets until she is able to return to [place] to get her RX  This weekend. She appears appropriate for dc at this time. I discussed return precautions.  Final Clinical Impressions(s) / ED Diagnoses   Final diagnoses:  None    ED Discharge Orders    None        Margarita Mail, PA-C 03/25/18 0415    Ripley Fraise, MD 03/25/18 [phone]

## 2018-03-25 NOTE — ED Notes (Signed)
CBG 97 

## 2018-03-25 NOTE — ED Notes (Signed)
Patient verbalizes understanding of discharge instructions. Opportunity for questioning and answers were provided. Armband removed by staff, pt discharged from ED home via POV with family. 

## 2019-08-16 ENCOUNTER — Emergency Department (HOSPITAL_COMMUNITY): Payer: Medicare Other

## 2019-08-16 ENCOUNTER — Emergency Department (HOSPITAL_COMMUNITY)
Admission: EM | Admit: 2019-08-16 | Discharge: 2019-08-16 | Disposition: A | Discharge status: Home / Self Care | Payer: Medicare Other | Attending: Emergency Medicine | Admitting: Emergency Medicine

## 2019-08-16 ENCOUNTER — Encounter (HOSPITAL_COMMUNITY): Payer: Self-pay | Admitting: Emergency Medicine

## 2019-08-16 ENCOUNTER — Other Ambulatory Visit: Payer: Self-pay

## 2019-08-16 DIAGNOSIS — Z96641 Presence of right artificial hip joint: Secondary | ICD-10-CM | POA: Diagnosis not present

## 2019-08-16 DIAGNOSIS — R569 Unspecified convulsions: Secondary | ICD-10-CM

## 2019-08-16 DIAGNOSIS — Z85040 Personal history of malignant carcinoid tumor of rectum: Secondary | ICD-10-CM | POA: Diagnosis not present

## 2019-08-16 DIAGNOSIS — Z96642 Presence of left artificial hip joint: Secondary | ICD-10-CM | POA: Insufficient documentation

## 2019-08-16 DIAGNOSIS — I10 Essential (primary) hypertension: Secondary | ICD-10-CM | POA: Insufficient documentation

## 2019-08-16 DIAGNOSIS — Z79899 Other long term (current) drug therapy: Secondary | ICD-10-CM | POA: Insufficient documentation

## 2019-08-16 LAB — CBC WITH DIFFERENTIAL/PLATELET
Abs Immature Granulocytes: 0.01 10*3/uL (ref 0.00–0.07)
Basophils Absolute: 0 10*3/uL (ref 0.0–0.1)
Basophils Relative: 1 %
Eosinophils Absolute: 0.1 10*3/uL (ref 0.0–0.5)
Eosinophils Relative: 1 %
HCT: 45.6 % (ref 36.0–46.0)
Hemoglobin: 14.5 g/dL (ref 12.0–15.0)
Immature Granulocytes: 0 %
Lymphocytes Relative: 29 %
Lymphs Abs: 1.3 10*3/uL (ref 0.7–4.0)
MCH: 30.1 pg (ref 26.0–34.0)
MCHC: 31.8 g/dL (ref 30.0–36.0)
MCV: 94.8 fL (ref 80.0–100.0)
Monocytes Absolute: 0.4 10*3/uL (ref 0.1–1.0)
Monocytes Relative: 9 %
Neutro Abs: 2.6 10*3/uL (ref 1.7–7.7)
Neutrophils Relative %: 60 %
Platelets: 180 10*3/uL (ref 150–400)
RBC: 4.81 MIL/uL (ref 3.87–5.11)
RDW: 13.8 % (ref 11.5–15.5)
WBC: 4.4 10*3/uL (ref 4.0–10.5)
nRBC: 0 % (ref 0.0–0.2)

## 2019-08-16 LAB — BASIC METABOLIC PANEL
Anion gap: 8 (ref 5–15)
BUN: 8 mg/dL (ref 8–23)
CO2: 22 mmol/L (ref 22–32)
Calcium: 8.8 mg/dL — ABNORMAL LOW (ref 8.9–10.3)
Chloride: 107 mmol/L (ref 98–111)
Creatinine, Ser: 0.78 mg/dL (ref 0.44–1.00)
GFR calc Af Amer: >60 mL/min (ref >60)
GFR calc non Af Amer: >60 mL/min (ref >60)
Glucose, Bld: 90 mg/dL (ref 70–99)
Potassium: 5.2 mmol/L — ABNORMAL HIGH (ref 3.5–5.1)
Sodium: 137 mmol/L (ref 135–145)

## 2019-08-16 LAB — MAGNESIUM: Magnesium: 2.2 mg/dL (ref 1.7–2.4)

## 2019-08-16 LAB — ETHANOL: Alcohol, Ethyl (B): <10 mg/dL (ref <10)

## 2019-08-16 LAB — VALPROIC ACID LEVEL: Valproic Acid Lvl: 61 ug/mL (ref 50.0–100.0)

## 2019-08-16 MED ORDER — SODIUM CHLORIDE 0.9 % IV BOLUS
500.0000 mL | Freq: Once | INTRAVENOUS | Status: AC
  Administered 2019-08-16: 500 mL via INTRAVENOUS

## 2019-08-16 NOTE — ED Triage Notes (Signed)
Pt to triage via [facility] from her daughter's home after seizure.  Daughter found her bent over and postictal.  Pt altered on EMS arrival and is now alert and oriented.  Pt states she has been taking her seizure medication.  Denies any complaints at present.  Last seizure approx 1 year ago.

## 2019-08-16 NOTE — ED Provider Notes (Signed)
[facility] EMERGENCY DEPARTMENT Provider Note   CSN: [id number] Arrival date & time: 08/16/19  1046     History Chief Complaint  Patient presents with  . Seizures    Alicia Brennan is a 69 y.o. female.  HPI   Patient presents after an apparent seizure.  She arrives via EMS from her daughter's house.  Reportedly the patient's daughter found her bent over, and a manner consistent with postictal phase.  EMS reports the patient was altered on their arrival, but became more appropriately interactive in route. Patient notes that she has been taking her seizure medication regularly, does not recall notable recent changes in medicine, diet, activity.  Currently she has no other complaints. Last seizure seems to been about 1 year ago.   Past Medical History:  Diagnosis Date  . Arthritis   . 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 Hypertension   . IBS (irritable bowel syndrome)   . IBS (irritable bowel syndrome)   . Leg fracture, left   . Recurrent headache   . Sarcoidosis of lung ([place])   . Seizures [facility])     Patient Active Problem List   Diagnosis Date Noted  . Osteoarthritis of both hips 11/15/2015  . Avascular necrosis of bones of both hips ([place]) 11/15/2015  . Solitary pulmonary nodule 05/03/2015  . Localization-related (focal) (partial) idiopathic epilepsy and epileptic syndromes with seizures of localized onset, not intractable, without status epilepticus ([place]) 04/27/2015  . Generalized tonic-clonic seizure ([place]) 04/25/2015  . Cancer ([place])   . Sarcoidosis of lung ([place])   . Hypertension   . Recurrent headache     Past Surgical History:  Procedure Laterality Date  . BILATERAL ANTERIOR TOTAL HIP ARTHROPLASTY Bilateral 11/15/2015   Procedure: BILATERAL ANTERIOR TOTAL HIP ARTHROPLASTY;  Surgeon: Rod Can, MD;  Location: [facility];  Service: Orthopedics;  Laterality: Bilateral;  . CARPAL TUNNEL RELEASE Bilateral   . CHOLECYSTECTOMY    . COLONOSCOPY    . EYE  SURGERY     r/t sarcoidisis  . ORIF ELBOW FRACTURE Left   . TIBIA FRACTURE SURGERY Left      OB History   No obstetric history on file.     Family History  Problem Relation Age of Onset  . Dementia Mother   . High blood pressure Father   . Dementia Maternal Aunt   . Sarcoidosis Son   . Seizures Neg Hx     Social History   Tobacco Use  . Smoking status: Never Smoker  . Smokeless tobacco: Never Used  Substance Use Topics  . Alcohol use: No    Alcohol/week: 0.0 standard drinks  . Drug use: No    Home Medications Prior to Admission medications   Medication Sig Start Date End Date Taking? Authorizing Provider  amLODipine (NORVASC) 10 MG tablet Take 10 mg by mouth daily.   Yes [provider]  divalproex (DEPAKOTE) 250 MG DR tablet Take 1 tablet (250 mg total) by mouth 3 (three) times daily. 03/25/18  Yes Harris, Abigail, PA-C  docusate sodium (COLACE) 100 MG capsule Take 1 capsule (100 mg total) by mouth 2 (two) times daily. 11/16/15  Yes Swinteck, Aaron Edelman, MD  levETIRAcetam (KEPPRA) 1000 MG tablet Take 1 tablet by mouth twice a day Patient taking differently: Take 1,000 mg by mouth 2 (two) times daily.  10/05/16  Yes Cameron Sprang, MD  senna (SENOKOT) 8.6 MG TABS tablet Take 2 tablets (17.2 mg total) by mouth at bedtime. 11/16/15  Yes Swinteck, Aaron Edelman, MD  zonisamide (ZONEGRAN) 100 MG capsule Take 3 capsules (300 mg total) by mouth at bedtime. 03/25/18  Yes Harris, Abigail, PA-C  cyclobenzaprine (FLEXERIL) 5 MG tablet Take 1 tablet as needed every 12 hours for neck and back pain. Patient not taking: Reported on 03/25/2018 02/08/16   Cameron Sprang, MD  HYDROcodone-acetaminophen (NORCO/VICODIN) 5-325 MG tablet Take 1-2 tablets by mouth every 4 (four) hours as needed (breakthrough pain). Patient not taking: Reported on 07/31/2016 11/16/15   Rod Can, MD  losartan-hydrochlorothiazide (HYZAAR) 50-12.5 MG tablet Take 1 tablet by mouth daily.    [provider]      Allergies    No known allergies  Review of Systems   Review of Systems  Constitutional:       Per HPI, otherwise negative  HENT:       Per HPI, otherwise negative  Respiratory:       Per HPI, otherwise negative  Cardiovascular:       Per HPI, otherwise negative  Gastrointestinal: Negative for vomiting.  Endocrine:       Negative aside from HPI  Genitourinary:       Neg aside from HPI   Musculoskeletal:       Per HPI, otherwise negative  Skin: Negative.   Neurological: Positive for seizures. Negative for syncope.    Physical Exam Updated Vital Signs BP (!) 154/72   Pulse (!) 59   Temp 98 F (36.7 C) (Oral)   Resp 16   SpO2 99%   Physical Exam Vitals and nursing note reviewed.  Constitutional:      General: She is not in acute distress.    Appearance: She is well-developed.  HENT:     Head: Normocephalic and atraumatic.  Eyes:     Conjunctiva/sclera: Conjunctivae normal.  Cardiovascular:     Rate and Rhythm: Normal rate and regular rhythm.  Pulmonary:     Effort: Pulmonary effort is normal. No respiratory distress.     Breath sounds: Normal breath sounds. No stridor.  Abdominal:     General: There is no distension.  Skin:    General: Skin is warm and dry.  Neurological:     Mental Status: She is alert and oriented to person, place, and time.     Cranial Nerves: No cranial nerve deficit.     ED Results / Procedures / Treatments   Labs (all labs ordered are listed, but only abnormal results are displayed) Labs Reviewed  BASIC METABOLIC PANEL - Abnormal; Notable for the following components:      Result Value   Potassium 5.2 (*)    Calcium 8.8 (*)    All other components within normal limits  VALPROIC ACID LEVEL  CBC WITH DIFFERENTIAL/PLATELET  ETHANOL  MAGNESIUM    EKG None  Radiology CT Head Wo Contrast  Result Date: 08/16/2019 CLINICAL DATA:  Witnessed seizure. EXAM: CT HEAD WITHOUT CONTRAST TECHNIQUE: Contiguous axial images were  obtained from the base of the skull through the vertex without intravenous contrast. COMPARISON:  None. FINDINGS: Brain: No evidence of acute infarction, hemorrhage, hydrocephalus, extra-axial collection or mass lesion/mass effect. Moderate brain parenchymal volume loss. Mild to moderate periventricular microangiopathy. Vascular: Calcific atherosclerotic disease of the intra cavernous carotid arteries. Skull: Normal. Negative for fracture or focal lesion. Sinuses/Orbits: No acute finding. Other: None. IMPRESSION: 1. No acute intracranial abnormality. 2. Moderate brain parenchymal atrophy and chronic microvascular disease. Electronically Signed   By: Fidela Salisbury M.D.   On: 08/16/2019  12:45    Procedures Procedures (including critical care time)  Medications Ordered in ED Medications  sodium chloride 0.9 % bolus 500 mL (500 mLs Intravenous New Bag/Given 08/16/19 1431)    ED Course  I have reviewed the triage vital signs and the nursing notes.  Pertinent labs & imaging results that were available during my care of the patient were reviewed by me and considered in my medical decision making (see chart for details).    3:12 PM Patient awake, alert, in no distress, speaking clearly, hemodynamically unremarkable. Labs now available, reviewed, essentially unremarkable.  Mild hyperkalemia noted, but no other substantial irregularities.  Patient has received fluids here has had no additional seizure activity, there are some suspicion for breakthrough event, but without evidence for status, continued seizure activity, other abnormal findings, patient is appropriate for following up with her neurologist in the coming days.  Patient amenable to this plan. Final Clinical Impression(s) / ED Diagnoses Final diagnoses:  Seizure [facility])     Carmin Muskrat, MD 08/16/19 [phone]

## 2019-08-16 NOTE — ED Notes (Signed)
Pt verbalized understanding of discharge instructions. Follow up care reviewed, pt's son on the way to pick her up.

## 2019-08-16 NOTE — Discharge Instructions (Addendum)
As discussed, your evaluation today has been largely reassuring.  But, it is important that you monitor your condition carefully, and do not hesitate to return to the ED if you develop new, or concerning changes in your condition.  Otherwise, please follow-up with your physician for appropriate ongoing care.  Please be sure to discuss today's episode, and consider your medications to change them as needed.

## 2019-08-16 NOTE — ED Notes (Signed)
Attempted to site iv without success, iv team order placed.

## 2019-08-16 NOTE — ED Notes (Signed)
Iv team at bedside  

## 2021-11-27 ENCOUNTER — Encounter (HOSPITAL_COMMUNITY): Payer: Self-pay | Admitting: Emergency Medicine

## 2021-11-27 ENCOUNTER — Emergency Department (HOSPITAL_COMMUNITY)
Admission: EM | Admit: 2021-11-27 | Discharge: 2021-11-27 | Disposition: A | Discharge status: Home / Self Care | Payer: Medicare HMO | Attending: Emergency Medicine | Admitting: Emergency Medicine

## 2021-11-27 ENCOUNTER — Emergency Department (HOSPITAL_COMMUNITY): Payer: Medicare HMO

## 2021-11-27 ENCOUNTER — Other Ambulatory Visit: Payer: Self-pay

## 2021-11-27 ENCOUNTER — Emergency Department (HOSPITAL_BASED_OUTPATIENT_CLINIC_OR_DEPARTMENT_OTHER): Payer: Medicare HMO

## 2021-11-27 DIAGNOSIS — R52 Pain, unspecified: Secondary | ICD-10-CM | POA: Diagnosis not present

## 2021-11-27 DIAGNOSIS — R531 Weakness: Secondary | ICD-10-CM | POA: Diagnosis present

## 2021-11-27 DIAGNOSIS — Z20822 Contact with and (suspected) exposure to covid-19: Secondary | ICD-10-CM | POA: Insufficient documentation

## 2021-11-27 DIAGNOSIS — R5383 Other fatigue: Secondary | ICD-10-CM

## 2021-11-27 DIAGNOSIS — E876 Hypokalemia: Secondary | ICD-10-CM

## 2021-11-27 LAB — DIFFERENTIAL
Abs Immature Granulocytes: 0.02 10*3/uL (ref 0.00–0.07)
Basophils Absolute: 0 10*3/uL (ref 0.0–0.1)
Basophils Relative: 0 %
Eosinophils Absolute: 0 10*3/uL (ref 0.0–0.5)
Eosinophils Relative: 0 %
Immature Granulocytes: 0 %
Lymphocytes Relative: 11 %
Lymphs Abs: 1 10*3/uL (ref 0.7–4.0)
Monocytes Absolute: 0.7 10*3/uL (ref 0.1–1.0)
Monocytes Relative: 8 %
Neutro Abs: 6.9 10*3/uL (ref 1.7–7.7)
Neutrophils Relative %: 81 %

## 2021-11-27 LAB — COMPREHENSIVE METABOLIC PANEL
ALT: 14 U/L (ref 0–44)
AST: 22 U/L (ref 15–41)
Albumin: 3.3 g/dL — ABNORMAL LOW (ref 3.5–5.0)
Alkaline Phosphatase: 90 U/L (ref 38–126)
Anion gap: 8 (ref 5–15)
BUN: 8 mg/dL (ref 8–23)
CO2: 19 mmol/L — ABNORMAL LOW (ref 22–32)
Calcium: 9.2 mg/dL (ref 8.9–10.3)
Chloride: 107 mmol/L (ref 98–111)
Creatinine, Ser: 0.9 mg/dL (ref 0.44–1.00)
GFR, Estimated: >60 mL/min (ref >60)
Glucose, Bld: 122 mg/dL — ABNORMAL HIGH (ref 70–99)
Potassium: 3.4 mmol/L — ABNORMAL LOW (ref 3.5–5.1)
Sodium: 134 mmol/L — ABNORMAL LOW (ref 135–145)
Total Bilirubin: 0.7 mg/dL (ref 0.3–1.2)
Total Protein: 7.3 g/dL (ref 6.5–8.1)

## 2021-11-27 LAB — APTT: aPTT: 30 seconds (ref 24–36)

## 2021-11-27 LAB — I-STAT VENOUS BLOOD GAS, ED
Acid-base deficit: 3 mmol/L — ABNORMAL HIGH (ref 0.0–2.0)
Bicarbonate: 19.8 mmol/L — ABNORMAL LOW (ref 20.0–28.0)
Calcium, Ion: 1.16 mmol/L (ref 1.15–1.40)
HCT: 43 % (ref 36.0–46.0)
Hemoglobin: 14.6 g/dL (ref 12.0–15.0)
O2 Saturation: 90 %
Potassium: 3.5 mmol/L (ref 3.5–5.1)
Sodium: 136 mmol/L (ref 135–145)
TCO2: 21 mmol/L — ABNORMAL LOW (ref 22–32)
pCO2, Ven: 30.4 mmHg — ABNORMAL LOW (ref 44–60)
pH, Ven: 7.422 (ref 7.25–7.43)
pO2, Ven: 56 mmHg — ABNORMAL HIGH (ref 32–45)

## 2021-11-27 LAB — CBC
HCT: 44.8 % (ref 36.0–46.0)
Hemoglobin: 15 g/dL (ref 12.0–15.0)
MCH: 30.9 pg (ref 26.0–34.0)
MCHC: 33.5 g/dL (ref 30.0–36.0)
MCV: 92.4 fL (ref 80.0–100.0)
Platelets: 163 10*3/uL (ref 150–400)
RBC: 4.85 MIL/uL (ref 3.87–5.11)
RDW: 12.9 % (ref 11.5–15.5)
WBC: 8.6 10*3/uL (ref 4.0–10.5)
nRBC: 0 % (ref 0.0–0.2)

## 2021-11-27 LAB — URINALYSIS, ROUTINE W REFLEX MICROSCOPIC
Bilirubin Urine: NEGATIVE
Glucose, UA: NEGATIVE mg/dL
Hgb urine dipstick: NEGATIVE
Ketones, ur: 5 mg/dL — AB
Leukocytes,Ua: NEGATIVE
Nitrite: NEGATIVE
Protein, ur: NEGATIVE mg/dL
Specific Gravity, Urine: 1.015 (ref 1.005–1.030)
pH: 7 (ref 5.0–8.0)

## 2021-11-27 LAB — PROTIME-INR
INR: 1.1 (ref 0.8–1.2)
Prothrombin Time: 13.8 seconds (ref 11.4–15.2)

## 2021-11-27 LAB — TROPONIN I (HIGH SENSITIVITY)
Troponin I (High Sensitivity): 10 ng/L (ref <18)
Troponin I (High Sensitivity): 9 ng/L (ref <18)

## 2021-11-27 LAB — SARS CORONAVIRUS 2 BY RT PCR: SARS Coronavirus 2 by RT PCR: NEGATIVE

## 2021-11-27 MED ORDER — LEVETIRACETAM 500 MG PO TABS
1000.0000 mg | ORAL_TABLET | Freq: Once | ORAL | Status: AC
Start: 2021-11-27 — End: 2021-11-27
  Administered 2021-11-27: 1000 mg via ORAL
  Filled 2021-11-27: qty 2

## 2021-11-27 MED ORDER — KETOROLAC TROMETHAMINE 30 MG/ML IJ SOLN
30.0000 mg | Freq: Once | INTRAMUSCULAR | Status: AC
  Administered 2021-11-27: 30 mg via INTRAVENOUS
  Filled 2021-11-27: qty 1

## 2021-11-27 MED ORDER — POTASSIUM CHLORIDE ER 10 MEQ PO TBCR: 20.0000 meq | EXTENDED_RELEASE_TABLET | Freq: Every day | ORAL | 0 refills | Status: DC

## 2021-11-27 NOTE — ED Notes (Signed)
Patient transported to vascular. 

## 2021-11-27 NOTE — Discharge Instructions (Addendum)
You were seen in the ER today for generalized fatigue and weakness and generally feeling unwell.  Overall your work-up including EKG, labs, urine, CT scan of the head and other imaging were reassuring and showed no obvious cause for your symptoms.  You do not have a UTI and there was no abnormal findings on the CT scan of your head.  There is no evidence of blood clot in your arm.  Your potassium was only slightly low and we have sent you home with some oral supplementation but we do not think it is contributing to your symptoms.  Please follow-up with your primary care physician regarding your visit to the emergency department today and discuss your concerns.  Come back if you have any severe worsening weakness, falls, chest pain, shortness of breath, high fevers, or any other symptoms concerning to you.

## 2021-11-27 NOTE — ED Provider Notes (Signed)
  Physical Exam  BP 139/70   Pulse (!) 54   Temp 98.4 F (36.9 C) (Oral)   Resp 17   Ht '5\' 4"'$  (1.626 m)   Wt 68 kg   SpO2 97%   BMI 25.75 kg/m   Physical Exam  Procedures  Procedures  ED Course / MDM   Clinical Course as of 11/27/21 2318  Sun Nov 27, 2021  1508 Received signout from previous attending.  Increased weakness since Thursday and sleeping more than usual. CTH unremarkable. COVID neg. No anemia. UA pending. EKG NSR no ischemic change.   Follow up UA. [VB]  1511 Signed out to Dr Nechama Guard pending UA.  All other workup unremarkable at this time. [MT]  3875 VBG with normal pH 7.42, mild respiratory alkalosis PCO2 30.  UA with no evidence of UTI.  Troponin is pending.  No DVT on ultrasound. [VB]  6433 Patient's troponins have been stable and down trended from 10-9.  Her UA was not consistent with UTI.  I have reassessed patient and she is feeling better and would like to go home.  I have discussed the findings with the patient and the patient's family members at the bedside and they are in agreement with discharge at this time and will follow-up with her PCP regarding her symptoms.  I have also sent home with 3 days of oral supplementation of potassium and advise recheck. [VB]    Clinical Course User Index [MT] Trifan, Carola Rhine, MD [VB] Elgie Congo, MD   Medical Decision Making Amount and/or Complexity of Data Reviewed Labs: ordered. Radiology: ordered.  Risk Prescription drug management.          Elgie Congo, MD 11/27/21 [phone]

## 2021-11-27 NOTE — Progress Notes (Signed)
VASCULAR LAB    Right upper extremity venous duplex has been performed.  See CV proc for preliminary results.  Messaged negative results to Dr. Nechama Guard via secure chat  Sharion Dove, RVT 11/27/2021, 4:25 PM

## 2021-11-27 NOTE — ED Notes (Signed)
Patient transported to CT 

## 2021-11-27 NOTE — ED Triage Notes (Signed)
Patient into triage in wheelchair by POV c/o laying in bed over the past few days. Reports decreased appetite, incontinence and dizziness. Patient states when waking this morning she felt like she could barely keep her head up. Patient having issues with her right hand. States it is painful and she is unable to make a fist. Patient unsure of when this started since she has been in bed for days.

## 2021-11-27 NOTE — ED Provider Notes (Signed)
[facility] EMERGENCY DEPARTMENT Provider Note   CSN: [id number] Arrival date & time: 11/27/21  1029     History  Chief Complaint  Patient presents with   Weakness    Alicia Brennan is a 71 y.o. female presenting from home with generalized weakness.  The patient is in the company of her daughter at bedside.  They report the patient has had worsening of generalized weakness, dizziness, "incontinence" and poor appetite for the past 4 days, since Thursday.  Patient is also having chronic myalgias and pain in her right upper arm.  HPI     Home Medications Prior to Admission medications   Medication Sig Start Date End Date Taking? Authorizing Provider  amLODipine (NORVASC) 10 MG tablet Take 10 mg by mouth daily.    [provider]  cyclobenzaprine (FLEXERIL) 5 MG tablet Take 1 tablet as needed every 12 hours for neck and back pain. Patient not taking: Reported on 03/25/2018 02/08/16   Cameron Sprang, MD  divalproex (DEPAKOTE) 250 MG DR tablet Take 1 tablet (250 mg total) by mouth 3 (three) times daily. 03/25/18   Margarita Mail, PA-C  docusate sodium (COLACE) 100 MG capsule Take 1 capsule (100 mg total) by mouth 2 (two) times daily. 11/16/15   Swinteck, Aaron Edelman, MD  HYDROcodone-acetaminophen (NORCO/VICODIN) 5-325 MG tablet Take 1-2 tablets by mouth every 4 (four) hours as needed (breakthrough pain). Patient not taking: Reported on 07/31/2016 11/16/15   Rod Can, MD  levETIRAcetam (KEPPRA) 1000 MG tablet Take 1 tablet by mouth twice a day Patient taking differently: Take 1,000 mg by mouth 2 (two) times daily.  10/05/16   Cameron Sprang, MD  losartan-hydrochlorothiazide (HYZAAR) 50-12.5 MG tablet Take 1 tablet by mouth daily.    [provider]  senna (SENOKOT) 8.6 MG TABS tablet Take 2 tablets (17.2 mg total) by mouth at bedtime. 11/16/15   Swinteck, Aaron Edelman, MD  zonisamide (ZONEGRAN) 100 MG capsule Take 3 capsules (300 mg total) by mouth at bedtime.  03/25/18   Margarita Mail, PA-C      Allergies    No known allergies    Review of Systems   Review of Systems  Physical Exam Updated Vital Signs BP 120/65   Pulse (!) 57   Temp (!) 97.5 F (36.4 C) (Oral)   Resp 16   Ht '5\' 4"'$  (1.626 m)   Wt 68 kg   SpO2 93%   BMI 25.75 kg/m  Physical Exam  ED Results / Procedures / Treatments   Labs (all labs ordered are listed, but only abnormal results are displayed) Labs Reviewed  COMPREHENSIVE METABOLIC PANEL - Abnormal; Notable for the following components:      Result Value   Sodium 134 (*)    Potassium 3.4 (*)    CO2 19 (*)    Glucose, Bld 122 (*)    Albumin 3.3 (*)    All other components within normal limits  SARS CORONAVIRUS 2 BY RT PCR  PROTIME-INR  APTT  CBC  DIFFERENTIAL  URINALYSIS, ROUTINE W REFLEX MICROSCOPIC    EKG EKG Interpretation  Date/Time:  Sunday November 27 2021 10:39:48 EDT Ventricular Rate:  52 PR Interval:  174 QRS Duration: 90 QT Interval:  482 QTC Calculation: 448 R Axis:   9 Text Interpretation: Sinus bradycardia Minimal voltage criteria for LVH, may be normal variant ( R in aVL ) Borderline ECG When compared with ECG of 25-Mar-2018 00:43, PREVIOUS ECG IS PRESENT Confirmed by Octaviano Glow [phone]) on  11/27/2021 12:58:56 PM  Radiology CT HEAD WO CONTRAST  Result Date: 11/27/2021 CLINICAL DATA:  Right upper extremity weakness.  Dizziness. EXAM: CT HEAD WITHOUT CONTRAST TECHNIQUE: Contiguous axial images were obtained from the base of the skull through the vertex without intravenous contrast. RADIATION DOSE REDUCTION: This exam was performed according to the departmental dose-optimization program which includes automated exposure control, adjustment of the mA and/or kV according to patient size and/or use of iterative reconstruction technique. COMPARISON:  08/16/2019 FINDINGS: Brain: There is no evidence for acute hemorrhage, hydrocephalus, mass lesion, or abnormal extra-axial fluid collection. No  definite CT evidence for acute infarction. Diffuse loss of parenchymal volume is consistent with atrophy. Patchy low attenuation in the deep hemispheric and periventricular white matter is nonspecific, but likely reflects chronic microvascular ischemic demyelination. Vascular: No hyperdense vessel or unexpected calcification. Skull: Normal. Negative for fracture or focal lesion. Sinuses/Orbits: No acute finding. Other: None. IMPRESSION: 1. Stable.  No acute intracranial abnormality. 2. Atrophy with chronic small vessel ischemic disease. Electronically Signed   By: Misty Stanley M.D.   On: 11/27/2021 11:56    Procedures Procedures    Medications Ordered in ED Medications  ketorolac (TORADOL) 30 MG/ML injection 30 mg (30 mg Intravenous Given 11/27/21 1324)    ED Course/ Medical Decision Making/ A&P Clinical Course as of 11/27/21 1539  Sun Nov 27, 2021  1508 Received signout from previous attending.  Increased weakness since Thursday and sleeping more than usual. CTH unremarkable. COVID neg. No anemia. UA pending. EKG NSR no ischemic change.   Follow up UA. [VB]  1511 Signed out to Dr Nechama Guard pending UA.  All other workup unremarkable at this time. [MT]    Clinical Course User Index [MT] Alta Goding, Carola Rhine, MD [VB] Elgie Congo, MD                           Medical Decision Making Amount and/or Complexity of Data Reviewed Labs: ordered. Radiology: ordered.  Risk Prescription drug management.   This patient presents to the ED with concern for arm pain, generalized weakness for 4 days. This involves an extensive number of treatment options, and is a complaint that carries with it a high risk of complications and morbidity.  The differential diagnosis includes viral illness versus other infection versus anemia versus arrhythmia  Arm pain she appears to have hyperalgesia of entire right arm on exam.  This may be due to peripheral neuropathy or nerve entrapment; I do not see any signs  of swelling for DVT, but will order vascular ultrasound  Additional history obtained from patient's daughter at bedside   I ordered and personally interpreted labs.  The pertinent results include: No significant findings on labs.  UA pending at the time of admission  I ordered imaging studies including CT scan of the head I independently visualized and interpreted imaging which showed no acute stroke or infarct I agree with the radiologist interpretation  The patient was maintained on a cardiac monitor.  I personally viewed and interpreted the cardiac monitored which showed an underlying rhythm of: Sinus rhythm  Per my interpretation the patient's ECG shows no acute ischemic finding  I ordered medication including Toradol for arm pain  After the interventions noted above, I reevaluated the patient and found that they have: stayed the same   Dispostion:  Signout  to Dr Nechama Guard        Final Clinical Impression(s) / ED Diagnoses Final diagnoses:  None    Rx / DC Orders ED Discharge Orders     None         Takiah Maiden, Carola Rhine, MD 11/27/21 1546

## 2022-06-08 ENCOUNTER — Encounter: Payer: Self-pay | Admitting: Diagnostic Neuroimaging

## 2022-06-08 ENCOUNTER — Ambulatory Visit (INDEPENDENT_AMBULATORY_CARE_PROVIDER_SITE_OTHER): Payer: Medicare HMO | Admitting: Diagnostic Neuroimaging

## 2022-06-08 VITALS — BP 145/81 | HR 69 | Ht 61.0 in | Wt 152.0 lb

## 2022-06-08 DIAGNOSIS — R413 Other amnesia: Secondary | ICD-10-CM

## 2022-06-08 DIAGNOSIS — G40009 Localization-related (focal) (partial) idiopathic epilepsy and epileptic syndromes with seizures of localized onset, not intractable, without status epilepticus: Secondary | ICD-10-CM

## 2022-06-08 DIAGNOSIS — G40409 Other generalized epilepsy and epileptic syndromes, not intractable, without status epilepticus: Secondary | ICD-10-CM | POA: Diagnosis not present

## 2022-06-08 NOTE — Progress Notes (Signed)
[facility]  PATIENT: Alicia Brennan DOB: [birth year]  REFERRING CLINICIAN: Dellia Beckwith, NP HISTORY FROM: patient  REASON FOR VISIT: new consult   HISTORICAL  CHIEF COMPLAINT:  Chief Complaint  Patient presents with   Follow-up   New Patient (Initial Visit)    Patient in room #7 with her daughter. Patient states she here to discuss her seizures.     HISTORY OF PRESENT ILLNESS:   72 year old female here for second opinion evaluation of seizure disorder.  Patient had a fall in 2012 and developed frontal subdural hematoma.  Within a few years she started having episodes of staring, repetitive movements of her fingers and hands.  In 2017 she had a generalized convulsive seizure.  She was diagnosed with seizure disorder and started on Keppra '500mg'$  twice a day.  She was managed by several neurologists over the years including [facility] neurology, [facility] neurology, [facility] neurology and then [facility] neurology.  She tried prior medications including Vimpat, Depakote, Keppra and zonisamide.  Patient had breakthrough seizure in July and August 2023 and therefore Depakote dose was increased.  Patient then developed fairly acute generalized malaise and decline with confusion.  She had a hospitalized diagnosed with possible toxic metabolic encephalopathy, possibly related to increased Depakote.  Medications were adjusted and eventually patient gradually improved over the course of several months.  Patient continued to have 2-3 minor seizures and staring spells per month.  Patient also was referred to cognitive behavioral clinic on October 2023 due to progressive memory, gait difficulty, urinary incontinence.  Differential diagnosis of vascular dementia, neurodegenerative dementia, NPH was raised.  NPH was felt not to be likely.  She had CSF analysis to look for Alzheimer's related dementia but this was negative.  Therefore possibility of mild vascular  dementia remained as a possible diagnosis.   REVIEW OF SYSTEMS: Full 14 system review of systems performed and negative with exception of: as per hPI.  ALLERGIES: Allergies  Allergen Reactions   No Known Allergies     HOME MEDICATIONS: Outpatient Medications Prior to Visit  Medication Sig Dispense Refill   amLODipine (NORVASC) 10 MG tablet Take 10 mg by mouth daily.     atenolol-chlorthalidone (TENORETIC) 50-25 MG tablet Take 1 tablet by mouth daily.     divalproex (DEPAKOTE) 250 MG DR tablet Take 1 tablet (250 mg total) by mouth 3 (three) times daily. 90 tablet 0   docusate sodium (COLACE) 100 MG capsule Take 1 capsule (100 mg total) by mouth 2 (two) times daily. 60 capsule 3   Ferrous Sulfate (IRON PO) Take 1 tablet by mouth daily.     FLUoxetine (PROZAC) 20 MG/5ML solution Take 10 mg by mouth daily.     levETIRAcetam (KEPPRA) 100 MG/ML solution Take 1,000 mg by mouth 2 (two) times daily.     loperamide (IMODIUM A-D) 2 MG tablet Take 2 mg by mouth daily.     losartan-hydrochlorothiazide (HYZAAR) 50-12.5 MG tablet Take 1 tablet by mouth daily.     senna (SENOKOT) 8.6 MG TABS tablet Take 2 tablets (17.2 mg total) by mouth at bedtime. 60 each 3   levETIRAcetam (KEPPRA) 1000 MG tablet Take 1 tablet by mouth twice a day (Patient taking differently: Take 1,000 mg by mouth 2 (two) times daily.) 60 tablet 12   potassium chloride (KLOR-CON) 10 MEQ tablet Take 2 tablets (20 mEq total) by mouth daily for 3 days. 6 tablet 0   cyclobenzaprine (FLEXERIL) 5 MG tablet Take 1 tablet as needed  every 12 hours for neck and back pain. (Patient not taking: Reported on 03/25/2018) 30 tablet 5   HYDROcodone-acetaminophen (NORCO/VICODIN) 5-325 MG tablet Take 1-2 tablets by mouth every 4 (four) hours as needed (breakthrough pain). (Patient not taking: Reported on 07/31/2016) 80 tablet 0   zonisamide (ZONEGRAN) 100 MG capsule Take 3 capsules (300 mg total) by mouth at bedtime. (Patient not taking: Reported on  06/08/2022) 21 capsule 0   No facility-administered medications prior to visit.    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   Hypertension    IBS (irritable bowel syndrome)    IBS (irritable bowel syndrome)    Leg fracture, left    Recurrent headache    Sarcoidosis of lung (HCC)    Seizures ([place])     PAST SURGICAL HISTORY: Past Surgical History:  Procedure Laterality Date   BILATERAL ANTERIOR TOTAL HIP ARTHROPLASTY Bilateral 11/15/2015   Procedure: BILATERAL ANTERIOR TOTAL HIP ARTHROPLASTY;  Surgeon: Rod Can, MD;  Location: [facility];  Service: Orthopedics;  Laterality: Bilateral;   CARPAL TUNNEL RELEASE Bilateral    CHOLECYSTECTOMY     COLONOSCOPY     EYE SURGERY     r/t sarcoidisis   ORIF ELBOW FRACTURE Left    TIBIA FRACTURE SURGERY Left     FAMILY HISTORY: Family History  Problem Relation Age of Onset   Dementia Mother    High blood pressure Father    Dementia Maternal Aunt    Sarcoidosis Son    Seizures Neg Hx     SOCIAL HISTORY: Social History   Socioeconomic History   Marital status: Single    Spouse name: Not on file   Number of children: 3   Years of education: Not on file   Highest education level: Not on file  Occupational History   Occupation: Retired  Tobacco Use   Smoking status: Never   Smokeless tobacco: Never  Vaping Use   Vaping Use: Never used  Substance and Sexual Activity   Alcohol use: No    Alcohol/week: 0.0 standard drinks of alcohol   Drug use: No   Sexual activity: Not on file  Other Topics Concern   Not on file  Social History Narrative   Not on file   Social Determinants of Health   Financial Resource Strain: Not on file  Food Insecurity: Not on file  Transportation Needs: Not on file  Physical Activity: Not on file  Stress: Not on file  Social Connections: Not on file  Intimate Partner Violence: Not on file     PHYSICAL EXAM  GENERAL  EXAM/CONSTITUTIONAL: Vitals:  Vitals:   06/08/22 1553  BP: (!) 145/81  Pulse: 69  Weight: 152 lb (68.9 kg)  Height: '5\' 1"'$  (1.549 m)   Body mass index is 28.72 kg/m. Wt Readings from Last 3 Encounters:  06/08/22 152 lb (68.9 kg)  11/27/21 150 lb (68 kg)  03/25/18 160 lb (72.6 kg)   Patient is in no distress; well developed, nourished and groomed; neck is supple  CARDIOVASCULAR: Examination of carotid arteries is normal; no carotid bruits Regular rate and rhythm, no murmurs Examination of peripheral vascular system by observation and palpation is normal  EYES: Ophthalmoscopic exam of optic discs and posterior segments is normal; no papilledema or hemorrhages No results found.  MUSCULOSKELETAL: Gait, strength, tone, movements noted in Neurologic exam below  NEUROLOGIC: MENTAL STATUS:     06/08/2022  4:01 PM 04/27/2015   11:16 AM  MMSE - Mini Mental State Exam  Orientation to time 5 5  Orientation to Place 5 5  Registration 3 3  Attention/ Calculation 0 5  Recall 3 1  Language- name 2 objects 2 2  Language- repeat 1 1  Language- follow 3 step command 3 3  Language- read & follow direction 1 1  Write a sentence 1 1  Copy design 1 1  Total score 25 28   awake, alert, oriented to person, place and time recent and remote memory intact normal attention and concentration language fluent, comprehension intact, naming intact fund of knowledge appropriate  CRANIAL NERVE:  2nd - no papilledema on fundoscopic exam 2nd, 3rd, 4th, 6th - pupils equal and reactive to light, visual fields full to confrontation, extraocular muscles intact, no nystagmus 5th - facial sensation symmetric 7th - facial strength symmetric 8th - hearing intact 9th - palate elevates symmetrically, uvula midline 11th - shoulder shrug symmetric 12th - tongue protrusion midline  MOTOR:  normal bulk and tone, 4+ strength in the BUE, BLE  SENSORY:  normal and symmetric to light touch, temperature,  vibration  COORDINATION:  finger-nose-finger, fine finger movements normal  REFLEXES:  deep tendon reflexes TRACE and symmetric  GAIT/STATION:  DIFFICULTY STANDING; USING WALKER; UNSTEADY     DIAGNOSTIC DATA (LABS, IMAGING, TESTING) - I reviewed patient records, labs, notes, testing and imaging myself where available.  Lab Results  Component Value Date   WBC 8.6 11/27/2021   HGB 14.6 11/27/2021   HCT 43.0 11/27/2021   MCV 92.4 11/27/2021   PLT 163 11/27/2021      Component Value Date/Time   NA 136 11/27/2021 1755   NA 136 05/17/2013 1525   K 3.5 11/27/2021 1755   K 4.2 05/17/2013 1525   CL 107 11/27/2021 1215   CL 107 05/17/2013 1525   CO2 19 (L) 11/27/2021 1215   CO2 26 05/17/2013 1525   GLUCOSE 122 (H) 11/27/2021 1215   GLUCOSE 85 05/17/2013 1525   BUN 8 11/27/2021 1215   BUN 10 05/17/2013 1525   CREATININE 0.90 11/27/2021 1215   CREATININE 0.82 05/17/2013 1525   CALCIUM 9.2 11/27/2021 1215   CALCIUM 8.7 05/17/2013 1525   PROT 7.3 11/27/2021 1215   PROT 7.6 05/17/2013 1525   ALBUMIN 3.3 (L) 11/27/2021 1215   ALBUMIN 3.3 (L) 05/17/2013 1525   AST 22 11/27/2021 1215   AST 31 05/17/2013 1525   ALT 14 11/27/2021 1215   ALT 22 05/17/2013 1525   ALKPHOS 90 11/27/2021 1215   ALKPHOS 143 (H) 05/17/2013 1525   BILITOT 0.7 11/27/2021 1215   BILITOT 0.7 05/17/2013 1525   GFRNONAA >60 11/27/2021 1215   GFRNONAA >60 05/17/2013 1525   GFRAA >60 08/16/2019 1421   GFRAA >60 05/17/2013 1525   No results found for: "CHOL", "HDL", "LDLCALC", "LDLDIRECT", "TRIG", "CHOLHDL" No results found for: "HGBA1C" No results found for: "VITAMINB12" No results found for: "TSH"   05/13/16 MRI brain 1.  No acute intracranial abnormality. 2. Stable MRI appearance of the brain since 2016, negative except for mild to moderate nonspecific cerebral white matter signal changes.  12/10/21 MRI brain - Moderate chronic small vessel ischemic changes with prominent global cerebral atrophy  without a definite lobar predilection. No acute intracranial abnormality.     ASSESSMENT AND PLAN  72 y.o. year old female here with:   Dx:  1. Generalized tonic-clonic seizure ([place])     PLAN:  SEIZURE  DISORDER (since ~2012 subdural hematoma; dx 2016; staring, automatisms, gen convulsive x 20+ events; last generalized sz in Aug 2023; minor sz in Feb 2024; ~ 3 minor sz per month nowadays) - continue divalproex ER '500mg'$  daily (could not tolerate '1000mg'$  daily) - continue levetiracetam '1000mg'$  twice a day - (consider vimpat or onfi in future) - follow up with [facility] epilepsy clinic (main clinic with epilepsy attending requested); recommend to continue care with academic center until diagnosis and seizures are better stabilized  MILD MEMORY LOSS, INCONTINENCE, GAIT DIFFICULTY (MMSE 25/30; CSF testing negative for amyloid pathology; ddx: MCI, deconditioning, mild vascular dementia, PSP, MSA-c, dementia with lewy bodies) - follow up with [facility] cognitive-behavioral clinic for definitive diagnosis - safety / supervision issues reviewed - daily physical activity / exercise (at least 15-30 minutes) - eat more plants / vegetables - increase social activities, brain stimulation, games, puzzles, hobbies, crafts, arts, music - aim for at least 7-8 hours sleep per night (or more) - avoid smoking and alcohol - caregiver resources provided - caution with medications, finances; no driving  Return for pending if symptoms worsen or fail to improve, return to referring provider.  I spent 75 minutes of face-to-face and non-face-to-face time with patient.  This included previsit chart review, lab review, study review, order entry, electronic health record documentation, patient education.     Penni Bombard, MD [id number], [id number] PM Certified in Neurology, Neurophysiology and Neuroimaging  [facility] [address] [place],  [address] [phone]

## 2022-06-08 NOTE — Patient Instructions (Addendum)
  SEIZURE DISORDER (since ~2012 subdural hematoma; dx 2016; staring, automatisms, gen convulsive x 20+ events; last generalized sz in Aug 2023; minor sz in Feb 2024; ~ 3 minor sz per month nowadays) - continue divalproex ER 500mg  daily (could not tolerate 1000mg  daily) - continue levetiracetam 1000mg  twice a day - (consider vimpat or onfi in future) - follow up with [facility] epilepsy clinic  MILD MEMORY LOSS (MMSE 25/30; CSF testing negative for amyloid pathology; MCI vs mild vascular dementia) - safety / supervision issues reviewed - daily physical activity / exercise (at least 15-30 minutes) - eat more plants / vegetables - increase social activities, brain stimulation, games, puzzles, hobbies, crafts, arts, music - aim for at least 7-8 hours sleep per night (or more) - avoid smoking and alcohol - caregiver resources provided - caution with medications, finances; no driving - follow up with [facility] behavioral clinic

## 2022-09-22 ENCOUNTER — Other Ambulatory Visit: Payer: Self-pay | Admitting: Family Medicine

## 2022-09-22 DIAGNOSIS — Z1231 Encounter for screening mammogram for malignant neoplasm of breast: Secondary | ICD-10-CM

## 2022-09-27 ENCOUNTER — Inpatient Hospital Stay
Admission: RE | Admit: 2022-09-27 | Discharge: 2022-09-27 | Disposition: A | Discharge status: Home / Self Care | Payer: Self-pay | Source: Ambulatory Visit | Attending: Family Medicine | Admitting: Family Medicine

## 2022-09-27 ENCOUNTER — Other Ambulatory Visit: Payer: Self-pay | Admitting: *Deleted

## 2022-09-27 DIAGNOSIS — Z1231 Encounter for screening mammogram for malignant neoplasm of breast: Secondary | ICD-10-CM

## 2023-04-05 ENCOUNTER — Ambulatory Visit: Payer: Medicare HMO | Admitting: Podiatry

## 2023-09-11 ENCOUNTER — Emergency Department (HOSPITAL_COMMUNITY)

## 2023-09-11 ENCOUNTER — Observation Stay (HOSPITAL_COMMUNITY)
Admission: EM | Admit: 2023-09-11 | Discharge: 2023-09-13 | Disposition: A | Discharge status: Home Health | Attending: Internal Medicine | Admitting: Internal Medicine

## 2023-09-11 DIAGNOSIS — Z79899 Other long term (current) drug therapy: Secondary | ICD-10-CM | POA: Insufficient documentation

## 2023-09-11 DIAGNOSIS — G40909 Epilepsy, unspecified, not intractable, without status epilepticus: Secondary | ICD-10-CM | POA: Diagnosis not present

## 2023-09-11 DIAGNOSIS — G9340 Encephalopathy, unspecified: Secondary | ICD-10-CM | POA: Insufficient documentation

## 2023-09-11 DIAGNOSIS — Z85048 Personal history of other malignant neoplasm of rectum, rectosigmoid junction, and anus: Secondary | ICD-10-CM | POA: Insufficient documentation

## 2023-09-11 DIAGNOSIS — R918 Other nonspecific abnormal finding of lung field: Secondary | ICD-10-CM | POA: Diagnosis not present

## 2023-09-11 DIAGNOSIS — D860 Sarcoidosis of lung: Secondary | ICD-10-CM | POA: Diagnosis not present

## 2023-09-11 DIAGNOSIS — I10 Essential (primary) hypertension: Secondary | ICD-10-CM | POA: Diagnosis not present

## 2023-09-11 DIAGNOSIS — R569 Unspecified convulsions: Secondary | ICD-10-CM

## 2023-09-11 DIAGNOSIS — G40009 Localization-related (focal) (partial) idiopathic epilepsy and epileptic syndromes with seizures of localized onset, not intractable, without status epilepticus: Secondary | ICD-10-CM

## 2023-09-11 DIAGNOSIS — R2689 Other abnormalities of gait and mobility: Secondary | ICD-10-CM | POA: Insufficient documentation

## 2023-09-11 DIAGNOSIS — R600 Localized edema: Secondary | ICD-10-CM | POA: Diagnosis present

## 2023-09-11 LAB — CBC WITH DIFFERENTIAL/PLATELET
Abs Immature Granulocytes: 0.01 10*3/uL (ref 0.00–0.07)
Basophils Absolute: 0 10*3/uL (ref 0.0–0.1)
Basophils Relative: 0 %
Eosinophils Absolute: 0.1 10*3/uL (ref 0.0–0.5)
Eosinophils Relative: 2 %
HCT: 47.8 % — ABNORMAL HIGH (ref 36.0–46.0)
Hemoglobin: 15.3 g/dL — ABNORMAL HIGH (ref 12.0–15.0)
Immature Granulocytes: 0 %
Lymphocytes Relative: 39 %
Lymphs Abs: 1.9 10*3/uL (ref 0.7–4.0)
MCH: 30.4 pg (ref 26.0–34.0)
MCHC: 32 g/dL (ref 30.0–36.0)
MCV: 94.8 fL (ref 80.0–100.0)
Monocytes Absolute: 0.5 10*3/uL (ref 0.1–1.0)
Monocytes Relative: 11 %
Neutro Abs: 2.4 10*3/uL (ref 1.7–7.7)
Neutrophils Relative %: 48 %
Platelets: 163 10*3/uL (ref 150–400)
RBC: 5.04 MIL/uL (ref 3.87–5.11)
RDW: 14.1 % (ref 11.5–15.5)
WBC: 5 10*3/uL (ref 4.0–10.5)
nRBC: 0 % (ref 0.0–0.2)

## 2023-09-11 MED ORDER — LEVETIRACETAM (KEPPRA) 500 MG/5 ML ADULT IV PUSH
1500.0000 mg | Freq: Once | INTRAVENOUS | Status: AC
  Administered 2023-09-11: 1500 mg via INTRAVENOUS
  Filled 2023-09-11: qty 15

## 2023-09-11 NOTE — ED Triage Notes (Signed)
 PT BIB EMS coming form daughter's home. States they found patient in the bathroom floor having a seizure. Pt was given intranasal medasiline.at 2156. Family state that the patient has been more lethargic this past month. Last seizure a month ago.  BP 158/72, Hr 60s, 97%. CBG 106

## 2023-09-11 NOTE — ED Provider Notes (Signed)
 [place] EMERGENCY DEPARTMENT AT [facility] Provider Note   CSN: [id number] Arrival date & time: 09/11/23  2309     Patient presents with: Seizures   Alicia Brennan is a 73 y.o. female.   Patient with a history of seizure disorder, IBS, hypertension, sarcoidosis here after apparent seizure.  She is postictal and unable to give a history at this time.  Denies any pain.  Denies hitting her head.  States compliance with her seizure medications.  Denies any headache, chest pain, shortness of breath, nausea, vomiting, cough or fever.  No focal weakness, numbness or tingling.  No visual changes.  Denies any recent alcohol or drug use.  No family at bedside.  EMS reports, patient was found on the bathroom floor having a seizure by her family members.  She did receive intranasal midazolam .  Family states patient has had increased weakness over the past 1 month.  Last seizure about a month ago.  - Depakote  ER 250 mg take 3 tabs nightly. Increase by one tab today. Last dose was last night at 9pm. - Continue Keppra  10ml (1000mg  ) BID    The history is provided by the patient and the EMS personnel. The history is limited by the condition of the patient.  Seizures      Prior to Admission medications   Medication Sig Start Date End Date Taking? Authorizing Provider  amLODipine  (NORVASC ) 10 MG tablet Take 10 mg by mouth daily.    [provider]  atenolol-chlorthalidone (TENORETIC) 50-25 MG tablet Take 1 tablet by mouth daily.    [provider]  divalproex  (DEPAKOTE ) 250 MG DR tablet Take 1 tablet (250 mg total) by mouth 3 (three) times daily. 03/25/18   Harris, Abigail, PA-C  docusate sodium  (COLACE) 100 MG capsule Take 1 capsule (100 mg total) by mouth 2 (two) times daily. 11/16/15   Swinteck, Polly Brink, MD  Ferrous Sulfate (IRON PO) Take 1 tablet by mouth daily. 12/16/21   [provider]  FLUoxetine (PROZAC) 20 MG/5ML solution Take 10 mg by mouth daily.  04/11/22 04/11/23  [provider]  levETIRAcetam  (KEPPRA ) 100 MG/ML solution Take 1,000 mg by mouth 2 (two) times daily.    [provider]  loperamide  (IMODIUM  A-D) 2 MG tablet Take 2 mg by mouth daily.    [provider]  losartan -hydrochlorothiazide (HYZAAR) 50-12.5 MG tablet Take 1 tablet by mouth daily.    [provider]  potassium chloride  (KLOR-CON ) 10 MEQ tablet Take 2 tablets (20 mEq total) by mouth daily for 3 days. 11/27/21 11/30/21  Branham, Victoria C, MD  senna (SENOKOT) 8.6 MG TABS tablet Take 2 tablets (17.2 mg total) by mouth at bedtime. 11/16/15   Swinteck, Polly Brink, MD    Allergies: No known allergies    Review of Systems  Unable to perform ROS: Mental status change  Neurological:  Positive for seizures.    Updated Vital Signs BP 121/64   Pulse 60   Temp (S) (!) 96.9 F (36.1 C) (Rectal)   Resp 14   SpO2 96%   Physical Exam Vitals and nursing note reviewed.  Constitutional:      General: She is not in acute distress.    Appearance: She is well-developed.     Comments: Oriented to person only.  HENT:     Head: Normocephalic and atraumatic.     Mouth/Throat:     Pharynx: No oropharyngeal exudate.   Eyes:     Conjunctiva/sclera: Conjunctivae normal.  Pupils: Pupils are equal, round, and reactive to light.   Neck:     Comments: No midline C-spine tenderness Cardiovascular:     Rate and Rhythm: Normal rate and regular rhythm.     Heart sounds: Normal heart sounds. No murmur heard. Pulmonary:     Effort: Pulmonary effort is normal. No respiratory distress.     Breath sounds: Normal breath sounds.  Abdominal:     Palpations: Abdomen is soft.     Tenderness: There is no abdominal tenderness. There is no guarding or rebound.   Musculoskeletal:        General: No tenderness. Normal range of motion.     Cervical back: Normal range of motion and neck supple.   Skin:    General: Skin is warm.   Neurological:     Mental  Status: She is alert.     Cranial Nerves: No cranial nerve deficit.     Motor: No abnormal muscle tone.     Coordination: Coordination normal.     Comments: No ataxia on finger to nose bilaterally. No pronator drift. 5/5 strength throughout. CN 2-12 intact.Equal grip strength. Sensation intact.   Psychiatric:        Behavior: Behavior normal.     (all labs ordered are listed, but only abnormal results are displayed) Labs Reviewed  CBC WITH DIFFERENTIAL/PLATELET - Abnormal; Notable for the following components:      Result Value   Hemoglobin 15.3 (*)    HCT 47.8 (*)    All other components within normal limits  COMPREHENSIVE METABOLIC PANEL WITH GFR - Abnormal; Notable for the following components:   Glucose, Bld 100 (*)    Calcium 8.7 (*)    Albumin  3.1 (*)    AST 53 (*)    All other components within normal limits  CULTURE, BLOOD (ROUTINE X 2)  CULTURE, BLOOD (ROUTINE X 2)  ETHANOL  VALPROIC ACID  LEVEL  CK  MAGNESIUM  BLOOD GAS, VENOUS  URINALYSIS, ROUTINE W REFLEX MICROSCOPIC  LEVETIRACETAM  LEVEL  LACTIC ACID, PLASMA  LACTIC ACID, PLASMA  TROPONIN I (HIGH SENSITIVITY)    EKG: EKG Interpretation Date/Time:  Tuesday September 11 2023 23:41:54 EDT Ventricular Rate:  59 PR Interval:  196 QRS Duration:  96 QT Interval:  457 QTC Calculation: 453 R Axis:   37  Text Interpretation: Sinus rhythm Probable left ventricular hypertrophy Borderline T abnormalities, inferior leads No significant change was found Confirmed by Earma Gloss [phone]) on 09/12/2023 12:09:46 AM  Radiology: CT Head Wo Contrast Result Date: 09/12/2023 CLINICAL DATA:  Status post seizure. EXAM: CT HEAD WITHOUT CONTRAST TECHNIQUE: Contiguous axial images were obtained from the base of the skull through the vertex without intravenous contrast. RADIATION DOSE REDUCTION: This exam was performed according to the departmental dose-optimization program which includes automated exposure control, adjustment of the  mA and/or kV according to patient size and/or use of iterative reconstruction technique. COMPARISON:  November 27, 2021 FINDINGS: Brain: There is generalized cerebral atrophy with widening of the extra-axial spaces and ventricular dilatation. There are areas of decreased attenuation within the white matter tracts of the supratentorial brain, consistent with microvascular disease changes. Vascular: No hyperdense vessel or unexpected calcification. Skull: Normal. Negative for fracture or focal lesion. Sinuses/Orbits: No acute finding. Other: None. IMPRESSION: 1. Generalized cerebral atrophy and microvascular disease changes of the supratentorial brain. 2. No acute intracranial abnormality. Electronically Signed   By: Virgle Grime M.D.   On: 09/12/2023 00:19     .Critical Care  Performed by: Earma Gloss, MD Authorized by: Earma Gloss, MD   Critical care provider statement:    Critical care time (minutes):  45   Critical care time was exclusive of:  Separately billable procedures and treating other patients   Critical care was necessary to treat or prevent imminent or life-threatening deterioration of the following conditions:  CNS failure or compromise   Critical care was time spent personally by me on the following activities:  Development of treatment plan with patient or surrogate, discussions with consultants, evaluation of patient's response to treatment, examination of patient, ordering and review of laboratory studies, ordering and review of radiographic studies, ordering and performing treatments and interventions, pulse oximetry, re-evaluation of patient's condition, review of old charts, blood draw for specimens and obtaining history from patient or surrogate   I assumed direction of critical care for this patient from another provider in my specialty: no     Care discussed with: admitting provider      Medications Ordered in the ED  levETIRAcetam  (KEPPRA ) undiluted injection  1,500 mg (has no administration in time range)                                    Medical Decision Making Amount and/or Complexity of Data Reviewed Labs: ordered. Decision-making details documented in ED Course. Radiology: ordered and independent interpretation performed. Decision-making details documented in ED Course. ECG/medicine tests: ordered and independent interpretation performed. Decision-making details documented in ED Course.  Risk Decision regarding hospitalization.   Seizure-like activity.  No head trauma.  Vitals are stable on arrival she is protecting her airway.  Will load with Keppra .  Will check labs including Depakote  level and magnesium level and CK.  Obtain CT head.  Discussed with neurology Dr. Cleone Dad.  Depakote  level is therapeutic.  Recommends adding Vimpat  100 mg now and 50 mg twice daily.  May require admission if her mental status is not quickly improved.  Do not suspect ongoing seizure activity.  Recommends continuing Keppra  1000 g twice daily, Depakote  750 mg daily.  Add Vimpat  as above.  Check CK, magnesium.  Recommend admission to [facility] if not improving.  Labs as above.  Depakote  level is therapeutic.  Will check ammonia as well as magnesium and CK.  CT head negative for acute traumatic findings.  Patient remains obtunded and somnolent status post Versed  for protecting her airway.  No respiratory distress.  No reported ongoing seizure-like activity.  Given her obtunded state we will plan overnight observation.  Admission discussed with Dr. Jeannene Milling for transfer to [facility].  Vimpat  dose ordered as well as IV Keppra  ordered.  Neurology made aware of patient.''  Chest x-ray concerning for multifocal pneumonia and antibiotics initiated.  No hypoxia or increased work of breathing.    Final diagnoses:  Seizure disorder [facility])  Acute encephalopathy    ED Discharge Orders     None          Roshanda Balazs, Mara [place], MD 09/12/23 [phone]

## 2023-09-12 ENCOUNTER — Other Ambulatory Visit: Payer: Self-pay

## 2023-09-12 ENCOUNTER — Inpatient Hospital Stay (HOSPITAL_COMMUNITY)

## 2023-09-12 ENCOUNTER — Emergency Department (HOSPITAL_COMMUNITY)

## 2023-09-12 ENCOUNTER — Encounter (HOSPITAL_COMMUNITY): Payer: Self-pay | Admitting: Emergency Medicine

## 2023-09-12 DIAGNOSIS — R569 Unspecified convulsions: Secondary | ICD-10-CM | POA: Diagnosis not present

## 2023-09-12 DIAGNOSIS — Z79899 Other long term (current) drug therapy: Secondary | ICD-10-CM | POA: Diagnosis not present

## 2023-09-12 DIAGNOSIS — I10 Essential (primary) hypertension: Secondary | ICD-10-CM | POA: Diagnosis not present

## 2023-09-12 DIAGNOSIS — Z85048 Personal history of other malignant neoplasm of rectum, rectosigmoid junction, and anus: Secondary | ICD-10-CM | POA: Diagnosis not present

## 2023-09-12 DIAGNOSIS — D860 Sarcoidosis of lung: Secondary | ICD-10-CM | POA: Diagnosis not present

## 2023-09-12 DIAGNOSIS — R600 Localized edema: Secondary | ICD-10-CM | POA: Diagnosis present

## 2023-09-12 DIAGNOSIS — G40909 Epilepsy, unspecified, not intractable, without status epilepticus: Secondary | ICD-10-CM | POA: Diagnosis not present

## 2023-09-12 DIAGNOSIS — G9340 Encephalopathy, unspecified: Secondary | ICD-10-CM | POA: Diagnosis not present

## 2023-09-12 DIAGNOSIS — R918 Other nonspecific abnormal finding of lung field: Secondary | ICD-10-CM | POA: Diagnosis not present

## 2023-09-12 DIAGNOSIS — R2689 Other abnormalities of gait and mobility: Secondary | ICD-10-CM | POA: Diagnosis not present

## 2023-09-12 LAB — CK: Total CK: 177 U/L (ref 38–234)

## 2023-09-12 LAB — RESPIRATORY PANEL BY PCR

## 2023-09-12 LAB — LACTIC ACID, PLASMA
Lactic Acid, Venous: 1.2 mmol/L (ref 0.5–1.9)
Lactic Acid, Venous: 1.2 mmol/L (ref 0.5–1.9)

## 2023-09-12 LAB — BRAIN NATRIURETIC PEPTIDE: B Natriuretic Peptide: 45.2 pg/mL (ref 0.0–100.0)

## 2023-09-12 LAB — COMPREHENSIVE METABOLIC PANEL WITH GFR
ALT: 32 U/L (ref 0–44)
AST: 53 U/L — ABNORMAL HIGH (ref 15–41)
Albumin: 3.1 g/dL — ABNORMAL LOW (ref 3.5–5.0)
Alkaline Phosphatase: 88 U/L (ref 38–126)
Anion gap: 10 (ref 5–15)
BUN: 11 mg/dL (ref 8–23)
CO2: 22 mmol/L (ref 22–32)
Calcium: 8.7 mg/dL — ABNORMAL LOW (ref 8.9–10.3)
Chloride: 105 mmol/L (ref 98–111)
Creatinine, Ser: 0.88 mg/dL (ref 0.44–1.00)
GFR, Estimated: >60 mL/min (ref >60)
Glucose, Bld: 100 mg/dL — ABNORMAL HIGH (ref 70–99)
Potassium: 4.2 mmol/L (ref 3.5–5.1)
Sodium: 137 mmol/L (ref 135–145)
Total Bilirubin: 1.2 mg/dL (ref 0.0–1.2)
Total Protein: 7.5 g/dL (ref 6.5–8.1)

## 2023-09-12 LAB — CREATININE, SERUM
Creatinine, Ser: 0.76 mg/dL (ref 0.44–1.00)
GFR, Estimated: >60 mL/min (ref >60)

## 2023-09-12 LAB — VALPROIC ACID LEVEL: Valproic Acid Lvl: 55 ug/mL (ref 50–100)

## 2023-09-12 LAB — CBC
HCT: 46.7 % — ABNORMAL HIGH (ref 36.0–46.0)
Hemoglobin: 15.1 g/dL — ABNORMAL HIGH (ref 12.0–15.0)
MCH: 30 pg (ref 26.0–34.0)
MCHC: 32.3 g/dL (ref 30.0–36.0)
MCV: 92.8 fL (ref 80.0–100.0)
Platelets: 162 10*3/uL (ref 150–400)
RBC: 5.03 MIL/uL (ref 3.87–5.11)
RDW: 14 % (ref 11.5–15.5)
WBC: 4 10*3/uL (ref 4.0–10.5)
nRBC: 0 % (ref 0.0–0.2)

## 2023-09-12 LAB — ETHANOL: Alcohol, Ethyl (B): <15 mg/dL (ref <15)

## 2023-09-12 LAB — MAGNESIUM: Magnesium: 2.1 mg/dL (ref 1.7–2.4)

## 2023-09-12 LAB — URINALYSIS, ROUTINE W REFLEX MICROSCOPIC
Bilirubin Urine: NEGATIVE
Glucose, UA: NEGATIVE mg/dL
Hgb urine dipstick: NEGATIVE
Ketones, ur: NEGATIVE mg/dL
Leukocytes,Ua: NEGATIVE
Nitrite: NEGATIVE
Protein, ur: NEGATIVE mg/dL
Specific Gravity, Urine: 1.013 (ref 1.005–1.030)
pH: 6 (ref 5.0–8.0)

## 2023-09-12 LAB — BLOOD GAS, VENOUS
Acid-Base Excess: 0.5 mmol/L (ref 0.0–2.0)
Bicarbonate: 27 mmol/L (ref 20.0–28.0)
O2 Saturation: 70.8 %
Patient temperature: 37
pCO2, Ven: 50 mmHg (ref 44–60)
pH, Ven: 7.34 (ref 7.25–7.43)
pO2, Ven: 43 mmHg (ref 32–45)

## 2023-09-12 LAB — TROPONIN I (HIGH SENSITIVITY): Troponin I (High Sensitivity): 4 ng/L (ref <18)

## 2023-09-12 LAB — PROCALCITONIN: Procalcitonin: <0.1 ng/mL

## 2023-09-12 MED ORDER — LACOSAMIDE 50 MG PO TABS
50.0000 mg | ORAL_TABLET | Freq: Two times a day (BID) | ORAL | Status: DC
  Administered 2023-09-13: 50 mg via ORAL
  Filled 2023-09-12: qty 1

## 2023-09-12 MED ORDER — OXYCODONE HCL 5 MG PO TABS: 5.0000 mg | ORAL_TABLET | ORAL | Status: DC | PRN

## 2023-09-12 MED ORDER — LEVETIRACETAM 100 MG/ML PO SOLN
1000.0000 mg | Freq: Two times a day (BID) | ORAL | Status: DC
  Administered 2023-09-12 – 2023-09-13 (×3): 1000 mg via ORAL
  Filled 2023-09-12 (×3): qty 10

## 2023-09-12 MED ORDER — ENOXAPARIN SODIUM 40 MG/0.4ML IJ SOSY
40.0000 mg | PREFILLED_SYRINGE | INTRAMUSCULAR | Status: DC
  Administered 2023-09-12 – 2023-09-13 (×2): 40 mg via SUBCUTANEOUS
  Filled 2023-09-12 (×2): qty 0.4

## 2023-09-12 MED ORDER — ACETAMINOPHEN 650 MG RE SUPP: 650.0000 mg | Freq: Four times a day (QID) | RECTAL | Status: DC | PRN

## 2023-09-12 MED ORDER — SODIUM CHLORIDE 0.9 % IV SOLN
100.0000 mg | Freq: Once | INTRAVENOUS | Status: AC
  Administered 2023-09-12: 100 mg via INTRAVENOUS
  Filled 2023-09-12 (×2): qty 10

## 2023-09-12 MED ORDER — SODIUM CHLORIDE 0.9 % IV SOLN
1.0000 g | Freq: Once | INTRAVENOUS | Status: AC
  Administered 2023-09-12: 1 g via INTRAVENOUS
  Filled 2023-09-12: qty 10

## 2023-09-12 MED ORDER — ONDANSETRON HCL 4 MG/2ML IJ SOLN: 4.0000 mg | Freq: Four times a day (QID) | INTRAMUSCULAR | Status: DC | PRN

## 2023-09-12 MED ORDER — ENOXAPARIN SODIUM 40 MG/0.4ML IJ SOSY: 40.0000 mg | PREFILLED_SYRINGE | INTRAMUSCULAR | Status: DC

## 2023-09-12 MED ORDER — ONDANSETRON HCL 4 MG PO TABS: 4.0000 mg | ORAL_TABLET | Freq: Four times a day (QID) | ORAL | Status: DC | PRN

## 2023-09-12 MED ORDER — IPRATROPIUM-ALBUTEROL 0.5-2.5 (3) MG/3ML IN SOLN: 3.0000 mL | Freq: Four times a day (QID) | RESPIRATORY_TRACT | Status: DC | PRN

## 2023-09-12 MED ORDER — DIVALPROEX SODIUM 250 MG PO DR TAB
750.0000 mg | DELAYED_RELEASE_TABLET | Freq: Every day | ORAL | Status: DC
  Administered 2023-09-12: 750 mg via ORAL
  Filled 2023-09-12: qty 3

## 2023-09-12 MED ORDER — SODIUM CHLORIDE 0.9 % IV SOLN
500.0000 mg | Freq: Once | INTRAVENOUS | Status: AC
  Administered 2023-09-12: 500 mg via INTRAVENOUS
  Filled 2023-09-12: qty 5

## 2023-09-12 MED ORDER — DOCUSATE SODIUM 100 MG PO CAPS
100.0000 mg | ORAL_CAPSULE | Freq: Two times a day (BID) | ORAL | Status: DC
  Administered 2023-09-12 – 2023-09-13 (×2): 100 mg via ORAL
  Filled 2023-09-12 (×2): qty 1

## 2023-09-12 MED ORDER — FLUOXETINE HCL 20 MG/5ML PO SOLN
10.0000 mg | Freq: Every day | ORAL | Status: DC
  Administered 2023-09-12: 10 mg via ORAL
  Filled 2023-09-12 (×2): qty 5

## 2023-09-12 MED ORDER — ACETAMINOPHEN 325 MG PO TABS: 650.0000 mg | ORAL_TABLET | Freq: Four times a day (QID) | ORAL | Status: DC | PRN

## 2023-09-12 MED ORDER — IRBESARTAN 150 MG PO TABS
150.0000 mg | ORAL_TABLET | Freq: Every day | ORAL | Status: DC
  Administered 2023-09-12 – 2023-09-13 (×2): 150 mg via ORAL
  Filled 2023-09-12 (×2): qty 1

## 2023-09-12 MED ORDER — DOCUSATE SODIUM 100 MG PO CAPS
100.0000 mg | ORAL_CAPSULE | Freq: Two times a day (BID) | ORAL | Status: DC
  Administered 2023-09-12: 100 mg via ORAL
  Filled 2023-09-12: qty 1

## 2023-09-12 NOTE — Progress Notes (Signed)
 PT Cancellation Note  Patient Details Name: Alicia Brennan MRN: [id number] DOB: October 22, 1950   Cancelled Treatment:    Reason Eval/Treat Not Completed: Patient at procedure or test/unavailable; down for MRI.  Will follow up.   Marley Simmers 09/12/2023, 2:40 PM Abigail Hoff, PT Acute Rehabilitation Services Office:[phone] 09/12/2023

## 2023-09-12 NOTE — Progress Notes (Signed)
Patient is off the unit for MRI 

## 2023-09-12 NOTE — Progress Notes (Signed)
 EEG complete - results pending

## 2023-09-12 NOTE — ED Notes (Signed)
 Pt currently awake and alert, A&Ox4, denies any pain or complaints at this time.

## 2023-09-12 NOTE — Progress Notes (Signed)
 Patient received from [facility] ED, alert and oriented X4, a little bit of abdominal distention/ bloating and tenderness, tele box connected, call bell within reach, bed in lowest position, will continue to monitor

## 2023-09-12 NOTE — Plan of Care (Signed)
  Problem: Education: Goal: Knowledge of General Education information will improve Description: Including pain rating scale, medication(s)/side effects and non-pharmacologic comfort measures Outcome: Progressing   Problem: Clinical Measurements: Goal: Respiratory complications will improve Outcome: Progressing Goal: Cardiovascular complication will be avoided Outcome: Progressing   Problem: Activity: Goal: Risk for activity intolerance will decrease Outcome: Progressing   Problem: Nutrition: Goal: Adequate nutrition will be maintained Outcome: Progressing   Problem: Elimination: Goal: Will not experience complications related to bowel motility Outcome: Progressing Goal: Will not experience complications related to urinary retention Outcome: Progressing   Problem: Skin Integrity: Goal: Risk for impaired skin integrity will decrease Outcome: Progressing

## 2023-09-12 NOTE — ED Notes (Signed)
 [place] PIV placed.

## 2023-09-12 NOTE — H&P (Addendum)
 History and Physical    Patient: Alicia Brennan NFA:[id number] DOB: [birth year] DOA: 09/11/2023 DOS: the patient was seen and examined on 09/12/2023 PCP: Arzella Laurence, MD  Patient coming from: Home  Chief Complaint:  Chief Complaint  Patient presents with   Seizures   HPI: Alicia Brennan is a 73 y.o. female with medical history significant of pulmonary sarcoidosis, IBS, HTN, and seizures p/w recurrent seziure.  Pt is unable to provide a complete medical history. She mentions that she was visiting her daughter and had a seizure sometime that evening, and did not come out of it quickly so that administered a medication up her nose and acitvated EMS. Pt reports that she has about 2-3 absence sz/month and that they normally self-abate. She received her neurologic medical care in [place] at [facility], but is interested in finding a local neurologist. She denies any sick contacts or recent travel.  In the ED, pt presented with hypothermia (96.9 rectal temp), bradycardia and hypertension. Labs fairly unremarkable (WBC 5 and lactate 1.2). CTH NAICA. CXR c/f multifocal PNA. Pt received IV CTX/azithromycin and IV keppra /vimapt in ED and admitted to medicine with Neurology eval pending.  Review of Systems: As mentioned in the history of present illness. All other systems reviewed and are negative. 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 She has never used smokeless tobacco. She reports that she does not drink alcohol and does not use drugs.  Allergies  Allergen Reactions   No Known Allergies     Family History  Problem Relation Age of Onset   Dementia Mother    High blood pressure Father    Dementia Maternal Aunt    Sarcoidosis Son    Seizures Neg Hx     Prior to Admission medications   Medication Sig Start Date End Date Taking? Authorizing Provider  sulfamethoxazole-trimethoprim (BACTRIM DS) 800-160 MG tablet Take 1 tablet by mouth 2 (two) times daily. 07/18/23  Yes [provider]  telmisartan (MICARDIS) 80 MG tablet Take 80 mg by mouth daily. 12/04/22  Yes [provider]  amLODipine  (NORVASC ) 10 MG tablet Take 10 mg by mouth daily.    [provider]  atenolol-chlorthalidone (TENORETIC) 50-25 MG tablet Take 1 tablet by mouth daily.    [provider]  divalproex  (DEPAKOTE ) 250 MG DR tablet Take 1 tablet (250 mg total) by mouth 3 (three) times daily. 03/25/18   Harris, Abigail, PA-C  docusate sodium  (COLACE) 100 MG capsule Take 1 capsule (100 mg total) by mouth 2 (two) times daily. 11/16/15   Swinteck, Polly Brink, MD  Ferrous Sulfate (IRON PO) Take 1 tablet by mouth daily. 12/16/21  [provider]  FLUoxetine (PROZAC) 20 MG/5ML solution Take 10 mg by mouth daily. 04/11/22 04/11/23  [provider]  levETIRAcetam  (KEPPRA ) 100 MG/ML solution Take 1,000 mg by mouth 2 (two) times daily.    [provider]  loperamide  (IMODIUM  A-D) 2 MG tablet Take 2 mg by mouth daily.    [provider]  losartan -hydrochlorothiazide (HYZAAR) 50-12.5 MG tablet Take 1 tablet by mouth daily.    [provider]  potassium chloride  (KLOR-CON ) 10 MEQ tablet Take 2 tablets (20 mEq total) by mouth daily for 3 days. 11/27/21 11/30/21   Branham, Victoria C, MD  senna (SENOKOT) 8.6 MG TABS tablet Take 2 tablets (17.2 mg total) by mouth at bedtime. 11/16/15   Adonica Hoose, MD    Physical Exam: Vitals:   09/12/23 0525 09/12/23 0630 09/12/23 0912 09/12/23 1115  BP:  (!) 144/68 (!) 155/78 (!) 159/94  Pulse:  67 (!) 59 66  Resp:  14 18 18   Temp: (!) 97.4 F (36.3 C)  98.1 F (36.7 C) (!) 97.5 F (36.4 C)  TempSrc: Oral  Oral Oral  SpO2:  100% 99% 100%   General: Alert, oriented x3, resting comfortably in no acute distress Respiratory: Lungs clear to auscultation bilaterally with normal respiratory effort; no w/r/r Cardiovascular: Regular rate and rhythm w/o m/r/g   Data Reviewed:  Lab Results  Component Value Date   WBC 5.0 09/11/2023   HGB 15.3 (H) 09/11/2023   HCT 47.8 (H) 09/11/2023   MCV 94.8 09/11/2023   PLT 163 09/11/2023   Lab Results  Component Value Date   GLUCOSE 100 (H) 09/11/2023   CALCIUM 8.7 (L) 09/11/2023   NA 137 09/11/2023   K 4.2 09/11/2023   CO2 22 09/11/2023   CL 105 09/11/2023   BUN 11 09/11/2023   CREATININE 0.88 09/11/2023   Lab Results  Component Value Date   ALT 32 09/11/2023   AST 53 (H) 09/11/2023   ALKPHOS 88 09/11/2023   BILITOT 1.2 09/11/2023   Lab Results  Component Value Date   INR 1.1 11/27/2021    Radiology: DG Chest Portable 1 View Result Date: 09/12/2023 EXAM: 1 VIEW XRAY OF THE CHEST 09/12/2023 02:40:00 AM COMPARISON: CT chest dated 08/06/2015. CLINICAL HISTORY: AMS. Seizures, altered mental status. FINDINGS: LUNGS AND PLEURA: Increased interstitial markings with mild lower lobe predominance, favoring multifocal pneumonia over interstitial edema. No pleural effusion. No pneumothorax. HEART AND MEDIASTINUM: No acute abnormality of the cardiac and mediastinal silhouettes. BONES AND SOFT TISSUES: No acute osseous abnormality. IMPRESSION: 1. Increased interstitial markings with mild lower lobe predominance, favoring multifocal pneumonia over interstitial edema.  Electronically signed by: Zadie Herter MD 09/12/2023 02:45 AM EDT RP Workstation: [id number]   CT Head Wo Contrast Result Date: 09/12/2023 CLINICAL DATA:  Status post seizure. EXAM: CT HEAD WITHOUT CONTRAST TECHNIQUE: Contiguous axial images were obtained from the base of the skull through the vertex without intravenous contrast. RADIATION DOSE REDUCTION: This exam was performed according to the departmental dose-optimization program which includes automated exposure control, adjustment of the mA and/or kV according to patient size and/or use of iterative reconstruction technique. COMPARISON:  November 27, 2021 FINDINGS: Brain: There is generalized cerebral atrophy with widening of the extra-axial spaces and ventricular dilatation. There are areas of decreased attenuation within the white matter tracts of the supratentorial brain, consistent with microvascular disease changes. Vascular: No hyperdense vessel or unexpected calcification. Skull: Normal. Negative for fracture or focal lesion. Sinuses/Orbits: No acute finding. Other: None. IMPRESSION: 1.  Generalized cerebral atrophy and microvascular disease changes of the supratentorial brain. 2. No acute intracranial abnormality. Electronically Signed   By: Virgle Grime M.D.   On: 09/12/2023 00:19    Assessment and Plan: 26F h/o pulmonary sarcoidosis, IBS, HTN, and seizures p/w recurrent seziure.  Seizure -Neurology following; recs: PTA keppra  and depakote ; add vimpat  50mg  BID -Continue keppra  1g BID, depakote  750mg  at bedtime and vimapt 50mg  BID -F/u EEG -F/u MRI brain WO  Abnl CXR Presumed pulmonary sarcoidosis given lack of respiratory sx per pt -F/u CT chest -F/u procalcitonin -F/u RVP  HTN Unclear regimen as med rec has not been complete -HOLD pta amlodipine  10mg  daily per below -Irbesartan 150mg  daily ( in lieu of pta telmisrtan 80mg  every day which is equal to irbesartan 300mg  daily, but halved dose on admission; consder pta  dosing tomorrow and at discharge)  LE swelling -PT/OT consulted; apprec eval -Possible related to high dose amlodipine  consider reducing this medication or d/c altogether (awaiting med rec) -F/u TTE and BNP and consider GDMT and lasix prn   Advance Care Planning:   Code Status: Full Code   Consults: Neurology  Family Communication: N/A  Severity of Illness: The appropriate patient status for this patient is INPATIENT. Inpatient status is judged to be reasonable and necessary in order to provide the required intensity of service to ensure the patient's safety. The patient's presenting symptoms, physical exam findings, and initial radiographic and laboratory data in the context of their chronic comorbidities is felt to place them at high risk for further clinical deterioration. Furthermore, it is not anticipated that the patient will be medically stable for discharge from the hospital within 2 midnights of admission.   * I certify that at the point of admission it is my clinical judgment that the patient will require inpatient hospital care spanning beyond 2 midnights from the point of admission due to high intensity of service, high risk for further deterioration and high frequency of surveillance required.*   ------- I spent 55 minutes reviewing previous labs/notes, obtaining separate history at the bedside, counseling/discussing the treatment plan outlined above, ordering medications/tests, and performing clinical documentation.  Author: Arne Langdon, MD 09/12/2023 11:26 AM  For on call review [email or web address].

## 2023-09-12 NOTE — ED Notes (Signed)
 [facility] called.

## 2023-09-12 NOTE — Progress Notes (Signed)
 Patient is back to the unit.

## 2023-09-12 NOTE — Procedures (Signed)
 Patient Name: Alicia Brennan  MRN: [id number]  Epilepsy Attending: Arleene Lack  Referring Physician/Provider: Arne Langdon, MD  Date: 09/12/2023 Duration: 23.49 mins  Patient history: 73 y.o. female with medical history significant of pulmonary sarcoidosis, IBS, HTN, and seizures p/w recurrent seziure. EEG to evaluate for seizure.  Level of alertness: Awake  AEDs during EEG study: VPA, LEV, LCM  Technical aspects: This EEG study was done with scalp electrodes positioned according to the 10-20 International system of electrode placement. Electrical activity was reviewed with band pass filter of 1-70Hz , sensitivity of 7 uV/mm, display speed of 64mm/sec with a 60Hz  notched filter applied as appropriate. EEG data were recorded continuously and digitally stored.  Video monitoring was available and reviewed as appropriate.  Description: The posterior dominant rhythm consists of 9 Hz activity of moderate voltage (25-35 uV) seen predominantly in posterior head regions, symmetric and reactive to eye opening and eye closing.  Physiologic photic driving was not seen during photic stimulation.  Hyperventilation was not performed.     IMPRESSION: This study is within normal limits. No seizures or epileptiform discharges were seen throughout the recording.  A normal interictal EEG does not exclude the diagnosis of epilepsy.   Parissa Chiao O Marielle Mantione

## 2023-09-12 NOTE — ED Notes (Signed)
 ED TO INPATIENT HANDOFF REPORT  Name/Age/Gender Alicia Brennan 73 y.o. female  Code Status    Code Status Orders  (From admission, onward)           Start     Ordered   09/12/23 0357  Full code  Continuous       Question:  By:  Answer:  Consent: discussion documented in EHR   09/12/23 0356           Code Status History     Date Active Date Inactive Code Status Order ID Comments User Context   11/15/2015 1424 11/18/2015 2146 Full Code [id number]  Adonica Hoose, MD Inpatient   04/25/2015 1158 04/26/2015 1921 Full Code [id number]  Luevenia Saha, MD ED       Home/SNF/Other Home  Chief Complaint Seizure [facility]) [R56.9]  Level of Care/Admitting Diagnosis ED Disposition     ED Disposition  Admit   Condition  --   Comment  Hospital Area: [facility] [[id number]]  Level of Care: Telemetry Medical [104]  May admit patient to Arlin Benes or Maryan Smalling if equivalent level of care is available:: No  Covid Evaluation: Asymptomatic - no recent exposure (last 10 days) testing not required  Diagnosis: Seizure [facility]) [[id number]]  Admitting Physician: Myrl Askew [[id number]]  Attending Physician: Myrl Askew [[id number]]  Certification:: I certify this patient will need inpatient services for at least 2 midnights  Expected Medical Readiness: 09/14/2023          Medical History 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   Hypertension    IBS (irritable bowel syndrome)    IBS (irritable bowel syndrome)    Leg fracture, left    Recurrent headache    Sarcoidosis of lung (HCC)    Seizures (HCC)     Allergies Allergies  Allergen Reactions   No Known Allergies     IV Location/Drains/Wounds Patient Lines/Drains/Airways Status     Active Line/Drains/Airways     Name Placement date Placement time Site Days   Peripheral IV 09/11/23 20 G Left;Posterior Forearm 09/11/23  2300  Forearm  1             Labs/Imaging Results for orders placed or performed during the hospital encounter of 09/11/23 (from the past 48 hours)  CBC with Differential     Status: Abnormal   Collection Time: 09/11/23 11:35 PM  Result Value Ref Range   WBC 5.0 4.0 - 10.5 K/uL   RBC 5.04 3.87 - 5.11 MIL/uL   Hemoglobin 15.3 (H) 12.0 - 15.0 g/dL   HCT 51.8 (H) 84.1 - 66.0 %   MCV 94.8 80.0 - 100.0 fL   MCH 30.4 26.0 - 34.0 pg   MCHC 32.0 30.0 - 36.0 g/dL   RDW 63.0 16.0 - 10.9 %   Platelets 163 150 - 400 K/uL   nRBC 0.0 0.0 - 0.2 %   Neutrophils Relative % 48 %   Neutro Abs 2.4 1.7 - 7.7 K/uL   Lymphocytes Relative 39 %   Lymphs Abs 1.9 0.7 - 4.0 K/uL   Monocytes Relative 11 %   Monocytes Absolute 0.5 0.1 - 1.0 K/uL   Eosinophils Relative 2 %   Eosinophils Absolute 0.1 0.0 - 0.5 K/uL   Basophils Relative 0 %   Basophils Absolute 0.0 0.0 - 0.1 K/uL   Immature Granulocytes 0 %   Abs Immature Granulocytes 0.01 0.00 - 0.07 K/uL  Comment: Performed at [facility], [address], [place], [place] [address]  Comprehensive metabolic panel     Status: Abnormal   Collection Time: 09/11/23 11:35 PM  Result Value Ref Range   Sodium 137 135 - 145 mmol/L   Potassium 4.2 3.5 - 5.1 mmol/L   Chloride 105 98 - 111 mmol/L   CO2 22 22 - 32 mmol/L   Glucose, Bld 100 (H) 70 - 99 mg/dL    Comment: Glucose reference range applies only to samples taken after fasting for at least 8 hours.   BUN 11 8 - 23 mg/dL   Creatinine, Ser 2.13 0.44 - 1.00 mg/dL   Calcium 8.7 (L) 8.9 - 10.3 mg/dL   Total Protein 7.5 6.5 - 8.1 g/dL   Albumin  3.1 (L) 3.5 - 5.0 g/dL   AST 53 (H) 15 - 41 U/L   ALT 32 0 - 44 U/L   Alkaline Phosphatase 88 38 - 126 U/L   Total Bilirubin 1.2 0.0 - 1.2 mg/dL   GFR, Estimated >08 >65 mL/min    Comment: (NOTE) Calculated using the CKD-EPI Creatinine Equation (2021)    Anion gap 10 5 - 15    Comment: Performed at [facility], [address] [place], [place]  [address]  Ethanol     Status: None   Collection Time: 09/11/23 11:35 PM  Result Value Ref Range   Alcohol, Ethyl (B) <15 <15 mg/dL    Comment: (NOTE) For medical purposes only. Performed at [facility], [address] [place], [place] [address]   Troponin I (High Sensitivity)     Status: None   Collection Time: 09/11/23 11:35 PM  Result Value Ref Range   Troponin I (High Sensitivity) 4 <18 ng/L    Comment: (NOTE) Elevated high sensitivity troponin I (hsTnI) values and significant  changes across serial measurements may suggest ACS but many other  chronic and acute conditions are known to elevate hsTnI results.  Refer to the Links section for chest pain algorithms and additional  guidance. Performed at [facility], [address] [place], [place] [address]   Valproic acid  level     Status: None   Collection Time: 09/11/23 11:35 PM  Result Value Ref Range   Valproic Acid  Lvl 55 50 - 100 ug/mL    Comment: Performed at [facility], [address], [place], [place] [address]  CK     Status: None   Collection Time: 09/11/23 11:35 PM  Result Value Ref Range   Total CK 177 38 - 234 U/L    Comment: Performed at [facility], [address] [place], [place] [address]  Magnesium     Status: None   Collection Time: 09/11/23 11:35 PM  Result Value Ref Range   Magnesium 2.1 1.7 - 2.4 mg/dL    Comment: Performed at [facility], [address] [place], [place] [address]  Blood gas, venous (at [facility] and [facility])     Status: None   Collection Time: 09/12/23 12:51 AM  Result Value Ref Range   pH, Ven 7.34 7.25 - 7.43   pCO2, Ven 50 44 - 60 mmHg   pO2, Ven 43 32 - 45 mmHg   Bicarbonate 27.0 20.0 - 28.0 mmol/L   Acid-Base Excess 0.5 0.0 - 2.0 mmol/L   O2 Saturation 70.8 %   Patient temperature 37.0     Comment: Performed at [facility],  [address], [place], [place]  [address]  Urinalysis, Routine w reflex microscopic -Urine, Clean Catch     Status: None   Collection Time: 09/12/23  2:57 AM  Result Value Ref Range   Color, Urine YELLOW YELLOW   APPearance CLEAR CLEAR   Specific Gravity, Urine 1.013 1.005 - 1.030   pH 6.0 5.0 - 8.0   Glucose, UA NEGATIVE NEGATIVE mg/dL   Hgb urine dipstick NEGATIVE NEGATIVE   Bilirubin Urine NEGATIVE NEGATIVE   Ketones, ur NEGATIVE NEGATIVE mg/dL   Protein, ur NEGATIVE NEGATIVE mg/dL   Nitrite NEGATIVE NEGATIVE   Leukocytes,Ua NEGATIVE NEGATIVE    Comment: Performed at [facility], [address] [place], [place] [address]   DG Chest Portable 1 View Result Date: 09/12/2023 EXAM: 1 VIEW XRAY OF THE CHEST 09/12/2023 02:40:00 AM COMPARISON: CT chest dated 08/06/2015. CLINICAL HISTORY: AMS. Seizures, altered mental status. FINDINGS: LUNGS AND PLEURA: Increased interstitial markings with mild lower lobe predominance, favoring multifocal pneumonia over interstitial edema. No pleural effusion. No pneumothorax. HEART AND MEDIASTINUM: No acute abnormality of the cardiac and mediastinal silhouettes. BONES AND SOFT TISSUES: No acute osseous abnormality. IMPRESSION: 1. Increased interstitial markings with mild lower lobe predominance, favoring multifocal pneumonia over interstitial edema. Electronically signed by: Zadie Herter MD 09/12/2023 02:45 AM EDT RP Workstation: [id number]   CT Head Wo Contrast Result Date: 09/12/2023 CLINICAL DATA:  Status post seizure. EXAM: CT HEAD WITHOUT CONTRAST TECHNIQUE: Contiguous axial images were obtained from the base of the skull through the vertex without intravenous contrast. RADIATION DOSE REDUCTION: This exam was performed according to the departmental dose-optimization program which includes automated exposure control, adjustment of the mA and/or kV according to patient size and/or use of iterative reconstruction technique. COMPARISON:  November 27, 2021 FINDINGS: Brain:  There is generalized cerebral atrophy with widening of the extra-axial spaces and ventricular dilatation. There are areas of decreased attenuation within the white matter tracts of the supratentorial brain, consistent with microvascular disease changes. Vascular: No hyperdense vessel or unexpected calcification. Skull: Normal. Negative for fracture or focal lesion. Sinuses/Orbits: No acute finding. Other: None. IMPRESSION: 1. Generalized cerebral atrophy and microvascular disease changes of the supratentorial brain. 2. No acute intracranial abnormality. Electronically Signed   By: Virgle Grime M.D.   On: 09/12/2023 00:19    Pending Labs Unresulted Labs (From admission, onward)     Start     Ordered   09/11/23 2331  Levetiracetam  level  Once,   URGENT        09/11/23 2331            Vitals/Pain Today's Vitals   09/12/23 0031 09/12/23 0059 09/12/23 0219 09/12/23 0345  BP:  126/65  121/64  Pulse:  63  60  Resp:  19  14  Temp:   (S) (!) 96.9 F (36.1 C)   TempSrc:   Rectal   SpO2: 100% 100%  96%  PainSc:        Isolation Precautions No active isolations  Medications Medications  enoxaparin  (LOVENOX ) injection 40 mg (has no administration in time range)  acetaminophen  (TYLENOL ) tablet 650 mg (has no administration in time range)    Or  acetaminophen  (TYLENOL ) suppository 650 mg (has no administration in time range)  docusate sodium  (COLACE) capsule 100 mg (has no administration in time range)  oxyCODONE (Oxy IR/ROXICODONE) immediate release tablet 5 mg (has no administration in time range)  ondansetron  (ZOFRAN ) tablet 4 mg (has no administration in time range)  Or  ondansetron  (ZOFRAN ) injection 4 mg (has no administration in time range)  levETIRAcetam  (KEPPRA ) undiluted injection 1,500 mg (1,500 mg Intravenous Given 09/11/23 2347)  lacosamide  (VIMPAT ) 100 mg in sodium chloride  0.9 % 25 mL IVPB (0 mg Intravenous Stopped 09/12/23 0143)    Mobility Pt has currently been  resting throughout care, asleep

## 2023-09-12 NOTE — Plan of Care (Signed)
 Discussed with Dr. Alison Irvine this is a 73 year old woman with a past medical history of epilepsy on Depakote  and Keppra , pulmonary sarcoidosis, hypertension coming in with a breakthrough seizure of unclear etiology at this time.  Triage note also mentions some increased lethargy for past 1 month, last seizure 1 month ago  I am asked for medication recommendations, per ED provider patient is postictal but gradually coming around   Outpatient neurology note reviewed from 05/2022 [subsequent note from 02/2023 indicates Depakote  was increased to 750 nightly]:  Patient had a fall in 2012 and developed frontal subdural hematoma.  Within a few years she started having episodes of staring, repetitive movements of her fingers and hands.  In 2017 she had a generalized convulsive seizure.  She was diagnosed with seizure disorder and started on Keppra  500mg  twice a day.  She was managed by several neurologists over the years including [facility] clinic neurology, [facility] neurology, [facility] neurology and then [facility] neurology.  She tried prior medications including Vimpat  [appears this was discontinued due to cost back in 2018], Depakote , Keppra  and zonisamide .   Patient had breakthrough seizure in July and August 2023 and therefore Depakote  dose was increased.  Patient then developed fairly acute generalized malaise and decline with confusion.  She had a hospitalized diagnosed with possible toxic metabolic encephalopathy, possibly related to increased Depakote .  Medications were adjusted and eventually patient gradually improved over the course of several months.  Patient continued to have 2-3 minor seizures and staring spells per month.   Patient also was referred to cognitive behavioral clinic on October 2023 due to progressive memory, gait difficulty, urinary incontinence.  Differential diagnosis of vascular dementia, neurodegenerative dementia, NPH was raised.  NPH was felt not to be likely.  She had CSF  analysis to look for Alzheimer's related dementia but this was negative.  Therefore possibility of mild vascular dementia remained as a possible diagnosis.  Recommendations Given that she could not tolerate higher doses of Depakote  in the past would continue Depakote  at current dose (750 ER nightly), continue Keppra  1000 mg twice daily  When able would confirm with family about whether they did increase dose to 750 nightly as recommended or have kept her on 500 nightly Would add back Vimpat , give 100 mg now then 50 mg twice daily, recheck pharmacy benefit for current cost to confirm it will be okay for her to continue this medication Check CK, magnesium, infectious workup per ED Recommend admission to [facility] in case she is not returning to baseline and needs EEG or full neurology consultation is needed    Basic Metabolic Panel: Recent Labs  Lab 09/11/23 2335  NA 137  K 4.2  CL 105  CO2 22  GLUCOSE 100*  BUN 11  CREATININE 0.88  CALCIUM 8.7*    CBC: Recent Labs  Lab 09/11/23 2335  WBC 5.0  NEUTROABS 2.4  HGB 15.3*  HCT 47.8*  MCV 94.8  PLT 163    Coagulation Studies: No results for input(s): LABPROT, INR in the last 72 hours.   Valproic acid  level resulted at 55, within therapeutic range

## 2023-09-13 ENCOUNTER — Inpatient Hospital Stay (HOSPITAL_COMMUNITY)

## 2023-09-13 ENCOUNTER — Telehealth (HOSPITAL_COMMUNITY): Payer: Self-pay | Admitting: Pharmacy Technician

## 2023-09-13 ENCOUNTER — Other Ambulatory Visit (HOSPITAL_COMMUNITY): Payer: Self-pay

## 2023-09-13 DIAGNOSIS — G40909 Epilepsy, unspecified, not intractable, without status epilepticus: Secondary | ICD-10-CM | POA: Diagnosis not present

## 2023-09-13 DIAGNOSIS — I10 Essential (primary) hypertension: Secondary | ICD-10-CM

## 2023-09-13 DIAGNOSIS — R569 Unspecified convulsions: Secondary | ICD-10-CM | POA: Diagnosis not present

## 2023-09-13 DIAGNOSIS — R2243 Localized swelling, mass and lump, lower limb, bilateral: Secondary | ICD-10-CM | POA: Diagnosis not present

## 2023-09-13 LAB — BASIC METABOLIC PANEL WITH GFR
Anion gap: 7 (ref 5–15)
BUN: 10 mg/dL (ref 8–23)
CO2: 23 mmol/L (ref 22–32)
Calcium: 8.5 mg/dL — ABNORMAL LOW (ref 8.9–10.3)
Chloride: 105 mmol/L (ref 98–111)
Creatinine, Ser: 0.83 mg/dL (ref 0.44–1.00)
GFR, Estimated: >60 mL/min (ref >60)
Glucose, Bld: 99 mg/dL (ref 70–99)
Potassium: 3.8 mmol/L (ref 3.5–5.1)
Sodium: 135 mmol/L (ref 135–145)

## 2023-09-13 LAB — ECHOCARDIOGRAM COMPLETE BUBBLE STUDY
AR max vel: 2.21 cm2
AV Area VTI: 2.15 cm2
AV Area mean vel: 2.11 cm2
AV Mean grad: 3 mmHg
AV Peak grad: 6 mmHg
Ao pk vel: 1.22 m/s
Area-P 1/2: 3.48 cm2
S' Lateral: 2.05 cm

## 2023-09-13 LAB — LEVETIRACETAM LEVEL: Levetiracetam Lvl: 2.4 ug/mL — ABNORMAL LOW (ref 10.0–40.0)

## 2023-09-13 MED ORDER — FLUOXETINE HCL 10 MG PO CAPS
10.0000 mg | ORAL_CAPSULE | Freq: Every day | ORAL | Status: DC
  Administered 2023-09-13: 10 mg via ORAL
  Filled 2023-09-13: qty 1

## 2023-09-13 MED ORDER — LACOSAMIDE 50 MG PO TABS
50.0000 mg | ORAL_TABLET | Freq: Two times a day (BID) | ORAL | 0 refills | Status: DC
  Filled 2023-09-13: qty 60, 30d supply, fill #0

## 2023-09-13 MED ORDER — LEVETIRACETAM 100 MG/ML PO SOLN
1000.0000 mg | Freq: Two times a day (BID) | ORAL | 0 refills | Status: DC
  Filled 2023-09-13: qty 600, 30d supply, fill #0

## 2023-09-13 NOTE — Assessment & Plan Note (Signed)
-   Very mild lower extremity edema; possibly from amlodipine .  Does not have any significant heart failure signs on exam or from clinical history -Echo obtained during hospitalization: EF 60 to 65%, no RWMA, mild LVH.  Normal diastolic parameters -Continue home antihypertensive regimen -If lower extremity edema remains a concern, could consider modification of amlodipine  to alternative agent; her edema on exam was trace

## 2023-09-13 NOTE — Progress Notes (Signed)
 AVS gone over with pt, all questions answered. Pt taken to exit via wheelchair by NT, they stopped by [facility] pharmacy to pick up meds on the way out.

## 2023-09-13 NOTE — Progress Notes (Signed)
 Physical Therapy Treatment Patient Details Name: Alicia Brennan MRN: [id number] DOB: Aug 05, 1950 Today's Date: 09/13/2023   History of Present Illness Pt is a 73 yo female admitted to [facility] on 09/11/23 for recurrent seizures. PMH of pulmonary sarcoidosis, IBS, HTN, and seizures    PT Comments  Pt tolerated treatment well today. Pt received for second session in order to practice stairs prior to DC. Pt able to navigate stairs with CGA. No change in DC/DME recs at this time. PT will continue to follow.     If plan is discharge home, recommend the following: A little help with walking and/or transfers;A little help with bathing/dressing/bathroom;Assistance with cooking/housework;Direct supervision/assist for medications management;Assist for transportation;Help with stairs or ramp for entrance;Supervision due to cognitive status   Can travel by private vehicle        Equipment Recommendations  None recommended by PT    Recommendations for Other Services       Precautions / Restrictions Precautions Precautions: Fall Precaution/Restrictions Comments: seizures Restrictions Weight Bearing Restrictions Per Provider Order: No     Mobility  Bed Mobility Overal bed mobility: Needs Assistance Bed Mobility: Supine to Sit, Sit to Supine     Supine to sit: Contact guard Sit to supine: Min assist   General bed mobility comments: min A sit>sup to help RLE into bed. Able to get to EOB without physical assist + bed rails.    Transfers Overall transfer level: Needs assistance Equipment used: Rolling walker (2 wheels) Transfers: Sit to/from Stand Sit to Stand: Mod assist           General transfer comment: Mod A to boost into standing. Pt with very poor processing and problem solving. Constant cues for hand placement.    Ambulation/Gait Ambulation/Gait assistance: Contact guard assist, Min assist Gait Distance (Feet): 75 Feet Assistive device: Rolling walker (2 wheels) Gait  Pattern/deviations: Trunk flexed, Decreased stride length, Step-through pattern Gait velocity: decreased     General Gait Details: Min A to steady initially. Pt with very slow step through pattern. Cues for upright posture. pt would likely benefit much better from RW.   Stairs Stairs: Yes Stairs assistance: Contact guard assist Stair Management: Two rails, Step to pattern, Forwards Number of Stairs: 2 General stair comments: No LOB noted.   Wheelchair Mobility     Tilt Bed    Modified Rankin (Stroke Patients Only)       Balance Overall balance assessment: Needs assistance Sitting-balance support: No upper extremity supported, Feet supported Sitting balance-Leahy Scale: Good     Standing balance support: During functional activity Standing balance-Leahy Scale: Fair Standing balance comment: Took lateral steps EOB no AD                            Communication Communication Communication: No apparent difficulties  Cognition Arousal: Alert Behavior During Therapy: WFL for tasks assessed/performed   PT - Cognitive impairments: Safety/Judgement, Problem solving, Sequencing                       PT - Cognition Comments: Very poor processing and problem solving. Very poor awareness of deficits. Following commands: Intact      Cueing Cueing Techniques: Verbal cues  Exercises      General Comments General comments (skin integrity, edema, etc.): VSS      Pertinent Vitals/Pain Pain Assessment Pain Assessment: No/denies pain    Home Living Family/patient expects to be discharged to:: Private residence  Living Arrangements: Children (son and DIL) Available Help at Discharge: Family;Available PRN/intermittently Type of Home: Mobile home Home Access: Stairs to enter Entrance Stairs-Rails: Right;Left;Can reach both Entrance Stairs-Number of Steps: 4   Home Layout: One level Home Equipment: Agricultural consultant (2 wheels);Shower seat;Cane - single  point;Hand held shower head;Grab bars - tub/shower Additional Comments: Pt reports she is sometimes at home alone.    Prior Function            PT Goals (current goals can now be found in the care plan section) Acute Rehab PT Goals Patient Stated Goal: to go home PT Goal Formulation: With patient Time For Goal Achievement: 09/27/23 Potential to Achieve Goals: Fair Progress towards PT goals: Progressing toward goals    Frequency    Min 2X/week      PT Plan      Co-evaluation              AM-PAC PT 6 Clicks Mobility   Outcome Measure  Help needed turning from your back to your side while in a flat bed without using bedrails?: A Little Help needed moving from lying on your back to sitting on the side of a flat bed without using bedrails?: A Little Help needed moving to and from a bed to a chair (including a wheelchair)?: A Little Help needed standing up from a chair using your arms (e.g., wheelchair or bedside chair)?: A Little Help needed to walk in hospital room?: A Little Help needed climbing 3-5 steps with a railing? : A Little 6 Click Score: 18    End of Session Equipment Utilized During Treatment: Gait belt Activity Tolerance: Patient tolerated treatment well Patient left: in bed;with call bell/phone within reach;with bed alarm set Nurse Communication: Mobility status PT Visit Diagnosis: Other abnormalities of gait and mobility (R26.89)     Time: 1610-9604 PT Time Calculation (min) (ACUTE ONLY): 24 min  Charges:    $Gait Training: 23-37 mins PT General Charges $$ ACUTE PT VISIT: 1 Visit                     Rodgers Clack, PT, DPT Acute Rehab Services [phone]    Alicia Brennan 09/13/2023, 3:59 PM

## 2023-09-13 NOTE — Care Management CC44 (Signed)
 Condition Code 44 Documentation Completed  Patient Details  Name: Alicia Brennan MRN: [id number] Date of Birth: 1950-12-22   Condition Code 44 given:  Yes Patient signature on Condition Code 44 notice:  Yes Documentation of 2 MD's agreement:  Yes Code 44 added to claim:  Yes    Jonathan Neighbor, RN 09/13/2023, 2:58 PM

## 2023-09-13 NOTE — Evaluation (Addendum)
 Occupational Therapy Evaluation Patient Details Name: Alicia Brennan MRN: [id number] DOB: [birth year] Today's Date: 09/13/2023   History of Present Illness   Pt is a 73 yo female admitted to [facility] on 09/11/23 for recurrent seizures. PMH of pulmonary sarcoidosis, IBS, HTN, and seizures     Clinical Impressions Pt admitted for above, PTA pt reports being ind with ADLs except having assist to don socks, reports ambulating with SPC. Pt currently presenting with impaired balance compared to baseline. Pt needing Mod A to rise into standing but once up pt able to ambulate in room with CGA using SPC. She also requires mod A to setup A for ADLs. Pt likely to progress while in acute setting, OT to continue following pt acutely to address listed deficits and help transition to next level of care. Patient would benefit from post acute Home OT services to help maximize functional independence in natural environment with anticipated progression and direct supervision at home for ADLs and mobility from family support.      If plan is discharge home, recommend the following:   Assistance with feeding;A little help with bathing/dressing/bathroom;Assistance with cooking/housework     Functional Status Assessment   Patient has had a recent decline in their functional status and demonstrates the ability to make significant improvements in function in a reasonable and predictable amount of time.     Equipment Recommendations   None recommended by OT     Recommendations for Other Services         Precautions/Restrictions   Precautions Precautions: Fall Precaution/Restrictions Comments: seizures Restrictions Weight Bearing Restrictions Per Provider Order: No     Mobility Bed Mobility Overal bed mobility: Needs Assistance Bed Mobility: Supine to Sit, Sit to Supine     Supine to sit: Contact guard Sit to supine: Min assist   General bed mobility comments: min A sit>sup to help RLE into  bed. Able to get to EOB without physical assist + bed rails.    Transfers Overall transfer level: Needs assistance Equipment used: Straight cane Transfers: Sit to/from Stand Sit to Stand: Mod assist           General transfer comment: Mod A to boost into standing.      Balance Overall balance assessment: Needs assistance Sitting-balance support: No upper extremity supported, Feet supported Sitting balance-Leahy Scale: Good     Standing balance support: During functional activity Standing balance-Leahy Scale: Fair Standing balance comment: Took lateral steps EOB no AD                           ADL either performed or assessed with clinical judgement   ADL Overall ADL's : Needs assistance/impaired Eating/Feeding: Independent;Sitting   Grooming: Standing;Oral care;Contact guard assist   Upper Body Bathing: Sitting;Set up   Lower Body Bathing: Sitting/lateral leans;Set up Lower Body Bathing Details (indicate cue type and reason): uses shower seat at home Upper Body Dressing : Sitting;Set up   Lower Body Dressing: Sit to/from stand;Moderate assistance Lower Body Dressing Details (indicate cue type and reason): min A for balance, can reach lower body garment at knee level but nothing lower Toilet Transfer: Contact guard assist;Ambulation (SPC)   Toileting- Clothing Manipulation and Hygiene: Minimal assistance;Sit to/from stand       Functional mobility during ADLs: Contact guard assist;Cane       Vision   Vision Assessment?: No apparent visual deficits     Perception Perception: Within Functional Limits  Praxis Praxis: [facility]       Pertinent Vitals/Pain Pain Assessment Pain Assessment: No/denies pain     Extremity/Trunk Assessment Upper Extremity Assessment Upper Extremity Assessment: Defer to OT evaluation   Lower Extremity Assessment Lower Extremity Assessment: Overall WFL for tasks assessed   Cervical / Trunk Assessment Cervical /  Trunk Assessment: Kyphotic   Communication Communication Communication: No apparent difficulties   Cognition Arousal: Alert Behavior During Therapy: WFL for tasks assessed/performed Cognition: No family/caregiver present to determine baseline                               Following commands: Intact       Cueing  General Comments   Cueing Techniques: Verbal cues  VSS   Exercises     Shoulder Instructions      Home Living Family/patient expects to be discharged to:: Private residence Living Arrangements: Children (son and DIL) Available Help at Discharge: Family;Available PRN/intermittently Type of Home: Mobile home Home Access: Stairs to enter Entrance Stairs-Number of Steps: 4 Entrance Stairs-Rails: Right;Left;Can reach both Home Layout: One level     Bathroom Shower/Tub: Producer, television/film/video:  (comfort height)     Home Equipment: Agricultural consultant (2 wheels);Shower seat;Cane - single point;Hand held shower head;Grab bars - tub/shower   Additional Comments: Pt reports she is sometimes at home alone.      Prior Functioning/Environment Prior Level of Function : Independent/Modified Independent             Mobility Comments: Mod I with spc ADLs Comments: ind, does not drive. manages own medications.    OT Problem List: Impaired balance (sitting and/or standing)   OT Treatment/Interventions: Self-care/ADL training;Therapeutic exercise;Patient/family education;Therapeutic activities;Balance training      OT Goals(Current goals can be found in the care plan section)   Acute Rehab OT Goals Patient Stated Goal: to hopefully go home OT Goal Formulation: With patient Time For Goal Achievement: 09/27/23 Potential to Achieve Goals: Good ADL Goals Pt Will Perform Grooming: with modified independence;standing Pt Will Perform Lower Body Bathing: with set-up;sitting/lateral leans Pt Will Perform Lower Body Dressing: with supervision;sit  to/from stand Pt Will Transfer to Toilet: with supervision;ambulating Additional ADL Goal #2: Pt will demonstrate independent management of medications   OT Frequency:  Min 2X/week    Co-evaluation              AM-PAC OT 6 Clicks Daily Activity     Outcome Measure Help from another person eating meals?: None Help from another person taking care of personal grooming?: A Little Help from another person toileting, which includes using toliet, bedpan, or urinal?: A Little Help from another person bathing (including washing, rinsing, drying)?: A Little Help from another person to put on and taking off regular upper body clothing?: A Little Help from another person to put on and taking off regular lower body clothing?: A Little 6 Click Score: 19   End of Session Equipment Utilized During Treatment: Gait belt;Other (comment) [facility]) Nurse Communication: Mobility status  Activity Tolerance: Patient tolerated treatment well Patient left: in bed;with call bell/phone within reach;with bed alarm set  OT Visit Diagnosis: Unsteadiness on feet (R26.81)                Time: 4098-1191 OT Time Calculation (min): 32 min Charges:  OT General Charges $OT Visit: 1 Visit OT Evaluation $OT Eval Low Complexity: 1 Low  09/13/2023  AB, OTR/L  Acute Rehabilitation Services  Office: [phone]   Jorene New 09/13/2023, 4:09 PM

## 2023-09-13 NOTE — TOC Transition Note (Signed)
 Transition of Care [facility]) - Discharge Note   Patient Details  Name: Alicia Brennan MRN: [id number] Date of Birth: 07-19-1950  [facility] of [facility]) CM/SW Contact:  Jonathan Neighbor, RN Phone Number: 09/13/2023, 1:21 PM   Clinical Narrative:     Pt is from home with her son and daughter in law. Son and DIL work days outside of the home. Daughter at the bedside and states pt sometimes is also at her home and has her granddaughter to check on her. Family provides needed transportation. Pts PCP office also transports pts for their appointment. Pt manages her own medications and denies any issues.  DME at home: BSC/ wheelchair/ walker/ cane/ shower seat Home health arranged with [facility] Information on the AVS. Gasper Karst will contact her for the first home visit. Pt has transportation home.  Final next level of care: Home w Home Health Services Barriers to Discharge: No Barriers Identified   Patient Goals and CMS Choice   CMS Medicare.gov Compare Post Acute Care list provided to:: Patient Choice offered to / list presented to : Patient      Discharge Placement                       Discharge Plan and Services Additional resources added to the After Visit Summary for                            [facility] Arranged: PT, OT [facility] Agency: [facility] Date [facility] Agency Contacted: 09/13/23   Representative spoke with at [facility] Agency: Randel Buss  Social Drivers of Health (SDOH) Interventions SDOH Screenings   Food Insecurity: No Food Insecurity (12/09/2021)   Received from [facility]  Transportation Needs: No Transportation Needs (12/09/2021)   Received from [facility]  Financial Resource Strain: Low Risk  (12/09/2021)   Received from [facility]  Tobacco Use: Low Risk  (09/12/2023)     Readmission Risk Interventions     No data to display

## 2023-09-13 NOTE — Telephone Encounter (Signed)
 Pharmacy Patient Advocate Encounter  Insurance verification completed.    The patient is insured through [employer].     Ran test claim for Lacosamide  50mg  tablets and the current 30 day co-pay is $0.00.   This test claim was processed through [facility]- copay amounts may vary at other pharmacies due to pharmacy/plan contracts, or as the patient moves through the different stages of their insurance plan.

## 2023-09-13 NOTE — Assessment & Plan Note (Signed)
-   Strong suspicion that patient may be incorrectly taking Keppra ; she adamantly endorsed taking it as prescribed but she has leftover bottles and pharmacy fill history does not align with her being compliant or taking as prescribed -Discussed with neurology prior to discharge as well; continued on vimpat , keppra , depakote  (no changes in prior home regimen, just adding vimpat  for now) - Referral placed to neurology as she is now relocating to [facility] -Repeat Keppra  and valproic acid  levels in about 1 month

## 2023-09-13 NOTE — Progress Notes (Signed)
*  PRELIMINARY RESULTS* Echocardiogram 2D Echocardiogram has been performed.  Glenna Lango 09/13/2023, 12:43 PM

## 2023-09-13 NOTE — Care Management Obs Status (Signed)
 MEDICARE OBSERVATION STATUS NOTIFICATION   Patient Details  Name: Alicia Brennan MRN: [id number] Date of Birth: November 01, 1950   Medicare Observation Status Notification Given:  Yes    Jonathan Neighbor, RN 09/13/2023, 2:58 PM

## 2023-09-13 NOTE — Discharge Summary (Signed)
 Physician Discharge Summary   Alicia Brennan ZOX:[id number] DOB: 11-Nov-1950 DOA: 09/11/2023  PCP: Arzella Laurence, MD  Admit date: 09/11/2023 Discharge date: 09/13/2023  Admitted From: Home Disposition:  Home Discharging physician: Faith Homes, MD Barriers to discharge: none  Recommendations at discharge: Referral placed to neurology to est care; new to [facility] Repeat Keppra  and Depakote  levels Confirm better compliance with Keppra  and/or if family monitoring dosage administration   Discharge Condition: stable CODE STATUS: Full  Diet recommendation:  Diet Orders (From admission, onward)     Start     Ordered   09/13/23 0000  Diet - low sodium heart healthy        09/13/23 1453   09/12/23 1124  Diet regular Room service appropriate? Yes; Fluid consistency: Thin  Diet effective now       Question Answer Comment  Room service appropriate? Yes   Fluid consistency: Thin      09/12/23 1124            Hospital Course: Alicia Brennan is a 73 y.o. female with medical history significant of pulmonary sarcoidosis, IBS, HTN, and seizures p/w recurrent seziure.  She is on Keppra  and Depakote  at home.  A great amount of time was spent clarifying her compliance and dosages. Ultimately, she states she has been taking Depakote  750 mg at night and Keppra  1000 mg twice daily.  Her Keppra  is solution and concentration is 100 mg/mL.  She says that she takes 10 mL twice a day.  Unfortunately, fill history at her pharmacy is not reflective of being compliant; furthermore, she also has leftover bottles of Keppra  still at home. Her pharmacy was also called to confirm dispense history and no other pharmacies were noted to be dispensing her Keppra  especially after talking with her daughter and son also.  Nevertheless, chart was evaluated by neurology and she was started on Vimpat  on admission also. Valproate level returned at 55 and Keppra  level returned at 2.4 (very subtherapeutic) further  raising concern for noncompliance. With further questioning, her daughter also reports that patient has had progressively more difficulty with onset of cognitive impairment; therefore, she may be inadvertently not taking prescribed amount of Keppra  liquid at home.  Case was discussed with neurology again prior to discharge.  For now, she is continued on Vimpat  in addition to prior home doses of Keppra  and Depakote .  At follow-up, Keppra  level should be repeated and further discussions held regarding whether to continue on Vimpat  or repeat trial with just Keppra  and Depakote  if level is improved along with compliance.  Discussed with her daughter bedside prior to discharge and she states that patient's son is involved with medications and I encouraged them to try and monitor when she is taking her Keppra  specifically to ensure compliance and proper dosing.  Assessment and Plan: * Seizure (HCC) - Strong suspicion that patient may be incorrectly taking Keppra ; she adamantly endorsed taking it as prescribed but she has leftover bottles and pharmacy fill history does not align with her being compliant or taking as prescribed -Discussed with neurology prior to discharge as well; continued on vimpat , keppra , depakote  (no changes in prior home regimen, just adding vimpat  for now) - Referral placed to neurology as [facility] -Repeat Keppra  and valproic acid  levels in about 1 month  Hypertension - Very mild lower extremity edema; possibly from amlodipine .  Does not have any significant heart failure signs on exam or from clinical history -Echo obtained during hospitalization: EF  60 to 65%, no RWMA, mild LVH.  Normal diastolic parameters -Continue home antihypertensive regimen -If lower extremity edema remains a concern, could consider modification of amlodipine  to alternative agent; her edema on exam was trace  Sarcoidosis of lung (HCC) - Stable.  No signs of exacerbation.  CT  chest negative for acute findings -No indication for antibiotics    The patient's acute and chronic medical conditions were treated accordingly. On day of discharge, patient was felt deemed stable for discharge. Patient/family member advised to call PCP or come back to ER if needed.   Principal Diagnosis: Seizure [facility])  Discharge Diagnoses: Active Hospital Problems   Diagnosis Date Noted   Seizure (HCC) 09/12/2023    Priority: 1.   Sarcoidosis of lung [facility])    Hypertension     Resolved Hospital Problems  No resolved problems to display.     Discharge Instructions     Ambulatory referral to Neurology   Complete by: As directed    An appointment is requested in approximately: 4 weeks   Diet - low sodium heart healthy   Complete by: As directed    Increase activity slowly   Complete by: As directed       Allergies as of 09/13/2023   No Known Allergies      Medication List     STOP taking these medications    IRON PO       TAKE these medications    amLODipine  10 MG tablet Commonly known as: NORVASC  Take 10 mg by mouth daily.   divalproex  250 MG DR tablet Commonly known as: Depakote  Take 1 tablet (250 mg total) by mouth 3 (three) times daily. What changed: when to take this   FLUoxetine 20 MG capsule Commonly known as: PROZAC Take 1 capsule by mouth daily.   lacosamide  50 MG Tabs tablet Commonly known as: VIMPAT  Take 1 tablet (50 mg total) by mouth 2 (two) times daily.   levETIRAcetam  100 MG/ML solution Commonly known as: KEPPRA  Take 10 mLs (1,000 mg total) by mouth 2 (two) times daily.   loperamide  2 MG tablet Commonly known as: IMODIUM  A-D Take 2 mg by mouth daily.   telmisartan 80 MG tablet Commonly known as: MICARDIS Take 80 mg by mouth daily.        Follow-up Information     [facility], [facility] Follow up.   Specialty: Home Health Services Why: Gasper Karst will contact you for the first home visit. Contact information: [address] [place] [address] [phone]                No Known Allergies  Consultations: Case discussed with neurology  Procedures:   Discharge Exam: BP (!) 146/76 (BP Location: Right Arm)   Pulse 66   Temp 98.4 F (36.9 C) (Oral)   Resp 18   SpO2 98%  Physical Exam Constitutional:      Appearance: Normal appearance.  HENT:     Head: Normocephalic and atraumatic.     Mouth/Throat:     Mouth: Mucous membranes are moist.   Eyes:     Extraocular Movements: Extraocular movements intact.    Cardiovascular:     Rate and Rhythm: Normal rate and regular rhythm.  Pulmonary:     Effort: Pulmonary effort is normal. No respiratory distress.     Breath sounds: Normal breath sounds. No wheezing.  Abdominal:     General: Bowel sounds are normal. There is no distension.  Palpations: Abdomen is soft.     Tenderness: There is no abdominal tenderness.   Musculoskeletal:        General: Normal range of motion.     Cervical back: Normal range of motion and neck supple.     Right lower leg: Edema (trace) present.     Left lower leg: Edema (trace) present.   Skin:    General: Skin is warm and dry.   Neurological:     General: No focal deficit present.     Mental Status: She is alert.   Psychiatric:        Mood and Affect: Mood normal.      The results of significant diagnostics from this hospitalization (including imaging, microbiology, ancillary and laboratory) are listed below for reference.   Microbiology: Recent Results (from the past 240 hours)  Blood culture (routine x 2)     Status: None (Preliminary result)   Collection Time: 09/12/23  5:10 AM   Specimen: BLOOD  Result Value Ref Range Status   Specimen Description   Final    BLOOD LEFT ANTECUBITAL Performed at [facility], [address] [place], [place] [address]    Special Requests   Final    BOTTLES DRAWN AEROBIC AND ANAEROBIC Blood Culture adequate  volume Performed at [facility], [address], [place], [place] [address]    Culture   Final    NO GROWTH < 24 HOURS Performed at [facility], [address], [place], [place] [address]    Report Status PENDING  Incomplete  Respiratory (~20 pathogens) panel by PCR     Status: None   Collection Time: 09/12/23 10:42 AM   Specimen: Nasopharyngeal Swab; Respiratory  Result Value Ref Range Status   Adenovirus NOT DETECTED NOT DETECTED Final   Coronavirus 229E NOT DETECTED NOT DETECTED Final    Comment: (NOTE) The Coronavirus on the Respiratory Panel, DOES NOT test for the novel  Coronavirus (2019 nCoV)    Coronavirus HKU1 NOT DETECTED NOT DETECTED Final   Coronavirus NL63 NOT DETECTED NOT DETECTED Final   Coronavirus OC43 NOT DETECTED NOT DETECTED Final   Metapneumovirus NOT DETECTED NOT DETECTED Final   Rhinovirus / Enterovirus NOT DETECTED NOT DETECTED Final   Influenza A NOT DETECTED NOT DETECTED Final   Influenza B NOT DETECTED NOT DETECTED Final   Parainfluenza Virus 1 NOT DETECTED NOT DETECTED Final   Parainfluenza Virus 2 NOT DETECTED NOT DETECTED Final   Parainfluenza Virus 3 NOT DETECTED NOT DETECTED Final   Parainfluenza Virus 4 NOT DETECTED NOT DETECTED Final   Respiratory Syncytial Virus NOT DETECTED NOT DETECTED Final   Bordetella pertussis NOT DETECTED NOT DETECTED Final   Bordetella Parapertussis NOT DETECTED NOT DETECTED Final   Chlamydophila pneumoniae NOT DETECTED NOT DETECTED Final   Mycoplasma pneumoniae NOT DETECTED NOT DETECTED Final    Comment: Performed at [facility], [address] [place], [place] [address]  Blood culture (routine x 2)     Status: None (Preliminary result)   Collection Time: 09/12/23 11:38 AM   Specimen: BLOOD RIGHT HAND  Result Value Ref Range Status   Specimen Description BLOOD RIGHT HAND  Final   Special Requests   Final    AEROBIC BOTTLE ONLY Blood Culture results may not be optimal due to an  inadequate volume of blood received in culture bottles   Culture   Final    NO GROWTH < 24 HOURS Performed at [facility], [address], [place], [place] [address]    Report Status PENDING  Incomplete     Labs: BNP (last 3 results) Recent Labs    09/12/23 1138  BNP 45.2   Basic Metabolic Panel: Recent Labs  Lab 09/11/23 2335 09/12/23 1138 09/13/23 0444  NA 137  --  135  K 4.2  --  3.8  CL 105  --  105  CO2 22  --  23  GLUCOSE 100*  --  99  BUN 11  --  10  CREATININE 0.88 0.76 0.83  CALCIUM 8.7*  --  8.5*  MG 2.1  --   --    Liver Function Tests: Recent Labs  Lab 09/11/23 2335  AST 53*  ALT 32  ALKPHOS 88  BILITOT 1.2  PROT 7.5  ALBUMIN  3.1*   No results for input(s): LIPASE, AMYLASE in the last 168 hours. No results for input(s): AMMONIA in the last 168 hours. CBC: Recent Labs  Lab 09/11/23 2335 09/12/23 1138  WBC 5.0 4.0  NEUTROABS 2.4  --   HGB 15.3* 15.1*  HCT 47.8* 46.7*  MCV 94.8 92.8  PLT 163 162   Cardiac Enzymes: Recent Labs  Lab 09/11/23 2335  CKTOTAL 177   BNP: Invalid input(s): POCBNP CBG: No results for input(s): GLUCAP in the last 168 hours. D-Dimer No results for input(s): DDIMER in the last 72 hours. Hgb A1c No results for input(s): HGBA1C in the last 72 hours. Lipid Profile No results for input(s): CHOL, HDL, LDLCALC, TRIG, CHOLHDL, LDLDIRECT in the last 72 hours. Thyroid function studies No results for input(s): TSH, T4TOTAL, T3FREE, THYROIDAB in the last 72 hours.  Invalid input(s): FREET3 Anemia work up No results for input(s): VITAMINB12, FOLATE, FERRITIN, TIBC, IRON, RETICCTPCT in the last 72 hours. Urinalysis    Component Value Date/Time   COLORURINE YELLOW 09/12/2023 0257   APPEARANCEUR CLEAR 09/12/2023 0257   APPEARANCEUR Clear 05/17/2013 1525   LABSPEC 1.013 09/12/2023 0257   LABSPEC 1.019 05/17/2013 1525   PHURINE 6.0 09/12/2023 0257   GLUCOSEU  NEGATIVE 09/12/2023 0257   GLUCOSEU Negative 05/17/2013 1525   HGBUR NEGATIVE 09/12/2023 0257   BILIRUBINUR NEGATIVE 09/12/2023 0257   BILIRUBINUR Negative 05/17/2013 1525   KETONESUR NEGATIVE 09/12/2023 0257   PROTEINUR NEGATIVE 09/12/2023 0257   NITRITE NEGATIVE 09/12/2023 0257   LEUKOCYTESUR NEGATIVE 09/12/2023 0257   LEUKOCYTESUR Trace 05/17/2013 1525   Sepsis Labs Recent Labs  Lab 09/11/23 2335 09/12/23 1138  WBC 5.0 4.0   Microbiology Recent Results (from the past 240 hours)  Blood culture (routine x 2)     Status: None (Preliminary result)   Collection Time: 09/12/23  5:10 AM   Specimen: BLOOD  Result Value Ref Range Status   Specimen Description   Final    BLOOD LEFT ANTECUBITAL Performed at [facility], [address] [place], [place] [address]    Special Requests   Final    BOTTLES DRAWN AEROBIC AND ANAEROBIC Blood Culture adequate volume Performed at [facility], [address], [place], [place] [address]    Culture   Final    NO GROWTH < 24 HOURS Performed at [facility], [address], [place], [place] [address]    Report Status PENDING  Incomplete  Respiratory (~20 pathogens) panel by PCR     Status: None   Collection Time: 09/12/23 10:42 AM   Specimen: Nasopharyngeal Swab; Respiratory  Result Value Ref Range Status  Adenovirus NOT DETECTED NOT DETECTED Final   Coronavirus 229E NOT DETECTED NOT DETECTED Final    Comment: (NOTE) The Coronavirus on the Respiratory Panel, DOES NOT test for the novel  Coronavirus (2019 nCoV)    Coronavirus HKU1 NOT DETECTED NOT DETECTED Final   Coronavirus NL63 NOT DETECTED NOT DETECTED Final   Coronavirus OC43 NOT DETECTED NOT DETECTED Final   Metapneumovirus NOT DETECTED NOT DETECTED Final   Rhinovirus / Enterovirus NOT DETECTED NOT DETECTED Final   Influenza A NOT DETECTED NOT DETECTED Final   Influenza B NOT DETECTED NOT DETECTED Final   Parainfluenza Virus 1 NOT  DETECTED NOT DETECTED Final   Parainfluenza Virus 2 NOT DETECTED NOT DETECTED Final   Parainfluenza Virus 3 NOT DETECTED NOT DETECTED Final   Parainfluenza Virus 4 NOT DETECTED NOT DETECTED Final   Respiratory Syncytial Virus NOT DETECTED NOT DETECTED Final   Bordetella pertussis NOT DETECTED NOT DETECTED Final   Bordetella Parapertussis NOT DETECTED NOT DETECTED Final   Chlamydophila pneumoniae NOT DETECTED NOT DETECTED Final   Mycoplasma pneumoniae NOT DETECTED NOT DETECTED Final    Comment: Performed at [facility], [address], [place], [place] [address]  Blood culture (routine x 2)     Status: None (Preliminary result)   Collection Time: 09/12/23 11:38 AM   Specimen: BLOOD RIGHT HAND  Result Value Ref Range Status   Specimen Description BLOOD RIGHT HAND  Final   Special Requests   Final    AEROBIC BOTTLE ONLY Blood Culture results may not be optimal due to an inadequate volume of blood received in culture bottles   Culture   Final    NO GROWTH < 24 HOURS Performed at [facility], [address], [place], [place] [address]    Report Status PENDING  Incomplete    Procedures/Studies: ECHOCARDIOGRAM COMPLETE BUBBLE STUDY Result Date: 09/13/2023    ECHOCARDIOGRAM REPORT   Patient Name:   MICHAEL VENTRESCA Date of Exam: 09/13/2023 Medical Rec #:  [id number]      Height:       61.0 in Accession #:    [id number]     Weight:       152.0 lb Date of Birth:  10-27-1950       BSA:          1.681 m Patient Age:    73 years       BP:           152/69 mmHg Patient Gender: F              HR:           98 bpm. Exam Location:  Inpatient Procedure: 2D Echo, Color Doppler and Cardiac Doppler (Both Spectral and Color            Flow Doppler were utilized during procedure). Indications:    Leg Swelling  History:        Patient has no prior history of Echocardiogram examinations.                 Risk Factors:Hypertension.  Sonographer:    Jeralene Mom Referring Phys: [id number] Arne Langdon  IMPRESSIONS  1. Left ventricular ejection fraction, by estimation, is 60 to 65%. The left ventricle has normal function. The left ventricle has no regional wall motion abnormalities. There is mild concentric left ventricular hypertrophy with moderate hypertrophy of basilar septal segment. Left ventricular diastolic parameters were normal.  2. Right ventricular systolic function is normal. The  right ventricular size is normal.  3. The mitral valve is normal in structure. Trivial mitral valve regurgitation. No evidence of mitral stenosis.  4. The aortic valve is tricuspid. Aortic valve regurgitation is not visualized. Aortic valve sclerosis/calcification is present, without any evidence of aortic stenosis.  5. The inferior vena cava is normal in size with greater than 50% respiratory variability, suggesting right atrial pressure of 3 mmHg. FINDINGS  Left Ventricle: Left ventricular ejection fraction, by estimation, is 60 to 65%. The left ventricle has normal function. The left ventricle has no regional wall motion abnormalities. The left ventricular internal cavity size was normal in size. There is  mild concentric left ventricular hypertrophy. Left ventricular diastolic parameters were normal. Right Ventricle: The right ventricular size is normal. No increase in right ventricular wall thickness. Right ventricular systolic function is normal. Left Atrium: Left atrial size was normal in size. Right Atrium: Right atrial size was normal in size. Pericardium: There is no evidence of pericardial effusion. Mitral Valve: The mitral valve is normal in structure. Mild mitral annular calcification. Trivial mitral valve regurgitation. No evidence of mitral valve stenosis. Tricuspid Valve: The tricuspid valve is normal in structure. Tricuspid valve regurgitation is not demonstrated. No evidence of tricuspid stenosis. Aortic Valve: The aortic valve is tricuspid. Aortic valve regurgitation is not visualized. Aortic valve  sclerosis/calcification is present, without any evidence of aortic stenosis. Aortic valve mean gradient measures 3.0 mmHg. Aortic valve peak gradient measures 6.0 mmHg. Aortic valve area, by VTI measures 2.15 cm. Pulmonic Valve: The pulmonic valve was not well visualized. Pulmonic valve regurgitation is mild. No evidence of pulmonic stenosis. Aorta: The aortic root is normal in size and structure. Venous: The inferior vena cava is normal in size with greater than 50% respiratory variability, suggesting right atrial pressure of 3 mmHg. IAS/Shunts: No atrial level shunt detected by color flow Doppler.  LEFT VENTRICLE PLAX 2D LVIDd:         3.90 cm   Diastology LVIDs:         2.05 cm   LV e' medial:    6.42 cm/s LV PW:         0.95 cm   LV E/e' medial:  16.5 LV IVS:        0.95 cm   LV e' lateral:   9.68 cm/s LVOT diam:     1.80 cm   LV E/e' lateral: 11.0 LV SV:         59 LV SV Index:   35 LVOT Area:     2.54 cm  RIGHT VENTRICLE RV Basal diam:  3.70 cm RV Mid diam:    2.90 cm RV S prime:     12.70 cm/s TAPSE (M-mode): 2.0 cm LEFT ATRIUM             Index        RIGHT ATRIUM           Index LA Vol (A2C):   49.2 ml 29.27 ml/m  RA Area:     12.00 cm LA Vol (A4C):   34.9 ml 20.76 ml/m  RA Volume:   27.50 ml  16.36 ml/m LA Biplane Vol: 41.9 ml 24.93 ml/m  AORTIC VALVE                    PULMONIC VALVE AV Area (Vmax):    2.21 cm     PR End Diast Vel: 3.93 msec AV Area (Vmean):   2.11 cm AV Area (VTI):  2.15 cm AV Vmax:           122.00 cm/s AV Vmean:          84.200 cm/s AV VTI:            0.274 m AV Peak Grad:      6.0 mmHg AV Mean Grad:      3.0 mmHg LVOT Vmax:         106.00 cm/s LVOT Vmean:        69.700 cm/s LVOT VTI:          0.232 m LVOT/AV VTI ratio: 0.85  AORTA Ao Root diam: 2.80 cm MITRAL VALVE                TRICUSPID VALVE MV Area (PHT): 3.48 cm     TR Peak grad:   24.4 mmHg MV Decel Time: 218 msec     TR Vmax:        247.00 cm/s MV E velocity: 106.00 cm/s MV A velocity: 96.70 cm/s   SHUNTS MV E/A  ratio:  1.10         Systemic VTI:  0.23 m                             Systemic Diam: 1.80 cm Jules Oar MD Electronically signed by Jules Oar MD Signature Date/Time: 09/13/2023/1:22:03 PM    Final    CT CHEST WO CONTRAST Result Date: 09/12/2023 EXAM: CT CHEST WITHOUT CONTRAST 09/12/2023 08:19:18 PM TECHNIQUE: CT of the chest was performed without the administration of intravenous contrast. Multiplanar reformatted images are provided for review. Automated exposure control, iterative reconstruction, and/or weight based adjustment of the mA/kV was utilized to reduce the radiation dose to as low as reasonably achievable. COMPARISON: 08/06/2015 CLINICAL HISTORY: Dyspnea, chronic, chest wall or pleura disease suspected. FINDINGS: MEDIASTINUM: Heart and pericardium are unremarkable. The central airways are clear. Tiny hiatal hernia. LYMPH NODES: No mediastinal, hilar or axillary lymphadenopathy. LUNGS AND PLEURA: 10 x 9 mm nodule at the medial right lung apex (image 22), previously 10 x 8 mm in 2017, grossly unchanged and benign. Mild linear scarring/atelectasis in the left upper lobe and bilateral lower lobes. Mild centrilobular and paraseptal emphysema, upper lung predominant. No pleural effusion or pneumothorax. SOFT TISSUES/BONES: No acute abnormality of the soft tissues. Mild degenerative changes of the mid thoracic spine. UPPER ABDOMEN: Limited images of the upper abdomen demonstrates no acute abnormality. IMPRESSION: 1. No acute findings. 2. Stable 10 mm nodule at the right lung apex, benign. No follow-up is recommended. Electronically signed by: Zadie Herter MD 09/12/2023 08:32 PM EDT RP Workstation: [id number]   MR BRAIN WO CONTRAST Result Date: 09/12/2023 CLINICAL DATA:  Seizure disorder, clinical change EXAM: MRI HEAD WITHOUT CONTRAST TECHNIQUE: Multiplanar, multiecho pulse sequences of the brain and surrounding structures were obtained without intravenous contrast. COMPARISON:  CT head  June 18, 25. MRI head May 13, 2016. FINDINGS: Brain: No acute infarction, hemorrhage, hydrocephalus, extra-axial collection or mass lesion. Cerebral atrophy. Mild to moderate scattered T2/FLAIR hyperintensities the white matter, nonspecific but compatible with chronic microvascular disease. Vascular: Major arterial flow voids are maintained at the skull base. Skull and upper cervical spine: Normal marrow signal. Sinuses/Orbits: Clear sinuses.  No acute orbital findings. Other: No mastoid effusions. IMPRESSION: No evidence of acute intracranial abnormality. Electronically Signed   By: Stevenson Elbe M.D.   On: 09/12/2023 19:23   EEG adult Result Date: 09/12/2023 Yadav, Priyanka  Deanna Expose, MD     09/12/2023  5:08 PM Patient Name: Yarely Bebee MRN: [id number] Epilepsy Attending: Arleene Lack Referring Physician/Provider: Arne Langdon, MD Date: 09/12/2023 Duration: 23.49 mins Patient history: 73 y.o. female with medical history significant of pulmonary sarcoidosis, IBS, HTN, and seizures p/w recurrent seziure. EEG to evaluate for seizure. Level of alertness: Awake AEDs during EEG study: VPA, LEV, LCM Technical aspects: This EEG study was done with scalp electrodes positioned according to the 10-20 International system of electrode placement. Electrical activity was reviewed with band pass filter of 1-70Hz , sensitivity of 7 uV/mm, display speed of 49mm/sec with a 60Hz  notched filter applied as appropriate. EEG data were recorded continuously and digitally stored.  Video monitoring was available and reviewed as appropriate. Description: The posterior dominant rhythm consists of 9 Hz activity of moderate voltage (25-35 uV) seen predominantly in posterior head regions, symmetric and reactive to eye opening and eye closing.  Physiologic photic driving was not seen during photic stimulation.  Hyperventilation was not performed.   IMPRESSION: This study is within normal limits. No seizures or epileptiform discharges  were seen throughout the recording. A normal interictal EEG does not exclude the diagnosis of epilepsy. Arleene Lack   DG Chest Portable 1 View Result Date: 09/12/2023 EXAM: 1 VIEW XRAY OF THE CHEST 09/12/2023 02:40:00 AM COMPARISON: CT chest dated 08/06/2015. CLINICAL HISTORY: AMS. Seizures, altered mental status. FINDINGS: LUNGS AND PLEURA: Increased interstitial markings with mild lower lobe predominance, favoring multifocal pneumonia over interstitial edema. No pleural effusion. No pneumothorax. HEART AND MEDIASTINUM: No acute abnormality of the cardiac and mediastinal silhouettes. BONES AND SOFT TISSUES: No acute osseous abnormality. IMPRESSION: 1. Increased interstitial markings with mild lower lobe predominance, favoring multifocal pneumonia over interstitial edema. Electronically signed by: Zadie Herter MD 09/12/2023 02:45 AM EDT RP Workstation: [id number]   CT Head Wo Contrast Result Date: 09/12/2023 CLINICAL DATA:  Status post seizure. EXAM: CT HEAD WITHOUT CONTRAST TECHNIQUE: Contiguous axial images were obtained from the base of the skull through the vertex without intravenous contrast. RADIATION DOSE REDUCTION: This exam was performed according to the departmental dose-optimization program which includes automated exposure control, adjustment of the mA and/or kV according to patient size and/or use of iterative reconstruction technique. COMPARISON:  November 27, 2021 FINDINGS: Brain: There is generalized cerebral atrophy with widening of the extra-axial spaces and ventricular dilatation. There are areas of decreased attenuation within the white matter tracts of the supratentorial brain, consistent with microvascular disease changes. Vascular: No hyperdense vessel or unexpected calcification. Skull: Normal. Negative for fracture or focal lesion. Sinuses/Orbits: No acute finding. Other: None. IMPRESSION: 1. Generalized cerebral atrophy and microvascular disease changes of the supratentorial  brain. 2. No acute intracranial abnormality. Electronically Signed   By: Virgle Grime M.D.   On: 09/12/2023 00:19     Time coordinating discharge: Over 30 minutes    Faith Homes, MD  [facility] 09/13/2023, 3:05 PM

## 2023-09-13 NOTE — Assessment & Plan Note (Signed)
-   Stable.  No signs of exacerbation.  CT chest negative for acute findings -No indication for antibiotics

## 2023-09-13 NOTE — Hospital Course (Signed)
 Alicia Brennan is a 73 y.o. female with medical history significant of pulmonary sarcoidosis, IBS, HTN, and seizures p/w recurrent seziure.  She is on Keppra  and Depakote  at home.  A great amount of time was spent clarifying her compliance and dosages. Ultimately, she states she has been taking Depakote  750 mg at night and Keppra  1000 mg twice daily.  Her Keppra  is solution and concentration is 100 mg/mL.  She says that she takes 10 mL twice a day.  Unfortunately, fill history at her pharmacy is not reflective of being compliant; furthermore, she also has leftover bottles of Keppra  still at home. Her pharmacy was also called to confirm dispense history and no other pharmacies were noted to be dispensing her Keppra  especially after talking with her daughter and son also.  Nevertheless, chart was evaluated by neurology and she was started on Vimpat  on admission also. Valproate level returned at 55 and Keppra  level returned at 2.4 (very subtherapeutic) further raising concern for noncompliance. With further questioning, her daughter also reports that patient has had progressively more difficulty with onset of cognitive impairment; therefore, she may be inadvertently not taking prescribed amount of Keppra  liquid at home.  Case was discussed with neurology again prior to discharge.  For now, she is continued on Vimpat  in addition to prior home doses of Keppra  and Depakote .  At follow-up, Keppra  level should be repeated and further discussions held regarding whether to continue on Vimpat  or repeat trial with just Keppra  and Depakote  if level is improved along with compliance.  Discussed with her daughter bedside prior to discharge and she states that patient's son is involved with medications and I encouraged them to try and monitor when she is taking her Keppra  specifically to ensure compliance and proper dosing.

## 2023-09-13 NOTE — Assessment & Plan Note (Signed)
>>  ASSESSMENT AND PLAN FOR SEIZURE (HCC) WRITTEN ON 09/13/2023  3:03 PM BY PATSY LENIS, MD  - Strong suspicion that patient may be incorrectly taking Keppra ; she adamantly endorsed taking it as prescribed but she has leftover bottles and pharmacy fill history does not align with her being compliant or taking as prescribed -Discussed with neurology prior to discharge as well; continued on vimpat , keppra , depakote  (no changes in prior home regimen, just adding vimpat  for now) - Referral placed to neurology as she is now relocating to [facility] -Repeat Keppra  and valproic acid  levels in about 1 month

## 2023-09-13 NOTE — Evaluation (Signed)
 Physical Therapy Evaluation Patient Details Name: Alicia Brennan MRN: [id number] DOB: 1950-11-24 Today's Date: 09/13/2023  History of Present Illness  Pt is a 73 yo female admitted to [facility] on 09/11/23 for recurrent seizures. PMH of pulmonary sarcoidosis, IBS, HTN, and seizures  Clinical Impression  Pt presents with admitting diagnosis above. Co-treat with OT. Pt today was able to ambulate short distance with [facility] CGA/Min A as well as perform ADLs at sink with OT. PTA pt reports that she was Mod I with a SPC. Recommend HHPT upon DC. Patient needs to practice stairs next session. PT will continue to follow.         If plan is discharge home, recommend the following: A little help with walking and/or transfers;A little help with bathing/dressing/bathroom;Assistance with cooking/housework;Direct supervision/assist for medications management;Assist for transportation;Help with stairs or ramp for entrance;Supervision due to cognitive status   Can travel by private vehicle        Equipment Recommendations None recommended by PT  Recommendations for Other Services       Functional Status Assessment Patient has had a recent decline in their functional status and demonstrates the ability to make significant improvements in function in a reasonable and predictable amount of time.     Precautions / Restrictions Precautions Precautions: Fall Precaution/Restrictions Comments: seizures Restrictions Weight Bearing Restrictions Per Provider Order: No      Mobility  Bed Mobility Overal bed mobility: Needs Assistance Bed Mobility: Supine to Sit, Sit to Supine     Supine to sit: Contact guard Sit to supine: Min assist   General bed mobility comments: min A sit>sup to help RLE into bed. Able to get to EOB without physical assist + bed rails.    Transfers Overall transfer level: Needs assistance Equipment used: Straight cane Transfers: Sit to/from Stand Sit to Stand: Mod assist            General transfer comment: Mod A to boost into standing.    Ambulation/Gait Ambulation/Gait assistance: Contact guard assist, Min assist Gait Distance (Feet): 15 Feet Assistive device: Straight cane Gait Pattern/deviations: Trunk flexed, Decreased stride length, Step-through pattern Gait velocity: decreased     General Gait Details: Min A to steady initially. Pt with very slow step through pattern. Cues for upright posture. pt would likely benefit much better from RW.  Stairs            Wheelchair Mobility     Tilt Bed    Modified Rankin (Stroke Patients Only)       Balance Overall balance assessment: Needs assistance Sitting-balance support: No upper extremity supported, Feet supported Sitting balance-Leahy Scale: Good     Standing balance support: During functional activity Standing balance-Leahy Scale: Fair Standing balance comment: Took lateral steps EOB no AD                             Pertinent Vitals/Pain Pain Assessment Pain Assessment: No/denies pain    Home Living Family/patient expects to be discharged to:: Private residence Living Arrangements: Children (son and DIL) Available Help at Discharge: Family;Available PRN/intermittently Type of Home: Mobile home Home Access: Stairs to enter Entrance Stairs-Rails: Right;Left;Can reach both Entrance Stairs-Number of Steps: 4   Home Layout: One level Home Equipment: Agricultural consultant (2 wheels);Shower seat;Cane - single point;Hand held shower head;Grab bars - tub/shower Additional Comments: Pt reports she is sometimes at home alone.    Prior Function Prior Level of Function : Independent/Modified Independent  Mobility Comments: Mod I with spc ADLs Comments: ind, does not drive. manages own medications.     Extremity/Trunk Assessment   Upper Extremity Assessment Upper Extremity Assessment: Defer to OT evaluation    Lower Extremity Assessment Lower Extremity  Assessment: Overall WFL for tasks assessed    Cervical / Trunk Assessment Cervical / Trunk Assessment: Kyphotic  Communication   Communication Communication: No apparent difficulties    Cognition Arousal: Alert Behavior During Therapy: WFL for tasks assessed/performed                             Following commands: Intact       Cueing Cueing Techniques: Verbal cues     General Comments General comments (skin integrity, edema, etc.): VSS    Exercises     Assessment/Plan    PT Assessment Patient needs continued PT services  PT Problem List Decreased strength;Decreased range of motion;Decreased activity tolerance;Decreased balance;Decreased mobility;Decreased coordination;Decreased cognition;Decreased knowledge of use of DME;Decreased safety awareness;Decreased knowledge of precautions;Cardiopulmonary status limiting activity       PT Treatment Interventions DME instruction;Gait training;Stair training;Functional mobility training;Therapeutic activities;Therapeutic exercise;Balance training;Neuromuscular re-education;Cognitive remediation;Patient/family education    PT Goals (Current goals can be found in the Care Plan section)  Acute Rehab PT Goals Patient Stated Goal: to go home PT Goal Formulation: With patient Time For Goal Achievement: 09/27/23 Potential to Achieve Goals: Fair    Frequency Min 2X/week     Co-evaluation               AM-PAC PT 6 Clicks Mobility  Outcome Measure Help needed turning from your back to your side while in a flat bed without using bedrails?: A Little Help needed moving from lying on your back to sitting on the side of a flat bed without using bedrails?: A Little Help needed moving to and from a bed to a chair (including a wheelchair)?: A Little Help needed standing up from a chair using your arms (e.g., wheelchair or bedside chair)?: A Little Help needed to walk in hospital room?: A Little Help needed climbing 3-5  steps with a railing? : A Little 6 Click Score: 18    End of Session Equipment Utilized During Treatment: Gait belt Activity Tolerance: Patient tolerated treatment well Patient left: in bed;with call bell/phone within reach;with bed alarm set Nurse Communication: Mobility status PT Visit Diagnosis: Other abnormalities of gait and mobility (R26.89)    Time: 1610-9604 PT Time Calculation (min) (ACUTE ONLY): 34 min   Charges:   PT Evaluation $PT Eval Moderate Complexity: 1 Mod   PT General Charges $$ ACUTE PT VISIT: 1 Visit         Rodgers Clack, PT, DPT Acute Rehab Services [phone]   Ormand Senn 09/13/2023, 3:48 PM

## 2023-09-14 LAB — CULTURE, BLOOD (ROUTINE X 2): Special Requests: ADEQUATE

## 2023-09-17 LAB — CULTURE, BLOOD (ROUTINE X 2)
Culture: NO GROWTH
Culture: NO GROWTH

## 2023-10-06 ENCOUNTER — Other Ambulatory Visit (HOSPITAL_COMMUNITY): Payer: Self-pay

## 2023-12-06 ENCOUNTER — Emergency Department (HOSPITAL_COMMUNITY)

## 2023-12-06 ENCOUNTER — Inpatient Hospital Stay (HOSPITAL_COMMUNITY)
Admission: EM | Admit: 2023-12-06 | Discharge: 2023-12-11 | DRG: 884 | Disposition: A | Discharge status: Skilled Nursing Facility | Attending: Internal Medicine | Admitting: Internal Medicine

## 2023-12-06 ENCOUNTER — Other Ambulatory Visit: Payer: Self-pay

## 2023-12-06 DIAGNOSIS — F0150 Vascular dementia without behavioral disturbance: Principal | ICD-10-CM | POA: Diagnosis present

## 2023-12-06 DIAGNOSIS — K760 Fatty (change of) liver, not elsewhere classified: Secondary | ICD-10-CM | POA: Diagnosis present

## 2023-12-06 DIAGNOSIS — Z6831 Body mass index (BMI) 31.0-31.9, adult: Secondary | ICD-10-CM

## 2023-12-06 DIAGNOSIS — E66811 Obesity, class 1: Secondary | ICD-10-CM | POA: Diagnosis present

## 2023-12-06 DIAGNOSIS — Z79899 Other long term (current) drug therapy: Secondary | ICD-10-CM

## 2023-12-06 DIAGNOSIS — G9340 Encephalopathy, unspecified: Secondary | ICD-10-CM | POA: Diagnosis not present

## 2023-12-06 DIAGNOSIS — Z9049 Acquired absence of other specified parts of digestive tract: Secondary | ICD-10-CM

## 2023-12-06 DIAGNOSIS — R269 Unspecified abnormalities of gait and mobility: Secondary | ICD-10-CM | POA: Diagnosis present

## 2023-12-06 DIAGNOSIS — R296 Repeated falls: Secondary | ICD-10-CM | POA: Diagnosis present

## 2023-12-06 DIAGNOSIS — I890 Lymphedema, not elsewhere classified: Secondary | ICD-10-CM | POA: Diagnosis present

## 2023-12-06 DIAGNOSIS — Z96643 Presence of artificial hip joint, bilateral: Secondary | ICD-10-CM | POA: Diagnosis present

## 2023-12-06 DIAGNOSIS — R7989 Other specified abnormal findings of blood chemistry: Secondary | ICD-10-CM

## 2023-12-06 DIAGNOSIS — D860 Sarcoidosis of lung: Secondary | ICD-10-CM | POA: Diagnosis present

## 2023-12-06 DIAGNOSIS — R531 Weakness: Secondary | ICD-10-CM | POA: Diagnosis present

## 2023-12-06 DIAGNOSIS — Z9181 History of falling: Secondary | ICD-10-CM

## 2023-12-06 DIAGNOSIS — G40909 Epilepsy, unspecified, not intractable, without status epilepticus: Secondary | ICD-10-CM | POA: Diagnosis present

## 2023-12-06 DIAGNOSIS — I10 Essential (primary) hypertension: Secondary | ICD-10-CM | POA: Diagnosis present

## 2023-12-06 DIAGNOSIS — G9341 Metabolic encephalopathy: Secondary | ICD-10-CM | POA: Diagnosis present

## 2023-12-06 DIAGNOSIS — K589 Irritable bowel syndrome without diarrhea: Secondary | ICD-10-CM | POA: Diagnosis present

## 2023-12-06 DIAGNOSIS — R32 Unspecified urinary incontinence: Secondary | ICD-10-CM | POA: Diagnosis present

## 2023-12-06 DIAGNOSIS — R4182 Altered mental status, unspecified: Principal | ICD-10-CM

## 2023-12-06 DIAGNOSIS — W19XXXA Unspecified fall, initial encounter: Secondary | ICD-10-CM | POA: Diagnosis present

## 2023-12-06 LAB — COMPREHENSIVE METABOLIC PANEL WITH GFR
ALT: 21 U/L (ref 0–44)
AST: 41 U/L (ref 15–41)
Albumin: 3.8 g/dL (ref 3.5–5.0)
Alkaline Phosphatase: 113 U/L (ref 38–126)
Anion gap: 12 (ref 5–15)
BUN: 8 mg/dL (ref 8–23)
CO2: 21 mmol/L — ABNORMAL LOW (ref 22–32)
Calcium: 9.4 mg/dL (ref 8.9–10.3)
Chloride: 103 mmol/L (ref 98–111)
Creatinine, Ser: 0.68 mg/dL (ref 0.44–1.00)
GFR, Estimated: >60 mL/min (ref >60)
Glucose, Bld: 103 mg/dL — ABNORMAL HIGH (ref 70–99)
Potassium: 3.9 mmol/L (ref 3.5–5.1)
Sodium: 135 mmol/L (ref 135–145)
Total Bilirubin: 0.7 mg/dL (ref 0.0–1.2)
Total Protein: 8.2 g/dL — ABNORMAL HIGH (ref 6.5–8.1)

## 2023-12-06 LAB — URINALYSIS, ROUTINE W REFLEX MICROSCOPIC
Bilirubin Urine: NEGATIVE
Glucose, UA: NEGATIVE mg/dL
Hgb urine dipstick: NEGATIVE
Ketones, ur: NEGATIVE mg/dL
Leukocytes,Ua: NEGATIVE
Nitrite: NEGATIVE
Protein, ur: NEGATIVE mg/dL
Specific Gravity, Urine: 1.012 (ref 1.005–1.030)
pH: 7 (ref 5.0–8.0)

## 2023-12-06 LAB — CBC WITH DIFFERENTIAL/PLATELET
Abs Immature Granulocytes: 0.01 K/uL (ref 0.00–0.07)
Basophils Absolute: 0 K/uL (ref 0.0–0.1)
Basophils Relative: 1 %
Eosinophils Absolute: 0.2 K/uL (ref 0.0–0.5)
Eosinophils Relative: 5 %
HCT: 44.8 % (ref 36.0–46.0)
Hemoglobin: 14 g/dL (ref 12.0–15.0)
Immature Granulocytes: 0 %
Lymphocytes Relative: 36 %
Lymphs Abs: 1.4 K/uL (ref 0.7–4.0)
MCH: 29.2 pg (ref 26.0–34.0)
MCHC: 31.3 g/dL (ref 30.0–36.0)
MCV: 93.5 fL (ref 80.0–100.0)
Monocytes Absolute: 0.5 K/uL (ref 0.1–1.0)
Monocytes Relative: 12 %
Neutro Abs: 1.8 K/uL (ref 1.7–7.7)
Neutrophils Relative %: 46 %
Platelets: 134 K/uL — ABNORMAL LOW (ref 150–400)
RBC: 4.79 MIL/uL (ref 3.87–5.11)
RDW: 13.9 % (ref 11.5–15.5)
WBC: 3.9 K/uL — ABNORMAL LOW (ref 4.0–10.5)
nRBC: 0 % (ref 0.0–0.2)

## 2023-12-06 LAB — LIPASE, BLOOD: Lipase: 27 U/L (ref 11–51)

## 2023-12-06 LAB — TROPONIN T, HIGH SENSITIVITY
Troponin T High Sensitivity: <15 ng/L (ref 0–19)
Troponin T High Sensitivity: <15 ng/L (ref 0–19)

## 2023-12-06 LAB — PROTIME-INR
INR: 1 (ref 0.8–1.2)
Prothrombin Time: 13.7 s (ref 11.4–15.2)

## 2023-12-06 LAB — AMMONIA: Ammonia: 54 umol/L — ABNORMAL HIGH (ref 9–35)

## 2023-12-06 LAB — VALPROIC ACID LEVEL: Valproic Acid Lvl: 72 ug/mL (ref 50–100)

## 2023-12-06 LAB — PRO BRAIN NATRIURETIC PEPTIDE: Pro Brain Natriuretic Peptide: 64.3 pg/mL (ref <300.0)

## 2023-12-06 MED ORDER — LEVETIRACETAM 100 MG/ML PO SOLN
1000.0000 mg | Freq: Two times a day (BID) | ORAL | Status: DC
Start: 2023-12-07 — End: 2023-12-07
  Administered 2023-12-07 (×2): 1000 mg via ORAL
  Filled 2023-12-06 (×2): qty 10

## 2023-12-06 MED ORDER — ONDANSETRON HCL 4 MG PO TABS: 4.0000 mg | ORAL_TABLET | Freq: Four times a day (QID) | ORAL | Status: DC | PRN

## 2023-12-06 MED ORDER — ORAL CARE MOUTH RINSE: 15.0000 mL | OROMUCOSAL | Status: DC | PRN

## 2023-12-06 MED ORDER — DIVALPROEX SODIUM 250 MG PO DR TAB
750.0000 mg | DELAYED_RELEASE_TABLET | Freq: Every day | ORAL | Status: DC
  Administered 2023-12-07 – 2023-12-10 (×5): 750 mg via ORAL
  Filled 2023-12-06 (×3): qty 3
  Filled 2023-12-06: qty 1
  Filled 2023-12-06: qty 3

## 2023-12-06 MED ORDER — FUROSEMIDE 10 MG/ML IJ SOLN
40.0000 mg | Freq: Once | INTRAMUSCULAR | Status: AC
  Administered 2023-12-06: 40 mg via INTRAVENOUS
  Filled 2023-12-06: qty 4

## 2023-12-06 MED ORDER — ONDANSETRON HCL 4 MG/2ML IJ SOLN: 4.0000 mg | Freq: Four times a day (QID) | INTRAMUSCULAR | Status: DC | PRN

## 2023-12-06 MED ORDER — ACETAMINOPHEN 325 MG PO TABS: 650.0000 mg | ORAL_TABLET | Freq: Four times a day (QID) | ORAL | Status: DC | PRN

## 2023-12-06 MED ORDER — LACTULOSE 10 GM/15ML PO SOLN
10.0000 g | Freq: Every day | ORAL | Status: DC
  Administered 2023-12-06 – 2023-12-11 (×6): 10 g via ORAL
  Filled 2023-12-06 (×6): qty 30

## 2023-12-06 MED ORDER — IRBESARTAN 300 MG PO TABS
300.0000 mg | ORAL_TABLET | Freq: Every day | ORAL | Status: DC
  Administered 2023-12-07 – 2023-12-11 (×5): 300 mg via ORAL
  Filled 2023-12-06 (×5): qty 1

## 2023-12-06 MED ORDER — ENOXAPARIN SODIUM 40 MG/0.4ML IJ SOSY
40.0000 mg | PREFILLED_SYRINGE | INTRAMUSCULAR | Status: DC
  Administered 2023-12-07 – 2023-12-11 (×5): 40 mg via SUBCUTANEOUS
  Filled 2023-12-06 (×5): qty 0.4

## 2023-12-06 MED ORDER — FUROSEMIDE 40 MG PO TABS
40.0000 mg | ORAL_TABLET | Freq: Every day | ORAL | Status: DC
  Administered 2023-12-07 – 2023-12-11 (×5): 40 mg via ORAL
  Filled 2023-12-06 (×5): qty 1

## 2023-12-06 MED ORDER — LACOSAMIDE 50 MG PO TABS
50.0000 mg | ORAL_TABLET | Freq: Two times a day (BID) | ORAL | Status: DC
  Administered 2023-12-07 – 2023-12-11 (×10): 50 mg via ORAL
  Filled 2023-12-06 (×10): qty 1

## 2023-12-06 MED ORDER — ORAL CARE MOUTH RINSE
15.0000 mL | OROMUCOSAL | Status: DC
  Administered 2023-12-06 – 2023-12-08 (×13): 15 mL via OROMUCOSAL

## 2023-12-06 MED ORDER — ACETAMINOPHEN 650 MG RE SUPP: 650.0000 mg | Freq: Four times a day (QID) | RECTAL | Status: DC | PRN

## 2023-12-06 NOTE — H&P (Signed)
 History and Physical    Alicia Brennan [name] DOB: 1951/01/28 DOA: 12/06/2023  PCP: Marya Rosina NOVAK, MD   Patient coming from: Home w/ family Chief Complaint  Patient presents with   Weakness   HPI: Alicia Brennan is a 73 y.o. year old female with PMH of pulmonary sarcoidosis, hypertension, seizure disorder, IBS, history of vascular dementia diagnosed 2 years ago, currently living with her daughter, brought to the ED due to altered mental status.  Patient unable to provide history, as per the chart family member stated she had a 4-day seizure monitoring in August at [facility] and found to have a seizure that lasted for over 30 minutes and started on Lamictal  on Aug 25, she was already on Depakote  Vimpat  and Keppra  and patient has been altered since then.  She has been significantly drowsy, unable to ambulate and having weakness and falls multiple times since August.  Patient was sent to the ED to check ammonia levels and for further workup.  Patient complains of having shaky voice, daughter feels her symptoms are attributed to her Depakote .  Patient has been having tremors and urinary incontinence, bilateral extremity edema.  Family stated that they are trying to get her into rehab at [facility] After neuro meeting yesterday her lamictal  as stopped.  In the ED, vitals fairly stable blood pressure 150s, on room air.  Labs obtained CMP fairly stable bicarb 21 proBNP 64 troponin less than 15 mild leukopenia 3.9 thrombocytopenia 134, INR 1 valproic acid  normal, UA unremarkable Chest x-ray, pelvis x-ray>> no acute finding.  Bilateral total hip arthroplasties CT head/CT C-spine> cerebral atrophy/microvascular disease.  11 mm right apical pulmonary nodule nonemergent CT chest recommended, marked multilevel DJD Ultrasound abdomen limited> fatty liver prior cholecystectomy status Patient was given Lasix  40 mg IV admission was requested  Assessment and plan:   Encephalopathy/altered mental  status-Drowsy/Lethargy Seizure Disorder History of vascular dementia: Unclear etiology likely multifactorial-? If her symptoms are worse since AED addition along with deconditioning, vascualr dementia. She has Nh3 at 53-will add lactulose , check tsh, b12.  Keep on fall precaution, delirium and sezire precaution. She is on multiple AED including Depakote , Keppra  and Vimpat  - Her neuro changed her Depakote  to 750mg  nighttime, her lamotrigine  ( that was low dose per daughter) was stopped on 12/05/22 due to drowsiness. Her seizure are subclinical or starting or quick eye movements like subtle presentations. We will continue rest of AEDs.Valproate level normal, Keppra  and Lamictal  pending. We will admit her to [facility] incase she needs neuro eval or EEG given her history. Patient family has been trying to get her into placement to SNF will likely need one.  Generalized weakness Frequent Falls- mri neg in June for frequent falls: CT brain c spine neg for acute findings. PTOT. May need repeat MRI or neuro eval given AMS or AED toxicity concern  Leg swelling- appear like lymhoedema- chronic- for many years: TTE fairly normal in June, BNP normal. Albumin  is fairly normal.  Check Duplex leg lasix . Start lasix  po in am. She may need to see lymphedema. Hold off Amlodipine . Tried compression stocking and did not help.  Hypertension BP stable. Hold amlodipine . Resume Micardis.  IBS Stable.  Pulmonary sarcoidosis Chest x-ray appears stable.  Class I Obesity w/ Body mass index is 31.67 kg/m.: Will benefit with PCP follow-up, weight loss,healthy lifestyle and outpatient sleep eval if not done.  Mobility: consulted PTOT DVT prophylaxis: enoxaparin  (LOVENOX ) injection 40 mg Start: 12/07/23 0800 Code Status:   Code Status: Full Code  Objective: Vitals last 24 hrs: Vitals:   12/06/23 1322 12/06/23 1334 12/06/23 1735 12/06/23 1740  BP: (!) 155/74   (!) 154/68  Pulse: 69   65  Resp: 16   15  Temp: 98.5  F (36.9 C)  98.2 F (36.8 C)   TempSrc: Oral  Oral   SpO2: 98%   98%  Weight:  83.7 kg    Height:  5' 4 (1.626 m)      Physical Examination: General exam: alert awake, oriented, older than stated age HEENT:Oral mucosa moist, Ear/Nose WNL grossly Respiratory system: Bilaterally clear BS,no use of accessory muscle Cardiovascular system: S1 & S2 +, No JVD. Gastrointestinal system: Abdomen soft,NT,ND, BS+ Nervous System: Alert, awake, moving all extremities,and following commands. Extremities: extremities warm, leg edema ++ chronic appearing and hyperpigmentation. Skin: No rashes,no icterus. MSK: Normal muscle bulk,tone, power   Medications reviewed:  Scheduled Meds:  divalproex   750 mg Oral QHS   [START ON 12/07/2023] enoxaparin  (LOVENOX ) injection  40 mg Subcutaneous Q24H   irbesartan   300 mg Oral Daily   lacosamide   50 mg Oral BID   lactulose   10 g Oral Daily   levETIRAcetam   1,000 mg Oral BID   mouth rinse  15 mL Mouth Rinse Q2H   Continuous Infusions: Diet: Diet Order             Diet regular Room service appropriate? Yes; Fluid consistency: Thin  Diet effective now                     Severity of Illness: The appropriate patient status for this patient is OBSERVATION. Observation status is judged to be reasonable and necessary in order to provide the required intensity of service to ensure the patient's safety. The patient's presenting symptoms, physical exam findings, and initial radiographic and laboratory data in the context of their medical condition is felt to place them at decreased risk for further clinical deterioration. Furthermore, it is anticipated that the patient will be medically stable for discharge from the hospital within 2 midnights of admission.   Family Communication: Admission, patients condition and plan of care including tests being ordered have been discussed with the patient and family- her Daughter, who indicate understanding and agree with  the plan and Code Status.  Consults called:  none  Review of Systems: All systems were reviewed and were negative except as mentioned in HPI above. 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   Hypertension    IBS (irritable bowel syndrome)    IBS (irritable bowel syndrome)    Leg fracture, left    Recurrent headache    Sarcoidosis of lung (HCC)    Seizures (HCC)     Past Surgical History:  Procedure Laterality Date   BILATERAL ANTERIOR TOTAL HIP ARTHROPLASTY Bilateral 11/15/2015   Procedure: BILATERAL ANTERIOR TOTAL HIP ARTHROPLASTY;  Surgeon: Redell Shoals, MD;  Location: [facility] OR;  Service: Orthopedics;  Laterality: Bilateral;   CARPAL TUNNEL RELEASE Bilateral    CHOLECYSTECTOMY     COLONOSCOPY     EYE SURGERY     r/t sarcoidisis   ORIF ELBOW FRACTURE Left    TIBIA FRACTURE SURGERY Left      reports that she has never smoked. She has never used smokeless tobacco. She reports that she does not drink alcohol and does not use drugs.  No Known Allergies  Family History  Problem Relation Age of Onset   Dementia Mother    High blood pressure Father    Dementia Maternal Aunt    Sarcoidosis Son    Seizures Neg Hx      Prior to Admission medications   Medication Sig Start Date End Date Taking? Authorizing Provider  amLODipine  (NORVASC ) 10 MG tablet Take 10 mg by mouth daily.    [provider]  divalproex  (DEPAKOTE ) 250 MG DR tablet Take 1 tablet (250 mg total) by mouth 3 (three) times daily. Patient taking differently: Take 250 mg by mouth at bedtime. 03/25/18   Harris, Abigail, PA-C  FLUoxetine  (PROZAC ) 20 MG capsule Take 1 capsule by mouth daily. 01/26/23   [provider]  lacosamide  (VIMPAT ) 50 MG TABS tablet Take 1 tablet (50 mg total) by mouth 2 (two) times daily. 09/13/23   Patsy Lenis, MD  levETIRAcetam  (KEPPRA ) 100 MG/ML solution  Take 10 mLs (1,000 mg total) by mouth 2 (two) times daily. 09/13/23   Patsy Lenis, MD  loperamide  (IMODIUM  A-D) 2 MG tablet Take 2 mg by mouth daily.    [provider]  telmisartan (MICARDIS) 80 MG tablet Take 80 mg by mouth daily. 12/04/22   [provider]   r   Labs on Admission: I have personally reviewed following labs and imaging studies  CBC: Recent Labs  Lab 12/06/23 1658  WBC 3.9*  NEUTROABS 1.8  HGB 14.0  HCT 44.8  MCV 93.5  PLT 134*   Basic Metabolic Panel: Recent Labs  Lab 12/06/23 1658  NA 135  K 3.9  CL 103  CO2 21*  GLUCOSE 103*  BUN 8  CREATININE 0.68  CALCIUM 9.4   Estimated Creatinine Clearance: 64.6 mL/min (by C-G formula based on SCr of 0.68 mg/dL). Recent Labs  Lab 12/06/23 1658  AST 41  ALT 21  ALKPHOS 113  BILITOT 0.7  PROT 8.2*  ALBUMIN  3.8   Recent Labs  Lab 12/06/23 1658  LIPASE 27   Recent Labs  Lab 12/06/23 1658  AMMONIA 54*   Coagulation Profile: Recent Labs  Lab 12/06/23 2001  INR 1.0   Cardiac Panel (last 3 results) No results for input(s): CKTOTAL, CKMB, TROPONINIHS, RELINDX in the last 72 hours.  BNP (last 3 results) Recent Labs    12/06/23 1658  PROBNP 64.3   HbA1C: No results for input(s): HGBA1C in the last 72 hours. CBG: No results for input(s): GLUCAP in the last 168 hours. Lipid Profile: No results for input(s): CHOL, HDL, LDLCALC, TRIG, CHOLHDL, LDLDIRECT in the last 72 hours. Thyroid Function Tests: No results for input(s): TSH, T4TOTAL, FREET4, T3FREE, THYROIDAB in the last 72 hours. Urine analysis:    Component Value Date/Time   COLORURINE YELLOW 12/06/2023 1829   APPEARANCEUR CLEAR 12/06/2023 1829   APPEARANCEUR Clear 05/17/2013 1525   LABSPEC 1.012 12/06/2023 1829   LABSPEC 1.019 05/17/2013 1525   PHURINE 7.0 12/06/2023 1829   GLUCOSEU NEGATIVE 12/06/2023 1829   GLUCOSEU Negative 05/17/2013 1525   HGBUR NEGATIVE 12/06/2023 1829    BILIRUBINUR NEGATIVE 12/06/2023 1829   BILIRUBINUR Negative 05/17/2013 1525   KETONESUR NEGATIVE 12/06/2023 1829   PROTEINUR NEGATIVE 12/06/2023 1829   NITRITE NEGATIVE 12/06/2023 1829   LEUKOCYTESUR NEGATIVE 12/06/2023 1829   LEUKOCYTESUR Trace 05/17/2013 1525    Radiological Exams on Admission: US  Abdomen Limited RUQ (LIVER/GB) Result Date: 12/06/2023 CLINICAL DATA:  Edema EXAM: ULTRASOUND ABDOMEN LIMITED RIGHT UPPER QUADRANT COMPARISON:  None Available. FINDINGS:  Gallbladder: Prior cholecystectomy Common bile duct: Diameter: Normal caliber, 4 mm Liver: Increased echotexture compatible with fatty infiltration. No focal abnormality or biliary ductal dilatation. Portal vein is patent on color Doppler imaging with normal direction of blood flow towards the liver. Other: None. IMPRESSION: Prior cholecystectomy. Fatty liver. No acute findings. Electronically Signed   By: Franky Crease M.D.   On: 12/06/2023 19:30   CT Cervical Spine Wo Contrast Result Date: 12/06/2023 CLINICAL DATA:  Syncope. EXAM: CT CERVICAL SPINE WITHOUT CONTRAST TECHNIQUE: Multidetector CT imaging of the cervical spine was performed without intravenous contrast. Multiplanar CT image reconstructions were also generated. RADIATION DOSE REDUCTION: This exam was performed according to the departmental dose-optimization program which includes automated exposure control, adjustment of the mA and/or kV according to patient size and/or use of iterative reconstruction technique. COMPARISON:  March 31, 2015 FINDINGS: Alignment: There is 1 mm to 2 mm retrolisthesis of the C3 vertebral body on C4. Approximately 1 mm anterolisthesis of C6 is seen on C7. Skull base and vertebrae: No acute fracture. No primary bone lesion or focal pathologic process. Soft tissues and spinal canal: No prevertebral fluid or swelling. No visible canal hematoma. Disc levels: Marked severity endplate sclerosis, moderate severity anterior osteophyte formation and posterior  bony spurring are seen at the levels of C3-C4, C4-C5, C5-C6 and C6-C7. Marked severity intervertebral disc space narrowing is present at C3-C4, C4-C5, C5-C6 and C6-C7. Bilateral moderate to marked severity multilevel facet joint hypertrophy is noted. Upper chest: An 11 mm pulmonary nodule is seen along the medial aspect of the right apex (axial CT image 84, CT series 9). This measures 7 mm on the prior study. Other: None. IMPRESSION: 1. Marked severity multilevel degenerative changes, as described above. 2. No acute cervical spine fracture. 3. 11 mm right apical pulmonary nodule, as described above. Correlation with dedicated nonemergent chest CT is recommended. Electronically Signed   By: Suzen Dials M.D.   On: 12/06/2023 14:53   CT Head Wo Contrast Result Date: 12/06/2023 CLINICAL DATA:  Syncope. EXAM: CT HEAD WITHOUT CONTRAST TECHNIQUE: Contiguous axial images were obtained from the base of the skull through the vertex without intravenous contrast. RADIATION DOSE REDUCTION: This exam was performed according to the departmental dose-optimization program which includes automated exposure control, adjustment of the mA and/or kV according to patient size and/or use of iterative reconstruction technique. COMPARISON:  September 12, 2023 FINDINGS: Brain: There is generalized cerebral atrophy with widening of the extra-axial spaces and ventricular dilatation. There are areas of decreased attenuation within the white matter tracts of the supratentorial brain, consistent with microvascular disease changes. Vascular: No hyperdense vessel or unexpected calcification. Skull: Normal. Negative for fracture or focal lesion. Sinuses/Orbits: No acute finding. Other: None. IMPRESSION: 1. Generalized cerebral atrophy and microvascular disease changes of the supratentorial brain. 2. No acute intracranial abnormality. Electronically Signed   By: Suzen Dials M.D.   On: 12/06/2023 14:47   DG Chest Portable 1 View Result  Date: 12/06/2023 CLINICAL DATA:  Shortness of breath. EXAM: PORTABLE CHEST 1 VIEW COMPARISON:  09/12/2023. FINDINGS: The heart size and mediastinal contours are within normal limits. Aortic atherosclerosis. No overt pulmonary edema, focal consolidation, pleural effusion, or pneumothorax. No acute osseous abnormality. IMPRESSION: No acute cardiopulmonary findings. Electronically Signed   By: Harrietta Sherry M.D.   On: 12/06/2023 14:30   DG Hip Unilat W or Wo Pelvis 2-3 Views Right Result Date: 12/06/2023 CLINICAL DATA:  Slipped from couch.  Leg swelling. EXAM: DG HIP (WITH OR WITHOUT  PELVIS) 2-3V RIGHT COMPARISON:  11/15/2015. FINDINGS: There is no evidence of acute fracture or dislocation. Status post bilateral total hip arthroplasties with normal alignment. Hardware appears intact. Sacroiliac joints and pubic symphysis appear anatomically aligned. Degenerative changes of the visualized lower lumbar spine. IMPRESSION: 1. No acute osseous abnormality. 2. 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 Electronically Signed   By: Harrietta Sherry M.D.   On: 12/06/2023 14:29   Mennie LAMY MD [facility]  If 7PM-7AM, please contact night-coverage [email or web address]  12/06/2023, 9:14 PM

## 2023-12-06 NOTE — ED Notes (Signed)
 Pt's daughter Justa Hatchell [phone]  has requested to be contacted with any updates regarding her mom

## 2023-12-06 NOTE — ED Provider Notes (Signed)
 [place] EMERGENCY DEPARTMENT AT [facility] Provider Note   CSN: [id number] Arrival date & time: 12/06/23  1250     Patient presents with: Weakness   Alicia Brennan is a 73 y.o. female with a past medical history significant for pulmonary sarcoidosis, IBS, hypertension, seizure disorder who presents to the ED due to altered mental status.  Daughter provided history on the phone.  Granddaughter at bedside.  Daughter note that patient's seizure medications were changed in August and she has been altered ever since.  Daughter notes that patient has been significantly drowsy and unable to ambulate.  She admits to numerous falls over the past few months with worsening after changing her medications in August.  Recently started on Lamictal .  Also on Depakote , Vimpat , and Keppra  for seizures.  Had a 4-day seizure study in August at [facility] which found patient to have a seizure that lasted for over 30 minutes which is when they added Lamictal  to her seizure regimen.  Daughter is unsure when her last seizure was.  Was told by neurology to report to the ED to check her ammonia levels.  Has a history of medication toxicity which daughter believes was from Depakote .  Patient notes her voice has been different over the past few weeks which she describes as shaky.  Also admits to tremors and urinary incontinence.  No fever or chills.  Patient fell today while standing from her recliner on her right hip.  Also admits to bilateral lower extremity edema.  No history of CHF.  No history of blood clots. History of vascular dementia (diagnosed 2 years ago per daughter). Currently lives with daughter. Daughter trying to get patient into rehab ([facility]).  History obtained from patient and past medical records. No interpreter used during encounter.      Prior to Admission medications   Medication Sig Start Date End Date Taking? Authorizing Provider  amLODipine  (NORVASC ) 10 MG tablet Take 10 mg  by mouth daily.    [provider]  divalproex  (DEPAKOTE ) 250 MG DR tablet Take 1 tablet (250 mg total) by mouth 3 (three) times daily. Patient taking differently: Take 250 mg by mouth at bedtime. 03/25/18   Harris, Abigail, PA-C  FLUoxetine  (PROZAC ) 20 MG capsule Take 1 capsule by mouth daily. 01/26/23   [provider]  lacosamide  (VIMPAT ) 50 MG TABS tablet Take 1 tablet (50 mg total) by mouth 2 (two) times daily. 09/13/23   Patsy Lenis, MD  levETIRAcetam  (KEPPRA ) 100 MG/ML solution Take 10 mLs (1,000 mg total) by mouth 2 (two) times daily. 09/13/23   Patsy Lenis, MD  loperamide  (IMODIUM  A-D) 2 MG tablet Take 2 mg by mouth daily.    [provider]  telmisartan (MICARDIS) 80 MG tablet Take 80 mg by mouth daily. 12/04/22   [provider]    Allergies: Patient has no known allergies.    Review of Systems  Constitutional:  Negative for fever.  Respiratory:  Negative for shortness of breath.   Cardiovascular:  Positive for leg swelling. Negative for chest pain.  Gastrointestinal:  Negative for abdominal pain, diarrhea, nausea and vomiting.  Musculoskeletal:  Positive for gait problem.  Neurological:  Positive for dizziness, tremors and seizures.    Updated Vital Signs BP (!) 154/68   Pulse 65   Temp 98.2 F (36.8 C) (Oral)   Resp 15   Ht 5' 4 (1.626 m)   Wt 83.7 kg   SpO2 98%   BMI 31.67 kg/m  Physical Exam Vitals and nursing note reviewed.  Constitutional:      General: She is not in acute distress.    Appearance: She is not ill-appearing.  HENT:     Head: Normocephalic.  Eyes:     Pupils: Pupils are equal, round, and reactive to light.  Cardiovascular:     Rate and Rhythm: Normal rate and regular rhythm.     Pulses: Normal pulses.     Heart sounds: Normal heart sounds. No murmur heard.    No friction rub. No gallop.  Pulmonary:     Effort: Pulmonary effort is normal.     Breath sounds: Normal breath sounds.  Abdominal:      General: Abdomen is flat. There is distension.     Palpations: Abdomen is soft.     Tenderness: There is no abdominal tenderness. There is no guarding or rebound.  Musculoskeletal:        General: Normal range of motion.     Cervical back: Neck supple.     Comments: 2+ pitting edema bilaterally.   Skin:    General: Skin is warm and dry.  Neurological:     General: No focal deficit present.     Mental Status: She is alert.     Comments: Normal speech, no facial droop No pronator drift Equal grip strength  Psychiatric:        Mood and Affect: Mood normal.        Behavior: Behavior normal.     (all labs ordered are listed, but only abnormal results are displayed) Labs Reviewed  CBC WITH DIFFERENTIAL/PLATELET - Abnormal; Notable for the following components:      Result Value   WBC 3.9 (*)    Platelets 134 (*)    All other components within normal limits  COMPREHENSIVE METABOLIC PANEL WITH GFR - Abnormal; Notable for the following components:   CO2 21 (*)    Glucose, Bld 103 (*)    Total Protein 8.2 (*)    All other components within normal limits  AMMONIA - Abnormal; Notable for the following components:   Ammonia 54 (*)    All other components within normal limits  LIPASE, BLOOD  URINALYSIS, ROUTINE W REFLEX MICROSCOPIC  PRO BRAIN NATRIURETIC PEPTIDE  VALPROIC ACID  LEVEL  PROTIME-INR  LEVETIRACETAM  LEVEL  LAMOTRIGINE  LEVEL  TROPONIN T, HIGH SENSITIVITY  TROPONIN T, HIGH SENSITIVITY    EKG: None  Radiology: US  Abdomen Limited RUQ (LIVER/GB) Result Date: 12/06/2023 CLINICAL DATA:  Edema EXAM: ULTRASOUND ABDOMEN LIMITED RIGHT UPPER QUADRANT COMPARISON:  None Available. FINDINGS: Gallbladder: Prior cholecystectomy Common bile duct: Diameter: Normal caliber, 4 mm Liver: Increased echotexture compatible with fatty infiltration. No focal abnormality or biliary ductal dilatation. Portal vein is patent on color Doppler imaging with normal direction of blood flow towards the  liver. Other: None. IMPRESSION: Prior cholecystectomy. Fatty liver. No acute findings. Electronically Signed   By: Franky Crease M.D.   On: 12/06/2023 19:30   CT Cervical Spine Wo Contrast Result Date: 12/06/2023 CLINICAL DATA:  Syncope. EXAM: CT CERVICAL SPINE WITHOUT CONTRAST TECHNIQUE: Multidetector CT imaging of the cervical spine was performed without intravenous contrast. Multiplanar CT image reconstructions were also generated. RADIATION DOSE REDUCTION: This exam was performed according to the departmental dose-optimization program which includes automated exposure control, adjustment of the mA and/or kV according to patient size and/or use of iterative reconstruction technique. COMPARISON:  March 31, 2015 FINDINGS: Alignment: There is 1 mm to 2 mm retrolisthesis of the C3 vertebral  body on C4. Approximately 1 mm anterolisthesis of C6 is seen on C7. Skull base and vertebrae: No acute fracture. No primary bone lesion or focal pathologic process. Soft tissues and spinal canal: No prevertebral fluid or swelling. No visible canal hematoma. Disc levels: Marked severity endplate sclerosis, moderate severity anterior osteophyte formation and posterior bony spurring are seen at the levels of C3-C4, C4-C5, C5-C6 and C6-C7. Marked severity intervertebral disc space narrowing is present at C3-C4, C4-C5, C5-C6 and C6-C7. Bilateral moderate to marked severity multilevel facet joint hypertrophy is noted. Upper chest: An 11 mm pulmonary nodule is seen along the medial aspect of the right apex (axial CT image 84, CT series 9). This measures 7 mm on the prior study. Other: None. IMPRESSION: 1. Marked severity multilevel degenerative changes, as described above. 2. No acute cervical spine fracture. 3. 11 mm right apical pulmonary nodule, as described above. Correlation with dedicated nonemergent chest CT is recommended. Electronically Signed   By: Suzen Dials M.D.   On: 12/06/2023 14:53   CT Head Wo Contrast Result  Date: 12/06/2023 CLINICAL DATA:  Syncope. EXAM: CT HEAD WITHOUT CONTRAST TECHNIQUE: Contiguous axial images were obtained from the base of the skull through the vertex without intravenous contrast. RADIATION DOSE REDUCTION: This exam was performed according to the departmental dose-optimization program which includes automated exposure control, adjustment of the mA and/or kV according to patient size and/or use of iterative reconstruction technique. COMPARISON:  September 12, 2023 FINDINGS: Brain: There is generalized cerebral atrophy with widening of the extra-axial spaces and ventricular dilatation. There are areas of decreased attenuation within the white matter tracts of the supratentorial brain, consistent with microvascular disease changes. Vascular: No hyperdense vessel or unexpected calcification. Skull: Normal. Negative for fracture or focal lesion. Sinuses/Orbits: No acute finding. Other: None. IMPRESSION: 1. Generalized cerebral atrophy and microvascular disease changes of the supratentorial brain. 2. No acute intracranial abnormality. Electronically Signed   By: Suzen Dials M.D.   On: 12/06/2023 14:47   DG Chest Portable 1 View Result Date: 12/06/2023 CLINICAL DATA:  Shortness of breath. EXAM: PORTABLE CHEST 1 VIEW COMPARISON:  09/12/2023. FINDINGS: The heart size and mediastinal contours are within normal limits. Aortic atherosclerosis. No overt pulmonary edema, focal consolidation, pleural effusion, or pneumothorax. No acute osseous abnormality. IMPRESSION: No acute cardiopulmonary findings. Electronically Signed   By: Harrietta Sherry M.D.   On: 12/06/2023 14:30   DG Hip Unilat W or Wo Pelvis 2-3 Views Right Result Date: 12/06/2023 CLINICAL DATA:  Slipped from couch.  Leg swelling. EXAM: DG HIP (WITH OR WITHOUT PELVIS) 2-3V RIGHT COMPARISON:  11/15/2015. FINDINGS: There is no evidence of acute fracture or dislocation. Status post bilateral total hip arthroplasties with normal alignment.  Hardware appears intact. Sacroiliac joints and pubic symphysis appear anatomically aligned. Degenerative changes of the visualized lower lumbar spine. IMPRESSION: 1. No acute osseous abnormality. 2. 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 Electronically Signed   By: Harrietta Sherry M.D.   On: 12/06/2023 14:29     Procedures   Medications Ordered in the ED  furosemide  (LASIX ) injection 40 mg (40 mg Intravenous Given 12/06/23 1958)    Clinical Course as of 12/06/23 2051  Thu Dec 06, 2023  1547 Inquired about lab delay. RN at bedside drawing labs now [CA]  1650 Patient hard stick. IV consult placed [CA]  1714 WBC(!): 3.9 [CA]  1714 Platelets(!): 134 [CA]    Clinical Course User Index [CA] Lorelle Aleck BROCKS, PA-C  Medical Decision Making Amount and/or Complexity of Data Reviewed Independent Historian: caregiver External Data Reviewed: notes. Labs: ordered. Decision-making details documented in ED Course. Radiology: ordered and independent interpretation performed. Decision-making details documented in ED Course.  Risk Prescription drug management. Decision regarding hospitalization.   This patient presents to the ED for concern of AMS, this involves an extensive number of treatment options, and is a complaint that carries with it a high risk of complications and morbidity.  The differential diagnosis includes electrolyte abnormality, infection, CVA, medication side effect, etc  73 year old female presents to the ED due to frequent falls, drowsiness, lower extremity edema, and difficulties with gait.  Daughter provided history over the phone.  History of seizures.  Recently started on Lamictal  in August in addition to Keppra , Vimpat , and Depakote .  Since starting Lamictal  patient has been significantly drowsy.  Daughter notes she is unable to ambulate which she typically can at baseline.  Has had frequent falls.  Clemens today on her  right hip.  Upon arrival patient afebrile, not tachycardic or hypoxic.  Patient does have 2+ pitting edema bilaterally.  Does have some abdominal distention.  Equal grip strength.  No pronator drift.  Routine labs ordered.  Added ammonia per neurology recommendations per daughter.  Medication levels ordered to rule out toxicity.  CT head.  X-ray right hip to rule out bony fracture. IV lasix  given due to fluid status on exam.  CBC with leukopenia 3.9.  Normal hemoglobin.  CMP reassuring.  Normal renal function.  Lipase normal.  Low suspicion for pancreatitis.  UA negative for signs of infection.  BNP normal.  Low suspicion for CHF.  CT head personally reviewed and interpreted which demonstrates generalized cerebral atrophy and microvascular disease changes.  No acute abnormalities.  CT cervical spine negative.  No bony fractures. CXR negative.  Hip x-ray negative for any bony fractures.SABRA  UA negative for signs of infection.  Elevated ammonia which could possibly causing patient's AMS?  Given patient's AMS and inability to ambulate patient will require admission for further evaluation. Discussed with Dr. Simon who evaluated patient at bedside and agrees with assessment and plan.  8:46 PM Discussed with Dr. Christobal with [facility] who agrees to admit patient.  Co morbidities that complicate the patient evaluation  Sarcoidosis, IBS, seizures Cardiac Monitoring: / EKG:  The patient was maintained on a cardiac monitor.  I personally viewed and interpreted the cardiac monitored which showed an underlying rhythm of: NSR  Social Determinants of Health:  Elderly >65  Test / Admission - Considered:  Patient will require admission for further evaluation     Final diagnoses:  Altered mental status, unspecified altered mental status type  Gait abnormality  Increased ammonia level    ED Discharge Orders     None          Dorsey Charette C, PA-C 12/06/23 2051    Patsey Lot, MD 12/08/23  1130

## 2023-12-06 NOTE — ED Notes (Signed)
 Pt has a purwick from day shift eptied canister 1000 ml

## 2023-12-06 NOTE — Hospital Course (Addendum)
 Alicia Brennan is a 73 y.o. year old female with PMH of pulmonary sarcoidosis, hypertension, seizure disorder, IBS, history of vascular dementia diagnosed 2 years ago, currently living with her daughter, brought to the ED due to altered mental status.  Patient unable to provide history, as per the chart family member stated she had a 4-day seizure monitoring in August at [facility] and found to have a seizure that lasted for over 30 minutes and started on Lamictal  on Aug 25, she was already on Depakote  Vimpat  and Keppra  and patient has been altered since then.  She has been significantly drowsy, unable to ambulate and having weakness and falls multiple times since August.  Patient was sent to the ED to check ammonia levels and for further workup.  Patient complains of having shaky voice, daughter feels her symptoms are attributed to her Depakote .  Patient has been having tremors and urinary incontinence, bilateral extremity edema.  Family stated that they are trying to get her into rehab at [facility] After neuro meeting yesterday her lamictal  as stopped.  In the ED, vitals fairly stable blood pressure 150s, on room air.  Labs obtained CMP fairly stable bicarb 21 proBNP 64 troponin less than 15 mild leukopenia 3.9 thrombocytopenia 134, INR 1 valproic acid  normal, UA unremarkable Chest x-ray, pelvis x-ray>> no acute finding.  Bilateral total hip arthroplasties CT head/CT C-spine> cerebral atrophy/microvascular disease.  11 mm right apical pulmonary nodule nonemergent CT chest recommended, marked multilevel DJD Ultrasound abdomen limited> fatty liver prior cholecystectomy status Patient was given Lasix  40 mg IV admission was requested

## 2023-12-06 NOTE — ED Triage Notes (Signed)
 BIB [facility] slip from couch, leg swelling, feeling weaker than normal, HTN 196/92, (-) stroke screening per EMS, wants to find rehab. VS 196/92 78HR 293CBG 96.6T.

## 2023-12-07 ENCOUNTER — Observation Stay (HOSPITAL_COMMUNITY)

## 2023-12-07 ENCOUNTER — Encounter (HOSPITAL_COMMUNITY): Payer: Self-pay | Admitting: Internal Medicine

## 2023-12-07 DIAGNOSIS — T4275XA Adverse effect of unspecified antiepileptic and sedative-hypnotic drugs, initial encounter: Secondary | ICD-10-CM

## 2023-12-07 DIAGNOSIS — M7989 Other specified soft tissue disorders: Secondary | ICD-10-CM | POA: Diagnosis not present

## 2023-12-07 DIAGNOSIS — G9340 Encephalopathy, unspecified: Secondary | ICD-10-CM | POA: Diagnosis present

## 2023-12-07 DIAGNOSIS — G40909 Epilepsy, unspecified, not intractable, without status epilepticus: Secondary | ICD-10-CM | POA: Diagnosis not present

## 2023-12-07 LAB — CBC
HCT: 43.4 % (ref 36.0–46.0)
Hemoglobin: 13.5 g/dL (ref 12.0–15.0)
MCH: 29.3 pg (ref 26.0–34.0)
MCHC: 31.1 g/dL (ref 30.0–36.0)
MCV: 94.1 fL (ref 80.0–100.0)
Platelets: 128 K/uL — ABNORMAL LOW (ref 150–400)
RBC: 4.61 MIL/uL (ref 3.87–5.11)
RDW: 14.1 % (ref 11.5–15.5)
WBC: 3.6 K/uL — ABNORMAL LOW (ref 4.0–10.5)
nRBC: 0 % (ref 0.0–0.2)

## 2023-12-07 LAB — BASIC METABOLIC PANEL WITH GFR
Anion gap: 11 (ref 5–15)
BUN: 10 mg/dL (ref 8–23)
CO2: 21 mmol/L — ABNORMAL LOW (ref 22–32)
Calcium: 9 mg/dL (ref 8.9–10.3)
Chloride: 103 mmol/L (ref 98–111)
Creatinine, Ser: 0.83 mg/dL (ref 0.44–1.00)
GFR, Estimated: >60 mL/min (ref >60)
Glucose, Bld: 93 mg/dL (ref 70–99)
Potassium: 4.3 mmol/L (ref 3.5–5.1)
Sodium: 135 mmol/L (ref 135–145)

## 2023-12-07 LAB — AMMONIA: Ammonia: 54 umol/L — ABNORMAL HIGH (ref 9–35)

## 2023-12-07 MED ORDER — LEVETIRACETAM 500 MG PO TABS: 1000.0000 mg | ORAL_TABLET | Freq: Two times a day (BID) | ORAL | Status: DC

## 2023-12-07 MED ORDER — SENNOSIDES-DOCUSATE SODIUM 8.6-50 MG PO TABS
1.0000 | ORAL_TABLET | Freq: Two times a day (BID) | ORAL | Status: DC
  Administered 2023-12-07 – 2023-12-11 (×9): 1 via ORAL
  Filled 2023-12-07 (×9): qty 1

## 2023-12-07 MED ORDER — POLYETHYLENE GLYCOL 3350 17 G PO PACK
17.0000 g | PACK | Freq: Every day | ORAL | Status: DC
  Administered 2023-12-07 – 2023-12-11 (×3): 17 g via ORAL
  Filled 2023-12-07 (×4): qty 1

## 2023-12-07 MED ORDER — LEVETIRACETAM 500 MG PO TABS
500.0000 mg | ORAL_TABLET | Freq: Two times a day (BID) | ORAL | Status: DC
  Administered 2023-12-07 – 2023-12-11 (×8): 500 mg via ORAL
  Filled 2023-12-07 (×8): qty 1

## 2023-12-07 NOTE — Consult Note (Signed)
 NEUROLOGY CONSULT NOTE   Date of service: December 07, 2023 Patient Name: Alicia Brennan MRN:  [id number] DOB:  [birth year] Chief Complaint: encephalopathy, worsening after recent EMU admission and AED adjustments Requesting Provider: Jillian Buttery, MD  History of Present Illness  Alicia Brennan is a 73 y.o. female with hx of  with a past medical history significant for pulmonary sarcoidosis, IBS, hypertension, seizure disorder who presented to the [facility] ED due to altered mental status.  Daughter provided history on the phone.  Granddaughter at bedside.  Daughter note that patient's seizure medications were changed in August and she has been altered ever since.  Daughter notes that patient has been significantly drowsy and unable to ambulate.  She admits to numerous falls over the past few months with worsening after changing her medications in August.   Had a 4-day seizure study in August at [facility] which found patient to have a seizure that lasted for over 30 minutes which is when they added Lamictal  to her seizure regimen.  Daughter is unsure when her last seizure was.    Has a history of medication toxicity which daughter believes was from Depakote .  Patient notes her voice has been different over the past few weeks which she describes as shaky.  Also admits to tremors and urinary incontinence.  No fever or chills.  Patient fell today while standing from her recliner on her right hip.  Also admits to bilateral lower extremity edema.  No history of CHF.  No history of blood clots. History of vascular dementia (diagnosed 2 years ago per daughter).  Current AEDs:  Depakote  250mg  three times daily - patient taking extended release of Depakote  750mg  by mouth at bedtime. Vimpat  50mg  twice daily. Keppra  500mg  BID. Lamictal  25mg  BID - not taking  According to most recent [facility] visit in July 2025, seizure onset was in 2016 with semiology consisting of staring spells and automatisms and  one GTC. Previous MRI and EEG were normal.    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   Hypertension    IBS (irritable bowel syndrome)    IBS (irritable bowel syndrome)    Leg fracture, left    Recurrent headache    Sarcoidosis of lung (HCC)    Seizures (HCC)     Past Surgical History:  Procedure Laterality Date   BILATERAL ANTERIOR TOTAL HIP ARTHROPLASTY Bilateral 11/15/2015   Procedure: BILATERAL ANTERIOR TOTAL HIP ARTHROPLASTY;  Surgeon: Redell Shoals, MD;  Location: [facility] OR;  Service: Orthopedics;  Laterality: Bilateral;   CARPAL TUNNEL RELEASE Bilateral    CHOLECYSTECTOMY     COLONOSCOPY     EYE SURGERY     r/t sarcoidisis   ORIF ELBOW FRACTURE Left    TIBIA FRACTURE SURGERY Left     Family History: Family History  Problem Relation Age of Onset   Dementia Mother    High blood pressure Father    Dementia Maternal Aunt    Sarcoidosis Son    Seizures Neg Hx     Social History  reports that she has never smoked. She has never used smokeless tobacco. She reports that she does not drink alcohol and does not use drugs.  No Known Allergies  Medications   Current Facility-Administered Medications:    acetaminophen  (TYLENOL ) tablet 650 mg, 650 mg, Oral, Q6H PRN **OR** acetaminophen  (  TYLENOL ) suppository 650 mg, 650 mg, Rectal, Q6H PRN, Kc, Ramesh, MD   divalproex  (DEPAKOTE ) DR tablet 750 mg, 750 mg, Oral, QHS, Kc, Ramesh, MD, 750 mg at 12/07/23 0058   enoxaparin  (LOVENOX ) injection 40 mg, 40 mg, Subcutaneous, Q24H, Kc, Ramesh, MD, 40 mg at 12/07/23 1105   furosemide  (LASIX ) tablet 40 mg, 40 mg, Oral, Daily, Kc, Ramesh, MD, 40 mg at 12/07/23 1054   irbesartan  (AVAPRO ) tablet 300 mg, 300 mg, Oral, Daily, Kc, Ramesh, MD, 300 mg at 12/07/23 1054   lacosamide  (VIMPAT ) tablet 50 mg, 50 mg, Oral, BID, Kc, Ramesh, MD, 50 mg at 12/07/23 1054    lactulose  (CHRONULAC ) 10 GM/15ML solution 10 g, 10 g, Oral, Daily, Kc, Ramesh, MD, 10 g at 12/07/23 1053   levETIRAcetam  (KEPPRA ) 100 MG/ML solution 1,000 mg, 1,000 mg, Oral, BID, Kc, Ramesh, MD, 1,000 mg at 12/07/23 1055   ondansetron  (ZOFRAN ) tablet 4 mg, 4 mg, Oral, Q6H PRN **OR** ondansetron  (ZOFRAN ) injection 4 mg, 4 mg, Intravenous, Q6H PRN, Kc, Ramesh, MD   Oral care mouth rinse, 15 mL, Mouth Rinse, Q2H, Kc, Ramesh, MD, 15 mL at 12/07/23 1203   Oral care mouth rinse, 15 mL, Mouth Rinse, PRN, Kc, Ramesh, MD   polyethylene glycol (MIRALAX  / GLYCOLAX ) packet 17 g, 17 g, Oral, Daily, Adhikari, Amrit, MD   senna-docusate (Senokot-S) tablet 1 tablet, 1 tablet, Oral, BID, Adhikari, Amrit, MD  Current Outpatient Medications:    amLODipine  (NORVASC ) 10 MG tablet, Take 10 mg by mouth daily., Disp: , Rfl:    divalproex  (DEPAKOTE ) 250 MG DR tablet, Take 1 tablet (250 mg total) by mouth 3 (three) times daily. (Patient taking differently: Take 750 mg by mouth at bedtime.), Disp: 90 tablet, Rfl: 0   lacosamide  (VIMPAT ) 50 MG TABS tablet, Take 1 tablet (50 mg total) by mouth 2 (two) times daily., Disp: 180 tablet, Rfl: 0   levETIRAcetam  (KEPPRA ) 500 MG tablet, Take 500 mg by mouth 2 (two) times daily., Disp: , Rfl:    loperamide  (IMODIUM  A-D) 2 MG tablet, Take 2 mg by mouth daily as needed for diarrhea or loose stools., Disp: , Rfl:    NAYZILAM  5 MG/0.1ML SOLN, GIVE ONE SPRAY INTO 1 NOSTRIL. IF NO RESPONSE IN 10 MINUTES, MAY GIVE SECOND SPRAY IN OTHER NOSTRIL. DO NOT GIVE SECOND DOSE IF PATIENT HAS TROUBLE BREATHING OR EXCESSIVE SEDATION, MAX 2 DOSES/SEIZURE EPISODE, 1 EPISODE EVERY 3 DAYS OR 5 EPISODES/MONTH Nasal for 6 Days, Disp: , Rfl:    telmisartan (MICARDIS) 80 MG tablet, Take 80 mg by mouth daily., Disp: , Rfl:    FLUoxetine  (PROZAC ) 20 MG capsule, Take 1 capsule by mouth daily. (Patient not taking: Reported on 12/07/2023), Disp: , Rfl:    lamoTRIgine  (LAMICTAL ) 25 MG tablet, Take 25 mg by mouth 2  (two) times daily. (Patient not taking: Reported on 12/07/2023), Disp: , Rfl:    levETIRAcetam  (KEPPRA ) 100 MG/ML solution, Take 10 mLs (1,000 mg total) by mouth 2 (two) times daily. (Patient not taking: Reported on 12/07/2023), Disp: 1800 mL, Rfl: 0  Vitals   Vitals:   12/07/23 0245 12/07/23 0500 12/07/23 0806 12/07/23 1158  BP: 129/69 139/79 (!) 142/71 (!) 143/71  Pulse: 61 61 60 64  Resp: 16 20 18 20   Temp: 98.2 F (36.8 C) 98.5 F (36.9 C) 97.6 F (36.4 C) 97.9 F (36.6 C)  TempSrc:   Oral Oral  SpO2: 95% 95% 97% 96%  Weight:      Height:  Body mass index is 31.67 kg/m.   Physical Exam   General: Laying comfortably in bed; in no acute distress.  HENT: Normal oropharynx and mucosa. Normal external appearance of ears and nose.  Neck: Supple, no pain or tenderness  CV: No JVD. No peripheral edema.  Pulmonary: Symmetric Chest rise. Normal respiratory effort.  Abdomen: Soft to touch, non-tender.  Ext: No cyanosis, or deformity. Has BL lower extremity pitting edema. Skin: No rash. Normal palpation of skin.   Musculoskeletal: Normal digits and nails by inspection. No clubbing.   Neurologic Examination  Mental status/Cognition: Alert, oriented to self, place, but not to month and year, good attention.  Speech/language: Fluent, comprehension intact, object naming intact, repetition intact.  Cranial nerves:   CN II Pupils equal and reactive to light, no VF deficits    CN III,IV,VI EOM intact, no gaze preference or deviation, no nystagmus    CN V normal sensation in V1, V2, and V3 segments bilaterally    CN VII no asymmetry, no nasolabial fold flattening    CN VIII normal hearing to speech    CN IX & X normal palatal elevation, no uvular deviation    CN XI 5/5 head turn and 5/5 shoulder shrug bilaterally    CN XII midline tongue protrusion    Motor:  Muscle bulk: normal, tone normal, pronator drift none tremor none Mvmt Root Nerve  Muscle Right Left Comments  SA C5/6  Ax Deltoid 4+ 4+   EF C5/6 Mc Biceps 5 5   EE C6/7/8 Rad Triceps 5 5   WF C6/7 Med FCR     WE C7/8 PIN ECU     F Ab C8/T1 U ADM/FDI 5 5   HF L1/2/3 Fem Illopsoas 4+ 4-   KE L2/3/4 Fem Quad 5 5   DF L4/5 D Peron Tib Ant 5 5   PF S1/2 Tibial Grc/Sol 5 5    Sensation:  Light touch Intact throughout   Pin prick    Temperature    Vibration   Proprioception    Coordination/Complex Motor:  - Finger to Nose intact BL - Heel to shin unable to do due to body habitus, BL lower ext edema. - Rapid alternating movement are normal - Gait: deferred.   Labs/Imaging/Neurodiagnostic studies   CBC:  Recent Labs  Lab 2023-12-20 1658 12/07/23 0500  WBC 3.9* 3.6*  NEUTROABS 1.8  --   HGB 14.0 13.5  HCT 44.8 43.4  MCV 93.5 94.1  PLT 134* 128*   Basic Metabolic Panel:  Lab Results  Component Value Date   NA 135 12/07/2023   K 4.3 12/07/2023   CO2 21 (L) 12/07/2023   GLUCOSE 93 12/07/2023   BUN 10 12/07/2023   CREATININE 0.83 12/07/2023   CALCIUM 9.0 12/07/2023   GFRNONAA >60 12/07/2023   GFRAA >60 08/16/2019   Lipid Panel: No results found for: LDLCALC HgbA1c: No results found for: HGBA1C Urine Drug Screen:     Component Value Date/Time   LABOPIA NONE DETECTED 04/25/2015 0825   COCAINSCRNUR NONE DETECTED 04/25/2015 0825   LABBENZ NONE DETECTED 04/25/2015 0825   AMPHETMU NONE DETECTED 04/25/2015 0825   THCU NONE DETECTED 04/25/2015 0825   LABBARB NONE DETECTED 04/25/2015 0825    Alcohol Level     Component Value Date/Time   ETH <15 09/11/2023 2335   INR  Lab Results  Component Value Date   INR 1.0 2023-12-20   APTT  Lab Results  Component Value Date   APTT 30  11/27/2021   AED levels:  Lab Results  Component Value Date   LAMOTRIGINE  1.7 (L) 12/06/2023   LEVETIRACETA 2.4 (L) 09/11/2023    CT Head without contrast(Personally reviewed): 1. Generalized cerebral atrophy and microvascular disease changes of the supratentorial brain. 2. No acute intracranial  abnormality.  ASSESSMENT   Alicia Brennan is a 73 y.o. female with hx of  with a past medical history significant for pulmonary sarcoidosis, IBS, hypertension, seizure disorder who presented to the [facility] ED due to altered mental status.  She has decline over the last couple months but more recently had an EMU admission and had a 30-minute seizure and Lamictal  was added to her home regimen close to discharge.  Also recently, her Depakote  dose was increased.  But reports worsening encephalopathy.  Her seizures, based on records from [facility] admission are fairly subtle with oral automatisms and gaze deviation.  Daughter is worried that Depakote  is causing her somnolence and increased falls. Plan is to put her up on LTM EEG and see if we can adjust AEDs while she is on LTM EEG given her seizures are very subtle.  It may be that her increased encephalopathy could be secondary to her having subclinical/subtle seizure versus somnolence from AEDs.  RECOMMENDATIONS  -LTM EEG starting in AM. -Continue home Keppra  500 mg twice daily, continue home Depakote  250 mg every 8 hours and Vimpat  50 mg twice daily. - Adjustment of AED will be done once patient is on LTM EEG.  Will start by cutting down Depakote  tomorrow a.m. to 250 mg twice daily. ______________________________________________________________________    Bonney Jorene Last, NP [facility]

## 2023-12-07 NOTE — Progress Notes (Addendum)
 PROGRESS NOTE  Alicia Brennan  [name] DOB: [birth year] DOA: 12/06/2023 PCP: Marya Rosina NOVAK, MD   Brief Narrative: Patient is a 50 female with history of pulmonary sarcoidosis, hypertension, seizure disorder, IBS, vascular dementia diagnosed about 2 years ago, currently living with daughter who was brought to the emergency department for evaluation of altered mentation.  She was recently seen at [facility] for suspected seizure and was placed on Depakote , Vimpat , Keppra .  She has then been altered since then.  Reported being drowsy, unable to ambulate, weak, multiple falls.  Daughter felt that her symptoms are attributed to Depakote .  Also noted to have tremors, urinary incontinence, bilateral lower extremity edema.   She was hemodynamically stable.  Chest x-ray/pelvic x-ray did not show any acute finding.  CT head/cervical spine showed cerebral atrophy/microvascular disease.  Ultrasound of the abdomen showed fatty liver disease.  Patient admitted for workup of metabolic encephalopathy.  Neurology consulted.Patient being transferred to [facility] for further work up  Assessment & Plan:  Principal Problem:   Encephalopathy  Acute metabolic encephalopathy/history of vascular dementia: Multifactorial.  But as per family, her symptoms worsened after addition of AEDs. She was somnolent and weak. She was on Depakote , Keppra , Vimpat .  Lamotrigine  was recently stopped.  Possibility of nonconvulsive seizures.  Valproate level normal.  Lamotrigine , Keppra  level pending Case discussed with neurology, Dr. Vanessa.  Currently she is completely alert oriented this morning.  Continue current AEDs.  Further recommendation as per neurology. Has mildly elevated ammonia level, continue lactulose .  Follow-up TSH, vitamin B12 level  Generalized weakness/frequent falls: MRI was done in June which has been negative for acute findings.  CT head, CT cervical spine negative for acute findings.  Frequently falling at home in a  weak.  Could be associated with AED toxicity  as well.  PT/OT consulted.   Family were trying to get her to rehab.  Will consult TOC  Bilateral lower extremity swelling: Appears like lymphedema.  Chronic.  Echocardiogram done in June showed preserved EF, BNP normal, albumin  is fairly normal.  Started on oral Lasix .  May need to follow-up with lymphedema clinic.  Amlodipine  discontinued.  Follow-up venous Doppler  History of hypertension: Currently blood pressure stable.  Amlodipine  on hold.  Continue Micardis  History of pulmonary sarcoidosis: Chest x-ray appears stable.She has been on room air  Obesity: BMI 31.6         DVT prophylaxis:enoxaparin  (LOVENOX ) injection 40 mg Start: 12/07/23 0800     Code Status: Full Code  Family Communication: Called and discussed with daughter Landscape architect on phone today  Patient status:Obs  Patient is from :Home  Anticipated discharge un:[facility] vs SNF  Estimated DC date:1-2 days   Consultants: Neurology  Procedures:None  Antimicrobials:  Anti-infectives (From admission, onward)    None       Subjective: Patient seen and examined at the bedside today.  Hemodynamically stable.  Very comfortable this morning.  Lying on bed.  Alert and oriented times 4.  No new complaints.    Objective: Vitals:   12/07/23 0030 12/07/23 0245 12/07/23 0500 12/07/23 0806  BP: 138/61 129/69 139/79 (!) 142/71  Pulse: 61 61 61 60  Resp: 16 16 20 18   Temp:  98.2 F (36.8 C) 98.5 F (36.9 C) 97.6 F (36.4 C)  TempSrc:    Oral  SpO2: 93% 95% 95% 97%  Weight:      Height:        Intake/Output Summary (Last 24 hours) at 12/07/2023 9178 Last data filed  at 12/07/2023 0000 Gross per 24 hour  Intake --  Output 2000 ml  Net -2000 ml   Filed Weights   12/06/23 1334  Weight: 83.7 kg    Examination:  General exam: Overall comfortable, not in distress, lying on bed HEENT: PERRL Respiratory system:  no wheezes or crackles  Cardiovascular system: S1 &  S2 heard, RRR.  Gastrointestinal system: Abdomen is nondistended, soft and nontender. Central nervous system: Alert and oriented Extremities: Bilateral lower extremity lymphedema, no clubbing ,no cyanosis Skin: No rashes, no ulcers,no icterus     Data Reviewed: I have personally reviewed following labs and imaging studies  CBC: Recent Labs  Lab 12/06/23 1658 12/07/23 0500  WBC 3.9* 3.6*  NEUTROABS 1.8  --   HGB 14.0 13.5  HCT 44.8 43.4  MCV 93.5 94.1  PLT 134* 128*   Basic Metabolic Panel: Recent Labs  Lab 12/06/23 1658 12/07/23 0500  NA 135 135  K 3.9 4.3  CL 103 103  CO2 21* 21*  GLUCOSE 103* 93  BUN 8 10  CREATININE 0.68 0.83  CALCIUM 9.4 9.0     No results found for this or any previous visit (from the past 240 hours).   Radiology Studies: US  Abdomen Limited RUQ (LIVER/GB) Result Date: 12/06/2023 CLINICAL DATA:  Edema EXAM: ULTRASOUND ABDOMEN LIMITED RIGHT UPPER QUADRANT COMPARISON:  None Available. FINDINGS: Gallbladder: Prior cholecystectomy Common bile duct: Diameter: Normal caliber, 4 mm Liver: Increased echotexture compatible with fatty infiltration. No focal abnormality or biliary ductal dilatation. Portal vein is patent on color Doppler imaging with normal direction of blood flow towards the liver. Other: None. IMPRESSION: Prior cholecystectomy. Fatty liver. No acute findings. Electronically Signed   By: Franky Crease M.D.   On: 12/06/2023 19:30   CT Cervical Spine Wo Contrast Result Date: 12/06/2023 CLINICAL DATA:  Syncope. EXAM: CT CERVICAL SPINE WITHOUT CONTRAST TECHNIQUE: Multidetector CT imaging of the cervical spine was performed without intravenous contrast. Multiplanar CT image reconstructions were also generated. RADIATION DOSE REDUCTION: This exam was performed according to the departmental dose-optimization program which includes automated exposure control, adjustment of the mA and/or kV according to patient size and/or use of iterative reconstruction  technique. COMPARISON:  March 31, 2015 FINDINGS: Alignment: There is 1 mm to 2 mm retrolisthesis of the C3 vertebral body on C4. Approximately 1 mm anterolisthesis of C6 is seen on C7. Skull base and vertebrae: No acute fracture. No primary bone lesion or focal pathologic process. Soft tissues and spinal canal: No prevertebral fluid or swelling. No visible canal hematoma. Disc levels: Marked severity endplate sclerosis, moderate severity anterior osteophyte formation and posterior bony spurring are seen at the levels of C3-C4, C4-C5, C5-C6 and C6-C7. Marked severity intervertebral disc space narrowing is present at C3-C4, C4-C5, C5-C6 and C6-C7. Bilateral moderate to marked severity multilevel facet joint hypertrophy is noted. Upper chest: An 11 mm pulmonary nodule is seen along the medial aspect of the right apex (axial CT image 84, CT series 9). This measures 7 mm on the prior study. Other: None. IMPRESSION: 1. Marked severity multilevel degenerative changes, as described above. 2. No acute cervical spine fracture. 3. 11 mm right apical pulmonary nodule, as described above. Correlation with dedicated nonemergent chest CT is recommended. Electronically Signed   By: Suzen Dials M.D.   On: 12/06/2023 14:53   CT Head Wo Contrast Result Date: 12/06/2023 CLINICAL DATA:  Syncope. EXAM: CT HEAD WITHOUT CONTRAST TECHNIQUE: Contiguous axial images were obtained from the base of the skull  through the vertex without intravenous contrast. RADIATION DOSE REDUCTION: This exam was performed according to the departmental dose-optimization program which includes automated exposure control, adjustment of the mA and/or kV according to patient size and/or use of iterative reconstruction technique. COMPARISON:  September 12, 2023 FINDINGS: Brain: There is generalized cerebral atrophy with widening of the extra-axial spaces and ventricular dilatation. There are areas of decreased attenuation within the white matter tracts of the  supratentorial brain, consistent with microvascular disease changes. Vascular: No hyperdense vessel or unexpected calcification. Skull: Normal. Negative for fracture or focal lesion. Sinuses/Orbits: No acute finding. Other: None. IMPRESSION: 1. Generalized cerebral atrophy and microvascular disease changes of the supratentorial brain. 2. No acute intracranial abnormality. Electronically Signed   By: Suzen Dials M.D.   On: 12/06/2023 14:47   DG Chest Portable 1 View Result Date: 12/06/2023 CLINICAL DATA:  Shortness of breath. EXAM: PORTABLE CHEST 1 VIEW COMPARISON:  09/12/2023. FINDINGS: The heart size and mediastinal contours are within normal limits. Aortic atherosclerosis. No overt pulmonary edema, focal consolidation, pleural effusion, or pneumothorax. No acute osseous abnormality. IMPRESSION: No acute cardiopulmonary findings. Electronically Signed   By: Harrietta Sherry M.D.   On: 12/06/2023 14:30   DG Hip Unilat W or Wo Pelvis 2-3 Views Right Result Date: 12/06/2023 CLINICAL DATA:  Slipped from couch.  Leg swelling. EXAM: DG HIP (WITH OR WITHOUT PELVIS) 2-3V RIGHT COMPARISON:  11/15/2015. FINDINGS: There is no evidence of acute fracture or dislocation. Status post bilateral total hip arthroplasties with normal alignment. Hardware appears intact. Sacroiliac joints and pubic symphysis appear anatomically aligned. Degenerative changes of the visualized lower lumbar spine. IMPRESSION: 1. No acute osseous abnormality. 2. 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 Electronically Signed   By: Harrietta Sherry M.D.   On: 12/06/2023 14:29    Scheduled Meds:  divalproex   750 mg Oral QHS   enoxaparin  (LOVENOX ) injection  40 mg Subcutaneous Q24H   furosemide   40 mg Oral Daily   irbesartan   300 mg Oral Daily   lacosamide   50 mg Oral BID   lactulose   10 g Oral Daily   levETIRAcetam   1,000 mg Oral BID   mouth rinse  15 mL Mouth Rinse Q2H   Continuous Infusions:   LOS: 0 days    Ivonne Mustache, MD [facility] P9/02/2024, 8:21 AM

## 2023-12-07 NOTE — Plan of Care (Signed)

## 2023-12-07 NOTE — Progress Notes (Addendum)
 PT Cancellation Note  Patient Details Name: Shaunta Oncale MRN: [id number] DOB: 1950/10/06   Cancelled Treatment:    Reason Eval/Treat Not Completed: Other (comment). Noted BLE swelling with venous doppler ordered; not resulted as of 15:20. Notified via secure chat from nurse that pt being [facility]. Will continue to follow for PT eval as schedule permits.   Metta Ave PT, DPT 12/07/23, 3:21 PM

## 2023-12-07 NOTE — Progress Notes (Signed)
 OT Cancellation Note  Patient Details Name: Alicia Brennan MRN: [id number] DOB: [birth year]   Cancelled Treatment:    Reason Eval/Treat Not Completed: Medical issues which prohibited therapy  OT eval pending, awaited results of LE Dopplers due to B LE edema, results not in and patient being transferred to [facility]. OT will continue to follow acutely for eval as patient medically able and schedule allows.   Usbaldo Pannone OT/L Acute Rehabilitation Department  [phone]   12/07/2023, 3:31 PM

## 2023-12-07 NOTE — Progress Notes (Signed)
 VASCULAR LAB    Bilateral lower extremity venous duplex has been performed.  See CV proc for preliminary results.   Treyton Slimp, RVT 12/07/2023, 10:44 AM

## 2023-12-07 NOTE — ED Notes (Signed)
 Called [facility] to transport patient to [facility]

## 2023-12-08 ENCOUNTER — Observation Stay (HOSPITAL_COMMUNITY)

## 2023-12-08 DIAGNOSIS — E66811 Obesity, class 1: Secondary | ICD-10-CM | POA: Diagnosis present

## 2023-12-08 DIAGNOSIS — Z8659 Personal history of other mental and behavioral disorders: Secondary | ICD-10-CM

## 2023-12-08 DIAGNOSIS — R600 Localized edema: Secondary | ICD-10-CM

## 2023-12-08 DIAGNOSIS — K760 Fatty (change of) liver, not elsewhere classified: Secondary | ICD-10-CM | POA: Diagnosis present

## 2023-12-08 DIAGNOSIS — G9340 Encephalopathy, unspecified: Secondary | ICD-10-CM | POA: Diagnosis not present

## 2023-12-08 DIAGNOSIS — R269 Unspecified abnormalities of gait and mobility: Secondary | ICD-10-CM | POA: Diagnosis present

## 2023-12-08 DIAGNOSIS — R7989 Other specified abnormal findings of blood chemistry: Secondary | ICD-10-CM | POA: Diagnosis not present

## 2023-12-08 DIAGNOSIS — R4182 Altered mental status, unspecified: Secondary | ICD-10-CM | POA: Diagnosis not present

## 2023-12-08 DIAGNOSIS — K589 Irritable bowel syndrome without diarrhea: Secondary | ICD-10-CM | POA: Diagnosis present

## 2023-12-08 DIAGNOSIS — F0150 Vascular dementia without behavioral disturbance: Secondary | ICD-10-CM | POA: Diagnosis present

## 2023-12-08 DIAGNOSIS — G9341 Metabolic encephalopathy: Secondary | ICD-10-CM | POA: Diagnosis present

## 2023-12-08 DIAGNOSIS — R531 Weakness: Secondary | ICD-10-CM | POA: Diagnosis present

## 2023-12-08 DIAGNOSIS — Z9049 Acquired absence of other specified parts of digestive tract: Secondary | ICD-10-CM | POA: Diagnosis not present

## 2023-12-08 DIAGNOSIS — Z79899 Other long term (current) drug therapy: Secondary | ICD-10-CM | POA: Diagnosis not present

## 2023-12-08 DIAGNOSIS — G40909 Epilepsy, unspecified, not intractable, without status epilepticus: Secondary | ICD-10-CM | POA: Diagnosis present

## 2023-12-08 DIAGNOSIS — R5381 Other malaise: Secondary | ICD-10-CM

## 2023-12-08 DIAGNOSIS — R32 Unspecified urinary incontinence: Secondary | ICD-10-CM | POA: Diagnosis present

## 2023-12-08 DIAGNOSIS — I10 Essential (primary) hypertension: Secondary | ICD-10-CM | POA: Diagnosis present

## 2023-12-08 DIAGNOSIS — W19XXXA Unspecified fall, initial encounter: Secondary | ICD-10-CM | POA: Diagnosis present

## 2023-12-08 DIAGNOSIS — R296 Repeated falls: Secondary | ICD-10-CM | POA: Diagnosis present

## 2023-12-08 DIAGNOSIS — Z96643 Presence of artificial hip joint, bilateral: Secondary | ICD-10-CM | POA: Diagnosis present

## 2023-12-08 DIAGNOSIS — Z9181 History of falling: Secondary | ICD-10-CM | POA: Diagnosis not present

## 2023-12-08 DIAGNOSIS — I890 Lymphedema, not elsewhere classified: Secondary | ICD-10-CM | POA: Diagnosis present

## 2023-12-08 DIAGNOSIS — R569 Unspecified convulsions: Secondary | ICD-10-CM | POA: Diagnosis not present

## 2023-12-08 DIAGNOSIS — D860 Sarcoidosis of lung: Secondary | ICD-10-CM | POA: Diagnosis present

## 2023-12-08 DIAGNOSIS — Z6831 Body mass index (BMI) 31.0-31.9, adult: Secondary | ICD-10-CM | POA: Diagnosis not present

## 2023-12-08 MED ORDER — ORAL CARE MOUTH RINSE
15.0000 mL | Freq: Three times a day (TID) | OROMUCOSAL | Status: DC
  Administered 2023-12-08 – 2023-12-11 (×10): 15 mL via OROMUCOSAL

## 2023-12-08 NOTE — Progress Notes (Signed)
 LTM VIDEO EEG hooked up and running - no initial skin breakdown - push button tested - [facility] is monitoring.

## 2023-12-08 NOTE — Evaluation (Signed)
 Physical Therapy Evaluation Patient Details Name: Alicia Brennan MRN: [id number] DOB: February 01, 1951 Today's Date: 12/08/2023  History of Present Illness  Pt is a 73 yo female presented to [facility] on 12/06/23 with AMS and LE swelling. Dopplers neg for DVT.  PMH: pulmonary sarcoidosis, IBS, HTN, dementia, and seizures  Clinical Impression  Pt admitted with above diagnosis. Pt from home with family where she was ambulatory with rollator but has had falls and is alone during the day while family is at work. On eval pt with LE stiffness in knees and hips such that once assisted to EOB with mod A she was unable to bend knees under her for proper push through floor to stand. She had posterior bias throughout in sitting with hips and knees both tending to extend. Worked on fwd wt shifting and balance activities in sitting. Will need +2 assist for OOB. Patient will benefit from continued inpatient follow up therapy, <3 hours/day.  Pt currently with functional limitations due to the deficits listed below (see PT Problem List). Pt will benefit from acute skilled PT to increase their independence and safety with mobility to allow discharge.           If plan is discharge home, recommend the following: Two people to help with walking and/or transfers;A lot of help with bathing/dressing/bathroom;Assistance with cooking/housework;Assist for transportation;Help with stairs or ramp for entrance   Can travel by private vehicle   No    Equipment Recommendations Deitra lift (if going home)  Recommendations for Other Services       Functional Status Assessment Patient has had a recent decline in their functional status and demonstrates the ability to make significant improvements in function in a reasonable and predictable amount of time.     Precautions / Restrictions Precautions Precautions: Fall Recall of Precautions/Restrictions: Impaired Precaution/Restrictions Comments: hx of vascular  dementia Restrictions Weight Bearing Restrictions Per Provider Order: No      Mobility  Bed Mobility Overal bed mobility: Needs Assistance Bed Mobility: Sit to Supine, Rolling, Sidelying to Sit Rolling: Mod assist, Used rails Sidelying to sit: Mod assist   Sit to supine: Max assist   General bed mobility comments: mod A to roll and raise trunk, max A to assist with trunk control and return BLEs to bed.    Transfers Overall transfer level: Needs assistance Equipment used: Rolling walker (2 wheels) Transfers: Sit to/from Stand Sit to Stand: Max assist           General transfer comment: unable to come fully to standing in part due to pt being unable to slide feet under her due to knee tighness and thus feet slding fwd when trying to stand    Ambulation/Gait               General Gait Details: unable  Stairs            Wheelchair Mobility     Tilt Bed    Modified Rankin (Stroke Patients Only)       Balance Overall balance assessment: Needs assistance Sitting-balance support: Single extremity supported, Feet unsupported Sitting balance-Leahy Scale: Poor Sitting balance - Comments: posterior lean Postural control: Posterior lean Standing balance support: Bilateral upper extremity supported, Reliant on assistive device for balance Standing balance-Leahy Scale: Poor Standing balance comment: max A, posterior lean               High Level Balance Comments: worked on fwd wt shifting in sitting with reaching activities  Pertinent Vitals/Pain Pain Assessment Pain Assessment: Faces Faces Pain Scale: Hurts little more Pain Location: BLEs, R>L Pain Descriptors / Indicators: Aching, Discomfort, Sore (swollen) Pain Intervention(s): Limited activity within patient's tolerance, Monitored during session    Home Living Family/patient expects to be discharged to:: Private residence Living Arrangements: Children (son and DIL) Available  Help at Discharge: Family;Available PRN/intermittently (both work) Type of Home: Mobile home Home Access: Stairs to enter Entrance Stairs-Rails: Right;Left;Can reach both Entrance Stairs-Number of Steps: 4   Home Layout: One level Home Equipment: Agricultural consultant (2 wheels);Shower seat;Cane - single point;Hand held shower head;Grab bars - tub/shower;Rollator (4 wheels) Additional Comments: Pt reports she is at home alone all day    Prior Function Prior Level of Function : Independent/Modified Independent             Mobility Comments: Mod I with SPC or rollator but has had falls, son has camera in her room to be able to see when she's up ADLs Comments: Does not drive. manages her own medications.     Extremity/Trunk Assessment   Upper Extremity Assessment Upper Extremity Assessment: Defer to OT evaluation    Lower Extremity Assessment Lower Extremity Assessment: Generalized weakness;RLE deficits/detail;LLE deficits/detail RLE Deficits / Details: LE edema, hip flex 2/5, B knees very tight, cannot flex >90 deg, knee ext 3/5 RLE Sensation: decreased proprioception RLE Coordination: decreased gross motor LLE Deficits / Details: grossly the same as RLE LLE Sensation: decreased proprioception LLE Coordination: decreased gross motor    Cervical / Trunk Assessment Cervical / Trunk Assessment: Kyphotic  Communication   Communication Communication: No apparent difficulties    Cognition Arousal: Alert Behavior During Therapy: WFL for tasks assessed/performed   PT - Cognitive impairments: History of cognitive impairments                       PT - Cognition Comments: delayed responses, repeats self, cannot give an accurate timeline but can relay things that have happened in past month Following commands: Impaired Following commands impaired: Follows one step commands with increased time     Cueing Cueing Techniques: Verbal cues, Gestural cues, Tactile cues      General Comments General comments (skin integrity, edema, etc.): continuous EEG running dueing eval. Pt relayed dizziness after attempted standing. Pt fatigued after session    Exercises     Assessment/Plan    PT Assessment Patient needs continued PT services  PT Problem List Decreased strength;Decreased range of motion;Decreased activity tolerance;Decreased balance;Decreased mobility;Decreased coordination;Obesity;Impaired sensation;Decreased cognition       PT Treatment Interventions DME instruction;Gait training;Functional mobility training;Therapeutic activities;Therapeutic exercise;Balance training;Neuromuscular re-education;Cognitive remediation;Patient/family education    PT Goals (Current goals can be found in the Care Plan section)  Acute Rehab PT Goals Patient Stated Goal: get stronger PT Goal Formulation: With patient Time For Goal Achievement: 12/22/23 Potential to Achieve Goals: Fair    Frequency Min 2X/week     Co-evaluation               AM-PAC PT 6 Clicks Mobility  Outcome Measure Help needed turning from your back to your side while in a flat bed without using bedrails?: A Lot Help needed moving from lying on your back to sitting on the side of a flat bed without using bedrails?: A Lot Help needed moving to and from a bed to a chair (including a wheelchair)?: Total Help needed standing up from a chair using your arms (e.g., wheelchair or bedside chair)?: Total Help needed  to walk in hospital room?: Total Help needed climbing 3-5 steps with a railing? : Total 6 Click Score: 8    End of Session   Activity Tolerance: Patient limited by fatigue Patient left: in bed;with call bell/phone within reach;with bed alarm set;with nursing/sitter in room Nurse Communication: Mobility status PT Visit Diagnosis: Muscle weakness (generalized) (M62.81);History of falling (Z91.81);Difficulty in walking, not elsewhere classified (R26.2);Dizziness and giddiness (R42)     Time: 8772-8748 PT Time Calculation (min) (ACUTE ONLY): 24 min   Charges:   PT Evaluation $PT Eval Moderate Complexity: 1 Mod PT Treatments $Therapeutic Activity: 8-22 mins PT General Charges $$ ACUTE PT VISIT: 1 Visit         Richerd Lipoma, PT  Acute Rehab Services Secure chat preferred Office [phone]   Richerd CROME Chigozie Basaldua 12/08/2023, 1:21 PM

## 2023-12-08 NOTE — Care Management Obs Status (Signed)
 MEDICARE OBSERVATION STATUS NOTIFICATION   Patient Details  Name: Alicia Brennan MRN: [id number] Date of Birth: October 15, 1950   Medicare Observation Status Notification Given:  Yes    Carletha Spruce, RN 12/08/2023, 8:10 AM

## 2023-12-08 NOTE — Plan of Care (Signed)
°  Problem: Health Behavior/Discharge Planning: Goal: Ability to manage health-related needs will improve Outcome: Progressing   Problem: Clinical Measurements: Goal: Ability to maintain clinical measurements within normal limits will improve Outcome: Progressing Goal: Will remain free from infection Outcome: Progressing Goal: Diagnostic test results will improve Outcome: Progressing   Problem: Education: Goal: Knowledge of General Education information will improve Description: Including pain rating scale, medication(s)/side effects and non-pharmacologic comfort measures Outcome: Progressing

## 2023-12-08 NOTE — Progress Notes (Signed)
 NEUROLOGY CONSULT FOLLOW UP NOTE   Date of service: December 08, 2023 Patient Name: Alicia Brennan MRN:  [id number] DOB:  10/23/1950  Interval Hx/subjective   Awake, eating breakfast. No complaints. Interactive.  Vitals   Vitals:   12/07/23 1956 12/08/23 0016 12/08/23 0414 12/08/23 0900  BP: (!) 157/78 136/70 (!) 153/69 (!) 147/67  Pulse: 71 61 62 (!) 59  Resp: 18 15 18    Temp: 98 F (36.7 C) 98.7 F (37.1 C) 97.8 F (36.6 C) 98.2 F (36.8 C)  TempSrc: Oral Oral Oral Oral  SpO2: 99% 95% 100% 98%  Weight:      Height:         Body mass index is 31.67 kg/m.  Physical Exam   General: Laying comfortably in bed; in no acute distress.  HENT: Normal oropharynx and mucosa. Normal external appearance of ears and nose.  Neck: Supple, no pain or tenderness  CV: No JVD. No peripheral edema.  Pulmonary: Symmetric Chest rise. Normal respiratory effort.  Abdomen: Soft to touch, non-tender.  Ext: No cyanosis, edema, or deformity  Skin: No rash. Normal palpation of skin.   Musculoskeletal: Normal digits and nails by inspection. No clubbing.   Neurologic Examination  Mental status/Cognition: Alert, oriented to self, place, month and year, good attention.  Speech/language: Fluent, comprehension intact, object naming intact, repetition intact.  Cranial nerves:   CN II Pupils equal and reactive to light, no VF deficits    CN III,IV,VI EOM intact, no gaze preference or deviation, no nystagmus    CN V normal sensation in V1, V2, and V3 segments bilaterally    CN VII no asymmetry, no nasolabial fold flattening    CN VIII normal hearing to speech    CN IX & X normal palatal elevation, no uvular deviation    CN XI 5/5 head turn and 5/5 shoulder shrug bilaterally    CN XII midline tongue protrusion    Motor:  Muscle bulk: normal, tone normal, pronator drift none tremor none Mvmt Root Nerve  Muscle Right Left Comments  SA C5/6 Ax Deltoid 4+ 4+   EF C5/6 Mc Biceps 5 5   EE C6/7/8 Rad  Triceps 5 5   WF C6/7 Med FCR     WE C7/8 PIN ECU     F Ab C8/T1 U ADM/FDI 5 5   HF L1/2/3 Fem Illopsoas 4+ 4-   KE L2/3/4 Fem Quad 5 5   DF L4/5 D Peron Tib Ant 5 5   PF S1/2 Tibial Grc/Sol 5 5    Sensation:  Light touch Intact throughout   Pin prick    Temperature    Vibration   Proprioception    Coordination/Complex Motor:  - Finger to Nose intact BL - Heel to shin intact BL - Rapid alternating movement are normal - Gait: deferred.  Medications  Current Facility-Administered Medications:    acetaminophen  (TYLENOL ) tablet 650 mg, 650 mg, Oral, Q6H PRN **OR** acetaminophen  (TYLENOL ) suppository 650 mg, 650 mg, Rectal, Q6H PRN, Kc, Ramesh, MD   divalproex  (DEPAKOTE ) DR tablet 750 mg, 750 mg, Oral, QHS, Kc, Ramesh, MD, 750 mg at 12/07/23 2058   enoxaparin  (LOVENOX ) injection 40 mg, 40 mg, Subcutaneous, Q24H, Kc, Ramesh, MD, 40 mg at 12/08/23 0957   furosemide  (LASIX ) tablet 40 mg, 40 mg, Oral, Daily, Kc, Ramesh, MD, 40 mg at 12/08/23 9041   irbesartan  (AVAPRO ) tablet 300 mg, 300 mg, Oral, Daily, Kc, Ramesh, MD, 300 mg at 12/08/23 0957   lacosamide  (VIMPAT )  tablet 50 mg, 50 mg, Oral, BID, Kc, Ramesh, MD, 50 mg at 12/08/23 0957   lactulose  (CHRONULAC ) 10 GM/15ML solution 10 g, 10 g, Oral, Daily, Kc, Ramesh, MD, 10 g at 12/08/23 9041   levETIRAcetam  (KEPPRA ) tablet 500 mg, 500 mg, Oral, BID, Kandon Hosking, MD, 500 mg at 12/08/23 0957   ondansetron  (ZOFRAN ) tablet 4 mg, 4 mg, Oral, Q6H PRN **OR** ondansetron  (ZOFRAN ) injection 4 mg, 4 mg, Intravenous, Q6H PRN, Kc, Ramesh, MD   Oral care mouth rinse, 15 mL, Mouth Rinse, PRN, Kc, Ramesh, MD   Oral care mouth rinse, 15 mL, Mouth Rinse, TID AC & HS, Odell Celinda Balo, MD   polyethylene glycol (MIRALAX  / GLYCOLAX ) packet 17 g, 17 g, Oral, Daily, Adhikari, Amrit, MD, 17 g at 12/08/23 0958   senna-docusate (Senokot-S) tablet 1 tablet, 1 tablet, Oral, BID, Jillian Buttery, MD, 1 tablet at 12/08/23 0957  Labs and Diagnostic Imaging    CBC:  Recent Labs  Lab 12/06/23 1658 12/07/23 0500  WBC 3.9* 3.6*  NEUTROABS 1.8  --   HGB 14.0 13.5  HCT 44.8 43.4  MCV 93.5 94.1  PLT 134* 128*    Basic Metabolic Panel:  Lab Results  Component Value Date   NA 135 12/07/2023   K 4.3 12/07/2023   CO2 21 (L) 12/07/2023   GLUCOSE 93 12/07/2023   BUN 10 12/07/2023   CREATININE 0.83 12/07/2023   CALCIUM 9.0 12/07/2023   GFRNONAA >60 12/07/2023   GFRAA >60 08/16/2019   Lipid Panel: No results found for: LDLCALC HgbA1c: No results found for: HGBA1C Urine Drug Screen:     Component Value Date/Time   LABOPIA NONE DETECTED 04/25/2015 0825   COCAINSCRNUR NONE DETECTED 04/25/2015 0825   LABBENZ NONE DETECTED 04/25/2015 0825   AMPHETMU NONE DETECTED 04/25/2015 0825   THCU NONE DETECTED 04/25/2015 0825   LABBARB NONE DETECTED 04/25/2015 0825    Alcohol Level     Component Value Date/Time   ETH <15 09/11/2023 2335   INR  Lab Results  Component Value Date   INR 1.0 12/06/2023   APTT  Lab Results  Component Value Date   APTT 30 11/27/2021   AED levels:  Lab Results  Component Value Date   LAMOTRIGINE  1.7 (L) 12/06/2023   LEVETIRACETA 2.4 (L) 09/11/2023    No new imaging to review.  Assessment   Alicia Brennan is a 73 y.o. female with hx of  with a past medical history significant for pulmonary sarcoidosis, IBS, hypertension, seizure disorder who presented to the [facility] due to altered mental status.  She has decline over the last couple months but more recently had an EMU admission and had a 30-minute seizure and Lamictal  was added to her home regimen close to discharge.  Also recently, her Depakote  dose was increased.  But reports worsening encephalopathy.   Her seizures, based on records from [facility] admission are fairly subtle with oral automatisms and gaze deviation.   Daughter is worried that Depakote  is causing her somnolence and increased falls. Initial plan to put her up on LTM EEG and see if we  can adjust AEDs while she is on LTM EEG. However, pt is awake, interactive. She is disoriented to month and year and this may be baseline for her given her hx of dementia.  As for her lower ext weakness, I see mild hip flexion weakness BL and the pattern seems most consistent with deconditioning. Edema in BL lower extrremities could be contributing to this  too.  Recommendations  - discontinue LTM EEG - continue Keppra  mg twice daily, continue home Depakote  250 mg every 8 hours and Vimpat  50 mg twice daily. - Daughter asking if patient is a candidate for inpatient rehab. Will defer to primary team. - Defer management of BL lower ext edema to primary team. This was dicussed with Dr. Odell Castor over secure chat.  ______________________________________________________________________   Signed, Ellouise Mari, MD [facility]

## 2023-12-08 NOTE — Evaluation (Signed)
 Occupational Therapy Evaluation Patient Details Name: Alicia Brennan MRN: [id number] DOB: [birth year] Today's Date: 12/08/2023   History of Present Illness   Pt is a 73 yo female presented to [facility] on 12/06/23 with AMS and LE swelling. Dopplers neg for DVT.  PMH: pulmonary sarcoidosis, IBS, HTN, dementia, and seizures     Clinical Impressions Pt admitted for above, PTA pt lived with her son and daughter-in law and reports being ind with her ADLs and ambulating mod I with SPC. Pt currently presenting with listed deficits, has a significant amount of BLE edema and noted to be generally weak. Pt needing max A to assist into standing but unable to gather balance for gait as she was unable to correct posterior hip posture. Pt also needing total A to CGA for ADLs. OT to continue following pt acutely to progress pt as able. Patient will benefit from continued inpatient follow up therapy, <3 hours/day    If plan is discharge home, recommend the following:   A lot of help with walking and/or transfers;A lot of help with bathing/dressing/bathroom;Assistance with cooking/housework;Supervision due to cognitive status;Direct supervision/assist for financial management;Direct supervision/assist for medications management;Help with stairs or ramp for entrance     Functional Status Assessment   Patient has had a recent decline in their functional status and demonstrates the ability to make significant improvements in function in a reasonable and predictable amount of time.     Equipment Recommendations   None recommended by OT (pt family has rec DME per notes)     Recommendations for Other Services         Precautions/Restrictions   Precautions Precautions: Fall Recall of Precautions/Restrictions: Impaired Precaution/Restrictions Comments: hx of vascular dementia Restrictions Weight Bearing Restrictions Per Provider Order: No     Mobility Bed Mobility Overal bed mobility: Needs  Assistance Bed Mobility: Sit to Supine, Rolling, Sidelying to Sit Rolling: Mod assist, Used rails Sidelying to sit: Mod assist   Sit to supine: Max assist, HOB elevated   General bed mobility comments: mod A to roll and raise trunk, max A to assist with trunk control and return BLEs to bed.    Transfers Overall transfer level: Needs assistance Equipment used: Rolling walker (2 wheels) Transfers: Sit to/from Stand Sit to Stand: Max assist           General transfer comment: STSx1 from EOB with bear hug method to help engage aneterior weight shifting, STSx1 with RW max A but pt unable to gather balance or offload her hips shifting posteriorly and resisting tactile/verbal cues to shift anteriorly.      Balance Overall balance assessment: Needs assistance Sitting-balance support: Single extremity supported, Feet unsupported Sitting balance-Leahy Scale: Poor Sitting balance - Comments: tends to tilt back, needing cues to keep knees bent and feet underneath her. Postural control: Posterior lean Standing balance support: Bilateral upper extremity supported, Reliant on assistive device for balance Standing balance-Leahy Scale: Poor Standing balance comment: max A                           ADL either performed or assessed with clinical judgement   ADL Overall ADL's : Needs assistance/impaired Eating/Feeding: Set up;Bed level   Grooming: Sitting;Contact guard assist;Wash/dry face Grooming Details (indicate cue type and reason): CGA to min A for balance. Upper Body Bathing: Bed level;Set up   Lower Body Bathing: Maximal assistance;Sitting/lateral leans   Upper Body Dressing : Bed level;Minimal assistance   Lower Body Dressing:  Total assistance;Sitting/lateral leans;Sit to/from stand   Toilet Transfer: +2 for safety/equipment;+2 for physical assistance;Maximal assistance Toilet Transfer Details (indicate cue type and reason): anticipate +2 for pivot transfer based on  STS. Toileting- Clothing Manipulation and Hygiene: +2 for safety/equipment;+2 for physical assistance;Maximal assistance;Sit to/from stand               Vision         Perception         Praxis         Pertinent Vitals/Pain Pain Assessment Pain Assessment: Faces Faces Pain Scale: Hurts little more Pain Location: BLEs, R>L Pain Descriptors / Indicators: Aching, Discomfort, Sore Pain Intervention(s): Repositioned, Monitored during session, Limited activity within patient's tolerance     Extremity/Trunk Assessment Upper Extremity Assessment Upper Extremity Assessment: Generalized weakness   Lower Extremity Assessment Lower Extremity Assessment: RLE deficits/detail;LLE deficits/detail;Generalized weakness;Defer to PT evaluation RLE Deficits / Details: LE edema, LLE Deficits / Details: LE edema       Communication Communication Communication: No apparent difficulties   Cognition Arousal: Alert Behavior During Therapy: WFL for tasks assessed/performed Cognition: History of cognitive impairments   Orientation impairments: Situation, Place Awareness: Intellectual awareness intact, Online awareness impaired   Attention impairment (select first level of impairment): Selective attention   OT - Cognition Comments: Hx of vascular dementia at basline. Pt with some awareness of her deficits                 Following commands: Intact       Cueing  General Comments   Cueing Techniques: Verbal cues;Gestural cues;Tactile cues  EEG in room, pt return to bed   Exercises     Shoulder Instructions      Home Living Family/patient expects to be discharged to:: Private residence Living Arrangements: Children (son and DIL) Available Help at Discharge: Family;Available PRN/intermittently (both work) Type of Home: Mobile home Home Access: Stairs to enter Secretary/administrator of Steps: 4 Entrance Stairs-Rails: Right;Left;Can reach both Home Layout: One level      Bathroom Shower/Tub: Producer, television/film/video:  (comfort height)     Home Equipment: Agricultural consultant (2 wheels);Shower seat;Cane - single point;Hand held shower head;Grab bars - tub/shower   Additional Comments: Pt reports she is sometimes at home alone.      Prior Functioning/Environment Prior Level of Function : Independent/Modified Independent             Mobility Comments: Mod I with spc or RW ADLs Comments: ind, does not drive. son or DIL manages her own medications.    OT Problem List: Decreased strength;Impaired balance (sitting and/or standing);Decreased activity tolerance;Obesity;Increased edema;Pain;Decreased cognition   OT Treatment/Interventions: Self-care/ADL training;Therapeutic activities;Therapeutic exercise;Visual/perceptual remediation/compensation;Patient/family education;Balance training      OT Goals(Current goals can be found in the care plan section)   Acute Rehab OT Goals Patient Stated Goal: to get stronger/better; and eat OT Goal Formulation: With patient Time For Goal Achievement: 12/22/23 Potential to Achieve Goals: Good ADL Goals Pt Will Perform Lower Body Bathing: sitting/lateral leans;with contact guard assist Pt Will Transfer to Toilet: with contact guard assist;bedside commode Additional ADL Goal #1: Pt will complete static sitting >5 min EOB with supervision and correct posture in prep for seated ADLs. Additional ADL Goal #2: Pt will complete 3 mins of OOB activity to demonstrate improving activity tolerance of BLEs.   OT Frequency:  Min 2X/week    Co-evaluation              AM-PAC OT  6 Clicks Daily Activity     Outcome Measure Help from another person eating meals?: A Little Help from another person taking care of personal grooming?: A Little Help from another person toileting, which includes using toliet, bedpan, or urinal?: A Lot Help from another person bathing (including washing, rinsing, drying)?: A  Lot Help from another person to put on and taking off regular upper body clothing?: A Little Help from another person to put on and taking off regular lower body clothing?: A Lot 6 Click Score: 15   End of Session Equipment Utilized During Treatment: Gait belt;Rolling walker (2 wheels) Nurse Communication: Mobility status;Need for lift equipment (Stedy +2)  Activity Tolerance: Patient tolerated treatment well Patient left: in bed;with call bell/phone within reach;Other (comment) (EEG tech in room)  OT Visit Diagnosis: Unsteadiness on feet (R26.81);Other abnormalities of gait and mobility (R26.89);Muscle weakness (generalized) (M62.81);Other symptoms and signs involving cognitive function;Pain Pain - Right/Left:  (bilat) Pain - part of body: Leg                Time: 9194-9164 OT Time Calculation (min): 30 min Charges:  OT General Charges $OT Visit: 1 Visit OT Evaluation $OT Eval Moderate Complexity: 1 Mod OT Treatments $Therapeutic Activity: 8-22 mins  12/08/2023  AB, OTR/L  Acute Rehabilitation Services  Office: [phone]   Curtistine JONETTA Das 12/08/2023, 8:52 AM

## 2023-12-08 NOTE — Progress Notes (Signed)
 [facility] PROGRESS NOTE    Progress Note  Alicia Brennan  [name] DOB: [birth year] DOA: 12/06/2023 PCP: Marya Rosina NOVAK, MD     Brief Narrative:   Alicia Brennan is an 73 y.o. female past medical history of sarcoidosis, essential hypertension seizure disorder vascular dementia diagnosed about 2 years ago, recently seen at [facility] for suspected she seizures treated with Depakote  Vimpat  and Keppra  brought in for evaluation of altered mental status.  Imaging were unremarkable.  Sent for ultrasound of the liver that showed fatty liver.   Assessment/Plan:   Acute metabolic encephalopathy superimposed on vascular dementia: Neurology was consulted recommended to continue Keppra  Depakote  and Vimpat . He also recommended long-term EEG results are pending And decreasing Depakote  dose.  Generalized weakness/frequent falls: MRI in June showed no acute findings. Imaging here showed no acute findings. PT OT was consulted.  Bilateral lower extremity swelling: Appears like lymphedema chronic, 2D echo showed preserved EF. Amlodipine  discontinued follow-up with PCP. Lower extremity Doppler negative for DVT.  Essential hypertension: Continue Micardis.  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  DVT prophylaxis: lovenox  Family Communication:daughter Status is: Observation The patient remains OBS appropriate and will d/c before 2 midnights.    Code Status:     Code Status Orders  (From admission, onward)           Start     Ordered   12/06/23 2100  Full code  Continuous       Question:  By:  Answer:  Consent: discussion documented in EHR   12/06/23 2059           Code Status History     Date Active Date Inactive Code Status Order ID Comments User Context   09/12/2023 1124 09/13/2023 2149 Full Code [id number]  Georgina Basket, MD Inpatient   09/12/2023 1047 09/12/2023 1124 Full Code [id number]  Georgina Basket, MD Inpatient   09/12/2023 0356  09/12/2023 1047 Full Code [id number]  Dena Charleston, MD ED   11/15/2015 1424 11/18/2015 2146 Full Code [id number]  Fidel Rogue, MD Inpatient   04/25/2015 1158 04/26/2015 1921 Full Code [id number]  Armanda Standing, MD ED         IV Access:   Peripheral IV   Procedures and diagnostic studies:   VAS US  LOWER EXTREMITY VENOUS (DVT) Result Date: 12/07/2023  Lower Venous DVT Study Patient Name:  Alicia Brennan  Date of Exam:   12/07/2023 Medical Rec #: [id number]       Accession #:    [id number] Date of Birth: [birth year]        Patient Gender: F Patient Age:   20 years Exam Location:  [facility] Procedure:      VAS US  LOWER EXTREMITY VENOUS (DVT) Referring Phys: RAMESH KC --------------------------------------------------------------------------------  Indications: Edema.  Limitations: Body habitus, poor ultrasound/tissue interface and Skin texture. Comparison Study: No prior study on file Performing Technologist: Alberta Lis RVS  Examination Guidelines: A complete evaluation includes B-mode imaging, spectral Doppler, color Doppler, and power Doppler as needed of all accessible portions of each vessel. Bilateral testing is considered an integral part of a complete examination. Limited examinations for reoccurring indications may be performed as noted. The reflux portion of the exam is performed with the patient in reverse Trendelenburg.  +---------+---------------+---------+-----------+----------+-------------------+ RIGHT    CompressibilityPhasicitySpontaneityPropertiesThrombus Aging      +---------+---------------+---------+-----------+----------+-------------------+ CFV      Full           Yes      No                                       +---------+---------------+---------+-----------+----------+-------------------+  SFJ      Full                                                             +---------+---------------+---------+-----------+----------+-------------------+ FV  Prox  Full           Yes      No                                       +---------+---------------+---------+-----------+----------+-------------------+ FV Mid   Full                                                             +---------+---------------+---------+-----------+----------+-------------------+ FV Distal               Yes      No                   patent by color and                                                       Doppler             +---------+---------------+---------+-----------+----------+-------------------+ PFV      Full           Yes      No                                       +---------+---------------+---------+-----------+----------+-------------------+ POP      Full           Yes      No                                       +---------+---------------+---------+-----------+----------+-------------------+ PTV      Full                                                             +---------+---------------+---------+-----------+----------+-------------------+ PERO                                                  Not well visualized +---------+---------------+---------+-----------+----------+-------------------+   +---------+---------------+---------+-----------+----------+-------------------+ LEFT     CompressibilityPhasicitySpontaneityPropertiesThrombus Aging      +---------+---------------+---------+-----------+----------+-------------------+ CFV      Full           Yes      No                                       +---------+---------------+---------+-----------+----------+-------------------+  SFJ      Full                                                             +---------+---------------+---------+-----------+----------+-------------------+ FV Prox  Full           Yes      No                                       +---------+---------------+---------+-----------+----------+-------------------+ FV Mid                                                 patent by color     +---------+---------------+---------+-----------+----------+-------------------+ FV Distal                                             patent by color and                                                       Doppler             +---------+---------------+---------+-----------+----------+-------------------+ PFV      Full                                                             +---------+---------------+---------+-----------+----------+-------------------+ POP                     Yes      No                   patent by color and                                                       Doppler             +---------+---------------+---------+-----------+----------+-------------------+ PTV                                                   Not well visualized +---------+---------------+---------+-----------+----------+-------------------+ PERO                                                  Not well visualized +---------+---------------+---------+-----------+----------+-------------------+  Summary: RIGHT: - There is no evidence of deep vein thrombosis in the lower extremity. However, portions of this examination were limited- see technologist comments above.  LEFT: - There is no evidence of deep vein thrombosis in the lower extremity. However, portions of this examination were limited- see technologist comments above.  *See table(s) above for measurements and observations.    Preliminary    US  Abdomen Limited RUQ (LIVER/GB) Result Date: 12/06/2023 CLINICAL DATA:  Edema EXAM: ULTRASOUND ABDOMEN LIMITED RIGHT UPPER QUADRANT COMPARISON:  None Available. FINDINGS: Gallbladder: Prior cholecystectomy Common bile duct: Diameter: Normal caliber, 4 mm Liver: Increased echotexture compatible with fatty infiltration. No focal abnormality or biliary ductal dilatation. Portal vein is patent on color  Doppler imaging with normal direction of blood flow towards the liver. Other: None. IMPRESSION: Prior cholecystectomy. Fatty liver. No acute findings. Electronically Signed   By: Franky Crease M.D.   On: 12/06/2023 19:30   CT Cervical Spine Wo Contrast Result Date: 12/06/2023 CLINICAL DATA:  Syncope. EXAM: CT CERVICAL SPINE WITHOUT CONTRAST TECHNIQUE: Multidetector CT imaging of the cervical spine was performed without intravenous contrast. Multiplanar CT image reconstructions were also generated. RADIATION DOSE REDUCTION: This exam was performed according to the departmental dose-optimization program which includes automated exposure control, adjustment of the mA and/or kV according to patient size and/or use of iterative reconstruction technique. COMPARISON:  March 31, 2015 FINDINGS: Alignment: There is 1 mm to 2 mm retrolisthesis of the C3 vertebral body on C4. Approximately 1 mm anterolisthesis of C6 is seen on C7. Skull base and vertebrae: No acute fracture. No primary bone lesion or focal pathologic process. Soft tissues and spinal canal: No prevertebral fluid or swelling. No visible canal hematoma. Disc levels: Marked severity endplate sclerosis, moderate severity anterior osteophyte formation and posterior bony spurring are seen at the levels of C3-C4, C4-C5, C5-C6 and C6-C7. Marked severity intervertebral disc space narrowing is present at C3-C4, C4-C5, C5-C6 and C6-C7. Bilateral moderate to marked severity multilevel facet joint hypertrophy is noted. Upper chest: An 11 mm pulmonary nodule is seen along the medial aspect of the right apex (axial CT image 84, CT series 9). This measures 7 mm on the prior study. Other: None. IMPRESSION: 1. Marked severity multilevel degenerative changes, as described above. 2. No acute cervical spine fracture. 3. 11 mm right apical pulmonary nodule, as described above. Correlation with dedicated nonemergent chest CT is recommended. Electronically Signed   By: Suzen Dials M.D.   On: 12/06/2023 14:53   CT Head Wo Contrast Result Date: 12/06/2023 CLINICAL DATA:  Syncope. EXAM: CT HEAD WITHOUT CONTRAST TECHNIQUE: Contiguous axial images were obtained from the base of the skull through the vertex without intravenous contrast. RADIATION DOSE REDUCTION: This exam was performed according to the departmental dose-optimization program which includes automated exposure control, adjustment of the mA and/or kV according to patient size and/or use of iterative reconstruction technique. COMPARISON:  September 12, 2023 FINDINGS: Brain: There is generalized cerebral atrophy with widening of the extra-axial spaces and ventricular dilatation. There are areas of decreased attenuation within the white matter tracts of the supratentorial brain, consistent with microvascular disease changes. Vascular: No hyperdense vessel or unexpected calcification. Skull: Normal. Negative for fracture or focal lesion. Sinuses/Orbits: No acute finding. Other: None. IMPRESSION: 1. Generalized cerebral atrophy and microvascular disease changes of the supratentorial brain. 2. No acute intracranial abnormality. Electronically Signed   By: Suzen Dials M.D.   On: 12/06/2023 14:47   DG Chest Portable 1 View Result Date:  12/06/2023 CLINICAL DATA:  Shortness of breath. EXAM: PORTABLE CHEST 1 VIEW COMPARISON:  09/12/2023. FINDINGS: The heart size and mediastinal contours are within normal limits. Aortic atherosclerosis. No overt pulmonary edema, focal consolidation, pleural effusion, or pneumothorax. No acute osseous abnormality. IMPRESSION: No acute cardiopulmonary findings. Electronically Signed   By: Harrietta Sherry M.D.   On: 12/06/2023 14:30   DG Hip Unilat W or Wo Pelvis 2-3 Views Right Result Date: 12/06/2023 CLINICAL DATA:  Slipped from couch.  Leg swelling. EXAM: DG HIP (WITH OR WITHOUT PELVIS) 2-3V RIGHT COMPARISON:  11/15/2015. FINDINGS: There is no evidence of acute fracture or dislocation. Status  post bilateral total hip arthroplasties with normal alignment. Hardware appears intact. Sacroiliac joints and pubic symphysis appear anatomically aligned. Degenerative changes of the visualized lower lumbar spine. IMPRESSION: 1. No acute osseous abnormality. 2. 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 Electronically Signed   By: Harrietta Sherry M.D.   On: 12/06/2023 14:29     Medical Consultants:   None.   Subjective:    Alicia Brennan no complaints this morning.  Objective:    Vitals:   12/07/23 1644 12/07/23 1956 12/08/23 0016 12/08/23 0414  BP: (!) 146/73 (!) 157/78 136/70 (!) 153/69  Pulse: 63 71 61 62  Resp: 19 18 15 18   Temp: 97.7 F (36.5 C) 98 F (36.7 C) 98.7 F (37.1 C) 97.8 F (36.6 C)  TempSrc: Oral Oral Oral Oral  SpO2: 99% 99% 95% 100%  Weight:      Height:       SpO2: 100 %  No intake or output data in the 24 hours ending 12/08/23 0636 Filed Weights   12/06/23 1334  Weight: 83.7 kg    Exam: General exam: In no acute distress. Respiratory system: Good air movement and clear to auscultation. Cardiovascular system: S1 & S2 heard, RRR. No JVD. Gastrointestinal system: Abdomen is nondistended, soft and nontender.  Extremities: No pedal edema. Skin: No rashes, lesions or ulcers Psychiatry: Judgement and insight appear normal. Mood & affect appropriate.    Data Reviewed:    Labs: Basic Metabolic Panel: Recent Labs  Lab 12/06/23 1658 12/07/23 0500  NA 135 135  K 3.9 4.3  CL 103 103  CO2 21* 21*  GLUCOSE 103* 93  BUN 8 10  CREATININE 0.68 0.83  CALCIUM 9.4 9.0   GFR Estimated Creatinine Clearance: 62.2 mL/min (by C-G formula based on SCr of 0.83 mg/dL). Liver Function Tests: Recent Labs  Lab 12/06/23 1658  AST 41  ALT 21  ALKPHOS 113  BILITOT 0.7  PROT 8.2*  ALBUMIN  3.8   Recent Labs  Lab 12/06/23 1658  LIPASE 27   Recent Labs  Lab 12/06/23 1658 12/07/23 0626  AMMONIA 54* 54*   Coagulation  profile Recent Labs  Lab 12/06/23 2001  INR 1.0   COVID-19 Labs  No results for input(s): DDIMER, FERRITIN, LDH, CRP in the last 72 hours.  Lab Results  Component Value Date   SARSCOV2NAA NEGATIVE 11/27/2021    CBC: Recent Labs  Lab 12/06/23 1658 12/07/23 0500  WBC 3.9* 3.6*  NEUTROABS 1.8  --   HGB 14.0 13.5  HCT 44.8 43.4  MCV 93.5 94.1  PLT 134* 128*   Cardiac Enzymes: No results for input(s): CKTOTAL, CKMB, CKMBINDEX, TROPONINI in the last 168 hours. BNP (last 3 results) Recent Labs    12/06/23 1658  PROBNP 64.3   CBG: No results for input(s): GLUCAP in the last 168 hours. D-Dimer: No results for  input(s): DDIMER in the last 72 hours. Hgb A1c: No results for input(s): HGBA1C in the last 72 hours. Lipid Profile: No results for input(s): CHOL, HDL, LDLCALC, TRIG, CHOLHDL, LDLDIRECT in the last 72 hours. Thyroid function studies: No results for input(s): TSH, T4TOTAL, T3FREE, THYROIDAB in the last 72 hours.  Invalid input(s): FREET3 Anemia work up: No results for input(s): VITAMINB12, FOLATE, FERRITIN, TIBC, IRON, RETICCTPCT in the last 72 hours. Sepsis Labs: Recent Labs  Lab 12/06/23 1658 12/07/23 0500  WBC 3.9* 3.6*   Microbiology No results found for this or any previous visit (from the past 240 hours).   Medications:    divalproex   750 mg Oral QHS   enoxaparin  (LOVENOX ) injection  40 mg Subcutaneous Q24H   furosemide   40 mg Oral Daily   irbesartan   300 mg Oral Daily   lacosamide   50 mg Oral BID   lactulose   10 g Oral Daily   levETIRAcetam   500 mg Oral BID   mouth rinse  15 mL Mouth Rinse Q2H   polyethylene glycol  17 g Oral Daily   senna-docusate  1 tablet Oral BID   Continuous Infusions:    LOS: 0 days   Alicia Brennan  [facility]  12/08/2023, 6:36 AM

## 2023-12-09 ENCOUNTER — Inpatient Hospital Stay (HOSPITAL_COMMUNITY)

## 2023-12-09 DIAGNOSIS — R7989 Other specified abnormal findings of blood chemistry: Secondary | ICD-10-CM | POA: Diagnosis not present

## 2023-12-09 DIAGNOSIS — R569 Unspecified convulsions: Secondary | ICD-10-CM

## 2023-12-09 DIAGNOSIS — R269 Unspecified abnormalities of gait and mobility: Secondary | ICD-10-CM | POA: Diagnosis not present

## 2023-12-09 DIAGNOSIS — R4182 Altered mental status, unspecified: Secondary | ICD-10-CM | POA: Diagnosis not present

## 2023-12-09 DIAGNOSIS — G9340 Encephalopathy, unspecified: Secondary | ICD-10-CM | POA: Diagnosis not present

## 2023-12-09 NOTE — Progress Notes (Signed)
 [facility] PROGRESS NOTE    Progress Note  Alicia Brennan  [name] DOB: 31-Mar-1950 DOA: 12/06/2023 PCP: Marya Rosina NOVAK, MD     Brief Narrative:   Alicia Brennan is an 73 y.o. female past medical history of sarcoidosis, essential hypertension seizure disorder vascular dementia diagnosed about 2 years ago, recently seen at [facility] for suspected she seizures treated with Depakote  Vimpat  and Keppra  brought in for evaluation of altered mental status.  Imaging were unremarkable.  Ultrasound of the liver that showed fatty liver.  Assessment/Plan:   Acute metabolic encephalopathy superimposed on vascular dementia: Neurology recommended to continue Keppra  twice a day Depakote  and Vimpat . No further recommendations.  EEG showed no evidence of seizures. PT evaluated the patient recommended skilled nursing facility. TOC has been notified.  Generalized weakness/frequent falls: MRI in June showed no acute findings. Imaging here showed no acute findings. PT OT was consulted.  Bilateral lower extremity swelling: Appears like lymphedema chronic, 2D echo showed preserved EF. Amlodipine  discontinued follow-up with PCP. Lower extremity Doppler negative for DVT.  Essential hypertension: Continue Micardis.  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  DVT prophylaxis: lovenox  Family Communication:daughter Status is: Observation The patient remains OBS appropriate and will d/c before 2 midnights.    Code Status:     Code Status Orders  (From admission, onward)           Start     Ordered   12/06/23 2100  Full code  Continuous       Question:  By:  Answer:  Consent: discussion documented in EHR   12/06/23 2059           Code Status History     Date Active Date Inactive Code Status Order ID Comments User Context   09/12/2023 1124 09/13/2023 2149 Full Code [id number]  Georgina Basket, MD Inpatient   09/12/2023 1047 09/12/2023 1124 Full Code  [id number]  Georgina Basket, MD Inpatient   09/12/2023 0356 09/12/2023 1047 Full Code [id number]  Dena Charleston, MD ED   11/15/2015 1424 11/18/2015 2146 Full Code [id number]  Fidel Rogue, MD Inpatient   04/25/2015 1158 04/26/2015 1921 Full Code [id number]  Armanda Standing, MD ED         IV Access:   Peripheral IV   Procedures and diagnostic studies:   Overnight EEG with video Result Date: 12/09/2023 Shelton Arlin KIDD, MD     12/09/2023  6:48 AM Patient Name: Alicia Brennan MRN: [id number] Epilepsy Attending: Arlin KIDD Shelton Referring Physician/Provider: Vanessa Robert, MD Duration: 12/08/2023 0933 to 12/09/2023 0645 Patient history: 73 y.o. female with hx of with a past medical history significant for pulmonary sarcoidosis, IBS, hypertension, seizure disorder who presented to the [facility] ED due to altered mental status. EEG to evaluate for seizure Level of alertness: Awake, asleep AEDs during EEG study: LEV, LCM, VPA Technical aspects: This EEG study was done with scalp electrodes positioned according to the 10-20 International system of electrode placement. Electrical activity was reviewed with band pass filter of 1-70Hz , sensitivity of 7 uV/mm, display speed of 82mm/sec with a 60Hz  notched filter applied as appropriate. EEG data were recorded continuously and digitally stored.  Video monitoring was available and reviewed as appropriate. Description: The posterior dominant rhythm consists of 8 Hz activity of moderate voltage (25-35 uV) seen predominantly in posterior head regions, symmetric and reactive to eye opening and eye closing. Sleep was characterized by vertex waves, sleep spindles (12 to 14 Hz), maximal frontocentral region.  Hyperventilation  and photic stimulation were not performed.   IMPRESSION: This study is within normal limits. No seizures or epileptiform discharges were seen throughout the recording. A normal interictal EEG does not exclude the diagnosis of epilepsy. Priyanka O  Yadav   VAS US  LOWER EXTREMITY VENOUS (DVT) Result Date: 12/08/2023  Lower Venous DVT Study Patient Name:  Alicia Brennan  Date of Exam:   12/07/2023 Medical Rec #: [id number]       Accession #:    [id number] Date of Birth: 1950-06-20        Patient Gender: F Patient Age:   4 years Exam Location:  [facility] Procedure:      VAS US  LOWER EXTREMITY VENOUS (DVT) Referring Phys: RAMESH KC --------------------------------------------------------------------------------  Indications: Edema.  Limitations: Body habitus, poor ultrasound/tissue interface and Skin texture. Comparison Study: No prior study on file Performing Technologist: Alberta Lis RVS  Examination Guidelines: A complete evaluation includes B-mode imaging, spectral Doppler, color Doppler, and power Doppler as needed of all accessible portions of each vessel. Bilateral testing is considered an integral part of a complete examination. Limited examinations for reoccurring indications may be performed as noted. The reflux portion of the exam is performed with the patient in reverse Trendelenburg.  +---------+---------------+---------+-----------+----------+-------------------+ RIGHT    CompressibilityPhasicitySpontaneityPropertiesThrombus Aging      +---------+---------------+---------+-----------+----------+-------------------+ CFV      Full           Yes      No                                       +---------+---------------+---------+-----------+----------+-------------------+ SFJ      Full                                                             +---------+---------------+---------+-----------+----------+-------------------+ FV Prox  Full           Yes      No                                       +---------+---------------+---------+-----------+----------+-------------------+ FV Mid   Full                                                              +---------+---------------+---------+-----------+----------+-------------------+ FV Distal               Yes      No                   patent by color and                                                       Doppler             +---------+---------------+---------+-----------+----------+-------------------+ PFV  Full           Yes      No                                       +---------+---------------+---------+-----------+----------+-------------------+ POP      Full           Yes      No                                       +---------+---------------+---------+-----------+----------+-------------------+ PTV      Full                                                             +---------+---------------+---------+-----------+----------+-------------------+ PERO                                                  Not well visualized +---------+---------------+---------+-----------+----------+-------------------+   +---------+---------------+---------+-----------+----------+-------------------+ LEFT     CompressibilityPhasicitySpontaneityPropertiesThrombus Aging      +---------+---------------+---------+-----------+----------+-------------------+ CFV      Full           Yes      No                                       +---------+---------------+---------+-----------+----------+-------------------+ SFJ      Full                                                             +---------+---------------+---------+-----------+----------+-------------------+ FV Prox  Full           Yes      No                                       +---------+---------------+---------+-----------+----------+-------------------+ FV Mid                                                patent by color     +---------+---------------+---------+-----------+----------+-------------------+ FV Distal                                             patent by color and  Doppler             +---------+---------------+---------+-----------+----------+-------------------+ PFV      Full                                                             +---------+---------------+---------+-----------+----------+-------------------+ POP                     Yes      No                   patent by color and                                                       Doppler             +---------+---------------+---------+-----------+----------+-------------------+ PTV                                                   Not well visualized +---------+---------------+---------+-----------+----------+-------------------+ PERO                                                  Not well visualized +---------+---------------+---------+-----------+----------+-------------------+     Summary: RIGHT: - There is no evidence of deep vein thrombosis in the lower extremity. However, portions of this examination were limited- see technologist comments above.  LEFT: - There is no evidence of deep vein thrombosis in the lower extremity. However, portions of this examination were limited- see technologist comments above.  *See table(s) above for measurements and observations. Electronically signed by Fonda Rim on 12/08/2023 at 8:25:21 AM.    Final      Medical Consultants:   None.   Subjective:    Alicia Brennan sleeping this am  Objective:    Vitals:   12/08/23 1800 12/08/23 2015 12/08/23 2343 12/09/23 0337  BP: 138/73 (!) 122/59 121/67 135/68  Pulse:  70 64 62  Resp:  17 16   Temp: 98.3 F (36.8 C) 98.7 F (37.1 C) 98 F (36.7 C) 97.8 F (36.6 C)  TempSrc: Axillary Oral Oral Oral  SpO2: 98% 95% 95% 96%  Weight:      Height:       SpO2: 96 %   Intake/Output Summary (Last 24 hours) at 12/09/2023 0733 Last data filed at 12/09/2023 0500 Gross per 24 hour  Intake 720 ml  Output 1650 ml  Net  -930 ml   Filed Weights   12/06/23 1334  Weight: 83.7 kg    Exam: General exam: In no acute distress. Respiratory system: Good air movement and clear to auscultation. Cardiovascular system: S1 & S2 heard, RRR. No JVD. Gastrointestinal system: Abdomen is nondistended, soft and nontender.  Extremities: No pedal edema. Skin: No rashes, lesions or ulcers Psychiatry: Judgement and insight appear normal. Mood & affect appropriate.  Data Reviewed:    Labs: Basic  Metabolic Panel: Recent Labs  Lab 12/06/23 1658 12/07/23 0500  NA 135 135  K 3.9 4.3  CL 103 103  CO2 21* 21*  GLUCOSE 103* 93  BUN 8 10  CREATININE 0.68 0.83  CALCIUM 9.4 9.0   GFR Estimated Creatinine Clearance: 62.2 mL/min (by C-G formula based on SCr of 0.83 mg/dL). Liver Function Tests: Recent Labs  Lab 12/06/23 1658  AST 41  ALT 21  ALKPHOS 113  BILITOT 0.7  PROT 8.2*  ALBUMIN  3.8   Recent Labs  Lab 12/06/23 1658  LIPASE 27   Recent Labs  Lab 12/06/23 1658 12/07/23 0626  AMMONIA 54* 54*   Coagulation profile Recent Labs  Lab 12/06/23 2001  INR 1.0   COVID-19 Labs  No results for input(s): DDIMER, FERRITIN, LDH, CRP in the last 72 hours.  Lab Results  Component Value Date   SARSCOV2NAA NEGATIVE 11/27/2021    CBC: Recent Labs  Lab 12/06/23 1658 12/07/23 0500  WBC 3.9* 3.6*  NEUTROABS 1.8  --   HGB 14.0 13.5  HCT 44.8 43.4  MCV 93.5 94.1  PLT 134* 128*   Cardiac Enzymes: No results for input(s): CKTOTAL, CKMB, CKMBINDEX, TROPONINI in the last 168 hours. BNP (last 3 results) Recent Labs    12/06/23 1658  PROBNP 64.3   CBG: No results for input(s): GLUCAP in the last 168 hours. D-Dimer: No results for input(s): DDIMER in the last 72 hours. Hgb A1c: No results for input(s): HGBA1C in the last 72 hours. Lipid Profile: No results for input(s): CHOL, HDL, LDLCALC, TRIG, CHOLHDL, LDLDIRECT in the last 72 hours. Thyroid function  studies: No results for input(s): TSH, T4TOTAL, T3FREE, THYROIDAB in the last 72 hours.  Invalid input(s): FREET3 Anemia work up: No results for input(s): VITAMINB12, FOLATE, FERRITIN, TIBC, IRON, RETICCTPCT in the last 72 hours. Sepsis Labs: Recent Labs  Lab 12/06/23 1658 12/07/23 0500  WBC 3.9* 3.6*   Microbiology No results found for this or any previous visit (from the past 240 hours).   Medications:    divalproex   750 mg Oral QHS   enoxaparin  (LOVENOX ) injection  40 mg Subcutaneous Q24H   furosemide   40 mg Oral Daily   irbesartan   300 mg Oral Daily   lacosamide   50 mg Oral BID   lactulose   10 g Oral Daily   levETIRAcetam   500 mg Oral BID   mouth rinse  15 mL Mouth Rinse TID AC & HS   polyethylene glycol  17 g Oral Daily   senna-docusate  1 tablet Oral BID   Continuous Infusions:    LOS: 1 day   Erle Odell Castor  [facility]  12/09/2023, 7:33 AM

## 2023-12-09 NOTE — Progress Notes (Signed)
 vLTM discontinue  No skin breakdown noted at d/c  [facility] notified

## 2023-12-09 NOTE — Plan of Care (Signed)

## 2023-12-09 NOTE — Procedures (Addendum)
 Patient Name: Alicia Brennan  MRN: [id number]  Epilepsy Attending: Arlin MALVA Krebs  Referring Physician/Provider: Khaliqdina, Salman, MD  Duration: 12/08/2023 0933 to 12/09/2023 0750  Patient history: 73 y.o. female with hx of with a past medical history significant for pulmonary sarcoidosis, IBS, hypertension, seizure disorder who presented to the [facility] ED due to altered mental status. EEG to evaluate for seizure  Level of alertness: Awake, asleep  AEDs during EEG study: LEV, LCM, VPA  Technical aspects: This EEG study was done with scalp electrodes positioned according to the 10-20 International system of electrode placement. Electrical activity was reviewed with band pass filter of 1-70Hz , sensitivity of 7 uV/mm, display speed of 17mm/sec with a 60Hz  notched filter applied as appropriate. EEG data were recorded continuously and digitally stored.  Video monitoring was available and reviewed as appropriate.  Description: The posterior dominant rhythm consists of 8 Hz activity of moderate voltage (25-35 uV) seen predominantly in posterior head regions, symmetric and reactive to eye opening and eye closing. Sleep was characterized by vertex waves, sleep spindles (12 to 14 Hz), maximal frontocentral region.  Hyperventilation and photic stimulation were not performed.     IMPRESSION: This study is within normal limits. No seizures or epileptiform discharges were seen throughout the recording.  A normal interictal EEG does not exclude the diagnosis of epilepsy.   Nasha Diss O Himani Corona

## 2023-12-09 NOTE — Plan of Care (Signed)
  Problem: Education: Goal: Knowledge of General Education information will improve Description: Including pain rating scale, medication(s)/side effects and non-pharmacologic comfort measures 12/09/2023 0155 by Arlyss Daved CROME, RN Outcome: Progressing 12/09/2023 0154 by Arlyss Daved CROME, RN Outcome: Progressing   Problem: Health Behavior/Discharge Planning: Goal: Ability to manage health-related needs will improve Outcome: Progressing   Problem: Clinical Measurements: Goal: Ability to maintain clinical measurements within normal limits will improve Outcome: Progressing Goal: Diagnostic test results will improve Outcome: Progressing   Problem: Activity: Goal: Risk for activity intolerance will decrease Outcome: Not Progressing

## 2023-12-10 DIAGNOSIS — G9340 Encephalopathy, unspecified: Secondary | ICD-10-CM | POA: Diagnosis not present

## 2023-12-10 DIAGNOSIS — R4182 Altered mental status, unspecified: Secondary | ICD-10-CM | POA: Diagnosis not present

## 2023-12-10 DIAGNOSIS — R269 Unspecified abnormalities of gait and mobility: Secondary | ICD-10-CM | POA: Diagnosis not present

## 2023-12-10 DIAGNOSIS — R7989 Other specified abnormal findings of blood chemistry: Secondary | ICD-10-CM | POA: Diagnosis not present

## 2023-12-10 MED ORDER — HYDRALAZINE HCL 25 MG PO TABS
25.0000 mg | ORAL_TABLET | Freq: Three times a day (TID) | ORAL | Status: DC
  Administered 2023-12-10 – 2023-12-11 (×4): 25 mg via ORAL
  Filled 2023-12-10 (×4): qty 1

## 2023-12-10 NOTE — NC FL2 (Signed)
 [place]  MEDICAID FL2 LEVEL OF CARE FORM     IDENTIFICATION  Patient Name: Alicia Brennan Birthdate: [birth year] Sex: female Admission Date (Current Location): 12/06/2023  [facility] and [place] Number:  [employer] and Address:  [facility], [address] [place], [place] [address]      Provider Number: [id number]  Attending Physician Name and Address:  Odell Castor, Erle, MD  Relative Name and Phone Number:       Current Level of Care: Hospital Recommended Level of Care: Skilled Nursing Facility Prior Approval Number:    Date Approved/Denied:   PASRR Number: [id number]  Discharge Plan: SNF    Current Diagnoses: Patient Active Problem List   Diagnosis Date Noted   Acute encephalopathy 12/07/2023   Encephalopathy 12/06/2023   Seizure (HCC) 09/12/2023   Osteoarthritis of both hips 11/15/2015   Avascular necrosis of bones of both hips (HCC) 11/15/2015   Solitary pulmonary nodule 05/03/2015   Localization-related (focal) (partial) idiopathic epilepsy and epileptic syndromes with seizures of localized onset, not intractable, without status epilepticus (HCC) 04/27/2015   Generalized tonic-clonic seizure (HCC) 04/25/2015   Cancer (HCC)    Sarcoidosis of lung (HCC)    Hypertension    Recurrent headache     Orientation RESPIRATION BLADDER Height & Weight     Self, Time, Situation, Place  Normal Incontinent Weight: 184 lb 8.4 oz (83.7 kg) Height:  5' 4 (162.6 cm)  BEHAVIORAL SYMPTOMS/MOOD NEUROLOGICAL BOWEL NUTRITION STATUS    Convulsions/Seizures Continent Diet (Regular)  AMBULATORY STATUS COMMUNICATION OF NEEDS Skin   Extensive Assist Verbally Normal                       Personal Care Assistance Level of Assistance  Bathing, Feeding, Dressing Bathing Assistance: Maximum assistance Feeding assistance: Limited assistance Dressing Assistance: Maximum assistance     Functional Limitations Info             SPECIAL  CARE FACTORS FREQUENCY  PT (By licensed PT), OT (By licensed OT)     PT Frequency: 5x/wk OT Frequency: 5x/wk            Contractures Contractures Info: Not present    Additional Factors Info  Code Status, Allergies Code Status Info: Full Allergies Info: NKA           Current Medications (12/10/2023):  This is the current hospital active medication list Current Facility-Administered Medications  Medication Dose Route Frequency Provider Last Rate Last Admin   acetaminophen  (TYLENOL ) tablet 650 mg  650 mg Oral Q6H PRN Kc, Ramesh, MD       Or   acetaminophen  (TYLENOL ) suppository 650 mg  650 mg Rectal Q6H PRN Kc, Ramesh, MD       divalproex  (DEPAKOTE ) DR tablet 750 mg  750 mg Oral QHS Kc, Ramesh, MD   750 mg at 12/09/23 2106   enoxaparin  (LOVENOX ) injection 40 mg  40 mg Subcutaneous Q24H Kc, Ramesh, MD   40 mg at 12/10/23 1024   furosemide  (LASIX ) tablet 40 mg  40 mg Oral Daily Kc, Ramesh, MD   40 mg at 12/10/23 1023   hydrALAZINE  (APRESOLINE ) tablet 25 mg  25 mg Oral Q8H Odell Castor Erle, MD   25 mg at 12/10/23 0835   irbesartan  (AVAPRO ) tablet 300 mg  300 mg Oral Daily Kc, Ramesh, MD   300 mg at 12/10/23 1023   lacosamide  (VIMPAT ) tablet 50 mg  50 mg Oral BID Christobal Guadalajara, MD  50 mg at 12/10/23 1023   lactulose  (CHRONULAC ) 10 GM/15ML solution 10 g  10 g Oral Daily Kc, Ramesh, MD   10 g at 12/10/23 1022   levETIRAcetam  (KEPPRA ) tablet 500 mg  500 mg Oral BID Khaliqdina, Salman, MD   500 mg at 12/10/23 1023   ondansetron  (ZOFRAN ) tablet 4 mg  4 mg Oral Q6H PRN Kc, Mennie, MD       Or   ondansetron  (ZOFRAN ) injection 4 mg  4 mg Intravenous Q6H PRN Kc, Ramesh, MD       Oral care mouth rinse  15 mL Mouth Rinse PRN Kc, Ramesh, MD       Oral care mouth rinse  15 mL Mouth Rinse TID AC & HS Odell Celinda Balo, MD   15 mL at 12/10/23 1023   polyethylene glycol (MIRALAX  / GLYCOLAX ) packet 17 g  17 g Oral Daily Jillian Buttery, MD   17 g at 12/08/23 0958   senna-docusate (Senokot-S)  tablet 1 tablet  1 tablet Oral BID Adhikari, Amrit, MD   1 tablet at 12/10/23 1022     Discharge Medications: Please see discharge summary for a list of discharge medications.  Relevant Imaging Results:  Relevant Lab Results:   Additional Information SS#: [id number]  Almarie CHRISTELLA Goodie, LCSW

## 2023-12-10 NOTE — TOC Initial Note (Signed)
 Transition of Care [facility]) - Initial/Assessment Note    Patient Details  Name: Alicia Brennan MRN: [id number] Date of Birth: 07/28/1950  Transition of Care [facility]) CM/SW Contact:    Almarie CHRISTELLA Goodie, LCSW Phone Number: 12/10/2023, 1:47 PM  Clinical Narrative:         CSW spoke with daughter, [name], to discuss recommendation for SNF. Daughter in agreement, preference for [employer] as her daughter works there, or [employer]. CSW explained barriers to [employer] and daughter indicated understanding. CSW completed referral and faxed out. CSW attempted to reach [employer], left a voicemail and awaiting call back. [facility] can accept. CSW to follow.          Expected Discharge Plan: Skilled Nursing Facility Barriers to Discharge: Continued Medical Work up, English as a second language teacher   Patient Goals and CMS Choice Patient states their goals for this hospitalization and ongoing recovery are:: to get rehab CMS Medicare.gov Compare Post Acute Care list provided to:: Patient Represenative (must comment) Choice offered to / list presented to : Adult Children [place] ownership interest in [facility].provided to:: Adult Children    Expected Discharge Plan and Services     Post Acute Care Choice: Skilled Nursing Facility Living arrangements for the past 2 months: Single Family Home                                      Prior Living Arrangements/Services Living arrangements for the past 2 months: Single Family Home Lives with:: Adult Children Patient language and need for interpreter reviewed:: No Do you feel safe going back to the place where you live?: Yes      Need for Family Participation in Patient Care: Yes (Comment) Care giver support system in place?: No (comment)   Criminal Activity/Legal Involvement Pertinent to Current Situation/Hospitalization: No - Comment as needed  Activities of Daily Living      Permission  Sought/Granted Permission sought to share information with : Facility Medical sales representative, Family Supports Permission granted to share information with : Yes, Verbal Permission Granted  Share Information with NAME: [name] Gals  Permission granted to share info w AGENCY: SNF  Permission granted to share info w Relationship: Children     Emotional Assessment Appearance:: Appears stated age Attitude/Demeanor/Rapport: Engaged Affect (typically observed): Appropriate Orientation: : Oriented to Self, Oriented to Place, Oriented to  Time, Oriented to Situation Alcohol / Substance Use: Not Applicable Psych Involvement: No (comment)  Admission diagnosis:  Gait abnormality [R26.9] Encephalopathy [G93.40] Acute encephalopathy [G93.40] Increased ammonia level [R79.89] Altered mental status, unspecified altered mental status type [R41.82] Patient Active Problem List   Diagnosis Date Noted   Acute encephalopathy 12/07/2023   Encephalopathy 12/06/2023   Seizure (HCC) 09/12/2023   Osteoarthritis of both hips 11/15/2015   Avascular necrosis of bones of both hips (HCC) 11/15/2015   Solitary pulmonary nodule 05/03/2015   Localization-related (focal) (partial) idiopathic epilepsy and epileptic syndromes with seizures of localized onset, not intractable, without status epilepticus (HCC) 04/27/2015   Generalized tonic-clonic seizure (HCC) 04/25/2015   Cancer (HCC)    Sarcoidosis of lung (HCC)    Hypertension    Recurrent headache    PCP:  Marya Rosina NOVAK, MD Pharmacy:   [facility] [address] [place] - [address] [place] [address] Phone: [phone] Fax: [phone]  [facility] [address]  [place] [address] Phone: [phone] Fax: [phone]     Social Drivers of Health (SDOH) Social History: SDOH Screenings   Food Insecurity: No Food Insecurity (09/13/2023)  Housing: Low Risk  (09/13/2023)  Transportation  Needs: No Transportation Needs (11/16/2023)   [facility] from [facility]  Utilities: Low Risk  (11/16/2023)   Received from [facility]  Financial Resource Strain: Low Risk  (11/16/2023)   Received from [facility]  Tobacco Use: Low Risk  (12/07/2023)   SDOH Interventions:     Readmission Risk Interventions     No data to display

## 2023-12-10 NOTE — Progress Notes (Signed)
 [facility] PROGRESS NOTE    Progress Note  Alicia Brennan  [name] DOB: 1950/11/27 DOA: 12/06/2023 PCP: Marya Rosina NOVAK, MD     Brief Narrative:   Alicia Brennan is an 73 y.o. female past medical history of sarcoidosis, essential hypertension seizure disorder vascular dementia diagnosed about 2 years ago, recently seen at [facility] for suspected she seizures treated with Depakote  Vimpat  and Keppra  brought in for evaluation of altered mental status.  Imaging were unremarkable.  Ultrasound of the liver that showed fatty liver.  Assessment/Plan:   Acute metabolic encephalopathy superimposed on vascular dementia: Neurology recommended to continue Keppra  twice a day Depakote  and Vimpat . No further recommendations.  EEG showed no evidence of seizures. PT evaluated the patient recommended skilled nursing facility. TOC has been notified.  Generalized weakness/frequent falls: MRI in June showed no acute findings. Imaging here showed no acute findings. PT OT was consulted, will need skilled nurse facility placement.  Bilateral lower extremity swelling: Appears like lymphedema chronic, 2D echo showed preserved EF. Amlodipine  discontinued follow-up with PCP. Lower extremity Doppler negative for DVT.  Essential hypertension: Continue Avapro  Lasix . 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  DVT prophylaxis: lovenox  Family Communication:daughter Status is: Observation The patient remains OBS appropriate and will d/c before 2 midnights.    Code Status:     Code Status Orders  (From admission, onward)           Start     Ordered   12/06/23 2100  Full code  Continuous       Question:  By:  Answer:  Consent: discussion documented in EHR   12/06/23 2059           Code Status History     Date Active Date Inactive Code Status Order ID Comments User Context   09/12/2023 1124  09/13/2023 2149 Full Code [id number]  Georgina Basket, MD Inpatient   09/12/2023 1047 09/12/2023 1124 Full Code [id number]  Georgina Basket, MD Inpatient   09/12/2023 0356 09/12/2023 1047 Full Code [id number]  Dena Charleston, MD ED   11/15/2015 1424 11/18/2015 2146 Full Code [id number]  Fidel Rogue, MD Inpatient   04/25/2015 1158 04/26/2015 1921 Full Code [id number]  Armanda Standing, MD ED         IV Access:   Peripheral IV   Procedures and diagnostic studies:   Overnight EEG with video Result Date: 12/09/2023 Shelton Arlin KIDD, MD     12/09/2023  6:48 AM Patient Name: Alicia Brennan MRN: [id number] Epilepsy Attending: Arlin KIDD Shelton Referring Physician/Provider: Vanessa Robert, MD Duration: 12/08/2023 0933 to 12/09/2023 0645 Patient history: 73 y.o. female with hx of with a past medical history significant for pulmonary sarcoidosis, IBS, hypertension, seizure disorder who presented to the [facility] due to altered mental status. EEG to evaluate for seizure Level of alertness: Awake, asleep AEDs during EEG study: LEV, LCM, VPA Technical aspects: This EEG study was done with scalp electrodes positioned according to the 10-20 International system of electrode placement. Electrical activity was reviewed with band pass filter of 1-70Hz , sensitivity of 7 uV/mm, display speed of 16mm/sec with a 60Hz  notched filter applied as appropriate. EEG data were recorded continuously and digitally stored.  Video monitoring was available and reviewed as appropriate. Description: The posterior dominant rhythm consists of 8 Hz activity of moderate voltage (25-35 uV) seen predominantly in posterior head regions, symmetric and reactive to eye opening and eye closing. Sleep was  characterized by vertex waves, sleep spindles (12 to 14 Hz), maximal frontocentral region.  Hyperventilation and photic stimulation were not performed.   IMPRESSION: This study is within normal limits. No seizures or epileptiform discharges were seen  throughout the recording. A normal interictal EEG does not exclude the diagnosis of epilepsy. Arlin MALVA Krebs     Medical Consultants:   None.   Subjective:    Alicia Brennan no complaints this morning she relates she is hungry.  Objective:    Vitals:   12/09/23 1613 12/09/23 1955 12/09/23 2337 12/10/23 0333  BP: (!) 150/67 (!) 151/62 (!) 142/65 (!) 168/58  Pulse: 68 70 61 (!) 57  Resp: 18 18 18 18   Temp: 98.2 F (36.8 C) 98.6 F (37 C) 97.7 F (36.5 C) 97.6 F (36.4 C)  TempSrc: Oral Oral Oral Oral  SpO2: 97% 95% 94% 98%  Weight:      Height:       SpO2: 98 %   Intake/Output Summary (Last 24 hours) at 12/10/2023 0730 Last data filed at 12/10/2023 0600 Gross per 24 hour  Intake --  Output 1150 ml  Net -1150 ml   Filed Weights   12/06/23 1334  Weight: 83.7 kg    Exam: General exam: In no acute distress. Respiratory system: Good air movement and clear to auscultation. Cardiovascular system: S1 & S2 heard, RRR. No JVD. Gastrointestinal system: Abdomen is nondistended, soft and nontender.  Extremities: No pedal edema. Skin: No rashes, lesions or ulcers Psychiatry: Judgement and insight appear normal. Mood & affect appropriate.  Data Reviewed:    Labs: Basic Metabolic Panel: Recent Labs  Lab 12/06/23 1658 12/07/23 0500  NA 135 135  K 3.9 4.3  CL 103 103  CO2 21* 21*  GLUCOSE 103* 93  BUN 8 10  CREATININE 0.68 0.83  CALCIUM 9.4 9.0   GFR Estimated Creatinine Clearance: 62.2 mL/min (by C-G formula based on SCr of 0.83 mg/dL). Liver Function Tests: Recent Labs  Lab 12/06/23 1658  AST 41  ALT 21  ALKPHOS 113  BILITOT 0.7  PROT 8.2*  ALBUMIN  3.8   Recent Labs  Lab 12/06/23 1658  LIPASE 27   Recent Labs  Lab 12/06/23 1658 12/07/23 0626  AMMONIA 54* 54*   Coagulation profile Recent Labs  Lab 12/06/23 2001  INR 1.0   COVID-19 Labs  No results for input(s): DDIMER, FERRITIN, LDH, CRP in the last 72 hours.  Lab  Results  Component Value Date   SARSCOV2NAA NEGATIVE 11/27/2021    CBC: Recent Labs  Lab 12/06/23 1658 12/07/23 0500  WBC 3.9* 3.6*  NEUTROABS 1.8  --   HGB 14.0 13.5  HCT 44.8 43.4  MCV 93.5 94.1  PLT 134* 128*   Cardiac Enzymes: No results for input(s): CKTOTAL, CKMB, CKMBINDEX, TROPONINI in the last 168 hours. BNP (last 3 results) Recent Labs    12/06/23 1658  PROBNP 64.3   CBG: No results for input(s): GLUCAP in the last 168 hours. D-Dimer: No results for input(s): DDIMER in the last 72 hours. Hgb A1c: No results for input(s): HGBA1C in the last 72 hours. Lipid Profile: No results for input(s): CHOL, HDL, LDLCALC, TRIG, CHOLHDL, LDLDIRECT in the last 72 hours. Thyroid function studies: No results for input(s): TSH, T4TOTAL, T3FREE, THYROIDAB in the last 72 hours.  Invalid input(s): FREET3 Anemia work up: No results for input(s): VITAMINB12, FOLATE, FERRITIN, TIBC, IRON, RETICCTPCT in the last 72 hours. Sepsis Labs: Recent Labs  Lab 12/06/23 1658 12/07/23 0500  WBC 3.9* 3.6*   Microbiology No results found for this or any previous visit (from the past 240 hours).   Medications:    divalproex   750 mg Oral QHS   enoxaparin  (LOVENOX ) injection  40 mg Subcutaneous Q24H   furosemide   40 mg Oral Daily   irbesartan   300 mg Oral Daily   lacosamide   50 mg Oral BID   lactulose   10 g Oral Daily   levETIRAcetam   500 mg Oral BID   mouth rinse  15 mL Mouth Rinse TID AC & HS   polyethylene glycol  17 g Oral Daily   senna-docusate  1 tablet Oral BID   Continuous Infusions:    LOS: 2 days   Erle Odell Castor  [facility]  12/10/2023, 7:30 AM

## 2023-12-10 NOTE — Plan of Care (Signed)

## 2023-12-11 DIAGNOSIS — G9340 Encephalopathy, unspecified: Secondary | ICD-10-CM | POA: Diagnosis not present

## 2023-12-11 DIAGNOSIS — R7989 Other specified abnormal findings of blood chemistry: Secondary | ICD-10-CM | POA: Diagnosis not present

## 2023-12-11 DIAGNOSIS — R269 Unspecified abnormalities of gait and mobility: Secondary | ICD-10-CM | POA: Diagnosis not present

## 2023-12-11 DIAGNOSIS — R4182 Altered mental status, unspecified: Secondary | ICD-10-CM | POA: Diagnosis not present

## 2023-12-11 LAB — LEVETIRACETAM LEVEL: Levetiracetam Lvl: 14.7 ug/mL (ref 10.0–40.0)

## 2023-12-11 LAB — LAMOTRIGINE LEVEL: Lamotrigine Lvl: 1.7 ug/mL — ABNORMAL LOW (ref 2.0–20.0)

## 2023-12-11 LAB — GLUCOSE, CAPILLARY: Glucose-Capillary: 119 mg/dL — ABNORMAL HIGH (ref 70–99)

## 2023-12-11 MED ORDER — LACTULOSE 10 GM/15ML PO SOLN: 10.0000 g | Freq: Every day | ORAL | Status: DC

## 2023-12-11 MED ORDER — LEVETIRACETAM 500 MG PO TABS: 500.0000 mg | ORAL_TABLET | Freq: Two times a day (BID) | ORAL | Status: AC

## 2023-12-11 MED ORDER — HYDRALAZINE HCL 25 MG PO TABS: 25.0000 mg | ORAL_TABLET | Freq: Three times a day (TID) | ORAL | Status: DC

## 2023-12-11 MED ORDER — FUROSEMIDE 40 MG PO TABS: 40.0000 mg | ORAL_TABLET | Freq: Every day | ORAL | Status: DC

## 2023-12-11 NOTE — Care Management Important Message (Signed)
 Important Message  Patient Details  Name: Alicia Brennan MRN: [id number] Date of Birth: 22-Oct-1950   Important Message Given:  Yes - Medicare IM     Claretta Deed 12/11/2023, 4:20 PM

## 2023-12-11 NOTE — Discharge Summary (Signed)
 Physician Discharge Summary  Alicia Brennan DOB: 1950-04-11 DOA: 12/06/2023  PCP: Marya Rosina NOVAK, MD  Admit date: 12/06/2023 Discharge date: 12/11/2023  Admitted From: Home Disposition:  SNF  Recommendations for Outpatient Follow-up:  Follow up with PCP in 1-2 weeks Please obtain BMP/CBC in one week   Home Health:No Equipment/Devices:None  Discharge Condition:Stable CODE STATUS:Full Diet recommendation: Heart Healthy  Brief/Interim Summary: 73 y.o. female past medical history of sarcoidosis, essential hypertension seizure disorder vascular dementia diagnosed about 2 years ago, recently seen at [facility] for suspected she seizures treated with Depakote  Vimpat  and Keppra  brought in for evaluation of altered mental status.  Imaging were unremarkable.  Ultrasound of the liver that showed fatty liver.   Discharge Diagnoses:  Principal Problem:   Encephalopathy Active Problems:   Acute encephalopathy  Acute metabolic encephalopathy superimposed on vascular dementia: Neurology recommended to continue Keppra  twice a day at a lower dose, Depakote  and Vimpat . EEG showed no evidence of seizures. PT evaluated the patient, she will need to go to skilled nursing facility. Her ammonia level was slightly elevated she was placed on lactulose . Encephalopathy resolved.  Generalized weakness/frequent falls: PT OT evaluated the patient will need to go to skilled nursing facility.  Lower extremity swelling. Appears to be lymphedema, 2D echo showed preserved EF. Amlodipine  was discontinued. Lower extremity Doppler was negative. Follow-up with PCP.  Central hypertension: Continue ARB, Lasix  and hydralazine . Titrate as an outpatient.  Pulmonary sarcoid, Satting well on room air.   Discharge Instructions  Discharge Instructions     Diet - low sodium heart healthy   Complete by: As directed    Increase activity slowly   Complete by: As directed       Allergies as of  12/11/2023   No Known Allergies      Medication List     STOP taking these medications    FLUoxetine  20 MG capsule Commonly known as: PROZAC        TAKE these medications    amLODipine  10 MG tablet Commonly known as: NORVASC  Take 10 mg by mouth daily.   divalproex  250 MG DR tablet Commonly known as: Depakote  Take 1 tablet (250 mg total) by mouth 3 (three) times daily. What changed:  how much to take when to take this   furosemide  40 MG tablet Commonly known as: LASIX  Take 1 tablet (40 mg total) by mouth daily.   hydrALAZINE  25 MG tablet Commonly known as: APRESOLINE  Take 1 tablet (25 mg total) by mouth every 8 (eight) hours.   lacosamide  50 MG Tabs tablet Commonly known as: VIMPAT  Take 1 tablet (50 mg total) by mouth 2 (two) times daily.   lactulose  10 GM/15ML solution Commonly known as: CHRONULAC  Take 15 mLs (10 g total) by mouth daily.   lamoTRIgine  25 MG tablet Commonly known as: LAMICTAL  Take 25 mg by mouth 2 (two) times daily.   levETIRAcetam  500 MG tablet Commonly known as: KEPPRA  Take 1 tablet (500 mg total) by mouth 2 (two) times daily. What changed: how much to take   loperamide  2 MG tablet Commonly known as: IMODIUM  A-D Take 2 mg by mouth daily as needed for diarrhea or loose stools.   Nayzilam  5 MG/0.1ML Soln Generic drug: Midazolam  GIVE ONE SPRAY INTO 1 NOSTRIL. IF NO RESPONSE IN 10 MINUTES, MAY GIVE SECOND SPRAY IN OTHER NOSTRIL. DO NOT GIVE SECOND DOSE IF PATIENT HAS TROUBLE BREATHING OR EXCESSIVE SEDATION, MAX 2 DOSES/SEIZURE EPISODE, 1 EPISODE EVERY 3 DAYS OR 5 EPISODES/MONTH Nasal for 6  Days   telmisartan 80 MG tablet Commonly known as: MICARDIS Take 80 mg by mouth daily.        No Known Allergies  Consultations: Neurology   Procedures/Studies: Overnight EEG with video Result Date: 12/09/2023 Shelton Arlin KIDD, MD     12/10/2023 10:33 AM Patient Name: Alicia Brennan MRN: [id number] Epilepsy Attending: Arlin KIDD Shelton  Referring Physician/Provider: Khaliqdina, Salman, MD Duration: 12/08/2023 0933 to 12/09/2023 0750 Patient history: 73 y.o. female with hx of with a past medical history significant for pulmonary sarcoidosis, IBS, hypertension, seizure disorder who presented to the [facility] ED due to altered mental status. EEG to evaluate for seizure Level of alertness: Awake, asleep AEDs during EEG study: LEV, LCM, VPA Technical aspects: This EEG study was done with scalp electrodes positioned according to the 10-20 International system of electrode placement. Electrical activity was reviewed with band pass filter of 1-70Hz , sensitivity of 7 uV/mm, display speed of 53mm/sec with a 60Hz  notched filter applied as appropriate. EEG data were recorded continuously and digitally stored.  Video monitoring was available and reviewed as appropriate. Description: The posterior dominant rhythm consists of 8 Hz activity of moderate voltage (25-35 uV) seen predominantly in posterior head regions, symmetric and reactive to eye opening and eye closing. Sleep was characterized by vertex waves, sleep spindles (12 to 14 Hz), maximal frontocentral region.  Hyperventilation and photic stimulation were not performed.   IMPRESSION: This study is within normal limits. No seizures or epileptiform discharges were seen throughout the recording. A normal interictal EEG does not exclude the diagnosis of epilepsy. Priyanka O Yadav   VAS US  LOWER EXTREMITY VENOUS (DVT) Result Date: 12/08/2023  Lower Venous DVT Study Patient Name:  Alicia Brennan  Date of Exam:   12/07/2023 Medical Rec #: [id number]       Accession #:    [id number] Date of Birth: [birth year]        Patient Gender: F Patient Age:   64 years Exam Location:  [facility] Procedure:      VAS US  LOWER EXTREMITY VENOUS (DVT) Referring Phys: RAMESH KC --------------------------------------------------------------------------------  Indications: Edema.  Limitations: Body habitus, poor  ultrasound/tissue interface and Skin texture. Comparison Study: No prior study on file Performing Technologist: Alberta Lis RVS  Examination Guidelines: A complete evaluation includes B-mode imaging, spectral Doppler, color Doppler, and power Doppler as needed of all accessible portions of each vessel. Bilateral testing is considered an integral part of a complete examination. Limited examinations for reoccurring indications may be performed as noted. The reflux portion of the exam is performed with the patient in reverse Trendelenburg.  +---------+---------------+---------+-----------+----------+-------------------+ RIGHT    CompressibilityPhasicitySpontaneityPropertiesThrombus Aging      +---------+---------------+---------+-----------+----------+-------------------+ CFV      Full           Yes      No                                       +---------+---------------+---------+-----------+----------+-------------------+ SFJ      Full                                                             +---------+---------------+---------+-----------+----------+-------------------+ FV Prox  Full  Yes      No                                       +---------+---------------+---------+-----------+----------+-------------------+ FV Mid   Full                                                             +---------+---------------+---------+-----------+----------+-------------------+ FV Distal               Yes      No                   patent by color and                                                       Doppler             +---------+---------------+---------+-----------+----------+-------------------+ PFV      Full           Yes      No                                       +---------+---------------+---------+-----------+----------+-------------------+ POP      Full           Yes      No                                        +---------+---------------+---------+-----------+----------+-------------------+ PTV      Full                                                             +---------+---------------+---------+-----------+----------+-------------------+ PERO                                                  Not well visualized +---------+---------------+---------+-----------+----------+-------------------+   +---------+---------------+---------+-----------+----------+-------------------+ LEFT     CompressibilityPhasicitySpontaneityPropertiesThrombus Aging      +---------+---------------+---------+-----------+----------+-------------------+ CFV      Full           Yes      No                                       +---------+---------------+---------+-----------+----------+-------------------+ SFJ      Full                                                             +---------+---------------+---------+-----------+----------+-------------------+  FV Prox  Full           Yes      No                                       +---------+---------------+---------+-----------+----------+-------------------+ FV Mid                                                patent by color     +---------+---------------+---------+-----------+----------+-------------------+ FV Distal                                             patent by color and                                                       Doppler             +---------+---------------+---------+-----------+----------+-------------------+ PFV      Full                                                             +---------+---------------+---------+-----------+----------+-------------------+ POP                     Yes      No                   patent by color and                                                       Doppler             +---------+---------------+---------+-----------+----------+-------------------+  PTV                                                   Not well visualized +---------+---------------+---------+-----------+----------+-------------------+ PERO                                                  Not well visualized +---------+---------------+---------+-----------+----------+-------------------+     Summary: RIGHT: - There is no evidence of deep vein thrombosis in the lower extremity. However, portions of this examination were limited- see technologist comments above.  LEFT: - There is no evidence of deep vein thrombosis in the lower extremity. However, portions of this examination were limited- see technologist comments above.  *See table(s) above for measurements and observations. Electronically signed by Fonda Rim  on 12/08/2023 at 8:25:21 AM.    Final    US  Abdomen Limited RUQ (LIVER/GB) Result Date: 12/06/2023 CLINICAL DATA:  Edema EXAM: ULTRASOUND ABDOMEN LIMITED RIGHT UPPER QUADRANT COMPARISON:  None Available. FINDINGS: Gallbladder: Prior cholecystectomy Common bile duct: Diameter: Normal caliber, 4 mm Liver: Increased echotexture compatible with fatty infiltration. No focal abnormality or biliary ductal dilatation. Portal vein is patent on color Doppler imaging with normal direction of blood flow towards the liver. Other: None. IMPRESSION: Prior cholecystectomy. Fatty liver. No acute findings. Electronically Signed   By: Franky Crease M.D.   On: 12/06/2023 19:30   CT Cervical Spine Wo Contrast Result Date: 12/06/2023 CLINICAL DATA:  Syncope. EXAM: CT CERVICAL SPINE WITHOUT CONTRAST TECHNIQUE: Multidetector CT imaging of the cervical spine was performed without intravenous contrast. Multiplanar CT image reconstructions were also generated. RADIATION DOSE REDUCTION: This exam was performed according to the departmental dose-optimization program which includes automated exposure control, adjustment of the mA and/or kV according to patient size and/or use of iterative  reconstruction technique. COMPARISON:  March 31, 2015 FINDINGS: Alignment: There is 1 mm to 2 mm retrolisthesis of the C3 vertebral body on C4. Approximately 1 mm anterolisthesis of C6 is seen on C7. Skull base and vertebrae: No acute fracture. No primary bone lesion or focal pathologic process. Soft tissues and spinal canal: No prevertebral fluid or swelling. No visible canal hematoma. Disc levels: Marked severity endplate sclerosis, moderate severity anterior osteophyte formation and posterior bony spurring are seen at the levels of C3-C4, C4-C5, C5-C6 and C6-C7. Marked severity intervertebral disc space narrowing is present at C3-C4, C4-C5, C5-C6 and C6-C7. Bilateral moderate to marked severity multilevel facet joint hypertrophy is noted. Upper chest: An 11 mm pulmonary nodule is seen along the medial aspect of the right apex (axial CT image 84, CT series 9). This measures 7 mm on the prior study. Other: None. IMPRESSION: 1. Marked severity multilevel degenerative changes, as described above. 2. No acute cervical spine fracture. 3. 11 mm right apical pulmonary nodule, as described above. Correlation with dedicated nonemergent chest CT is recommended. Electronically Signed   By: Suzen Dials M.D.   On: 12/06/2023 14:53   CT Head Wo Contrast Result Date: 12/06/2023 CLINICAL DATA:  Syncope. EXAM: CT HEAD WITHOUT CONTRAST TECHNIQUE: Contiguous axial images were obtained from the base of the skull through the vertex without intravenous contrast. RADIATION DOSE REDUCTION: This exam was performed according to the departmental dose-optimization program which includes automated exposure control, adjustment of the mA and/or kV according to patient size and/or use of iterative reconstruction technique. COMPARISON:  September 12, 2023 FINDINGS: Brain: There is generalized cerebral atrophy with widening of the extra-axial spaces and ventricular dilatation. There are areas of decreased attenuation within the white matter  tracts of the supratentorial brain, consistent with microvascular disease changes. Vascular: No hyperdense vessel or unexpected calcification. Skull: Normal. Negative for fracture or focal lesion. Sinuses/Orbits: No acute finding. Other: None. IMPRESSION: 1. Generalized cerebral atrophy and microvascular disease changes of the supratentorial brain. 2. No acute intracranial abnormality. Electronically Signed   By: Suzen Dials M.D.   On: 12/06/2023 14:47   DG Chest Portable 1 View Result Date: 12/06/2023 CLINICAL DATA:  Shortness of breath. EXAM: PORTABLE CHEST 1 VIEW COMPARISON:  09/12/2023. FINDINGS: The heart size and mediastinal contours are within normal limits. Aortic atherosclerosis. No overt pulmonary edema, focal consolidation, pleural effusion, or pneumothorax. No acute osseous abnormality. IMPRESSION: No acute cardiopulmonary findings. Electronically Signed   By: Harrietta Sherry  M.D.   On: 12/06/2023 14:30   DG Hip Unilat W or Wo Pelvis 2-3 Views Right Result Date: 12/06/2023 CLINICAL DATA:  Slipped from couch.  Leg swelling. EXAM: DG HIP (WITH OR WITHOUT PELVIS) 2-3V RIGHT COMPARISON:  11/15/2015. FINDINGS: There is no evidence of acute fracture or dislocation. Status post bilateral total hip arthroplasties with normal alignment. Hardware appears intact. Sacroiliac joints and pubic symphysis appear anatomically aligned. Degenerative changes of the visualized lower lumbar spine. IMPRESSION: 1. No acute osseous abnormality. 2. 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 Electronically Signed   By: Harrietta Sherry M.D.   On: 12/06/2023 14:29     Subjective: No complaints  Discharge Exam: Vitals:   12/11/23 0421 12/11/23 0734  BP: (!) 142/69 (!) 151/69  Pulse: (!) 59 68  Resp: 19 19  Temp: 98.7 F (37.1 C) 97.8 F (36.6 C)  SpO2: 96% 99%   Vitals:   12/10/23 1958 12/10/23 2332 12/11/23 0421 12/11/23 0734  BP: 134/69 131/71 (!) 142/69 (!) 151/69  Pulse: 63 (!) 59  (!) 59 68  Resp: 18 19 19 19   Temp: 98.1 F (36.7 C) 97.8 F (36.6 C) 98.7 F (37.1 C) 97.8 F (36.6 C)  TempSrc: Oral   Oral  SpO2: 98% 95% 96% 99%  Weight:      Height:        General: Pt is alert, awake, not in acute distress Cardiovascular: RRR, S1/S2 +, no rubs, no gallops Respiratory: CTA bilaterally, no wheezing, no rhonchi Abdominal: Soft, NT, ND, bowel sounds + Extremities: no edema, no cyanosis    The results of significant diagnostics from this hospitalization (including imaging, microbiology, ancillary and laboratory) are listed below for reference.     Microbiology: No results found for this or any previous visit (from the past 240 hours).   Labs: BNP (last 3 results) Recent Labs    09/12/23 1138  BNP 45.2   Basic Metabolic Panel: Recent Labs  Lab 12/06/23 1658 12/07/23 0500  NA 135 135  K 3.9 4.3  CL 103 103  CO2 21* 21*  GLUCOSE 103* 93  BUN 8 10  CREATININE 0.68 0.83  CALCIUM 9.4 9.0   Liver Function Tests: Recent Labs  Lab 12/06/23 1658  AST 41  ALT 21  ALKPHOS 113  BILITOT 0.7  PROT 8.2*  ALBUMIN  3.8   Recent Labs  Lab 12/06/23 1658  LIPASE 27   Recent Labs  Lab 12/06/23 1658 12/07/23 0626  AMMONIA 54* 54*   CBC: Recent Labs  Lab 12/06/23 1658 12/07/23 0500  WBC 3.9* 3.6*  NEUTROABS 1.8  --   HGB 14.0 13.5  HCT 44.8 43.4  MCV 93.5 94.1  PLT 134* 128*   Cardiac Enzymes: No results for input(s): CKTOTAL, CKMB, CKMBINDEX, TROPONINI in the last 168 hours. BNP: Invalid input(s): POCBNP CBG: No results for input(s): GLUCAP in the last 168 hours. D-Dimer No results for input(s): DDIMER in the last 72 hours. Hgb A1c No results for input(s): HGBA1C in the last 72 hours. Lipid Profile No results for input(s): CHOL, HDL, LDLCALC, TRIG, CHOLHDL, LDLDIRECT in the last 72 hours. Thyroid function studies No results for input(s): TSH, T4TOTAL, T3FREE, THYROIDAB in the last 72  hours.  Invalid input(s): FREET3 Anemia work up No results for input(s): VITAMINB12, FOLATE, FERRITIN, TIBC, IRON, RETICCTPCT in the last 72 hours. Urinalysis    Component Value Date/Time   COLORURINE YELLOW 12/06/2023 1829   APPEARANCEUR CLEAR 12/06/2023 1829   APPEARANCEUR Clear 05/17/2013  1525   LABSPEC 1.012 12/06/2023 1829   LABSPEC 1.019 05/17/2013 1525   PHURINE 7.0 12/06/2023 1829   GLUCOSEU NEGATIVE 12/06/2023 1829   GLUCOSEU Negative 05/17/2013 1525   HGBUR NEGATIVE 12/06/2023 1829   BILIRUBINUR NEGATIVE 12/06/2023 1829   BILIRUBINUR Negative 05/17/2013 1525   KETONESUR NEGATIVE 12/06/2023 1829   PROTEINUR NEGATIVE 12/06/2023 1829   NITRITE NEGATIVE 12/06/2023 1829   LEUKOCYTESUR NEGATIVE 12/06/2023 1829   LEUKOCYTESUR Trace 05/17/2013 1525   Sepsis Labs Recent Labs  Lab 12/06/23 1658 12/07/23 0500  WBC 3.9* 3.6*   Microbiology No results found for this or any previous visit (from the past 240 hours).   Time coordinating discharge: Over 35 minutes  SIGNED:   Erle Odell Castor, MD  [facility] 12/11/2023, 8:34 AM Pager   If 7PM-7AM, please contact night-coverage [email or web address] Password [email or web address]

## 2023-12-11 NOTE — Plan of Care (Signed)
   Problem: Education: Goal: Knowledge of General Education information will improve Description Including pain rating scale, medication(s)/side effects and non-pharmacologic comfort measures Outcome: Progressing

## 2023-12-11 NOTE — Progress Notes (Signed)
 Physical Therapy Treatment Patient Details Name: Alicia Brennan MRN: [id number] DOB: 03-13-1951 Today's Date: 12/11/2023   History of Present Illness Pt is a 73 yo female presented [facility]23 with AMS and LE swelling. Dopplers neg for DVT.  PMH: pulmonary sarcoidosis, IBS, HTN, dementia, and seizures    PT Comments  Pt received in supine and eager for mobility. Pt demonstrates good progress towards mobility goals this session. Pt able to perform bed mobility and transfers with grossly mod A. Pt requires increased cues for sequencing and initiation throughout session. Pt able to take slow pivotal steps this session and perform static standing marches, but is limited by weakness and fatigue. Pt continues to benefit from PT services to progress toward functional mobility goals.     If plan is discharge home, recommend the following: Two people to help with walking and/or transfers;A lot of help with bathing/dressing/bathroom;Assistance with cooking/housework;Assist for transportation;Help with stairs or ramp for entrance   Can travel by private vehicle     No  Equipment Recommendations  Deitra lift (if going home)    Recommendations for Other Services       Precautions / Restrictions Precautions Precautions: Fall Recall of Precautions/Restrictions: Impaired Precaution/Restrictions Comments: hx of vascular dementia Restrictions Weight Bearing Restrictions Per Provider Order: No     Mobility  Bed Mobility   Bed Mobility: Supine to Sit     Supine to sit: Mod assist, HOB elevated, Used rails     General bed mobility comments: increased time and cues for technique    Transfers Overall transfer level: Needs assistance Equipment used: Rolling walker (2 wheels) Transfers: Sit to/from Stand, Bed to chair/wheelchair/BSC Sit to Stand: Mod assist   Step pivot transfers: Min assist       General transfer comment: From EOB and recliner with mod A for power up and anterior  weight shift. Dense cues for anterior weight shift and hip extension. Pivot to recliner with cues for sequencing and assist for stability and RW management. Pt demonstrates slow, short steps    Ambulation/Gait                   Stairs             Wheelchair Mobility     Tilt Bed    Modified Rankin (Stroke Patients Only)       Balance Overall balance assessment: Needs assistance Sitting-balance support: Single extremity supported, Feet supported Sitting balance-Leahy Scale: Fair Sitting balance - Comments: CGA sitting EOB with initial posterior lean   Standing balance support: Bilateral upper extremity supported, Reliant on assistive device for balance, During functional activity Standing balance-Leahy Scale: Poor Standing balance comment: with RW support. posterior lean initially requiring cues and assist to correct                            Communication Communication Communication: No apparent difficulties  Cognition Arousal: Alert Behavior During Therapy: WFL for tasks assessed/performed   PT - Cognitive impairments: History of cognitive impairments                         Following commands: Impaired Following commands impaired: Follows one step commands with increased time    Cueing Cueing Techniques: Verbal cues, Gestural cues, Tactile cues  Exercises General Exercises - Lower Extremity Heel Slides: AAROM, Supine, Both, 5 reps    General Comments  Pertinent Vitals/Pain Pain Assessment Pain Assessment: Faces Faces Pain Scale: Hurts a little bit Pain Location: BLE     PT Goals (current goals can now be found in the care plan section) Acute Rehab PT Goals Patient Stated Goal: get stronger PT Goal Formulation: With patient Time For Goal Achievement: 12/22/23 Progress towards PT goals: Progressing toward goals    Frequency    Min 2X/week       AM-PAC PT 6 Clicks Mobility   Outcome Measure  Help  needed turning from your back to your side while in a flat bed without using bedrails?: A Lot Help needed moving from lying on your back to sitting on the side of a flat bed without using bedrails?: A Lot Help needed moving to and from a bed to a chair (including a wheelchair)?: A Lot Help needed standing up from a chair using your arms (e.g., wheelchair or bedside chair)?: A Lot Help needed to walk in hospital room?: Total Help needed climbing 3-5 steps with a railing? : Total 6 Click Score: 10    End of Session Equipment Utilized During Treatment: Gait belt Activity Tolerance: Patient limited by fatigue;Patient tolerated treatment well Patient left: with call bell/phone within reach;in chair;with chair alarm set Nurse Communication: Mobility status PT Visit Diagnosis: Muscle weakness (generalized) (M62.81);History of falling (Z91.81);Difficulty in walking, not elsewhere classified (R26.2);Dizziness and giddiness (R42)     Time: 9140-9067 PT Time Calculation (min) (ACUTE ONLY): 33 min  Charges:    $Therapeutic Activity: 23-37 mins PT General Charges $$ ACUTE PT VISIT: 1 Visit                     Darryle George, PTA Acute Rehabilitation Services Secure Chat Preferred  [phone]    Darryle George 12/11/2023, 10:24 AM

## 2023-12-11 NOTE — Plan of Care (Signed)
 Assisted with therapy to chair. Continent of bowel. Foods well tolerated. Aware she is going to facility today.   Problem: Education: Goal: Knowledge of General Education information will improve Description: Including pain rating scale, medication(s)/side effects and non-pharmacologic comfort measures Outcome: Progressing   Problem: Nutrition: Goal: Adequate nutrition will be maintained Outcome: Progressing   Problem: Safety: Goal: Verbalization of understanding the information provided will improve Outcome: Progressing

## 2023-12-11 NOTE — TOC Transition Note (Signed)
 Transition of Care [facility]) - Discharge Note   Patient Details  Name: Alicia Brennan MRN: [id number] Date of Birth: [birth year]  [facility] of Care [facility]) CM/SW Contact:  Alicia CHRISTELLA Goodie, LCSW Phone Number: 12/11/2023, 1:31 PM   Clinical Narrative:   CSW spoke with daughter, [name], to update that MD had discharged, and only bed offer was [facility]. [name] asked for time to contact [employer]. CSW contacted back later that [facility] is not able to take the patient, so family in agreement with [facility]. CSW coordinated with PT assigned for updated note, and contacted CMA to initiate insurance authorization. Patient approved. Transport arranged with [facility] for next available.  Nurse to call report to [phone], Room 107b.    Final next level of care: Skilled Nursing Facility Barriers to Discharge: Barriers Resolved   Patient Goals and CMS Choice Patient states their goals for this hospitalization and ongoing recovery are:: to get rehab CMS Medicare.gov Compare Post Acute Care list provided to:: Patient Represenative (must comment) Choice offered to / list presented to : Adult Children [place] ownership interest in [facility].provided to:: Adult Children    Discharge Placement              Patient chooses bed at: [facility] Patient to be transferred to facility by: [facility] Name of family member notified: [name] Patient and family notified of of transfer: 12/11/23  Discharge Plan and Services Additional resources added to the After Visit Summary for       Post Acute Care Choice: Skilled Nursing Facility                               Social Drivers of Health (SDOH) Interventions SDOH Screenings   Food Insecurity: No Food Insecurity (12/10/2023)  Housing: Low Risk  (12/10/2023)  Transportation Needs: No Transportation Needs (11/16/2023)   Received from [facility]  Utilities: Low Risk  (11/16/2023)   Received  from [facility]  Financial Resource Strain: Low Risk  (11/16/2023)   Received from [facility]  Tobacco Use: Low Risk  (12/07/2023)     Readmission Risk Interventions     No data to display

## 2024-01-01 NOTE — Telephone Encounter (Signed)
 Called [facility] to get a list of patient current medications.  After being on-hold for 20 minutes, hung up and called patient's daughter, Judson.  Shaneice reports patient has been sick the past couple of days with vomiting. She is confused and very sleepy.  Shaneice states that she asked nursing home staff to send patient back to the hospital, and she was told that she needs to sign her mother out and take her to the hospital herself.  Shaneice is not comfortable taking patient out of the nursing home.  Shaneice reports patient is still taking Depakote .    Judson is planning to go to the SNF today and she will ask for a list of patient medications and recent lab results that were drawn at the SNF.

## 2024-01-01 NOTE — Telephone Encounter (Signed)
 Hi Faith Can you ask the SNF to fax her medication list to us  for review.  I think she needs to be off of Depakote  due to ammonia level elevation which may increase seizure, lethargy and fall.  Thanks [employer]

## 2024-01-02 ENCOUNTER — Inpatient Hospital Stay (HOSPITAL_COMMUNITY)
Admission: EM | Admit: 2024-01-02 | Discharge: 2024-01-10 | DRG: 660 | Disposition: A | Discharge status: Skilled Nursing Facility | Source: Skilled Nursing Facility | Attending: Internal Medicine | Admitting: Internal Medicine

## 2024-01-02 ENCOUNTER — Emergency Department (HOSPITAL_COMMUNITY)

## 2024-01-02 ENCOUNTER — Other Ambulatory Visit: Payer: Self-pay

## 2024-01-02 ENCOUNTER — Encounter (HOSPITAL_COMMUNITY): Payer: Self-pay

## 2024-01-02 DIAGNOSIS — Z8616 Personal history of COVID-19: Secondary | ICD-10-CM | POA: Diagnosis not present

## 2024-01-02 DIAGNOSIS — N1330 Unspecified hydronephrosis: Secondary | ICD-10-CM | POA: Diagnosis not present

## 2024-01-02 DIAGNOSIS — N179 Acute kidney failure, unspecified: Secondary | ICD-10-CM

## 2024-01-02 DIAGNOSIS — Z9049 Acquired absence of other specified parts of digestive tract: Secondary | ICD-10-CM | POA: Diagnosis not present

## 2024-01-02 DIAGNOSIS — Z91138 Patient's unintentional underdosing of medication regimen for other reason: Secondary | ICD-10-CM

## 2024-01-02 DIAGNOSIS — R5383 Other fatigue: Secondary | ICD-10-CM

## 2024-01-02 DIAGNOSIS — E876 Hypokalemia: Secondary | ICD-10-CM | POA: Diagnosis not present

## 2024-01-02 DIAGNOSIS — R9431 Abnormal electrocardiogram [ECG] [EKG]: Secondary | ICD-10-CM | POA: Diagnosis present

## 2024-01-02 DIAGNOSIS — T50916A Underdosing of multiple unspecified drugs, medicaments and biological substances, initial encounter: Secondary | ICD-10-CM | POA: Diagnosis present

## 2024-01-02 DIAGNOSIS — Z96643 Presence of artificial hip joint, bilateral: Secondary | ICD-10-CM | POA: Diagnosis present

## 2024-01-02 DIAGNOSIS — G40919 Epilepsy, unspecified, intractable, without status epilepticus: Secondary | ICD-10-CM | POA: Diagnosis not present

## 2024-01-02 DIAGNOSIS — D869 Sarcoidosis, unspecified: Secondary | ICD-10-CM | POA: Diagnosis present

## 2024-01-02 DIAGNOSIS — G9345 Developmental and epileptic encephalopathy: Secondary | ICD-10-CM | POA: Diagnosis present

## 2024-01-02 DIAGNOSIS — B9620 Unspecified Escherichia coli [E. coli] as the cause of diseases classified elsewhere: Secondary | ICD-10-CM | POA: Diagnosis present

## 2024-01-02 DIAGNOSIS — N132 Hydronephrosis with renal and ureteral calculous obstruction: Secondary | ICD-10-CM | POA: Diagnosis not present

## 2024-01-02 DIAGNOSIS — E860 Dehydration: Secondary | ICD-10-CM | POA: Diagnosis present

## 2024-01-02 DIAGNOSIS — G40909 Epilepsy, unspecified, not intractable, without status epilepticus: Secondary | ICD-10-CM | POA: Diagnosis present

## 2024-01-02 DIAGNOSIS — R569 Unspecified convulsions: Principal | ICD-10-CM

## 2024-01-02 DIAGNOSIS — F0150 Vascular dementia without behavioral disturbance: Secondary | ICD-10-CM | POA: Diagnosis present

## 2024-01-02 DIAGNOSIS — Z85048 Personal history of other malignant neoplasm of rectum, rectosigmoid junction, and anus: Secondary | ICD-10-CM | POA: Diagnosis not present

## 2024-01-02 DIAGNOSIS — I160 Hypertensive urgency: Secondary | ICD-10-CM | POA: Diagnosis present

## 2024-01-02 DIAGNOSIS — I10 Essential (primary) hypertension: Secondary | ICD-10-CM | POA: Diagnosis present

## 2024-01-02 DIAGNOSIS — Z79899 Other long term (current) drug therapy: Secondary | ICD-10-CM | POA: Diagnosis not present

## 2024-01-02 DIAGNOSIS — N136 Pyonephrosis: Secondary | ICD-10-CM | POA: Diagnosis present

## 2024-01-02 DIAGNOSIS — G9340 Encephalopathy, unspecified: Secondary | ICD-10-CM | POA: Diagnosis not present

## 2024-01-02 DIAGNOSIS — N390 Urinary tract infection, site not specified: Secondary | ICD-10-CM

## 2024-01-02 LAB — I-STAT CHEM 8, ED
BUN: 16 mg/dL (ref 8–23)
Calcium, Ion: 1.19 mmol/L (ref 1.15–1.40)
Chloride: 97 mmol/L — ABNORMAL LOW (ref 98–111)
Creatinine, Ser: 1.4 mg/dL — ABNORMAL HIGH (ref 0.44–1.00)
Glucose, Bld: 116 mg/dL — ABNORMAL HIGH (ref 70–99)
HCT: 47 % — ABNORMAL HIGH (ref 36.0–46.0)
Hemoglobin: 16 g/dL — ABNORMAL HIGH (ref 12.0–15.0)
Potassium: 4 mmol/L (ref 3.5–5.1)
Sodium: 136 mmol/L (ref 135–145)
TCO2: 29 mmol/L (ref 22–32)

## 2024-01-02 LAB — COMPREHENSIVE METABOLIC PANEL WITH GFR
ALT: 31 U/L (ref 0–44)
AST: 58 U/L — ABNORMAL HIGH (ref 15–41)
Albumin: 3.9 g/dL (ref 3.5–5.0)
Alkaline Phosphatase: 113 U/L (ref 38–126)
Anion gap: 10 (ref 5–15)
BUN: 15 mg/dL (ref 8–23)
CO2: 28 mmol/L (ref 22–32)
Calcium: 9.9 mg/dL (ref 8.9–10.3)
Chloride: 96 mmol/L — ABNORMAL LOW (ref 98–111)
Creatinine, Ser: 1.11 mg/dL — ABNORMAL HIGH (ref 0.44–1.00)
GFR, Estimated: 52 mL/min — ABNORMAL LOW (ref >60)
Glucose, Bld: 110 mg/dL — ABNORMAL HIGH (ref 70–99)
Potassium: 3.9 mmol/L (ref 3.5–5.1)
Sodium: 135 mmol/L (ref 135–145)
Total Bilirubin: 1.1 mg/dL (ref 0.0–1.2)
Total Protein: 8.1 g/dL (ref 6.5–8.1)

## 2024-01-02 LAB — URINALYSIS, ROUTINE W REFLEX MICROSCOPIC
Bacteria, UA: NONE SEEN
Bilirubin Urine: NEGATIVE
Glucose, UA: NEGATIVE mg/dL
Ketones, ur: NEGATIVE mg/dL
Nitrite: NEGATIVE
Protein, ur: 100 mg/dL — AB
RBC / HPF: >50 RBC/hpf (ref 0–5)
Specific Gravity, Urine: 1.01 (ref 1.005–1.030)
WBC, UA: >50 WBC/hpf (ref 0–5)
pH: 8 (ref 5.0–8.0)

## 2024-01-02 LAB — CBC
HCT: 47.1 % — ABNORMAL HIGH (ref 36.0–46.0)
Hemoglobin: 14.8 g/dL (ref 12.0–15.0)
MCH: 28.8 pg (ref 26.0–34.0)
MCHC: 31.4 g/dL (ref 30.0–36.0)
MCV: 91.8 fL (ref 80.0–100.0)
Platelets: 157 K/uL (ref 150–400)
RBC: 5.13 MIL/uL — ABNORMAL HIGH (ref 3.87–5.11)
RDW: 13.3 % (ref 11.5–15.5)
WBC: 7.2 K/uL (ref 4.0–10.5)
nRBC: 0 % (ref 0.0–0.2)

## 2024-01-02 LAB — AMMONIA: Ammonia: 16 umol/L (ref 9–35)

## 2024-01-02 LAB — CBG MONITORING, ED: Glucose-Capillary: 107 mg/dL — ABNORMAL HIGH (ref 70–99)

## 2024-01-02 MED ORDER — HYDRALAZINE HCL 25 MG PO TABS
25.0000 mg | ORAL_TABLET | Freq: Three times a day (TID) | ORAL | Status: DC
  Administered 2024-01-02 – 2024-01-10 (×22): 25 mg via ORAL
  Filled 2024-01-02 (×23): qty 1

## 2024-01-02 MED ORDER — LACOSAMIDE 50 MG PO TABS: 50.0000 mg | ORAL_TABLET | Freq: Two times a day (BID) | ORAL | Status: DC

## 2024-01-02 MED ORDER — LEVETIRACETAM (KEPPRA) 500 MG/5 ML ADULT IV PUSH
500.0000 mg | Freq: Once | INTRAVENOUS | Status: AC
  Administered 2024-01-02: 500 mg via INTRAVENOUS
  Filled 2024-01-02: qty 5

## 2024-01-02 MED ORDER — SODIUM CHLORIDE 0.9 % IV SOLN
1.0000 g | INTRAVENOUS | Status: DC
  Administered 2024-01-03: 1 g via INTRAVENOUS
  Filled 2024-01-02: qty 10

## 2024-01-02 MED ORDER — ACETAMINOPHEN 325 MG PO TABS
650.0000 mg | ORAL_TABLET | Freq: Four times a day (QID) | ORAL | Status: DC | PRN
  Filled 2024-01-02: qty 2

## 2024-01-02 MED ORDER — CEFTRIAXONE SODIUM 1 G IJ SOLR
1.0000 g | Freq: Once | INTRAMUSCULAR | Status: AC
  Administered 2024-01-02: 1 g via INTRAVENOUS
  Filled 2024-01-02: qty 10

## 2024-01-02 MED ORDER — HYDRALAZINE HCL 20 MG/ML IJ SOLN
2.0000 mg | Freq: Once | INTRAMUSCULAR | Status: AC
  Administered 2024-01-02: 2 mg via INTRAVENOUS
  Filled 2024-01-02: qty 1

## 2024-01-02 MED ORDER — LACOSAMIDE 200 MG/20ML IV SOLN: 50.0000 mg | Freq: Two times a day (BID) | INTRAVENOUS | Status: DC

## 2024-01-02 MED ORDER — IOHEXOL 300 MG/ML  SOLN
100.0000 mL | Freq: Once | INTRAMUSCULAR | Status: AC | PRN
  Administered 2024-01-02: 100 mL via INTRAVENOUS

## 2024-01-02 MED ORDER — LEVETIRACETAM (KEPPRA) 500 MG/5 ML ADULT IV PUSH: 500.0000 mg | Freq: Two times a day (BID) | INTRAVENOUS | Status: DC

## 2024-01-02 MED ORDER — VALPROATE SODIUM 100 MG/ML IV SOLN: 250.0000 mg | Freq: Three times a day (TID) | INTRAVENOUS | Status: DC

## 2024-01-02 MED ORDER — LEVETIRACETAM 500 MG PO TABS: 500.0000 mg | ORAL_TABLET | Freq: Two times a day (BID) | ORAL | Status: DC

## 2024-01-02 MED ORDER — ACETAMINOPHEN 650 MG RE SUPP: 650.0000 mg | Freq: Four times a day (QID) | RECTAL | Status: DC | PRN

## 2024-01-02 MED ORDER — SODIUM CHLORIDE 0.9 % IV BOLUS: 500.0000 mL | Freq: Once | INTRAVENOUS | Status: DC

## 2024-01-02 MED ORDER — LACOSAMIDE 50 MG PO TABS
50.0000 mg | ORAL_TABLET | Freq: Two times a day (BID) | ORAL | Status: DC
Start: 2024-01-02 — End: 2024-01-03
  Administered 2024-01-02 – 2024-01-03 (×2): 50 mg via ORAL
  Filled 2024-01-02 (×2): qty 1

## 2024-01-02 MED ORDER — HYDRALAZINE HCL 20 MG/ML IJ SOLN
5.0000 mg | INTRAMUSCULAR | Status: DC | PRN
Start: 2024-01-02 — End: 2024-01-10

## 2024-01-02 MED ORDER — DIVALPROEX SODIUM 250 MG PO DR TAB
250.0000 mg | DELAYED_RELEASE_TABLET | Freq: Three times a day (TID) | ORAL | Status: DC
  Administered 2024-01-02: 250 mg via ORAL
  Filled 2024-01-02: qty 1

## 2024-01-02 MED ORDER — DIVALPROEX SODIUM 250 MG PO DR TAB
250.0000 mg | DELAYED_RELEASE_TABLET | Freq: Three times a day (TID) | ORAL | Status: DC
Start: 2024-01-02 — End: 2024-01-02

## 2024-01-02 MED ORDER — LEVETIRACETAM 500 MG PO TABS
500.0000 mg | ORAL_TABLET | Freq: Two times a day (BID) | ORAL | Status: DC
Start: 2024-01-03 — End: 2024-01-03
  Administered 2024-01-03: 500 mg via ORAL
  Filled 2024-01-02: qty 1

## 2024-01-02 MED ORDER — DIVALPROEX SODIUM 250 MG PO DR TAB
250.0000 mg | DELAYED_RELEASE_TABLET | Freq: Three times a day (TID) | ORAL | Status: DC
  Administered 2024-01-03: 250 mg via ORAL
  Filled 2024-01-02: qty 1

## 2024-01-02 MED ORDER — LAMOTRIGINE 25 MG PO TABS
25.0000 mg | ORAL_TABLET | Freq: Two times a day (BID) | ORAL | Status: DC
  Administered 2024-01-02 – 2024-01-10 (×16): 25 mg via ORAL
  Filled 2024-01-02 (×16): qty 1

## 2024-01-02 MED ORDER — SODIUM CHLORIDE 0.9 % IV BOLUS
1000.0000 mL | Freq: Once | INTRAVENOUS | Status: AC
  Administered 2024-01-02: 1000 mL via INTRAVENOUS

## 2024-01-02 NOTE — ED Provider Notes (Signed)
 [place] EMERGENCY DEPARTMENT AT [facility] Provider Note   CSN: [id number] Arrival date & time: 01/02/24  1154     Patient presents with: Weakness   Jamirah Zelaya is a 73 y.o. female.  Weakness Associated symptoms: abdominal pain, nausea and vomiting   Patient is a 73 year old female presenting ED today for concerns for altered mental status, weakness, possible seizure witnessed by daughter earlier today who is at bedside.  Notes that she has been progressively weaker over the last week, diagnosed with possible UTI last week with no medications given as well as having had COVID 2 weeks ago.   Previous medical history of Sarcoidosis, IBS, rectal cancer, HTN, seizures, avascular necrosis, encephalopathy, vascular dementia.   Daughter concerned that patient may or may not be receiving treatment or medications at this current facility, coming from [facility].  Spoke with facility who noted that she had also had some transabdominal pain with episodes of nausea and vomiting for the last couple of days, noted to have not had any repeat episodes today but had noticed some general malaise from patient.  Noted to be near baseline mental status per nursing.  Provider at facility was going to do routine labs but patient was sent to the ED due to daughter being adamant that she be sent out.  Staff deny fever, cough, congestion, reported chest pain.     Prior to Admission medications   Medication Sig Start Date End Date Taking? Authorizing Provider  divalproex  (DEPAKOTE ) 250 MG DR tablet Take 1 tablet (250 mg total) by mouth 3 (three) times daily. Patient taking differently: Take 250 mg by mouth See admin instructions. Take 250 mg by mouth at 6 AM, 2 PM, and 10 PM 03/25/18  Yes Harris, Abigail, PA-C  furosemide  (LASIX ) 40 MG tablet Take 1 tablet (40 mg total) by mouth daily. 12/11/23  Yes Odell Celinda Balo, MD  hydrALAZINE  (APRESOLINE ) 25 MG tablet Take 1 tablet (25 mg  total) by mouth every 8 (eight) hours. Patient taking differently: Take 25 mg by mouth See admin instructions. Take 25 mg by mouth every eight hours and hold for a Systolic reading less than 100, Diastolic reading less than 90, or a heart rate less than 60 12/11/23  Yes Odell Celinda Balo, MD  lacosamide  (VIMPAT ) 50 MG TABS tablet Take 1 tablet (50 mg total) by mouth 2 (two) times daily. 09/13/23  Yes Patsy Lenis, MD  lactulose  (CHRONULAC ) 10 GM/15ML solution Take 15 mLs (10 g total) by mouth daily. 12/11/23  Yes Odell Celinda Balo, MD  lamoTRIgine  (LAMICTAL ) 25 MG tablet Take 25 mg by mouth 2 (two) times daily. 11/19/23 01/30/24 Yes [provider]  levETIRAcetam  (KEPPRA ) 500 MG tablet Take 1 tablet (500 mg total) by mouth 2 (two) times daily. 12/11/23  Yes Odell Celinda Balo, MD  NAYZILAM  5 MG/0.1ML SOLN GIVE ONE SPRAY INTO 1 NOSTRIL. IF NO RESPONSE IN 10 MINUTES, MAY GIVE SECOND SPRAY IN OTHER NOSTRIL. DO NOT GIVE SECOND DOSE IF PATIENT HAS TROUBLE BREATHING OR EXCESSIVE SEDATION, MAX 2 DOSES/SEIZURE EPISODE, 1 EPISODE EVERY 3 DAYS OR 5 EPISODES/MONTH Nasal for 6 Days 09/24/23  Yes [provider]  telmisartan (MICARDIS) 80 MG tablet Take 80 mg by mouth daily. 12/04/22  Yes [provider]    Allergies: Patient has no known allergies.    Review of Systems  Gastrointestinal:  Positive for abdominal pain, nausea and vomiting.  Neurological:  Positive for weakness.  All other systems reviewed and are negative.  Updated Vital Signs BP (!) 161/80 (BP Location: Right Arm)   Pulse 74   Temp (!) 96.8 F (36 C) (Oral)   Resp 18   Ht 5' 5 (1.651 m)   SpO2 99%   BMI 30.71 kg/m   Physical Exam Vitals and nursing note reviewed.  Constitutional:      General: She is not in acute distress.    Appearance: She is ill-appearing. She is not diaphoretic.  HENT:     Head: Normocephalic and atraumatic.  Eyes:     General: No scleral icterus.       Right eye: No  discharge.        Left eye: No discharge.     Extraocular Movements: Extraocular movements intact.     Conjunctiva/sclera: Conjunctivae normal.  Cardiovascular:     Rate and Rhythm: Normal rate and regular rhythm.     Pulses: Normal pulses.     Heart sounds: Normal heart sounds. No murmur heard.    No friction rub. No gallop.  Pulmonary:     Effort: Pulmonary effort is normal. No respiratory distress.     Breath sounds: No stridor. No wheezing, rhonchi or rales.  Chest:     Chest wall: No tenderness.  Abdominal:     General: Abdomen is flat. There is no distension.     Palpations: Abdomen is soft.     Tenderness: There is abdominal tenderness (Mild generalized abdominal tenderness noted to palpation with no localization). There is no guarding or rebound.  Musculoskeletal:        General: No swelling, deformity or signs of injury.     Cervical back: Normal range of motion. No rigidity.     Right lower leg: No edema.     Left lower leg: No edema.  Skin:    General: Skin is warm and dry.     Findings: No bruising, erythema or lesion.  Neurological:     Mental Status: She is alert.     Comments: Patient is alert and oriented x 1 to person.  Unable to perform neuroexam due to the altered mental status.  Able to move all limbs grossly at this time.  Psychiatric:        Mood and Affect: Mood normal.     (all labs ordered are listed, but only abnormal results are displayed) Labs Reviewed  COMPREHENSIVE METABOLIC PANEL WITH GFR - Abnormal; Notable for the following components:      Result Value   Chloride 96 (*)    Glucose, Bld 110 (*)    Creatinine, Ser 1.11 (*)    AST 58 (*)    GFR, Estimated 52 (*)    All other components within normal limits  CBC - Abnormal; Notable for the following components:   RBC 5.13 (*)    HCT 47.1 (*)    All other components within normal limits  URINALYSIS, ROUTINE W REFLEX MICROSCOPIC - Abnormal; Notable for the following components:   APPearance  TURBID (*)    Hgb urine dipstick LARGE (*)    Protein, ur 100 (*)    Leukocytes,Ua LARGE (*)    All other components within normal limits  CBG MONITORING, ED - Abnormal; Notable for the following components:   Glucose-Capillary 107 (*)    All other components within normal limits  I-STAT CHEM 8, ED - Abnormal; Notable for the following components:   Chloride 97 (*)    Creatinine, Ser 1.40 (*)    Glucose, Bld 116 (*)  Hemoglobin 16.0 (*)    HCT 47.0 (*)    All other components within normal limits  URINE CULTURE  CULTURE, BLOOD (ROUTINE X 2)  CULTURE, BLOOD (ROUTINE X 2)  AMMONIA  LAMOTRIGINE  LEVEL  LEVETIRACETAM  LEVEL  VALPROIC ACID  LEVEL    EKG: EKG Interpretation Date/Time:  Wednesday January 02 2024 14:09:53 EDT Ventricular Rate:  71 PR Interval:  42 QRS Duration:  118 QT Interval:  433 QTC Calculation: 471 R Axis:   -23  Text Interpretation: Sinus rhythm Short PR interval Right atrial enlargement Incomplete left bundle branch block Probable left ventricular hypertrophy Minimal ST elevation, inferior leads No significant change since last tracing Confirmed by Dean Clarity [phone]) on 01/02/2024 4:09:12 PM  Radiology: MR BRAIN WO CONTRAST Result Date: 01/02/2024 EXAM: MR Brain without Intravenous Contrast. CLINICAL HISTORY: Neuro deficit, acute, stroke suspected. Neuro deficit; pt has had weakness over the last week. TECHNIQUE: Magnetic resonance images of the brain without intravenous contrast in multiple planes. CONTRAST: Without. COMPARISON: CT head from earlier today. MRI head 09/12/2023. FINDINGS: BRAIN: No restricted diffusion to indicate acute infarction. No intracranial mass or hemorrhage. No midline shift or extra-axial fluid collection. The central arterial and venous flow voids are patent. Cerebral atrophy. Mild for age T2 FLAIR hyperintensities of the white matter, compatible with chronic microvascular ischemic changes. VENTRICLES: No hydrocephalus. ORBITS: The  orbits are normal. SINUSES AND MASTOIDS: The sinuses and mastoid air cells are clear. BONES: No acute fracture or focal osseous lesion. IMPRESSION: 1. No acute abnormality. Electronically signed by: Gilmore Molt MD 01/02/2024 07:00 PM EDT RP Workstation: [id number]   CT Head Wo Contrast Result Date: 01/02/2024 CLINICAL DATA:  Weakness and fatigue. EXAM: CT HEAD WITHOUT CONTRAST TECHNIQUE: Contiguous axial images were obtained from the base of the skull through the vertex without intravenous contrast. RADIATION DOSE REDUCTION: This exam was performed according to the departmental dose-optimization program which includes automated exposure control, adjustment of the mA and/or kV according to patient size and/or use of iterative reconstruction technique. COMPARISON:  December 06, 2023 FINDINGS: Brain: There is generalized cerebral atrophy with widening of the extra-axial spaces and ventricular dilatation. There are areas of decreased attenuation within the white matter tracts of the supratentorial brain, consistent with microvascular disease changes. Vascular: No hyperdense vessel or unexpected calcification. Skull: Normal. Negative for fracture or focal lesion. Sinuses/Orbits: No acute finding. Other: None. IMPRESSION: 1. Generalized cerebral atrophy and microvascular disease changes of the supratentorial brain. 2. No acute intracranial abnormality. Electronically Signed   By: Suzen Dials M.D.   On: 01/02/2024 16:17   CT ABDOMEN PELVIS W CONTRAST Result Date: 01/02/2024 CLINICAL DATA:  Acute nonlocalized abdominal pain. Increasing weakness over the past week. EXAM: CT ABDOMEN AND PELVIS WITH CONTRAST TECHNIQUE: Multidetector CT imaging of the abdomen and pelvis was performed using the standard protocol following bolus administration of intravenous contrast. RADIATION DOSE REDUCTION: This exam was performed according to the departmental dose-optimization program which includes automated exposure  control, adjustment of the mA and/or kV according to patient size and/or use of iterative reconstruction technique. CONTRAST:  100mL OMNIPAQUE IOHEXOL 300 MG/ML  SOLN COMPARISON:  05/05/2011 FINDINGS: Lower chest: Infiltration or atelectasis in the lung bases. Small esophageal hiatal hernia. Hepatobiliary: No focal liver lesions. Gallbladder is not visualized, possibly contracted or surgically absent. No bile duct dilatation. Pancreas: Unremarkable. No pancreatic ductal dilatation or surrounding inflammatory changes. Spleen: Normal in size without focal abnormality. Adrenals/Urinary Tract: No adrenal gland nodules. Kidneys are symmetrical. Moderate left hydronephrosis and hydroureter.  Hydroureter extends down to the level of the bladder. Unfortunately due to streak artifact from hip arthroplasties, the distal ureter and bladder are not well visualized. Changes could represent obstruction due to a nonvisualized distal ureteral stone or this could be due to reflux disease. The bladder is decompressed with a Foley catheter. Stomach/Bowel: Stomach, small bowel, and colon are not abnormally distended. No wall thickening or inflammatory stranding. Appendix is not identified. Vascular/Lymphatic: Aortic atherosclerosis. No enlarged abdominal or pelvic lymph nodes. Reproductive: Uterus and bilateral adnexa are unremarkable. Other: Small amount of free fluid in the pelvis is nonspecific, probably reactive. Broad-based ventral abdominal wall hernia at the umbilicus containing fat and transverse colon without proximal obstruction. Musculoskeletal: Degenerative changes in the spine. Mild anterior subluxation of L4 on L5 is likely degenerative. Bilateral total hip arthroplasties. IMPRESSION: 1. Left renal hydronephrosis and hydroureter to the level of the bladder. Distal ureter and bladder are not well visualized due to streak artifact. Changes may indicate obstruction due to a nonvisualized distal ureteral stone or reflux. 2. No  evidence of bowel obstruction or inflammation. 3. Mild aortic atherosclerosis. 4. Infiltration or atelectasis seen in both lung bases. 5. Small esophageal hiatal hernia. Broad-based ventral abdominal wall hernia containing colon but without proximal obstruction. Electronically Signed   By: Elsie Gravely M.D.   On: 01/02/2024 16:09   DG Chest 2 View Result Date: 01/02/2024 CLINICAL DATA:  Shortness of breath.  AMS. EXAM: CHEST - 2 VIEW COMPARISON:  12/06/2023. FINDINGS: The heart size and mediastinal contours are unchanged. Aortic atherosclerosis. No overt edema, focal consolidation, pleural effusion, or pneumothorax. No acute osseous abnormality. IMPRESSION: 1. No acute cardiopulmonary findings. 2.  Aortic Atherosclerosis (ICD10-I70.0). Electronically Signed   By: Harrietta Sherry M.D.   On: 01/02/2024 13:23    Procedures   Medications Ordered in the ED  levETIRAcetam  (KEPPRA ) undiluted injection 500 mg (has no administration in time range)  lamoTRIgine  (LAMICTAL ) tablet 25 mg (has no administration in time range)  divalproex  (DEPAKOTE ) DR tablet 250 mg (has no administration in time range)  sodium chloride  0.9 % bolus 500 mL (has no administration in time range)  sodium chloride  0.9 % bolus 1,000 mL (0 mLs Intravenous Stopped 01/02/24 1619)  iohexol (OMNIPAQUE) 300 MG/ML solution 100 mL (100 mLs Intravenous Contrast Given 01/02/24 1524)  hydrALAZINE  (APRESOLINE ) injection 2 mg (2 mg Intravenous Given 01/02/24 1602)  cefTRIAXone  (ROCEPHIN ) 1 g in sodium chloride  0.9 % 100 mL IVPB (0 g Intravenous Stopped 01/02/24 1756)    Clinical Course as of 01/02/24 2203  Wed Jan 02, 2024  1230 Spoke with facility, nurse Amber, who reports that daughter noticed tremors and possible seizures.  Noted that she had been complaining of abdominal pain for the last 2 to 3 days with 1 or 2 episodes of vomiting per day, not having any repeat episodes today.  However notes that she endorses general malaise and had not  been eating or drinking much today. Dx with Covid 2 weeks ago.  [CB]  2135 Spoke with hospitalist, Dr. Alfornia who requested urology consult and excepted patient care at this time. [CB]  2152 Spoke with urology Dr. Elisabeth, who wished for her to have Foley catheter done tonight to allow for optimal drainage.  And we will round on her tomorrow.  She requested that we make NPO tonight.  [CB]    Clinical Course User Index [CB] Beola Terrall RAMAN, PA-C   Medical Decision Making Amount and/or Complexity of Data Reviewed Labs: ordered. Radiology: ordered.  ECG/medicine tests: ordered.  Risk Prescription drug management. Decision regarding hospitalization.  This patient is a 73 year old female who presents to the ED for concern of altered mental status, generalized aminal pain, nausea and vomiting, lethargy and possible seizures x 2 to 3 days.  Daughter at bedside who noted that she had been diagnosed possible UTI a week ago with staff noting that they had also diagnosed her with COVID 2 weeks ago.  Noted to have seen repeat tremors.  Called facility and were uncertain if she was able to tolerate keeping her medications down with nausea vomiting over the last few days.  History of vascular dementia.  On physical exam, patient is in no acute distress, afebrile, alert and orient x 1 to person, nontachypneic, nontachycardic.  Abdomen is generally mildly tender with no localization.  Noted to have mild bilateral leg swelling with lymphedema skin changes.   Daughter suspicious that she had not been to take her medications due to vomiting.  However staff noted that she had been able to tolerate p.o. with no vomiting episodes today.  Spoke with Dr. Vanessa with neurology after negative head MRI and CT scan.  CT scan of the abdomen did note hydronephrosis to the left side.  With possible junction but with streak artifact was not able to visualize obstruction.  Put in a Foley catheter secondary to not having  In-N-Out cath.    UA did note elevated leukocytes, greater than 50 white blood cell count.  Provided Rocephin  after cultures done.  Spoke with urology who requested for remain n.p.o. and to undergo Foley catheter.  Reported that urology will round on her tomorrow.  Wished for her to be n.p.o. tonight in case they wish to intervene tomorrow.  Patient care was then transferred to Dr. Alfornia.   Differential diagnoses prior to evaluation: The emergent differential diagnosis includes, but is not limited to,  Drug-related, hypoxia, hyper/hypoglycemia, encephalopathy, sepsis, DKA/HHS, brain lesion, CVA, seizure, environmental, psychiatric, Mesenteric ischemia, diverticulitis, nephrolithiasis, constipation, bowel obstruction, IBD . This is not an exhaustive differential.   Past Medical History / Co-morbidities / Social History: Sarcoidosis, IBS, rectal cancer, HTN, seizures, avascular necrosis, encephalopathy, vascular dementia  Additional history: Chart reviewed. Pertinent results include:   Admitted to the hospital on 12/06/2023 for altered mental status.  Noted to have slightly elevated ammonia level at that time and placed on lactulose  with resolution of encephalopathy.  Seizures treated with Depakote , Vimpat , Keppra   Last followed by [facility] neurology and seen on 09/27/2023.  Noted to have previous MRIs and EEGs which were normal.  Lab Tests/Imaging studies: I personally interpreted labs/imaging and the pertinent results include:   CBC unremarkable CMP notes an elevated creatinine 1.11 and decreased GFR from previous as well as a mildly elevated AST 58.  UA did note large leukocytes and large hemoglobin with greater 50 white blood cells.  Concern for possible UTI, urine culture pending Blood cultures pending Ammonia unremarkable Keppra  level, valproic acid  level, lamotrigine  level pending MRI brain shows no acute findings CT head shows generalized atrophy with no other acute findings CT  abdomen does show hydronephrosis and hydroureter down to level the bladder on left side with unable to see obstruction that might be present due to artifact Chest x-ray unremarkable  I agree with the radiologist interpretation.  Cardiac monitoring: EKG obtained and interpreted by myself and attending physician which shows: Sinus rhythm with short PR interval  EKG Interpretation Date/Time:  Wednesday January 02 2024 14:09:53 EDT Ventricular Rate:  71  PR Interval:  42 QRS Duration:  118 QT Interval:  433 QTC Calculation: 471 R Axis:   -23  Text Interpretation: Sinus rhythm Short PR interval Right atrial enlargement Incomplete left bundle branch block Probable left ventricular hypertrophy Minimal ST elevation, inferior leads No significant change since last tracing Confirmed by Dean Clarity [phone]) on 01/02/2024 4:09:12 PM          Medications: I ordered medication including Rocephin , LR, hydralazine , Keppra , Depakote , Lamictal .  I have reviewed the patients home medicines and have made adjustments as needed.  Critical Interventions: None  Social Determinants of Health: None  Disposition: After consideration of the diagnostic results and the patients response to treatment, I feel that the patient would benefit from admission, patient care being transferred to Dr. Alfornia.  Final diagnoses:  Lethargy  Hydronephrosis, unspecified hydronephrosis type  Seizures [facility])    ED Discharge Orders     None          Beola Terrall GORMAN DEVONNA 01/02/24 2203    Dean Clarity, MD 01/03/24 [phone]

## 2024-01-02 NOTE — H&P (Signed)
 History and Physical    Alicia Brennan [name] DOB: 01-Oct-1950 DOA: 01/02/2024  PCP: [name], MD  Chief Complaint: Seizure  HPI: Alicia Brennan is a 73 y.o. female with medical history significant of pulmonary sarcoidosis, hypertension, seizure disorder, vascular dementia.  Admitted to the hospital last month 9/11-9/16 for acute metabolic encephalopathy and generalized weakness/frequent falls.  EEG done at that time was showing no evidence of seizures.  Neurology had recommended continuing Keppra  twice a day at a lower dose, Depakote , and Vimpat .  Her ammonia level was slightly elevated and she was placed on lactulose .  She had left lower extremity swelling which was felt to be due to lymphedema as previous echo in June 2025 showed preserved EF.  Lower extremity Doppler was negative for DVT.  Amlodipine  was discontinued.  Discharged to SNF.   Patient presents to the ED today due to concern for altered mental status, generalized weakness, and possible seizure witnessed by daughter earlier today.  Patient's facility reported recent abdominal pain associated with nausea and vomiting over the last few days but has not had any repeat episodes today.  Noted to be near baseline mental status per nursing facility but they noted some generalized weakness/malaise.  Patient reportedly diagnosed with COVID at her nursing facility 2 weeks ago and also possible UTI a week ago but not treated.  Vital signs on arrival to the ED: Temperature 98.7 F, pulse 71, respiratory rate 16, blood pressure 176/83, and SpO2 95% on room air.  Labs showing no leukocytosis, chloride 96, glucose 110, creatinine 1.1 (baseline 0.6-0.8), AST 58 and remainder of LFTs normal, ammonia level normal, lamotrigine /levetiracetam /valproic acid  level pending.  UA with large amount of leukocytes and microscopy showing >50 RBCs and >50 WBCs.  Urine culture in process.  Blood cultures in process.  CT abdomen pelvis showing left  renal hydronephrosis and hydroureter to the level of the bladder.  The distal ureter and bladder are not well-visualized due to streak artifact and changes may reflect obstruction due to a nonvisualized distal ureteral stone or reflux.  Chest x-ray showing no acute findings.  CT head and brain MRI negative for acute abnormality.  Patient was given IV hydralazine  2 mg, ceftriaxone , and 1 L IV fluids.  ED PA spoke to neurologist Dr. Vanessa who felt that breakthrough seizure is likely due to patient not being able to take her home seizure meds for the past few days due to nausea/vomiting.  Neurology recommended resuming current dose of her home p.o. seizure medications.  Case was also discussed with urology (Dr. Elisabeth) who recommended placing Foley catheter, keeping n.p.o. after midnight, and urology will consult and reevaluate with ultrasound tomorrow.  Foley catheter placed in the ED.  [facility] called to admit.   Patient is currently oriented to person and place.  She knows the year is 2025.  States she was brought into the hospital due to having seizure.  She is reporting nausea, vomiting, and generalized abdominal pain for the past few days and has not been able to take her home p.o. medications.  Also endorsing dysuria.  Denies diarrhea.  She reports having a bowel movement yesterday.  Denies cough, shortness breath, or chest pain.  No other complaints.  Review of Systems:  Review of Systems  All other systems reviewed and are negative.   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   Hypertension    IBS (irritable bowel syndrome)  IBS (irritable bowel syndrome)    Leg fracture, left    Recurrent headache    Sarcoidosis of lung    Seizures (HCC)     Past Surgical History:  Procedure Laterality Date   BILATERAL ANTERIOR TOTAL HIP ARTHROPLASTY Bilateral 11/15/2015   Procedure: BILATERAL ANTERIOR TOTAL HIP ARTHROPLASTY;  Surgeon: Redell Shoals, MD;  Location:  [facility] OR;  Service: Orthopedics;  Laterality: Bilateral;   CARPAL TUNNEL RELEASE Bilateral    CHOLECYSTECTOMY     COLONOSCOPY     EYE SURGERY     r/t sarcoidisis   ORIF ELBOW FRACTURE Left    TIBIA FRACTURE SURGERY Left      reports that she has never smoked. She has never used smokeless tobacco. She reports that she does not drink alcohol and does not use drugs.  No Known Allergies  Family History  Problem Relation Age of Onset   Dementia Mother    High blood pressure Father    Dementia Maternal Aunt    Sarcoidosis Son    Seizures Neg Hx     Prior to Admission medications   Medication Sig Start Date End Date Taking? Authorizing Provider  divalproex  (DEPAKOTE ) 250 MG DR tablet Take 1 tablet (250 mg total) by mouth 3 (three) times daily. Patient taking differently: Take 250 mg by mouth See admin instructions. Take 250 mg by mouth at 6 AM, 2 PM, and 10 PM 03/25/18  Yes Harris, Abigail, PA-C  furosemide  (LASIX ) 40 MG tablet Take 1 tablet (40 mg total) by mouth daily. 12/11/23  Yes Odell Celinda Balo, MD  hydrALAZINE  (APRESOLINE ) 25 MG tablet Take 1 tablet (25 mg total) by mouth every 8 (eight) hours. Patient taking differently: Take 25 mg by mouth See admin instructions. Take 25 mg by mouth every eight hours and hold for a Systolic reading less than 100, Diastolic reading less than 90, or a heart rate less than 60 12/11/23  Yes Odell Celinda Balo, MD  lacosamide  (VIMPAT ) 50 MG TABS tablet Take 1 tablet (50 mg total) by mouth 2 (two) times daily. 09/13/23  Yes Patsy Lenis, MD  lactulose  (CHRONULAC ) 10 GM/15ML solution Take 15 mLs (10 g total) by mouth daily. 12/11/23  Yes Odell Celinda Balo, MD  lamoTRIgine  (LAMICTAL ) 25 MG tablet Take 25 mg by mouth 2 (two) times daily. 11/19/23 01/30/24 Yes [provider]  levETIRAcetam  (KEPPRA ) 500 MG tablet Take 1 tablet (500 mg total) by mouth 2 (two) times daily. 12/11/23  Yes Odell Celinda Balo, MD  NAYZILAM  5 MG/0.1ML SOLN GIVE ONE  SPRAY INTO 1 NOSTRIL. IF NO RESPONSE IN 10 MINUTES, MAY GIVE SECOND SPRAY IN OTHER NOSTRIL. DO NOT GIVE SECOND DOSE IF PATIENT HAS TROUBLE BREATHING OR EXCESSIVE SEDATION, MAX 2 DOSES/SEIZURE EPISODE, 1 EPISODE EVERY 3 DAYS OR 5 EPISODES/MONTH Nasal for 6 Days 09/24/23  Yes [provider]  telmisartan (MICARDIS) 80 MG tablet Take 80 mg by mouth daily. 12/04/22  Yes [provider]    Physical Exam: Vitals:   01/02/24 1208 01/02/24 1500 01/02/24 1631 01/02/24 1917  BP:  (!) 192/74 (!) 164/86 (!) 161/80  Pulse:  70 70 74  Resp:  18 (!) 22 18  Temp:   98.6 F (37 C) (!) 96.8 F (36 C)  TempSrc:   Oral Oral  SpO2:  98% 99% 99%  Height: 5' 5 (1.651 m)       Physical Exam Vitals reviewed.  Constitutional:      General: She is not in acute distress.  HENT:     Head: Normocephalic and atraumatic.  Eyes:     Extraocular Movements: Extraocular movements intact.  Cardiovascular:     Rate and Rhythm: Normal rate and regular rhythm.     Heart sounds: Normal heart sounds.  Pulmonary:     Effort: Pulmonary effort is normal. No respiratory distress.     Breath sounds: Normal breath sounds.  Abdominal:     General: Bowel sounds are normal. There is no distension.     Palpations: Abdomen is soft.     Tenderness: There is no abdominal tenderness. There is no guarding.  Musculoskeletal:     Cervical back: Normal range of motion.     Right lower leg: No edema.     Left lower leg: No edema.  Skin:    General: Skin is warm and dry.  Neurological:     General: No focal deficit present.     Mental Status: She is alert.     Labs on Admission: I have personally reviewed following labs and imaging studies  CBC: Recent Labs  Lab 01/02/24 1318 01/02/24 1350  WBC 7.2  --   HGB 14.8 16.0*  HCT 47.1* 47.0*  MCV 91.8  --   PLT 157  --    Basic Metabolic Panel: Recent Labs  Lab 01/02/24 1318 01/02/24 1350  NA 135 136  K 3.9 4.0  CL 96* 97*  CO2 28  --   GLUCOSE 110*  116*  BUN 15 16  CREATININE 1.11* 1.40*  CALCIUM 9.9  --    GFR: CrCl cannot be calculated (Unknown ideal weight.). Liver Function Tests: Recent Labs  Lab 01/02/24 1318  AST 58*  ALT 31  ALKPHOS 113  BILITOT 1.1  PROT 8.1  ALBUMIN  3.9   No results for input(s): LIPASE, AMYLASE in the last 168 hours. Recent Labs  Lab 01/02/24 1319  AMMONIA 16   Coagulation Profile: No results for input(s): INR, PROTIME in the last 168 hours. Cardiac Enzymes: No results for input(s): CKTOTAL, CKMB, CKMBINDEX, TROPONINI in the last 168 hours. BNP (last 3 results) Recent Labs    12/06/23 1658  PROBNP 64.3   HbA1C: No results for input(s): HGBA1C in the last 72 hours. CBG: Recent Labs  Lab 01/02/24 1212  GLUCAP 107*   Lipid Profile: No results for input(s): CHOL, HDL, LDLCALC, TRIG, CHOLHDL, LDLDIRECT in the last 72 hours. Thyroid Function Tests: No results for input(s): TSH, T4TOTAL, FREET4, T3FREE, THYROIDAB in the last 72 hours. Anemia Panel: No results for input(s): VITAMINB12, FOLATE, FERRITIN, TIBC, IRON, RETICCTPCT in the last 72 hours. Urine analysis:    Component Value Date/Time   COLORURINE YELLOW 01/02/2024 1451   APPEARANCEUR TURBID (A) 01/02/2024 1451   APPEARANCEUR Clear 05/17/2013 1525   LABSPEC 1.010 01/02/2024 1451   LABSPEC 1.019 05/17/2013 1525   PHURINE 8.0 01/02/2024 1451   GLUCOSEU NEGATIVE 01/02/2024 1451   GLUCOSEU Negative 05/17/2013 1525   HGBUR LARGE (A) 01/02/2024 1451   BILIRUBINUR NEGATIVE 01/02/2024 1451   BILIRUBINUR Negative 05/17/2013 1525   KETONESUR NEGATIVE 01/02/2024 1451   PROTEINUR 100 (A) 01/02/2024 1451   NITRITE NEGATIVE 01/02/2024 1451   LEUKOCYTESUR LARGE (A) 01/02/2024 1451   LEUKOCYTESUR Trace 05/17/2013 1525    Radiological Exams on Admission: MR BRAIN WO CONTRAST Result Date: 01/02/2024 EXAM: MR Brain without Intravenous Contrast. CLINICAL HISTORY: Neuro deficit,  acute, stroke suspected. Neuro deficit; pt has had weakness over the last week. TECHNIQUE: Magnetic resonance images of the brain without intravenous  contrast in multiple planes. CONTRAST: Without. COMPARISON: CT head from earlier today. MRI head 09/12/2023. FINDINGS: BRAIN: No restricted diffusion to indicate acute infarction. No intracranial mass or hemorrhage. No midline shift or extra-axial fluid collection. The central arterial and venous flow voids are patent. Cerebral atrophy. Mild for age T2 FLAIR hyperintensities of the white matter, compatible with chronic microvascular ischemic changes. VENTRICLES: No hydrocephalus. ORBITS: The orbits are normal. SINUSES AND MASTOIDS: The sinuses and mastoid air cells are clear. BONES: No acute fracture or focal osseous lesion. IMPRESSION: 1. No acute abnormality. Electronically signed by: Gilmore Molt MD 01/02/2024 07:00 PM EDT RP Workstation: [id number]   CT Head Wo Contrast Result Date: 01/02/2024 CLINICAL DATA:  Weakness and fatigue. EXAM: CT HEAD WITHOUT CONTRAST TECHNIQUE: Contiguous axial images were obtained from the base of the skull through the vertex without intravenous contrast. RADIATION DOSE REDUCTION: This exam was performed according to the departmental dose-optimization program which includes automated exposure control, adjustment of the mA and/or kV according to patient size and/or use of iterative reconstruction technique. COMPARISON:  December 06, 2023 FINDINGS: Brain: There is generalized cerebral atrophy with widening of the extra-axial spaces and ventricular dilatation. There are areas of decreased attenuation within the white matter tracts of the supratentorial brain, consistent with microvascular disease changes. Vascular: No hyperdense vessel or unexpected calcification. Skull: Normal. Negative for fracture or focal lesion. Sinuses/Orbits: No acute finding. Other: None. IMPRESSION: 1. Generalized cerebral atrophy and microvascular disease  changes of the supratentorial brain. 2. No acute intracranial abnormality. Electronically Signed   By: Suzen Dials M.D.   On: 01/02/2024 16:17   CT ABDOMEN PELVIS W CONTRAST Result Date: 01/02/2024 CLINICAL DATA:  Acute nonlocalized abdominal pain. Increasing weakness over the past week. EXAM: CT ABDOMEN AND PELVIS WITH CONTRAST TECHNIQUE: Multidetector CT imaging of the abdomen and pelvis was performed using the standard protocol following bolus administration of intravenous contrast. RADIATION DOSE REDUCTION: This exam was performed according to the departmental dose-optimization program which includes automated exposure control, adjustment of the mA and/or kV according to patient size and/or use of iterative reconstruction technique. CONTRAST:  100mL OMNIPAQUE IOHEXOL 300 MG/ML  SOLN COMPARISON:  05/05/2011 FINDINGS: Lower chest: Infiltration or atelectasis in the lung bases. Small esophageal hiatal hernia. Hepatobiliary: No focal liver lesions. Gallbladder is not visualized, possibly contracted or surgically absent. No bile duct dilatation. Pancreas: Unremarkable. No pancreatic ductal dilatation or surrounding inflammatory changes. Spleen: Normal in size without focal abnormality. Adrenals/Urinary Tract: No adrenal gland nodules. Kidneys are symmetrical. Moderate left hydronephrosis and hydroureter. Hydroureter extends down to the level of the bladder. Unfortunately due to streak artifact from hip arthroplasties, the distal ureter and bladder are not well visualized. Changes could represent obstruction due to a nonvisualized distal ureteral stone or this could be due to reflux disease. The bladder is decompressed with a Foley catheter. Stomach/Bowel: Stomach, small bowel, and colon are not abnormally distended. No wall thickening or inflammatory stranding. Appendix is not identified. Vascular/Lymphatic: Aortic atherosclerosis. No enlarged abdominal or pelvic lymph nodes. Reproductive: Uterus and  bilateral adnexa are unremarkable. Other: Small amount of free fluid in the pelvis is nonspecific, probably reactive. Broad-based ventral abdominal wall hernia at the umbilicus containing fat and transverse colon without proximal obstruction. Musculoskeletal: Degenerative changes in the spine. Mild anterior subluxation of L4 on L5 is likely degenerative. Bilateral total hip arthroplasties. IMPRESSION: 1. Left renal hydronephrosis and hydroureter to the level of the bladder. Distal ureter and bladder are not well visualized due to streak artifact.  Changes may indicate obstruction due to a nonvisualized distal ureteral stone or reflux. 2. No evidence of bowel obstruction or inflammation. 3. Mild aortic atherosclerosis. 4. Infiltration or atelectasis seen in both lung bases. 5. Small esophageal hiatal hernia. Broad-based ventral abdominal wall hernia containing colon but without proximal obstruction. Electronically Signed   By: Elsie Gravely M.D.   On: 01/02/2024 16:09   DG Chest 2 View Result Date: 01/02/2024 CLINICAL DATA:  Shortness of breath.  AMS. EXAM: CHEST - 2 VIEW COMPARISON:  12/06/2023. FINDINGS: The heart size and mediastinal contours are unchanged. Aortic atherosclerosis. No overt edema, focal consolidation, pleural effusion, or pneumothorax. No acute osseous abnormality. IMPRESSION: 1. No acute cardiopulmonary findings. 2.  Aortic Atherosclerosis (ICD10-I70.0). Electronically Signed   By: Harrietta Sherry M.D.   On: 01/02/2024 13:23    EKG: Independently reviewed. Sinus rhythm, new T wave abnormalities in anterior leads compared to previous EKG from September 2025.  Assessment and Plan  Breakthrough seizure In the setting of nausea/vomiting and patient not being able to take her home p.o. meds for the past few days.  No longer actively vomiting now and neurology recommended resuming current dose of her home p.o. antiepileptics.  Continue divalproex , lacosamide , lamotrigine , and  levetiracetam .  Checking drug serum levels.  EEG ordered.  Seizure precautions.  Complicated UTI Left renal hydronephrosis and hydroureter Patient is having generalized abdominal pain, nausea, and vomiting for the past few days.  No longer actively vomiting now.  No fever or leukocytosis. CT abdomen pelvis showing left renal hydronephrosis and hydroureter to the level of the bladder.  The distal ureter and bladder are not well-visualized due to streak artifact and changes may reflect obstruction due to a nonvisualized distal ureteral stone or reflux.  UA with evidence of pyuria.  Urology will consult and reevaluate with ultrasound tomorrow.  Keep n.p.o. after midnight and continue Foley catheter.  Continue ceftriaxone .  Follow-up urine and blood cultures.  AKI Likely multifactorial in etiology -prerenal from dehydration plus postrenal/obstructive given CT findings.  Creatinine 1.1 (baseline 0.6-0.8).  Patient was given IV fluids in the ED.  Avoid nephrotoxic agents/hold home Lasix  and telmisartan.  Continue to monitor renal function.   Acute encephalopathy Likely due to breakthrough seizure.  Does have known history of vascular dementia.  Ammonia level normal. CT head and brain MRI negative for acute abnormality.  Patient is currently oriented to person and place.  She knows the year is 2025 and is able to answer questions.  Acute EKG changes EKG done in the ED today showing new T wave abnormalities in anterior leads compared to previous EKG from September 2025.  ACS less likely as patient is not endorsing chest pain.  Check stat troponin x 2.  Hypertensive urgency SBP as high as 190s in the ED and was given IV hydralazine .  Most recent SBP in the 160s.  Amlodipine  was stopped during hospitalization last month due to concern for lower extremity edema.  Holding home Lasix  and telmisartan at present due to concern for AKI.  Continue p.o. hydralazine  and IV hydralazine  PRN SBP >160.  DVT prophylaxis:  SCDs Code Status: Full code (discussed with the patient). No family available at this time. Per review of chart, she was listed as full code during previous hospitalizations. Level of care: Progressive Care Unit Admission status: It is my clinical opinion that admission to INPATIENT is reasonable and necessary because of the expectation that this patient will require hospital care that crosses at least 2 midnights to  treat this condition based on the medical complexity of the problems presented.  Given the aforementioned information, the predictability of an adverse outcome is felt to be significant.  Editha Ram MD [facility]  If 7PM-7AM, please contact night-coverage [email or web address]  01/02/2024, 10:02 PM

## 2024-01-02 NOTE — ED Triage Notes (Signed)
 Pt BIB [facility] from [facility] care. Pt has been getting weaker over the past week. Daughter reports that she has been trying to get her sent out to the hospital x 1 week. Pt reports just feeling tired.

## 2024-01-02 NOTE — ED Notes (Signed)
Unable to get 2nd set of cultures 

## 2024-01-03 ENCOUNTER — Inpatient Hospital Stay (HOSPITAL_COMMUNITY): Admitting: Anesthesiology

## 2024-01-03 ENCOUNTER — Inpatient Hospital Stay (HOSPITAL_COMMUNITY)

## 2024-01-03 ENCOUNTER — Inpatient Hospital Stay (HOSPITAL_COMMUNITY)
Admit: 2024-01-03 | Discharge: 2024-01-03 | Disposition: A | Discharge status: Home / Self Care | Attending: Internal Medicine | Admitting: Internal Medicine

## 2024-01-03 ENCOUNTER — Encounter (HOSPITAL_COMMUNITY): Payer: Self-pay | Admitting: Internal Medicine

## 2024-01-03 ENCOUNTER — Encounter (HOSPITAL_COMMUNITY)
Admission: EM | Disposition: A | Discharge status: Skilled Nursing Facility | Payer: Self-pay | Source: Skilled Nursing Facility | Attending: Family Medicine

## 2024-01-03 DIAGNOSIS — N132 Hydronephrosis with renal and ureteral calculous obstruction: Secondary | ICD-10-CM

## 2024-01-03 DIAGNOSIS — I10 Essential (primary) hypertension: Secondary | ICD-10-CM | POA: Diagnosis not present

## 2024-01-03 DIAGNOSIS — R569 Unspecified convulsions: Secondary | ICD-10-CM

## 2024-01-03 DIAGNOSIS — G40919 Epilepsy, unspecified, intractable, without status epilepticus: Secondary | ICD-10-CM | POA: Diagnosis not present

## 2024-01-03 HISTORY — PX: CYSTOSCOPY W/ URETERAL STENT PLACEMENT: SHX1429

## 2024-01-03 LAB — GLUCOSE, CAPILLARY: Glucose-Capillary: 78 mg/dL (ref 70–99)

## 2024-01-03 LAB — COMPREHENSIVE METABOLIC PANEL WITH GFR
ALT: 26 U/L (ref 0–44)
AST: 44 U/L — ABNORMAL HIGH (ref 15–41)
Albumin: 3.4 g/dL — ABNORMAL LOW (ref 3.5–5.0)
Alkaline Phosphatase: 102 U/L (ref 38–126)
Anion gap: 14 (ref 5–15)
BUN: 16 mg/dL (ref 8–23)
CO2: 21 mmol/L — ABNORMAL LOW (ref 22–32)
Calcium: 9.5 mg/dL (ref 8.9–10.3)
Chloride: 100 mmol/L (ref 98–111)
Creatinine, Ser: 1.17 mg/dL — ABNORMAL HIGH (ref 0.44–1.00)
GFR, Estimated: 49 mL/min — ABNORMAL LOW (ref >60)
Glucose, Bld: 91 mg/dL (ref 70–99)
Potassium: 3.4 mmol/L — ABNORMAL LOW (ref 3.5–5.1)
Sodium: 136 mmol/L (ref 135–145)
Total Bilirubin: 0.9 mg/dL (ref 0.0–1.2)
Total Protein: 7.4 g/dL (ref 6.5–8.1)

## 2024-01-03 LAB — CBC
HCT: 47 % — ABNORMAL HIGH (ref 36.0–46.0)
Hemoglobin: 14.8 g/dL (ref 12.0–15.0)
MCH: 29.2 pg (ref 26.0–34.0)
MCHC: 31.5 g/dL (ref 30.0–36.0)
MCV: 92.7 fL (ref 80.0–100.0)
Platelets: 145 K/uL — ABNORMAL LOW (ref 150–400)
RBC: 5.07 MIL/uL (ref 3.87–5.11)
RDW: 13.7 % (ref 11.5–15.5)
WBC: 6.2 K/uL (ref 4.0–10.5)
nRBC: 0 % (ref 0.0–0.2)

## 2024-01-03 LAB — LAMOTRIGINE LEVEL: Lamotrigine Lvl: 6.1 ug/mL (ref 2.0–20.0)

## 2024-01-03 LAB — TROPONIN T, HIGH SENSITIVITY
Troponin T High Sensitivity: 47 ng/L — ABNORMAL HIGH (ref 0–19)
Troponin T High Sensitivity: 31 ng/L — ABNORMAL HIGH (ref 0–19)

## 2024-01-03 LAB — VALPROIC ACID LEVEL: Valproic Acid Lvl: 57 ug/mL (ref 50–100)

## 2024-01-03 SURGERY — CYSTOSCOPY, WITH RETROGRADE PYELOGRAM AND URETERAL STENT INSERTION
Anesthesia: General | Laterality: Left

## 2024-01-03 MED ORDER — VALPROATE SODIUM 100 MG/ML IV SOLN
250.0000 mg | Freq: Three times a day (TID) | INTRAVENOUS | Status: AC
  Administered 2024-01-03 – 2024-01-05 (×5): 250 mg via INTRAVENOUS
  Filled 2024-01-03 (×5): qty 2.5

## 2024-01-03 MED ORDER — FENTANYL CITRATE (PF) 100 MCG/2ML IJ SOLN
INTRAMUSCULAR | Status: AC
  Filled 2024-01-03: qty 2

## 2024-01-03 MED ORDER — LACTATED RINGERS IV SOLN: INTRAVENOUS | Status: DC | PRN

## 2024-01-03 MED ORDER — SUCCINYLCHOLINE CHLORIDE 200 MG/10ML IV SOSY
PREFILLED_SYRINGE | INTRAVENOUS | Status: AC
  Filled 2024-01-03: qty 10

## 2024-01-03 MED ORDER — PROPOFOL 10 MG/ML IV BOLUS
INTRAVENOUS | Status: DC | PRN
  Administered 2024-01-03: 150 mg via INTRAVENOUS

## 2024-01-03 MED ORDER — ONDANSETRON HCL 4 MG/2ML IJ SOLN
INTRAMUSCULAR | Status: DC | PRN
  Administered 2024-01-03: 4 mg via INTRAVENOUS

## 2024-01-03 MED ORDER — 0.9 % SODIUM CHLORIDE (POUR BTL) OPTIME
TOPICAL | Status: DC | PRN
Start: 2024-01-03 — End: 2024-01-03
  Administered 2024-01-03: 1000 mL

## 2024-01-03 MED ORDER — PHENYLEPHRINE 80 MCG/ML (10ML) SYRINGE FOR IV PUSH (FOR BLOOD PRESSURE SUPPORT)
PREFILLED_SYRINGE | INTRAVENOUS | Status: AC
  Filled 2024-01-03: qty 10

## 2024-01-03 MED ORDER — DEXAMETHASONE SODIUM PHOSPHATE 4 MG/ML IJ SOLN
INTRAMUSCULAR | Status: DC | PRN
  Administered 2024-01-03: 5 mg via INTRAVENOUS

## 2024-01-03 MED ORDER — FENTANYL CITRATE (PF) 100 MCG/2ML IJ SOLN
INTRAMUSCULAR | Status: DC | PRN
  Administered 2024-01-03: 50 ug via INTRAVENOUS
  Administered 2024-01-03 (×2): 25 ug via INTRAVENOUS

## 2024-01-03 MED ORDER — LIDOCAINE HCL (PF) 2 % IJ SOLN
INTRAMUSCULAR | Status: AC
  Filled 2024-01-03: qty 5

## 2024-01-03 MED ORDER — PROPOFOL 10 MG/ML IV BOLUS
INTRAVENOUS | Status: AC
  Filled 2024-01-03: qty 20

## 2024-01-03 MED ORDER — FENTANYL CITRATE (PF) 50 MCG/ML IJ SOSY: 25.0000 ug | PREFILLED_SYRINGE | INTRAMUSCULAR | Status: DC | PRN

## 2024-01-03 MED ORDER — LEVETIRACETAM (KEPPRA) 500 MG/5 ML ADULT IV PUSH
500.0000 mg | Freq: Two times a day (BID) | INTRAVENOUS | Status: DC
  Administered 2024-01-03 – 2024-01-04 (×2): 500 mg via INTRAVENOUS
  Filled 2024-01-03 (×2): qty 5

## 2024-01-03 MED ORDER — CHLORHEXIDINE GLUCONATE CLOTH 2 % EX PADS
6.0000 | MEDICATED_PAD | Freq: Every day | CUTANEOUS | Status: DC
  Administered 2024-01-03 – 2024-01-10 (×8): 6 via TOPICAL

## 2024-01-03 MED ORDER — EPHEDRINE 5 MG/ML INJ
INTRAVENOUS | Status: AC
  Filled 2024-01-03: qty 5

## 2024-01-03 MED ORDER — OXYCODONE HCL 5 MG/5ML PO SOLN: 5.0000 mg | Freq: Once | ORAL | Status: DC | PRN

## 2024-01-03 MED ORDER — DROPERIDOL 2.5 MG/ML IJ SOLN: 0.6250 mg | Freq: Once | INTRAMUSCULAR | Status: DC | PRN

## 2024-01-03 MED ORDER — IOHEXOL 300 MG/ML  SOLN
INTRAMUSCULAR | Status: DC | PRN
  Administered 2024-01-03: 10 mL

## 2024-01-03 MED ORDER — SODIUM CHLORIDE 0.9 % IV SOLN
50.0000 mg | Freq: Two times a day (BID) | INTRAVENOUS | Status: DC
  Administered 2024-01-04 (×2): 50 mg via INTRAVENOUS
  Filled 2024-01-03 (×2): qty 5

## 2024-01-03 MED ORDER — LIDOCAINE HCL (CARDIAC) PF 100 MG/5ML IV SOSY
PREFILLED_SYRINGE | INTRAVENOUS | Status: DC | PRN
  Administered 2024-01-03: 100 mg via INTRAVENOUS

## 2024-01-03 MED ORDER — ACETAMINOPHEN 10 MG/ML IV SOLN: 1000.0000 mg | Freq: Once | INTRAVENOUS | Status: DC | PRN

## 2024-01-03 MED ORDER — EPHEDRINE SULFATE (PRESSORS) 50 MG/ML IJ SOLN
INTRAMUSCULAR | Status: DC | PRN
  Administered 2024-01-03 (×2): 10 mg via INTRAVENOUS

## 2024-01-03 MED ORDER — OXYCODONE HCL 5 MG PO TABS: 5.0000 mg | ORAL_TABLET | Freq: Once | ORAL | Status: DC | PRN

## 2024-01-03 MED ORDER — PHENYLEPHRINE HCL (PRESSORS) 10 MG/ML IV SOLN
INTRAVENOUS | Status: DC | PRN
  Administered 2024-01-03 (×2): 80 ug via INTRAVENOUS

## 2024-01-03 MED ORDER — SODIUM CHLORIDE 0.9 % IR SOLN
Status: DC | PRN
  Administered 2024-01-03: 3000 mL via INTRAVESICAL

## 2024-01-03 MED ORDER — POTASSIUM CHLORIDE IN NACL 40-0.9 MEQ/L-% IV SOLN
INTRAVENOUS | Status: DC
  Filled 2024-01-03 (×5): qty 1000

## 2024-01-03 MED ORDER — SUCCINYLCHOLINE CHLORIDE 200 MG/10ML IV SOSY
PREFILLED_SYRINGE | INTRAVENOUS | Status: DC | PRN
  Administered 2024-01-03: 100 mg via INTRAVENOUS

## 2024-01-03 MED ORDER — ONDANSETRON HCL 4 MG/2ML IJ SOLN
INTRAMUSCULAR | Status: AC
Start: 2024-01-03 — End: 2024-01-03
  Filled 2024-01-03: qty 2

## 2024-01-03 MED ORDER — SODIUM CHLORIDE 0.9 % IV SOLN
INTRAVENOUS | Status: DC
Start: 2024-01-03 — End: 2024-01-03

## 2024-01-03 SURGICAL SUPPLY — 12 items
BAG URO CATCHER STRL LF (MISCELLANEOUS) ×1 IMPLANT
CATH SILICON 18FR 30CC (CATHETERS) IMPLANT
CATH URETL OPEN 5X70 (CATHETERS) ×1 IMPLANT
CLOTH BEACON ORANGE TIMEOUT ST (SAFETY) ×1 IMPLANT
GLOVE SURG LX STRL 8.0 MICRO (GLOVE) ×1 IMPLANT
GOWN STRL SURGICAL XL XLNG (GOWN DISPOSABLE) ×1 IMPLANT
GUIDEWIRE ZIPWRE .038 STRAIGHT (WIRE) ×1 IMPLANT
KIT TURNOVER KIT A (KITS) ×1 IMPLANT
MANIFOLD NEPTUNE II (INSTRUMENTS) ×1 IMPLANT
PACK CYSTO (CUSTOM PROCEDURE TRAY) ×1 IMPLANT
STENT URET 6FRX24 CONTOUR (STENTS) IMPLANT
TUBING CONNECTING 10 (TUBING) ×1 IMPLANT

## 2024-01-03 NOTE — Op Note (Signed)
 Operative Note  Preoperative diagnosis:  1.  Left hydronephrosis with possible left distal ureteral stone 2.  Acute cystitis  Postoperative diagnosis: Same  Procedure(s): 1.  Cystoscopy with left ureteral stent placement 2.  Left retrograde pyelogram with intraoperative interpretation fluoroscopic imaging  Surgeon: Lonni Han, MD  Assistants:  None  Anesthesia:  General  Complications:  None  EBL: None  Specimens: 1.  None  Drains/Catheters: 1.  Left 6 French, 24 cm JJ stent without tether 2.  18 French silicone Foley catheter with 10 mL sterile water in the balloon  Intraoperative findings:   Diffusely erythematous bladder mucosa with no identifiable papillary lesion Solitary left collecting system with no filling defects or dilation involving the left ureter or left renal pelvis seen on retrograde pyelogram  Indication:  Alicia Brennan is a 73 y.o. female with with a history of vascular dementia who presented to the emergency department with AMS, intermittent nausea/vomiting and possible seizure.  She had a CT abdomen/pelvis that revealed left-sided hydronephrosis with no identifiable luminal stone burden within the left ureter, but the distal aspects of the left ureter obscured by her hip prostheses.  UA is concerning for acute cystitis.  Due to her constellation of symptoms, we decided to proceed with preemptive left ureteral stent placement to rule out an obstructing calculus as the source of her clinical condition.  The patient and her daughter have been consented for the above procedures, voiced understanding and wished to proceed.  Description of procedure:  After informed consent was obtained, the patient was brought to the operating room and general LMA anesthesia was administered. The patient was then placed in the dorsolithotomy position and prepped and draped in the usual sterile fashion. A timeout was performed. A 21 French rigid cystoscope was then  inserted into the urethral meatus and advanced into the bladder under direct vision. A complete bladder survey revealed diffusely erythematous bladder mucosa with no papillary lesion or ulceration.  A 5 French ureteral catheter was then inserted into the left ureteral orifice and a retrograde pyelogram was obtained, with the findings listed above.  A Glidewire was then used to intubate the lumen of the ureteral catheter and was advanced up to the left renal pelvis, under fluoroscopic guidance.  The catheter was then removed, leaving the wire in place.  A 6 French, 24 cm JJ stent was then placed over the Glidewire and into good position within the left collecting system, confirming placement via fluoroscopy.  An 50 French silicone catheter was then inserted into the bladder and placed to gravity drainage.  The patient tolerated the procedure well was transferred to the postanesthesia in stable condition.  Plan: The patient is catheter dependent so Foley catheter removal will be per the primary team.  Continue empiric Rocephin  for antibiotic coverage.  Plan for follow-up in 2 weeks to reassess her urinalysis and plan for a ureteroscopy to rule out a distal ureteral stone as the source of her left-sided hydronephrosis.

## 2024-01-03 NOTE — Anesthesia Procedure Notes (Signed)
 Procedure Name: Intubation Date/Time: 01/03/2024 12:19 PM  Performed by: Pam Macario BROCKS, CRNAPre-anesthesia Checklist: Patient identified, Emergency Drugs available, Suction available, Patient being monitored and Timeout performed Patient Re-evaluated:Patient Re-evaluated prior to induction Oxygen Delivery Method: Circle system utilized Preoxygenation: Pre-oxygenation with 100% oxygen Induction Type: IV induction Ventilation: Mask ventilation without difficulty Laryngoscope Size: Mac and 3 Grade View: Grade II Tube type: Oral Tube size: 7.0 mm Number of attempts: 1 Airway Equipment and Method: Stylet and Oral airway Placement Confirmation: ETT inserted through vocal cords under direct vision, positive ETCO2, breath sounds checked- equal and bilateral and CO2 detector Secured at: 22 cm Tube secured with: Tape Dental Injury: Teeth and Oropharynx as per pre-operative assessment  Comments: Poor oral care preop

## 2024-01-03 NOTE — Progress Notes (Signed)
 PROGRESS NOTE    Alicia Brennan  [name] DOB: May 11, 1950 DOA: 01/02/2024 PCP: [name], MD   Brief Narrative:  HPI: Alicia Brennan is a 73 y.o. female with medical history significant of pulmonary sarcoidosis, hypertension, seizure disorder, vascular dementia.  Admitted to the hospital last month 9/11-9/16 for acute metabolic encephalopathy and generalized weakness/frequent falls.  EEG done at that time was showing no evidence of seizures.  Neurology had recommended continuing Keppra  twice a day at a lower dose, Depakote , and Vimpat .  Her ammonia level was slightly elevated and she was placed on lactulose .  She had left lower extremity swelling which was felt to be due to lymphedema as previous echo in June 2025 showed preserved EF.  Lower extremity Doppler was negative for DVT.  Amlodipine  was discontinued.  Discharged to SNF.   Patient presented to the ED today due to concern for altered mental status, generalized weakness, and possible seizure witnessed by daughter earlier on the day of presentation.  Patient's facility reported recent abdominal pain associated with nausea and vomiting over the last few days but has not had any repeat episodes today.  Noted to be near baseline mental status per nursing facility but they noted some generalized weakness/malaise.  Patient reportedly diagnosed with COVID at her nursing facility 2 weeks ago and also possible UTI a week ago but not treated.  He was hemodynamically stable in the ED except significantly elevated systolic blood pressure.  UA was thought to be consistent with UTI,  CT abdomen pelvis showing left renal hydronephrosis and hydroureter to the level of the bladder.  The distal ureter and bladder are not well-visualized due to streak artifact and changes may reflect obstruction due to a nonvisualized distal ureteral stone or reflux.  Chest x-ray showing no acute findings.  CT head and brain MRI negative for acute abnormality.  ED  PA spoke to neurologist Dr. Vanessa who felt that breakthrough seizure is likely due to patient not being able to take her home seizure meds for the past few days due to nausea/vomiting.  Neurology recommended resuming current dose of her home p.o. seizure medications.  Case was also discussed with urology (Dr. Elisabeth) who recommended placing Foley catheter, keeping n.p.o. after midnight, and urology will consult and reevaluate with ultrasound tomorrow.    Assessment & Plan:   Principal Problem:   Breakthrough seizure (HCC) Active Problems:   Acute encephalopathy   Complicated UTI (urinary tract infection)   Hydronephrosis   AKI (acute kidney injury)  Breakthrough seizure In the setting of nausea/vomiting and patient not being able to take her home p.o. meds for the past few days. neurology recommended resuming current dose of her home p.o. antiepileptics.  Continue divalproex , lacosamide , lamotrigine , and levetiracetam .  Checking drug serum levels which are pending.  EEG ordered.  Seizure precautions.   Complicated UTI / Left renal hydronephrosis and hydroureter  CT abdomen pelvis showing left renal hydronephrosis and hydroureter to the level of the bladder.  The distal ureter and bladder are not well-visualized due to streak artifact and changes may reflect obstruction due to a nonvisualized distal ureteral stone or reflux.  Continue current antibiotics, follow culture.  Urology to evaluate.   AKI Likely multifactorial in etiology -prerenal from dehydration plus postrenal/obstructive given CT findings.  Creatinine 1.1 which peaked at 1.4 but now down to 1.17.  (baseline 0.6-0.8).  Patient was given IV fluids in the ED.  Avoid nephrotoxic agents/hold home Lasix  and telmisartan.  Continue to monitor renal  function.  Resume IV fluids.  Hypokalemia: Will replenish with IV fluids.   Acute encephalopathy CT head and brain MRI negative for acute abnormality.  Patient is currently oriented to  person and place.    Acute EKG changes EKG done in the ED today showing new T wave abnormalities in anterior leads compared to previous EKG from September 2025.  ACS less likely as patient is not endorsing chest pain.  Troponin 47.  Repeat troponin pending.   Hypertensive urgency SBP as high as 190s in the ED and was given IV hydralazine .  Amlodipine  was stopped during hospitalization last month due to concern for lower extremity edema.  Holding home Lasix  and telmisartan at present due to concern for AKI.  Continue p.o. hydralazine  and IV hydralazine  PRN SBP >160.  Blood pressure improved.  DVT prophylaxis: SCDs Start: 01/02/24 2214   Code Status: Full Code  Family Communication:  None present at bedside.   Status is: Inpatient Remains inpatient appropriate because: To be seen by urology   Estimated body mass index is 30.71 kg/m as calculated from the following:   Height as of this encounter: 5' 5 (1.651 m).   Weight as of 12/06/23: 83.7 kg.    Nutritional Assessment: Body mass index is 30.71 kg/m.SABRA Seen by dietician.  I agree with the assessment and plan as outlined below: Nutrition Status:        . Skin Assessment: I have examined the patient's skin and I agree with the wound assessment as performed by the wound care RN as outlined below:    Consultants:  Urology Neurology curbside  Procedures:  None  Antimicrobials:  Anti-infectives (From admission, onward)    Start     Dose/Rate Route Frequency Ordered Stop   01/03/24 1600  cefTRIAXone  (ROCEPHIN ) 1 g in sodium chloride  0.9 % 100 mL IVPB        1 g 200 mL/hr over 30 Minutes Intravenous Every 24 hours 01/02/24 2215     01/02/24 1615  cefTRIAXone  (ROCEPHIN ) 1 g in sodium chloride  0.9 % 100 mL IVPB        1 g 200 mL/hr over 30 Minutes Intravenous  Once 01/02/24 1610 01/02/24 1756         Subjective: Patient seen and examined, still in the ED.  RN at the bedside.  Patient lethargic.  However able to hold  conversation.  She was able to tell me her name, date of birth, the fact that she is in the hospital but she could not tell me the month, the year.  She denied having any complaints.  She was able to tell me her Entresto.  Objective: Vitals:   01/03/24 0400 01/03/24 0522 01/03/24 0700 01/03/24 0732  BP:   (!) 114/56 105/63  Pulse:    68  Resp: 16   16  Temp:  98.1 F (36.7 C)  98.1 F (36.7 C)  TempSrc:  Oral  Oral  SpO2:    96%  Height:        Intake/Output Summary (Last 24 hours) at 01/03/2024 0846 Last data filed at 01/03/2024 0700 Gross per 24 hour  Intake 1000 ml  Output 1200 ml  Net -200 ml   There were no vitals filed for this visit.  Examination:  General exam: Appears calm and comfortable but lethargic Respiratory system: Clear to auscultation. Respiratory effort normal. Cardiovascular system: S1 & S2 heard, RRR. No JVD, murmurs, rubs, gallops or clicks. No pedal edema. Gastrointestinal system: Abdomen is nondistended, soft  and nontender. No organomegaly or masses felt. Normal bowel sounds heard. Central nervous system: Lethargic but oriented x 2. No focal neurological deficits. Extremities: Symmetric 5 x 5 power.   Data Reviewed: I have personally reviewed following labs and imaging studies  CBC: Recent Labs  Lab 01/02/24 1318 01/02/24 1350 01/03/24 0522  WBC 7.2  --  6.2  HGB 14.8 16.0* 14.8  HCT 47.1* 47.0* 47.0*  MCV 91.8  --  92.7  PLT 157  --  145*   Basic Metabolic Panel: Recent Labs  Lab 01/02/24 1318 01/02/24 1350 01/03/24 0522  NA 135 136 136  K 3.9 4.0 3.4*  CL 96* 97* 100  CO2 28  --  21*  GLUCOSE 110* 116* 91  BUN 15 16 16   CREATININE 1.11* 1.40* 1.17*  CALCIUM 9.9  --  9.5   GFR: CrCl cannot be calculated (Unknown ideal weight.). Liver Function Tests: Recent Labs  Lab 01/02/24 1318 01/03/24 0522  AST 58* 44*  ALT 31 26  ALKPHOS 113 102  BILITOT 1.1 0.9  PROT 8.1 7.4  ALBUMIN  3.9 3.4*   No results for input(s):  LIPASE, AMYLASE in the last 168 hours. Recent Labs  Lab 01/02/24 1319  AMMONIA 16   Coagulation Profile: No results for input(s): INR, PROTIME in the last 168 hours. Cardiac Enzymes: No results for input(s): CKTOTAL, CKMB, CKMBINDEX, TROPONINI in the last 168 hours. BNP (last 3 results) Recent Labs    12/06/23 1658  PROBNP 64.3   HbA1C: No results for input(s): HGBA1C in the last 72 hours. CBG: Recent Labs  Lab 01/02/24 1212  GLUCAP 107*   Lipid Profile: No results for input(s): CHOL, HDL, LDLCALC, TRIG, CHOLHDL, LDLDIRECT in the last 72 hours. Thyroid Function Tests: No results for input(s): TSH, T4TOTAL, FREET4, T3FREE, THYROIDAB in the last 72 hours. Anemia Panel: No results for input(s): VITAMINB12, FOLATE, FERRITIN, TIBC, IRON, RETICCTPCT in the last 72 hours. Sepsis Labs: No results for input(s): PROCALCITON, LATICACIDVEN in the last 168 hours.  No results found for this or any previous visit (from the past 240 hours).   Radiology Studies: MR BRAIN WO CONTRAST Result Date: 01/02/2024 EXAM: MR Brain without Intravenous Contrast. CLINICAL HISTORY: Neuro deficit, acute, stroke suspected. Neuro deficit; pt has had weakness over the last week. TECHNIQUE: Magnetic resonance images of the brain without intravenous contrast in multiple planes. CONTRAST: Without. COMPARISON: CT head from earlier today. MRI head 09/12/2023. FINDINGS: BRAIN: No restricted diffusion to indicate acute infarction. No intracranial mass or hemorrhage. No midline shift or extra-axial fluid collection. The central arterial and venous flow voids are patent. Cerebral atrophy. Mild for age T2 FLAIR hyperintensities of the white matter, compatible with chronic microvascular ischemic changes. VENTRICLES: No hydrocephalus. ORBITS: The orbits are normal. SINUSES AND MASTOIDS: The sinuses and mastoid air cells are clear. BONES: No acute fracture or focal  osseous lesion. IMPRESSION: 1. No acute abnormality. Electronically signed by: Gilmore Molt MD 01/02/2024 07:00 PM EDT RP Workstation: [id number]   CT Head Wo Contrast Result Date: 01/02/2024 CLINICAL DATA:  Weakness and fatigue. EXAM: CT HEAD WITHOUT CONTRAST TECHNIQUE: Contiguous axial images were obtained from the base of the skull through the vertex without intravenous contrast. RADIATION DOSE REDUCTION: This exam was performed according to the departmental dose-optimization program which includes automated exposure control, adjustment of the mA and/or kV according to patient size and/or use of iterative reconstruction technique. COMPARISON:  December 06, 2023 FINDINGS: Brain: There is generalized cerebral atrophy with widening of the  extra-axial spaces and ventricular dilatation. There are areas of decreased attenuation within the white matter tracts of the supratentorial brain, consistent with microvascular disease changes. Vascular: No hyperdense vessel or unexpected calcification. Skull: Normal. Negative for fracture or focal lesion. Sinuses/Orbits: No acute finding. Other: None. IMPRESSION: 1. Generalized cerebral atrophy and microvascular disease changes of the supratentorial brain. 2. No acute intracranial abnormality. Electronically Signed   By: Suzen Dials M.D.   On: 01/02/2024 16:17   CT ABDOMEN PELVIS W CONTRAST Result Date: 01/02/2024 CLINICAL DATA:  Acute nonlocalized abdominal pain. Increasing weakness over the past week. EXAM: CT ABDOMEN AND PELVIS WITH CONTRAST TECHNIQUE: Multidetector CT imaging of the abdomen and pelvis was performed using the standard protocol following bolus administration of intravenous contrast. RADIATION DOSE REDUCTION: This exam was performed according to the departmental dose-optimization program which includes automated exposure control, adjustment of the mA and/or kV according to patient size and/or use of iterative reconstruction technique. CONTRAST:   100mL OMNIPAQUE IOHEXOL 300 MG/ML  SOLN COMPARISON:  05/05/2011 FINDINGS: Lower chest: Infiltration or atelectasis in the lung bases. Small esophageal hiatal hernia. Hepatobiliary: No focal liver lesions. Gallbladder is not visualized, possibly contracted or surgically absent. No bile duct dilatation. Pancreas: Unremarkable. No pancreatic ductal dilatation or surrounding inflammatory changes. Spleen: Normal in size without focal abnormality. Adrenals/Urinary Tract: No adrenal gland nodules. Kidneys are symmetrical. Moderate left hydronephrosis and hydroureter. Hydroureter extends down to the level of the bladder. Unfortunately due to streak artifact from hip arthroplasties, the distal ureter and bladder are not well visualized. Changes could represent obstruction due to a nonvisualized distal ureteral stone or this could be due to reflux disease. The bladder is decompressed with a Foley catheter. Stomach/Bowel: Stomach, small bowel, and colon are not abnormally distended. No wall thickening or inflammatory stranding. Appendix is not identified. Vascular/Lymphatic: Aortic atherosclerosis. No enlarged abdominal or pelvic lymph nodes. Reproductive: Uterus and bilateral adnexa are unremarkable. Other: Small amount of free fluid in the pelvis is nonspecific, probably reactive. Broad-based ventral abdominal wall hernia at the umbilicus containing fat and transverse colon without proximal obstruction. Musculoskeletal: Degenerative changes in the spine. Mild anterior subluxation of L4 on L5 is likely degenerative. Bilateral total hip arthroplasties. IMPRESSION: 1. Left renal hydronephrosis and hydroureter to the level of the bladder. Distal ureter and bladder are not well visualized due to streak artifact. Changes may indicate obstruction due to a nonvisualized distal ureteral stone or reflux. 2. No evidence of bowel obstruction or inflammation. 3. Mild aortic atherosclerosis. 4. Infiltration or atelectasis seen in both  lung bases. 5. Small esophageal hiatal hernia. Broad-based ventral abdominal wall hernia containing colon but without proximal obstruction. Electronically Signed   By: Elsie Gravely M.D.   On: 01/02/2024 16:09   DG Chest 2 View Result Date: 01/02/2024 CLINICAL DATA:  Shortness of breath.  AMS. EXAM: CHEST - 2 VIEW COMPARISON:  12/06/2023. FINDINGS: The heart size and mediastinal contours are unchanged. Aortic atherosclerosis. No overt edema, focal consolidation, pleural effusion, or pneumothorax. No acute osseous abnormality. IMPRESSION: 1. No acute cardiopulmonary findings. 2.  Aortic Atherosclerosis (ICD10-I70.0). Electronically Signed   By: Harrietta Sherry M.D.   On: 01/02/2024 13:23    Scheduled Meds:  divalproex   250 mg Oral TID   hydrALAZINE   25 mg Oral Q8H   lacosamide   50 mg Oral BID   lamoTRIgine   25 mg Oral BID   levETIRAcetam   500 mg Oral BID   Continuous Infusions:  cefTRIAXone  (ROCEPHIN )  IV     sodium  chloride 0.9 % 1,000 mL with potassium chloride  40 mEq infusion       LOS: 1 day   Fredia Skeeter, MD [facility]  01/03/2024, 8:46 AM   *Please note that this is a verbal dictation therefore any spelling or grammatical errors are due to the Dragon Medical One system interpretation.  Please page via Amion and do not message via secure chat for urgent patient care matters. Secure chat can be used for non urgent patient care matters.  How to contact the [facility] Attending or Consulting provider 7A - 7P or covering provider during after hours 7P -7A, for this patient?  Check the care team in [facility] and look for a) attending/consulting [facility] provider listed and b) the [facility] team listed. Page or secure chat 7A-7P. Log into [email or web address] and use 's universal password to access. If you do not have the password, please contact the hospital operator. Locate the [facility] provider you are looking for under [facility] and page to a number that you can be directly  reached. If you still have difficulty reaching the provider, please page the [facility] (Director on Call) for the Hospitalists listed on amion for assistance.

## 2024-01-03 NOTE — Anesthesia Preprocedure Evaluation (Signed)
 Anesthesia Evaluation  Patient identified by MRN, date of birth, ID band Patient awake and Patient confused  General Assessment Comment:Patient knows her name. Unable to tell birthday, or what procedure she is having done.  Reviewed: Allergy & Precautions, NPO status , Patient's Chart, lab work & pertinent test results  History of Anesthesia Complications Negative for: history of anesthetic complications  Airway Mallampati: III  TM Distance: >3 FB Neck ROM: Full    Dental no notable dental hx. (+) Teeth Intact   Pulmonary neg pulmonary ROS, neg sleep apnea, neg COPD, Patient abstained from smoking.Not current smoker   Pulmonary exam normal breath sounds clear to auscultation       Cardiovascular Exercise Tolerance: Poor METShypertension, (-) CAD and (-) Past MI (-) dysrhythmias  Rhythm:Regular Rate:Normal - Systolic murmurs    Neuro/Psych  Headaches, Seizures -, Well Controlled,  PSYCHIATRIC DISORDERS     Dementia    GI/Hepatic ,neg GERD  ,,(+)     (-) substance abuse    Endo/Other  neg diabetes    Renal/GU Renal disease     Musculoskeletal   Abdominal   Peds  Hematology Patient has a flag for blood product refusal in Epic, but I spoke with her next of kin (daughter) Landscape architect by phone, who claims no knowledge of this, and says to give blood in an emergency if needed.   Anesthesia Other Findings Past Medical History: No date: Arthritis 2008: Cancer (HCC)     Comment:  anal No date: Hypertension No date: IBS (irritable bowel syndrome) No date: IBS (irritable bowel syndrome) No date: Leg fracture, left No date: Recurrent headache No date: Sarcoidosis of lung No date: Seizures (HCC)  Reproductive/Obstetrics                              Anesthesia Physical Anesthesia Plan  ASA: 3  Anesthesia Plan: General   Post-op Pain Management:    Induction: Intravenous and Rapid  sequence  PONV Risk Score and Plan: 4 or greater and Ondansetron  and Dexamethasone   Airway Management Planned: Oral ETT  Additional Equipment: None  Intra-op Plan:   Post-operative Plan: Extubation in OR  Informed Consent: I have reviewed the patients History and Physical, chart, labs and discussed the procedure including the risks, benefits and alternatives for the proposed anesthesia with the patient or authorized representative who has indicated his/her understanding and acceptance.     Dental advisory given and Consent reviewed with POA  Plan Discussed with: CRNA and Surgeon  Anesthesia Plan Comments: (Discussed risks of anesthesia with patient's daughter Veyda Kaufman by phone (due to confusion) including PONV, sore throat, lip/dental/eye damage. Rare risks discussed as well, such as cardiorespiratory and neurological sequelae, and allergic reactions. Discussed the role of CRNA in patient's perioperative care. Patient's daughter understands.)        Anesthesia Quick Evaluation

## 2024-01-03 NOTE — Anesthesia Postprocedure Evaluation (Signed)
 Anesthesia Post Note  Patient: Taia Bramlett  Procedure(s) Performed: CYSTOSCOPY, WITH RETROGRADE PYELOGRAM AND URETERAL STENT INSERTION (Left)     Patient location during evaluation: PACU Anesthesia Type: General Level of consciousness: awake and alert Pain management: pain level controlled Vital Signs Assessment: post-procedure vital signs reviewed and stable Respiratory status: spontaneous breathing, nonlabored ventilation, respiratory function stable and patient connected to nasal cannula oxygen Cardiovascular status: blood pressure returned to baseline and stable Postop Assessment: no apparent nausea or vomiting Anesthetic complications: no   No notable events documented.  Last Vitals:  Vitals:   01/03/24 1315 01/03/24 1330  BP: (!) 140/69 (!) 144/73  Pulse: 90 90  Resp: 18 17  Temp:    SpO2: 100% 92%    Last Pain:  Vitals:   01/03/24 1330  TempSrc:   PainSc: 0-No pain                 Rome Ade

## 2024-01-03 NOTE — Consult Note (Addendum)
 Urology Consult Note   Requesting Attending Physician:  [name], MD Service Providing Consult: Urology  Consulting Attending: [name]   Reason for Consult:  left hydronephrosis, undifferntiated  HPI: Alicia Brennan is seen in consultation for reasons noted above at the request of [name], MD. Patient is a 73 y.o. female presenting to the emergency department from her facility with complaint of AMS, intermittent N/V, possible seizure earlier today, and generalized weakness.  PMH significant for vascular dementia, seizure disorder, pulmonary sarcoidosis, and hypertension.  She was recently hospitalized from 9/11 through 9/16 for acute metabolic encephalopathy.  On my arrival today patient was extremely sedated.  Daughter confirms far from her baseline.  She was able to answer questions clearly but I struggle to keep her awake.  Unclear whether or not she has received pain medication or she is just extremely encephalopathic.  Reviewed case and plan with her daughter by phone who is amenable to proceeding to the OR for cystoscopy with left retrograde pyelogram and ureteral stent placement.  ------------------  Assessment:   73 y.o. female with left hydroureteronephrosis and AMS  Unclear etiology of left hydroureteronephrosis.  This continues down to the level of the bladder where virtually all visualization is compromised due to artifact emanating from bilateral hip prosthesis.  Differentials include distal stone or possible distal ureteral granuloma 2/2 to her sarcoidosis, though this is only been observed as a pulmonary manifestation to date.  Reviewed case and plan with daughter and [name].  Shared decision made to proceed to the OR this afternoon.  Tentatively 1 PM.  Remain NPO.  Recommendations: #left hydroureternephrosis    Case and plan discussed with 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    Hypertension    IBS (irritable bowel syndrome)    IBS (irritable bowel syndrome)    Leg fracture, left    Recurrent headache    Sarcoidosis of lung    Seizures (HCC)     Past Surgical History:  Past Surgical History:  Procedure Laterality Date   BILATERAL ANTERIOR TOTAL HIP ARTHROPLASTY Bilateral 11/15/2015   Procedure: BILATERAL ANTERIOR TOTAL HIP ARTHROPLASTY;  Surgeon: Redell Shoals, MD;  Location: [facility] OR;  Service: Orthopedics;  Laterality: Bilateral;   CARPAL TUNNEL RELEASE Bilateral    CHOLECYSTECTOMY     COLONOSCOPY     EYE SURGERY     r/t sarcoidisis   ORIF ELBOW FRACTURE Left    TIBIA FRACTURE SURGERY Left     Medication: Current Facility-Administered Medications  Medication Dose Route Frequency Provider Last Rate Last Admin   acetaminophen  (TYLENOL ) tablet 650 mg  650 mg Oral Q6H PRN Alfornia Madison, MD       Or   acetaminophen  (TYLENOL ) suppository 650 mg  650 mg Rectal Q6H PRN Alfornia Madison, MD       cefTRIAXone  (ROCEPHIN ) 1 g in sodium chloride  0.9 % 100 mL IVPB  1 g Intravenous Q24H Rathore, Vasundhra, MD       divalproex  (DEPAKOTE ) DR tablet 250 mg  250 mg Oral TID Rathore, Vasundhra, MD   250 mg at 01/03/24 9278   hydrALAZINE  (APRESOLINE ) injection 5 mg  5 mg Intravenous Q4H PRN Alfornia Madison, MD       hydrALAZINE  (APRESOLINE ) tablet 25 mg  25 mg Oral Q8H Rathore, Vasundhra, MD   25 mg at 01/03/24 0720   lacosamide  (VIMPAT ) tablet 50 mg  50 mg  Oral BID Rathore, Vasundhra, MD   50 mg at 01/02/24 2349   lamoTRIgine  (LAMICTAL ) tablet 25 mg  25 mg Oral BID Bauer, Collin S, PA-C   25 mg at 01/02/24 2226   levETIRAcetam  (KEPPRA ) tablet 500 mg  500 mg Oral BID Alfornia Madison, MD       Current Outpatient Medications  Medication Sig Dispense Refill   divalproex  (DEPAKOTE ) 250 MG DR tablet Take 1 tablet (250 mg total) by mouth 3 (three) times daily. (Patient taking differently: Take 250 mg by mouth See admin instructions. Take 250 mg by mouth at 6 AM, 2  PM, and 10 PM) 90 tablet 0   furosemide  (LASIX ) 40 MG tablet Take 1 tablet (40 mg total) by mouth daily.     hydrALAZINE  (APRESOLINE ) 25 MG tablet Take 1 tablet (25 mg total) by mouth every 8 (eight) hours. (Patient taking differently: Take 25 mg by mouth See admin instructions. Take 25 mg by mouth every eight hours and hold for a Systolic reading less than 100, Diastolic reading less than 90, or a heart rate less than 60)     lacosamide  (VIMPAT ) 50 MG TABS tablet Take 1 tablet (50 mg total) by mouth 2 (two) times daily. 180 tablet 0   lactulose  (CHRONULAC ) 10 GM/15ML solution Take 15 mLs (10 g total) by mouth daily.     lamoTRIgine  (LAMICTAL ) 25 MG tablet Take 25 mg by mouth 2 (two) times daily.     levETIRAcetam  (KEPPRA ) 500 MG tablet Take 1 tablet (500 mg total) by mouth 2 (two) times daily.     NAYZILAM  5 MG/0.1ML SOLN GIVE ONE SPRAY INTO 1 NOSTRIL. IF NO RESPONSE IN 10 MINUTES, MAY GIVE SECOND SPRAY IN OTHER NOSTRIL. DO NOT GIVE SECOND DOSE IF PATIENT HAS TROUBLE BREATHING OR EXCESSIVE SEDATION, MAX 2 DOSES/SEIZURE EPISODE, 1 EPISODE EVERY 3 DAYS OR 5 EPISODES/MONTH Nasal for 6 Days     telmisartan (MICARDIS) 80 MG tablet Take 80 mg by mouth daily.      Allergies: No Known Allergies  Social History: Social History   Tobacco Use   Smoking status: Never   Smokeless tobacco: Never  Vaping Use   Vaping status: Never Used  Substance Use Topics   Alcohol use: No    Alcohol/week: 0.0 standard drinks of alcohol   Drug use: No    Family History Family History  Problem Relation Age of Onset   Dementia Mother    High blood pressure Father    Dementia Maternal Aunt    Sarcoidosis Son    Seizures Neg Hx     Review of Systems  Gastrointestinal:  Positive for nausea and vomiting.  Genitourinary:  Positive for flank pain, frequency and hematuria. Negative for dysuria and urgency.     Objective   Vital signs in last 24 hours: BP 105/63 (BP Location: Right Arm)   Pulse 68   Temp  98.1 F (36.7 C) (Oral)   Resp 16   Ht 5' 5 (1.651 m)   SpO2 96%   BMI 30.71 kg/m   Physical Exam General: A&O, resting, appropriate HEENT: [place]/AT Pulmonary: Normal work of breathing Cardiovascular: no cyanosis Abdomen: Soft, NTTP, nondistended GU: foley catheter in place   Most Recent Labs: Lab Results  Component Value Date   WBC 6.2 01/03/2024   HGB 14.8 01/03/2024   HCT 47.0 (H) 01/03/2024   PLT 145 (L) 01/03/2024    Lab Results  Component Value Date   NA 136 01/03/2024   K  3.4 (L) 01/03/2024   CL 100 01/03/2024   CO2 21 (L) 01/03/2024   BUN 16 01/03/2024   CREATININE 1.17 (H) 01/03/2024   CALCIUM 9.5 01/03/2024   MG 2.1 09/11/2023    Lab Results  Component Value Date   INR 1.0 12/06/2023   APTT 30 11/27/2021     Urine Culture: @[id number] (laburin,org,[id number])@   IMAGING: MR BRAIN WO CONTRAST Result Date: 01/02/2024 EXAM: MR Brain without Intravenous Contrast. CLINICAL HISTORY: Neuro deficit, acute, stroke suspected. Neuro deficit; pt has had weakness over the last week. TECHNIQUE: Magnetic resonance images of the brain without intravenous contrast in multiple planes. CONTRAST: Without. COMPARISON: CT head from earlier today. MRI head 09/12/2023. FINDINGS: BRAIN: No restricted diffusion to indicate acute infarction. No intracranial mass or hemorrhage. No midline shift or extra-axial fluid collection. The central arterial and venous flow voids are patent. Cerebral atrophy. Mild for age T2 FLAIR hyperintensities of the white matter, compatible with chronic microvascular ischemic changes. VENTRICLES: No hydrocephalus. ORBITS: The orbits are normal. SINUSES AND MASTOIDS: The sinuses and mastoid air cells are clear. BONES: No acute fracture or focal osseous lesion. IMPRESSION: 1. No acute abnormality. Electronically signed by: Gilmore Molt MD 01/02/2024 07:00 PM EDT RP Workstation: [id number]   CT Head Wo Contrast Result Date: 01/02/2024 CLINICAL DATA:   Weakness and fatigue. EXAM: CT HEAD WITHOUT CONTRAST TECHNIQUE: Contiguous axial images were obtained from the base of the skull through the vertex without intravenous contrast. RADIATION DOSE REDUCTION: This exam was performed according to the departmental dose-optimization program which includes automated exposure control, adjustment of the mA and/or kV according to patient size and/or use of iterative reconstruction technique. COMPARISON:  December 06, 2023 FINDINGS: Brain: There is generalized cerebral atrophy with widening of the extra-axial spaces and ventricular dilatation. There are areas of decreased attenuation within the white matter tracts of the supratentorial brain, consistent with microvascular disease changes. Vascular: No hyperdense vessel or unexpected calcification. Skull: Normal. Negative for fracture or focal lesion. Sinuses/Orbits: No acute finding. Other: None. IMPRESSION: 1. Generalized cerebral atrophy and microvascular disease changes of the supratentorial brain. 2. No acute intracranial abnormality. Electronically Signed   By: Suzen Dials M.D.   On: 01/02/2024 16:17   CT ABDOMEN PELVIS W CONTRAST Result Date: 01/02/2024 CLINICAL DATA:  Acute nonlocalized abdominal pain. Increasing weakness over the past week. EXAM: CT ABDOMEN AND PELVIS WITH CONTRAST TECHNIQUE: Multidetector CT imaging of the abdomen and pelvis was performed using the standard protocol following bolus administration of intravenous contrast. RADIATION DOSE REDUCTION: This exam was performed according to the departmental dose-optimization program which includes automated exposure control, adjustment of the mA and/or kV according to patient size and/or use of iterative reconstruction technique. CONTRAST:  100mL OMNIPAQUE IOHEXOL 300 MG/ML  SOLN COMPARISON:  05/05/2011 FINDINGS: Lower chest: Infiltration or atelectasis in the lung bases. Small esophageal hiatal hernia. Hepatobiliary: No focal liver lesions.  Gallbladder is not visualized, possibly contracted or surgically absent. No bile duct dilatation. Pancreas: Unremarkable. No pancreatic ductal dilatation or surrounding inflammatory changes. Spleen: Normal in size without focal abnormality. Adrenals/Urinary Tract: No adrenal gland nodules. Kidneys are symmetrical. Moderate left hydronephrosis and hydroureter. Hydroureter extends down to the level of the bladder. Unfortunately due to streak artifact from hip arthroplasties, the distal ureter and bladder are not well visualized. Changes could represent obstruction due to a nonvisualized distal ureteral stone or this could be due to reflux disease. The bladder is decompressed with a Foley catheter. Stomach/Bowel: Stomach, small bowel, and colon are not  abnormally distended. No wall thickening or inflammatory stranding. Appendix is not identified. Vascular/Lymphatic: Aortic atherosclerosis. No enlarged abdominal or pelvic lymph nodes. Reproductive: Uterus and bilateral adnexa are unremarkable. Other: Small amount of free fluid in the pelvis is nonspecific, probably reactive. Broad-based ventral abdominal wall hernia at the umbilicus containing fat and transverse colon without proximal obstruction. Musculoskeletal: Degenerative changes in the spine. Mild anterior subluxation of L4 on L5 is likely degenerative. Bilateral total hip arthroplasties. IMPRESSION: 1. Left renal hydronephrosis and hydroureter to the level of the bladder. Distal ureter and bladder are not well visualized due to streak artifact. Changes may indicate obstruction due to a nonvisualized distal ureteral stone or reflux. 2. No evidence of bowel obstruction or inflammation. 3. Mild aortic atherosclerosis. 4. Infiltration or atelectasis seen in both lung bases. 5. Small esophageal hiatal hernia. Broad-based ventral abdominal wall hernia containing colon but without proximal obstruction. Electronically Signed   By: Elsie Gravely M.D.   On: 01/02/2024  16:09   DG Chest 2 View Result Date: 01/02/2024 CLINICAL DATA:  Shortness of breath.  AMS. EXAM: CHEST - 2 VIEW COMPARISON:  12/06/2023. FINDINGS: The heart size and mediastinal contours are unchanged. Aortic atherosclerosis. No overt edema, focal consolidation, pleural effusion, or pneumothorax. No acute osseous abnormality. IMPRESSION: 1. No acute cardiopulmonary findings. 2.  Aortic Atherosclerosis (ICD10-I70.0). Electronically Signed   By: Harrietta Sherry M.D.   On: 01/02/2024 13:23    ------  Ole Bourdon, NP Pager: [phone]   Please contact the urology consult pager with any further questions/concerns.

## 2024-01-03 NOTE — Procedures (Signed)
 Patient Name: Alicia Brennan  MRN: [id number]  Epilepsy Attending: Arlin MALVA Krebs  Referring Physician/Provider: Alfornia Madison, MD Date: 01/03/2024 Duration: 25.44 mins  Patient history: 73yo F with breakthrough sz. EEG to evaluate for seizure  Level of alertness: Awake  AEDs during EEG study: LEV, LCM LTG, VPA  Technical aspects: This EEG study was done with scalp electrodes positioned according to the 10-20 International system of electrode placement. Electrical activity was reviewed with band pass filter of 1-70Hz , sensitivity of 7 uV/mm, display speed of 62mm/sec with a 60Hz  notched filter applied as appropriate. EEG data were recorded continuously and digitally stored.  Video monitoring was available and reviewed as appropriate.  Description: The posterior dominant rhythm consists of 8 Hz activity of moderate voltage (25-35 uV) seen predominantly in posterior head regions, symmetric and reactive to eye opening and eye closing. Hyperventilation and photic stimulation were not performed.     IMPRESSION: This study is within normal limits. No seizures or epileptiform discharges were seen throughout the recording.  A normal interictal EEG does not exclude the diagnosis of epilepsy.   Karlie Aung O Zvi Duplantis

## 2024-01-03 NOTE — Progress Notes (Signed)
 EEG complete - results pending

## 2024-01-03 NOTE — Transfer of Care (Signed)
 Immediate Anesthesia Transfer of Care Note  Patient: Alicia Brennan  Procedure(s) Performed: CYSTOSCOPY, WITH RETROGRADE PYELOGRAM AND URETERAL STENT INSERTION (Left)  Patient Location: PACU  Anesthesia Type:General  Level of Consciousness: awake  Airway & Oxygen Therapy: Patient Spontanous Breathing and Patient connected to face mask oxygen  Post-op Assessment: Report given to RN  Post vital signs: Reviewed and stable  Last Vitals:  Vitals Value Taken Time  BP 142/65 01/03/24 13:06  Temp    Pulse 88 01/03/24 13:08  Resp 17 01/03/24 13:08  SpO2 100 % 01/03/24 13:08  Vitals shown include unfiled device data.  Last Pain:  Vitals:   01/03/24 1101  TempSrc: Oral  PainSc:          Complications: No notable events documented.

## 2024-01-04 ENCOUNTER — Encounter (HOSPITAL_COMMUNITY): Payer: Self-pay | Admitting: Urology

## 2024-01-04 DIAGNOSIS — N390 Urinary tract infection, site not specified: Secondary | ICD-10-CM

## 2024-01-04 DIAGNOSIS — G9340 Encephalopathy, unspecified: Secondary | ICD-10-CM

## 2024-01-04 DIAGNOSIS — G40919 Epilepsy, unspecified, intractable, without status epilepticus: Secondary | ICD-10-CM | POA: Diagnosis not present

## 2024-01-04 DIAGNOSIS — N179 Acute kidney failure, unspecified: Secondary | ICD-10-CM

## 2024-01-04 DIAGNOSIS — N1330 Unspecified hydronephrosis: Secondary | ICD-10-CM

## 2024-01-04 LAB — BASIC METABOLIC PANEL WITH GFR
Anion gap: 12 (ref 5–15)
BUN: 28 mg/dL — ABNORMAL HIGH (ref 8–23)
CO2: 20 mmol/L — ABNORMAL LOW (ref 22–32)
Calcium: 9.2 mg/dL (ref 8.9–10.3)
Chloride: 102 mmol/L (ref 98–111)
Creatinine, Ser: 1.42 mg/dL — ABNORMAL HIGH (ref 0.44–1.00)
GFR, Estimated: 39 mL/min — ABNORMAL LOW (ref >60)
Glucose, Bld: 106 mg/dL — ABNORMAL HIGH (ref 70–99)
Potassium: 4.4 mmol/L (ref 3.5–5.1)
Sodium: 134 mmol/L — ABNORMAL LOW (ref 135–145)

## 2024-01-04 LAB — CBC WITH DIFFERENTIAL/PLATELET
Abs Immature Granulocytes: 0.04 K/uL (ref 0.00–0.07)
Basophils Absolute: 0 K/uL (ref 0.0–0.1)
Basophils Relative: 0 %
Eosinophils Absolute: 0 K/uL (ref 0.0–0.5)
Eosinophils Relative: 0 %
HCT: 39.2 % (ref 36.0–46.0)
Hemoglobin: 12.2 g/dL (ref 12.0–15.0)
Immature Granulocytes: 1 %
Lymphocytes Relative: 16 %
Lymphs Abs: 1.2 K/uL (ref 0.7–4.0)
MCH: 29.3 pg (ref 26.0–34.0)
MCHC: 31.1 g/dL (ref 30.0–36.0)
MCV: 94.2 fL (ref 80.0–100.0)
Monocytes Absolute: 0.5 K/uL (ref 0.1–1.0)
Monocytes Relative: 7 %
Neutro Abs: 6 K/uL (ref 1.7–7.7)
Neutrophils Relative %: 76 %
Platelets: 145 K/uL — ABNORMAL LOW (ref 150–400)
RBC: 4.16 MIL/uL (ref 3.87–5.11)
RDW: 13.7 % (ref 11.5–15.5)
WBC: 7.8 K/uL (ref 4.0–10.5)
nRBC: 0 % (ref 0.0–0.2)

## 2024-01-04 LAB — URINE CULTURE: Culture: >=100000 — AB

## 2024-01-04 LAB — LEVETIRACETAM LEVEL: Levetiracetam Lvl: 28.6 ug/mL (ref 10.0–40.0)

## 2024-01-04 MED ORDER — LEVETIRACETAM 500 MG PO TABS
500.0000 mg | ORAL_TABLET | Freq: Two times a day (BID) | ORAL | Status: DC
  Administered 2024-01-04 – 2024-01-10 (×12): 500 mg via ORAL
  Filled 2024-01-04 (×12): qty 1

## 2024-01-04 MED ORDER — LACTATED RINGERS IV SOLN: INTRAVENOUS | Status: DC

## 2024-01-04 MED ORDER — CEFADROXIL 500 MG PO CAPS
500.0000 mg | ORAL_CAPSULE | Freq: Two times a day (BID) | ORAL | Status: DC
  Administered 2024-01-04 – 2024-01-10 (×12): 500 mg via ORAL
  Filled 2024-01-04 (×13): qty 1

## 2024-01-04 MED ORDER — DIVALPROEX SODIUM 250 MG PO DR TAB
250.0000 mg | DELAYED_RELEASE_TABLET | Freq: Three times a day (TID) | ORAL | Status: DC
  Administered 2024-01-05 – 2024-01-10 (×16): 250 mg via ORAL
  Filled 2024-01-04 (×16): qty 1

## 2024-01-04 MED ORDER — LACOSAMIDE 50 MG PO TABS
50.0000 mg | ORAL_TABLET | Freq: Two times a day (BID) | ORAL | Status: DC
  Administered 2024-01-04 – 2024-01-10 (×12): 50 mg via ORAL
  Filled 2024-01-04 (×12): qty 1

## 2024-01-04 NOTE — Plan of Care (Signed)

## 2024-01-04 NOTE — Hospital Course (Addendum)
 73 year old woman PMH including vascular dementia, seizure disorder, presented from SNF for altered mental status, generalized weakness and possible seizure, abdominal pain nausea and vomiting, concern for not taking medications.  Recent COVID diagnosis.  Also found to have hydronephrosis and urology consulted.  Admitted for breakthrough seizure, complicated UTI, left renal hydronephrosis and hydroureter, AKI.  Condition stabilized, no further seizures.  Foley catheter will remain in place and she will follow-up with urology as an outpatient.  Plan for SNF.  Consultants Urology   Procedures/Events 10/8 admission 10/9 cystoscopy, left ureteral stent placement

## 2024-01-04 NOTE — Progress Notes (Signed)
  Progress Note   Patient: Alicia Brennan [name] DOB: 1950-10-19 DOA: 01/02/2024     2 DOS: the patient was seen and examined on 01/04/2024   Brief hospital course: 73 year old woman PMH including vascular dementia, seizure disorder, presented from SNF for altered mental status, generalized weakness and possible seizure, abdominal pain nausea and vomiting, concern for not taking medications.  Recent COVID diagnosis.  Also found to have hydronephrosis and urology consulted.  Admitted for breakthrough seizure, complicated UTI, left renal hydronephrosis and hydroureter, AKI.  Consultants Urology   Procedures/Events 10/8 admission 10/9 cystoscopy, left ureteral stent placement  Assessment and Plan: Breakthrough seizure Acute encephalopathy secondary to above In context of nausea and vomiting, inability to tolerate medications.  CT head and MRI brain no acute abnormalities. Neurology recommended resuming antiepileptics. Now tolerating oral medications divalproex , Vimpat , Lamictal , Keppra  EEG was unrevealing  Complicated E. coli UTI Left renal hydronephrosis, hydroureter Seen on CT Seen by urology, underwent stent placement, will follow-up in 2 weeks to reassess urinalysis and plan ureteroscopically Narrow antibiotics to oral therapy, treat 7 days.  AKI Multifactorial, dehydration, obstruction Holding Lasix , telmisartan IV fluids.  BMP in AM.  Abnormal EKG Elevated troponin Hypertensive urgency EKG nonspecific.  No signs or symptoms to suggest ACS.  Troponin elevation trivial.  No further evaluation suggested.  Amlodipine  was stopped last hospitalization for lower extremity edema.  Currently holding Lasix  and telmisartan. Blood pressure stable.  Vascular dementia  Overall stabilizing.  Can likely return to SNF tomorrow if creatinine stable.     Subjective:  Feels okay today  Physical Exam: Vitals:   01/03/24 2250 01/04/24 0432 01/04/24 0800 01/04/24 1354  BP:  109/64 (!) 92/48  (!) 109/57  Pulse: 93 73  76  Resp:    16  Temp: 99.3 F (37.4 C) 97.8 F (36.6 C)  98.5 F (36.9 C)  TempSrc: Oral Oral  Oral  SpO2: 94% 93%  98%  Weight:   83.9 kg   Height:       Physical Exam Vitals reviewed.  Constitutional:      General: She is not in acute distress.    Appearance: She is not ill-appearing or toxic-appearing.  Cardiovascular:     Rate and Rhythm: Normal rate and regular rhythm.     Heart sounds: No murmur heard. Pulmonary:     Effort: Pulmonary effort is normal. No respiratory distress.     Breath sounds: No wheezing, rhonchi or rales.  Neurological:     Mental Status: She is alert.  Psychiatric:        Mood and Affect: Mood normal.        Behavior: Behavior normal.     Data Reviewed: Creatinine labile, 1.42 today, probably postprocedure Platelets stable 145  Family Communication: none  Disposition: Status is: Inpatient Remains inpatient appropriate because: SNF     Time spent: 35 minutes  Author: Toribio Door, MD 01/04/2024 5:46 PM  For on call review [email or web address].

## 2024-01-04 NOTE — Progress Notes (Signed)
 1 Day Post-Op Subjective: NAEON.  Patient is up in bed, pleasantly confused.  Looks much better this morning.  Objective: Vital signs in last 24 hours: Temp:  [97.7 F (36.5 C)-99.3 F (37.4 C)] 97.8 F (36.6 C) (10/10 0432) Pulse Rate:  [73-93] 73 (10/10 0432) Resp:  [14-19] 17 (10/09 1851) BP: (92-144)/(48-73) 92/48 (10/10 0432) SpO2:  [92 %-100 %] 93 % (10/10 0432) Weight:  [83.9 kg] 83.9 kg (10/10 0800)  Assessment/Plan: #Left ureteral obstruction # E. coli UTI # AKI  Continuing culture directed ABX.  Trend labs.  Interval elevation of serum creatinine, not an unexpected finding the day after instrumentation.  This should be self-limiting with time and hydration.  To the OR with [name] on 01/03/2024 for left-sided hydronephrosis in the context of urinary tract infection.  No clear blockage was identified during stent placement.  Will plan for her to follow-up in approximately 2 weeks, ensure that bacteria is cleared from her urine, and schedule ureteroscopy to rule out distal ureteral stone versus other obstruction.  Okay to discharge home once cleared medically.  Please call with questions.  Intake/Output from previous day: 10/09 0701 - 10/10 0700 In: 2783.7 [P.O.:720; I.V.:1876.2; IV Piggyback:187.5] Out: 925 [Urine:925]  Intake/Output this shift: No intake/output data recorded.  Physical Exam:  General: Dementia CV: No cyanosis Lungs: equal chest rise Gu: Foley catheter draining dark concentrated clear urine.  Lab Results: Recent Labs    01/02/24 1350 01/03/24 0522 01/04/24 0345  HGB 16.0* 14.8 12.2  HCT 47.0* 47.0* 39.2   BMET Recent Labs    01/03/24 0522 01/04/24 0345  NA 136 134*  K 3.4* 4.4  CL 100 102  CO2 21* 20*  GLUCOSE 91 106*  BUN 16 28*  CREATININE 1.17* 1.42*  CALCIUM 9.5 9.2  HGB 14.8 12.2  WBC 6.2 7.8     Studies/Results: EEG adult Result Date: 01/03/2024 Shelton Arlin KIDD, MD     01/03/2024  3:31 PM Patient Name:  Alicia Brennan MRN: [id number] Epilepsy Attending: Arlin KIDD Shelton Referring Physician/Provider: Alfornia Madison, MD Date: 01/03/2024 Duration: 25.44 mins Patient history: 73yo F with breakthrough sz. EEG to evaluate for seizure Level of alertness: Awake AEDs during EEG study: LEV, LCM LTG, VPA Technical aspects: This EEG study was done with scalp electrodes positioned according to the 10-20 International system of electrode placement. Electrical activity was reviewed with band pass filter of 1-70Hz , sensitivity of 7 uV/mm, display speed of 10mm/sec with a 60Hz  notched filter applied as appropriate. EEG data were recorded continuously and digitally stored.  Video monitoring was available and reviewed as appropriate. Description: The posterior dominant rhythm consists of 8 Hz activity of moderate voltage (25-35 uV) seen predominantly in posterior head regions, symmetric and reactive to eye opening and eye closing. Hyperventilation and photic stimulation were not performed.   IMPRESSION: This study is within normal limits. No seizures or epileptiform discharges were seen throughout the recording. A normal interictal EEG does not exclude the diagnosis of epilepsy. Arlin KIDD Shelton   DG C-Arm 1-60 Min-No Report Result Date: 01/03/2024 Fluoroscopy was utilized by the requesting physician.  No radiographic interpretation.   MR BRAIN WO CONTRAST Result Date: 01/02/2024 EXAM: MR Brain without Intravenous Contrast. CLINICAL HISTORY: Neuro deficit, acute, stroke suspected. Neuro deficit; pt has had weakness over the last week. TECHNIQUE: Magnetic resonance images of the brain without intravenous contrast in multiple planes. CONTRAST: Without. COMPARISON: CT head from earlier today. MRI head 09/12/2023. FINDINGS: BRAIN: No restricted diffusion to  indicate acute infarction. No intracranial mass or hemorrhage. No midline shift or extra-axial fluid collection. The central arterial and venous flow voids are patent.  Cerebral atrophy. Mild for age T2 FLAIR hyperintensities of the white matter, compatible with chronic microvascular ischemic changes. VENTRICLES: No hydrocephalus. ORBITS: The orbits are normal. SINUSES AND MASTOIDS: The sinuses and mastoid air cells are clear. BONES: No acute fracture or focal osseous lesion. IMPRESSION: 1. No acute abnormality. Electronically signed by: Gilmore Molt MD 01/02/2024 07:00 PM EDT RP Workstation: [id number]   CT Head Wo Contrast Result Date: 01/02/2024 CLINICAL DATA:  Weakness and fatigue. EXAM: CT HEAD WITHOUT CONTRAST TECHNIQUE: Contiguous axial images were obtained from the base of the skull through the vertex without intravenous contrast. RADIATION DOSE REDUCTION: This exam was performed according to the departmental dose-optimization program which includes automated exposure control, adjustment of the mA and/or kV according to patient size and/or use of iterative reconstruction technique. COMPARISON:  December 06, 2023 FINDINGS: Brain: There is generalized cerebral atrophy with widening of the extra-axial spaces and ventricular dilatation. There are areas of decreased attenuation within the white matter tracts of the supratentorial brain, consistent with microvascular disease changes. Vascular: No hyperdense vessel or unexpected calcification. Skull: Normal. Negative for fracture or focal lesion. Sinuses/Orbits: No acute finding. Other: None. IMPRESSION: 1. Generalized cerebral atrophy and microvascular disease changes of the supratentorial brain. 2. No acute intracranial abnormality. Electronically Signed   By: Suzen Dials M.D.   On: 01/02/2024 16:17   CT ABDOMEN PELVIS W CONTRAST Result Date: 01/02/2024 CLINICAL DATA:  Acute nonlocalized abdominal pain. Increasing weakness over the past week. EXAM: CT ABDOMEN AND PELVIS WITH CONTRAST TECHNIQUE: Multidetector CT imaging of the abdomen and pelvis was performed using the standard protocol following bolus  administration of intravenous contrast. RADIATION DOSE REDUCTION: This exam was performed according to the departmental dose-optimization program which includes automated exposure control, adjustment of the mA and/or kV according to patient size and/or use of iterative reconstruction technique. CONTRAST:  100mL OMNIPAQUE IOHEXOL 300 MG/ML  SOLN COMPARISON:  05/05/2011 FINDINGS: Lower chest: Infiltration or atelectasis in the lung bases. Small esophageal hiatal hernia. Hepatobiliary: No focal liver lesions. Gallbladder is not visualized, possibly contracted or surgically absent. No bile duct dilatation. Pancreas: Unremarkable. No pancreatic ductal dilatation or surrounding inflammatory changes. Spleen: Normal in size without focal abnormality. Adrenals/Urinary Tract: No adrenal gland nodules. Kidneys are symmetrical. Moderate left hydronephrosis and hydroureter. Hydroureter extends down to the level of the bladder. Unfortunately due to streak artifact from hip arthroplasties, the distal ureter and bladder are not well visualized. Changes could represent obstruction due to a nonvisualized distal ureteral stone or this could be due to reflux disease. The bladder is decompressed with a Foley catheter. Stomach/Bowel: Stomach, small bowel, and colon are not abnormally distended. No wall thickening or inflammatory stranding. Appendix is not identified. Vascular/Lymphatic: Aortic atherosclerosis. No enlarged abdominal or pelvic lymph nodes. Reproductive: Uterus and bilateral adnexa are unremarkable. Other: Small amount of free fluid in the pelvis is nonspecific, probably reactive. Broad-based ventral abdominal wall hernia at the umbilicus containing fat and transverse colon without proximal obstruction. Musculoskeletal: Degenerative changes in the spine. Mild anterior subluxation of L4 on L5 is likely degenerative. Bilateral total hip arthroplasties. IMPRESSION: 1. Left renal hydronephrosis and hydroureter to the level of  the bladder. Distal ureter and bladder are not well visualized due to streak artifact. Changes may indicate obstruction due to a nonvisualized distal ureteral stone or reflux. 2. No evidence of bowel obstruction or  inflammation. 3. Mild aortic atherosclerosis. 4. Infiltration or atelectasis seen in both lung bases. 5. Small esophageal hiatal hernia. Broad-based ventral abdominal wall hernia containing colon but without proximal obstruction. Electronically Signed   By: Elsie Gravely M.D.   On: 01/02/2024 16:09   DG Chest 2 View Result Date: 01/02/2024 CLINICAL DATA:  Shortness of breath.  AMS. EXAM: CHEST - 2 VIEW COMPARISON:  12/06/2023. FINDINGS: The heart size and mediastinal contours are unchanged. Aortic atherosclerosis. No overt edema, focal consolidation, pleural effusion, or pneumothorax. No acute osseous abnormality. IMPRESSION: 1. No acute cardiopulmonary findings. 2.  Aortic Atherosclerosis (ICD10-I70.0). Electronically Signed   By: Harrietta Sherry M.D.   On: 01/02/2024 13:23      LOS: 2 days   Ole Bourdon, NP [facility] Pager: [phone]  01/04/2024, 11:43 AM

## 2024-01-04 NOTE — TOC Initial Note (Signed)
 Transition of Care [facility]) - Initial/Assessment Note   Patient Details  Name: Alicia Brennan MRN: [id number] Date of Birth: 20-Dec-1950  [facility]) CM/SW Contact:    Duwaine GORMAN Aran, LCSW Phone Number: 01/04/2024, 2:39 PM  Clinical Narrative: Care management consulted as family would like the patient not to return to [facility] at discharge. Per chart review, patient discharged to the facility on 12/11/23 for rehab. PT consulted. Care management awaiting PT recommendations.  Expected Discharge Plan:  (TBD) Barriers to Discharge: Continued Medical Work up  Expected Discharge Plan and Services In-house Referral: Clinical Social Work               Prior Living Arrangements/Services Patient language and need for interpreter reviewed:: Yes Need for Family Participation in Patient Care: No (Comment) Care giver support system in place?: Yes (comment) Criminal Activity/Legal Involvement Pertinent to Current Situation/Hospitalization: No - Comment as needed  Activities of Daily Living ADL Screening (condition at time of admission) Independently performs ADLs?: Yes (appropriate for developmental age) Is the patient deaf or have difficulty hearing?: No Does the patient have difficulty seeing, even when wearing glasses/contacts?: No Does the patient have difficulty concentrating, remembering, or making decisions?: Yes  Emotional Assessment Orientation: : Oriented to Self, Oriented to Place, Oriented to  Time, Oriented to Situation Alcohol / Substance Use: Not Applicable Psych Involvement: No (comment)  Admission diagnosis:  Seizures (HCC) [R56.9] Lethargy [R53.83] Breakthrough seizure (HCC) [G40.919] Hydronephrosis, unspecified hydronephrosis type [N13.30] Patient Active Problem List   Diagnosis Date Noted   Breakthrough seizure (HCC) 01/02/2024   Complicated UTI (urinary tract infection) 01/02/2024   Hydronephrosis 01/02/2024   AKI (acute kidney injury) 01/02/2024    Acute encephalopathy 12/07/2023   Encephalopathy 12/06/2023   Seizure (HCC) 09/12/2023   Osteoarthritis of both hips 11/15/2015   Avascular necrosis of bones of both hips (HCC) 11/15/2015   Solitary pulmonary nodule 05/03/2015   Localization-related (focal) (partial) idiopathic epilepsy and epileptic syndromes with seizures of localized onset, not intractable, without status epilepticus (HCC) 04/27/2015   Generalized tonic-clonic seizure (HCC) 04/25/2015   Cancer (HCC)    Sarcoidosis of lung    Hypertension    Recurrent headache    PCP:  [name], Edra GRADE, MD Pharmacy:  No Pharmacies Listed  Social Drivers of Health (SDOH) Social History: SDOH Screenings   Food Insecurity: No Food Insecurity (01/04/2024)  Housing: Unknown (01/04/2024)  Transportation Needs: No Transportation Needs (01/04/2024)  Utilities: Not At Risk (01/04/2024)  Financial Resource Strain: Low Risk  (11/16/2023)   Received from [facility]  Social Connections: Unknown (01/04/2024)  Tobacco Use: Low Risk  (01/03/2024)   SDOH Interventions:    Readmission Risk Interventions     No data to display

## 2024-01-05 DIAGNOSIS — G40919 Epilepsy, unspecified, intractable, without status epilepticus: Secondary | ICD-10-CM | POA: Diagnosis not present

## 2024-01-05 DIAGNOSIS — N390 Urinary tract infection, site not specified: Secondary | ICD-10-CM | POA: Diagnosis not present

## 2024-01-05 DIAGNOSIS — N1330 Unspecified hydronephrosis: Secondary | ICD-10-CM | POA: Diagnosis not present

## 2024-01-05 DIAGNOSIS — N179 Acute kidney failure, unspecified: Secondary | ICD-10-CM | POA: Diagnosis not present

## 2024-01-05 LAB — BASIC METABOLIC PANEL WITH GFR
Anion gap: 8 (ref 5–15)
BUN: 24 mg/dL — ABNORMAL HIGH (ref 8–23)
CO2: 22 mmol/L (ref 22–32)
Calcium: 8.4 mg/dL — ABNORMAL LOW (ref 8.9–10.3)
Chloride: 105 mmol/L (ref 98–111)
Creatinine, Ser: 0.85 mg/dL (ref 0.44–1.00)
GFR, Estimated: >60 mL/min (ref >60)
Glucose, Bld: 98 mg/dL (ref 70–99)
Potassium: 3.5 mmol/L (ref 3.5–5.1)
Sodium: 135 mmol/L (ref 135–145)

## 2024-01-05 NOTE — Progress Notes (Signed)
  Progress Note   Patient: Alicia Brennan [name] DOB: [birth year] DOA: 01/02/2024     3 DOS: the patient was seen and examined on 01/05/2024   Brief hospital course: 72 year old woman PMH including vascular dementia, seizure disorder, presented from SNF for altered mental status, generalized weakness and possible seizure, abdominal pain nausea and vomiting, concern for not taking medications.  Recent COVID diagnosis.  Also found to have hydronephrosis and urology consulted.  Admitted for breakthrough seizure, complicated UTI, left renal hydronephrosis and hydroureter, AKI.  Consultants Urology   Procedures/Events 10/8 admission 10/9 cystoscopy, left ureteral stent placement  Assessment and Plan: Breakthrough seizure Acute encephalopathy secondary to above In context of nausea and vomiting, inability to tolerate medications.  CT head and MRI brain no acute abnormalities. EEG was unrevealing Neurology recommended resuming antiepileptics. Tolerating oral medications divalproex , Vimpat , Lamictal , Keppra    Complicated E. coli UTI Left renal hydronephrosis, hydroureter Seen on CT Seen by urology, underwent stent placement, will follow-up in 2 weeks to reassess urinalysis and plan ureteroscopically Narrow antibiotics to oral therapy, treat 7 days.   AKI -- resolved Multifactorial, dehydration, obstruction Holding Lasix , telmisartan   Abnormal EKG Elevated troponin Hypertensive urgency EKG nonspecific.  No signs or symptoms to suggest ACS.  Troponin elevation trivial.  No further evaluation suggested.  Amlodipine  was stopped last hospitalization for lower extremity edema.  Currently holding Lasix  and telmisartan. Blood pressure stable.   Vascular dementia      Subjective:  Feels ok Doesn't like the food  Physical Exam: Vitals:   01/05/24 0945 01/05/24 1327 01/05/24 1328 01/05/24 1505  BP:  (!) 175/66 (!) 175/66 (!) 145/61  Pulse:   62 72  Resp: 12   14  Temp:     98.7 F (37.1 C)  TempSrc:    Oral  SpO2:    99%  Weight:      Height:       Physical Exam Vitals reviewed.  Constitutional:      General: She is not in acute distress.    Appearance: She is not ill-appearing or toxic-appearing.  Cardiovascular:     Rate and Rhythm: Normal rate and regular rhythm.     Heart sounds: No murmur heard. Pulmonary:     Effort: Pulmonary effort is normal. No respiratory distress.     Breath sounds: No wheezing, rhonchi or rales.  Neurological:     Mental Status: She is alert.  Psychiatric:        Mood and Affect: Mood normal.        Behavior: Behavior normal.     Data Reviewed: Basic metabolic panel unremarkable  Family Communication: daughter at beside  Disposition: Status is: Inpatient Remains inpatient appropriate because: await SNF     Time spent: 20 minutes  Author: Toribio Door, MD 01/05/2024 4:19 PM  For on call review [email or web address].

## 2024-01-05 NOTE — Evaluation (Signed)
 Physical Therapy Evaluation Patient Details Name: Alicia Brennan MRN: [id number] DOB: [birth year] Today's Date: 01/05/2024  History of Present Illness  73 year old woman presented from SNF rehab for altered mental status, generalized weakness and possible seizure, abdominal pain nausea and vomiting, concern for not taking medications.  Recent COVID diagnosis.  Also found to have hydronephrosis and urology consulted.  Admitted on 01/02/24 for breakthrough seizure, complicated UTI, left renal hydronephrosis and hydroureter, AKI.   PMH including vascular dementia, seizure disorder, pulmonary sacoidosis, IBS, HTN  Clinical Impression  Pt admitted with above diagnosis.  Pt currently with functional limitations due to the deficits listed below (see PT Problem List). Pt will benefit from acute skilled PT to increase their independence and safety with mobility to allow discharge.  Pt and daughter report pt was at [facility] for rehab prior to this admission however pt had not been OOB for approx 2 weeks.  Pt requiring significant +2 assist for bed mobility at this time.  Recommend pt return to SNF to complete rehab prior to home.  Daughter prefers pt not return to same facility.         If plan is discharge home, recommend the following: Two people to help with walking and/or transfers;A lot of help with bathing/dressing/bathroom   Can travel by private vehicle   No    Equipment Recommendations None recommended by PT  Recommendations for Other Services       Functional Status Assessment Patient has had a recent decline in their functional status and demonstrates the ability to make significant improvements in function in a reasonable and predictable amount of time.     Precautions / Restrictions Precautions Precautions: Fall      Mobility  Bed Mobility Overal bed mobility: Needs Assistance Bed Mobility: Supine to Sit, Sit to Supine     Supine to sit: Max assist, HOB elevated, Used rails Sit to  supine: Total assist, +2 for physical assistance   General bed mobility comments: required assist for upper and lower body, pt initiating however physically requiring assist to complete    Transfers                   General transfer comment: NT- would need significant +2 or lift (pt has not been OOB in at least 2 weeks per her report)    Ambulation/Gait                  Stairs            Wheelchair Mobility     Tilt Bed    Modified Rankin (Stroke Patients Only)       Balance Overall balance assessment: Needs assistance Sitting-balance support: Bilateral upper extremity supported, Feet supported Sitting balance-Leahy Scale: Zero Sitting balance - Comments: reliant on UE assist and external support, briefly able to hold without external support but heavily reliant on BIL UE assist Postural control: Posterior lean                                   Pertinent Vitals/Pain Pain Assessment Pain Assessment: No/denies pain    Home Living Family/patient expects to be discharged to:: Skilled nursing facility                        Prior Function Prior Level of Function : Needs assist  Mobility Comments: daughter present and reports pt was mostly transferring prior to last admission, able to ambulate with assistive device however would take 30 mins to get to bathroom; pt and daughter report pt was not getting OOB at SNF for about 2 weeks prior to this admission       Extremity/Trunk Assessment   Upper Extremity Assessment Upper Extremity Assessment: Generalized weakness    Lower Extremity Assessment Lower Extremity Assessment: Generalized weakness       Communication   Communication Communication: No apparent difficulties    Cognition Arousal: Alert Behavior During Therapy: Flat affect   PT - Cognitive impairments: Problem solving, Safety/Judgement                       PT - Cognition  Comments: slow to process Following commands: Intact       Cueing Cueing Techniques: Tactile cues, Gestural cues, Verbal cues, Visual cues     General Comments      Exercises General Exercises - Lower Extremity Ankle Circles/Pumps: AROM, Both, 10 reps, Seated Long Arc Quad: AROM, Both, 10 reps, Seated   Assessment/Plan    PT Assessment Patient needs continued PT services  PT Problem List Decreased strength;Decreased activity tolerance;Decreased balance;Decreased mobility;Decreased knowledge of use of DME       PT Treatment Interventions DME instruction;Gait training;Balance training;Neuromuscular re-education;Functional mobility training;Patient/family education;Wheelchair mobility training;Therapeutic activities;Therapeutic exercise    PT Goals (Current goals can be found in the Care Plan section)  Acute Rehab PT Goals PT Goal Formulation: With patient/family Time For Goal Achievement: 01/19/24 Potential to Achieve Goals: Good    Frequency Min 2X/week     Co-evaluation               AM-PAC PT 6 Clicks Mobility  Outcome Measure Help needed turning from your back to your side while in a flat bed without using bedrails?: A Lot Help needed moving from lying on your back to sitting on the side of a flat bed without using bedrails?: A Lot Help needed moving to and from a bed to a chair (including a wheelchair)?: Total Help needed standing up from a chair using your arms (e.g., wheelchair or bedside chair)?: Total Help needed to walk in hospital room?: Total Help needed climbing 3-5 steps with a railing? : Total 6 Click Score: 8    End of Session Equipment Utilized During Treatment: Gait belt Activity Tolerance: Patient tolerated treatment well Patient left: in bed;with call bell/phone within reach;with bed alarm set;with family/visitor present Nurse Communication: Mobility status PT Visit Diagnosis: Muscle weakness (generalized) (M62.81);Other abnormalities of  gait and mobility (R26.89)    Time: 8874-8852 PT Time Calculation (min) (ACUTE ONLY): 22 min   Charges:   PT Evaluation $PT Eval Low Complexity: 1 Low   PT General Charges $$ ACUTE PT VISIT: 1 Visit        Tari PT, DPT Physical Therapist Acute Rehabilitation Services Office: [phone]   Kati L Payson 01/05/2024, 1:33 PM

## 2024-01-06 DIAGNOSIS — N390 Urinary tract infection, site not specified: Secondary | ICD-10-CM | POA: Diagnosis not present

## 2024-01-06 DIAGNOSIS — N179 Acute kidney failure, unspecified: Secondary | ICD-10-CM | POA: Diagnosis not present

## 2024-01-06 DIAGNOSIS — N1330 Unspecified hydronephrosis: Secondary | ICD-10-CM | POA: Diagnosis not present

## 2024-01-06 DIAGNOSIS — G40919 Epilepsy, unspecified, intractable, without status epilepticus: Secondary | ICD-10-CM | POA: Diagnosis not present

## 2024-01-06 MED ORDER — SENNA 8.6 MG PO TABS
1.0000 | ORAL_TABLET | Freq: Every day | ORAL | Status: DC
  Administered 2024-01-06 – 2024-01-07 (×2): 8.6 mg via ORAL
  Filled 2024-01-06 (×2): qty 1

## 2024-01-06 MED ORDER — ENSURE PLUS HIGH PROTEIN PO LIQD
237.0000 mL | Freq: Two times a day (BID) | ORAL | Status: DC
  Administered 2024-01-06 – 2024-01-10 (×10): 237 mL via ORAL

## 2024-01-06 MED ORDER — POLYETHYLENE GLYCOL 3350 17 G PO PACK
17.0000 g | PACK | Freq: Two times a day (BID) | ORAL | Status: DC
  Administered 2024-01-06 – 2024-01-10 (×5): 17 g via ORAL
  Filled 2024-01-06 (×5): qty 1

## 2024-01-06 MED ORDER — BISACODYL 10 MG RE SUPP: 10.0000 mg | Freq: Every day | RECTAL | Status: DC | PRN

## 2024-01-06 NOTE — Progress Notes (Signed)
  Progress Note   Patient: Alicia Brennan [name] DOB: [birth year] DOA: 01/02/2024     4 DOS: the patient was seen and examined on 01/06/2024   Brief hospital course: 73 year old woman PMH including vascular dementia, seizure disorder, presented from SNF for altered mental status, generalized weakness and possible seizure, abdominal pain nausea and vomiting, concern for not taking medications.  Recent COVID diagnosis.  Also found to have hydronephrosis and urology consulted.  Admitted for breakthrough seizure, complicated UTI, left renal hydronephrosis and hydroureter, AKI.  Consultants Urology   Procedures/Events 10/8 admission 10/9 cystoscopy, left ureteral stent placement  Assessment and Plan: Breakthrough seizure Acute encephalopathy secondary to above In context of nausea and vomiting, inability to tolerate medications.  CT head and MRI brain no acute abnormalities. EEG was unrevealing Neurology recommended resuming antiepileptics. Tolerating oral medications divalproex , Vimpat , Lamictal , Keppra    Complicated E. coli UTI Left renal hydronephrosis, hydroureter Seen on CT Seen by urology, underwent stent placement, will follow-up in 2 weeks to reassess urinalysis and plan ureteroscopically Narrow antibiotics to oral therapy, treat 7 days.   AKI -- resolved Multifactorial, dehydration, obstruction Can resume furosemide , telmisartan tomorrow   Abnormal EKG Elevated troponin Hypertensive urgency EKG nonspecific.  No signs or symptoms to suggest ACS.  Troponin elevation trivial.  No further evaluation suggested.  Amlodipine  was stopped last hospitalization for lower extremity edema.  Currently holding Lasix  and telmisartan. Blood pressure stable.   Vascular dementia      Subjective:  Feels fine  Physical Exam: Vitals:   01/05/24 1328 01/05/24 1505 01/05/24 2143 01/06/24 0526  BP: (!) 175/66 (!) 145/61 (!) 151/76 (!) 149/71  Pulse: 62 72 (!) 58 63  Resp:  14 18  18   Temp:  98.7 F (37.1 C) 97.9 F (36.6 C) 98 F (36.7 C)  TempSrc:  Oral Oral Oral  SpO2:  99% 96% 95%  Weight:      Height:       Physical Exam Vitals reviewed.  Constitutional:      General: She is not in acute distress.    Appearance: She is not ill-appearing or toxic-appearing.  Cardiovascular:     Rate and Rhythm: Normal rate and regular rhythm.     Heart sounds: No murmur heard. Pulmonary:     Effort: Pulmonary effort is normal. No respiratory distress.     Breath sounds: No wheezing, rhonchi or rales.  Neurological:     Mental Status: She is alert.  Psychiatric:        Mood and Affect: Mood normal.        Behavior: Behavior normal.     Data Reviewed: No new data  Family Communication: none  Disposition: Status is: Inpatient Remains inpatient appropriate because: await SNF     Time spent: 20 minutes  Author: Toribio Door, MD 01/06/2024 4:02 PM  For on call review [email or web address].

## 2024-01-06 NOTE — Plan of Care (Signed)

## 2024-01-07 DIAGNOSIS — N1330 Unspecified hydronephrosis: Secondary | ICD-10-CM | POA: Diagnosis not present

## 2024-01-07 DIAGNOSIS — G40919 Epilepsy, unspecified, intractable, without status epilepticus: Secondary | ICD-10-CM | POA: Diagnosis not present

## 2024-01-07 DIAGNOSIS — N179 Acute kidney failure, unspecified: Secondary | ICD-10-CM | POA: Diagnosis not present

## 2024-01-07 DIAGNOSIS — N390 Urinary tract infection, site not specified: Secondary | ICD-10-CM | POA: Diagnosis not present

## 2024-01-07 LAB — CULTURE, BLOOD (ROUTINE X 2)
Culture: NO GROWTH
Special Requests: ADEQUATE

## 2024-01-07 NOTE — TOC Progression Note (Addendum)
 Transition of Care [facility]) - Progression Note   Patient Details  Name: Alicia Brennan MRN: [id number] Date of Birth: 21-Mar-1951  Transition [facility]) CM/SW Contact  Duwaine GORMAN Aran, LCSW Phone Number: 01/07/2024, 10:18 AM  Clinical Narrative: PT evaluation recommended SNF. Daughter agreeable to SNF and requested [facility] as first choice. Family does not want patient to return to [facility]. Initial note done; PASRR confirmed. Initial referral faxed out. Care management awaiting bed offers.  Addendum: 2:32pm-Daughter requested that referral also be sent to [facility] and [facility].  Expected Discharge Plan: Skilled Nursing Facility Barriers to Discharge: Continued Medical Work up, English as a second language teacher, SNF Pending bed offer  Expected Discharge Plan and Services In-house Referral: Clinical Social Work Post Acute Care Choice: Skilled Nursing Facility           DME Arranged: N/A DME Agency: NA  Social Drivers of Health (SDOH) Interventions SDOH Screenings   Food Insecurity: No Food Insecurity (01/04/2024)  Housing: Unknown (01/04/2024)  Transportation Needs: No Transportation Needs (01/04/2024)  Utilities: Not At Risk (01/04/2024)  Financial Resource Strain: Low Risk  (11/16/2023)   Received from [facility]  Social Connections: Unknown (01/04/2024)  Tobacco Use: Low Risk  (01/03/2024)   Readmission Risk Interventions     No data to display

## 2024-01-07 NOTE — NC FL2 (Signed)
 [place]  MEDICAID FL2 LEVEL OF CARE FORM     IDENTIFICATION  Patient Name: Alicia Brennan Birthdate: June 06, 1950 Sex: female Admission Date (Current Location): 01/02/2024  [facility] and [place] Number:  Producer, television/film/video and Address:  [facility],  [address], [place] [address]      Provider Number: [id number]  Attending Physician Name and Address:  Jadine Toribio SQUIBB, MD  Relative Name and Phone Number:  Nashea Chumney (daughter) Ph: [phone]    Current Level of Care: Hospital Recommended Level of Care: Skilled Nursing Facility Prior Approval Number:    Date Approved/Denied:   PASRR Number: [id number]  Discharge Plan: SNF    Current Diagnoses: Patient Active Problem List   Diagnosis Date Noted   Breakthrough seizure (HCC) 01/02/2024   Complicated UTI (urinary tract infection) 01/02/2024   Hydronephrosis 01/02/2024   AKI (acute kidney injury) 01/02/2024   Acute encephalopathy 12/07/2023   Encephalopathy 12/06/2023   Seizure (HCC) 09/12/2023   Osteoarthritis of both hips 11/15/2015   Avascular necrosis of bones of both hips (HCC) 11/15/2015   Solitary pulmonary nodule 05/03/2015   Localization-related (focal) (partial) idiopathic epilepsy and epileptic syndromes with seizures of localized onset, not intractable, without status epilepticus (HCC) 04/27/2015   Generalized tonic-clonic seizure (HCC) 04/25/2015   Cancer (HCC)    Sarcoidosis of lung    Hypertension    Recurrent headache     Orientation RESPIRATION BLADDER Height & Weight     Self, Situation, Place  Normal Continent Weight: 185 lb (83.9 kg) Height:  5' 5 (165.1 cm)  BEHAVIORAL SYMPTOMS/MOOD NEUROLOGICAL BOWEL NUTRITION STATUS      Continent Diet (Regular diet)  AMBULATORY STATUS COMMUNICATION OF NEEDS Skin   Extensive Assist Verbally Other (Comment) (Erythema: buttocks)                       Personal Care Assistance Level of Assistance  Bathing, Feeding, Dressing  Bathing Assistance: Maximum assistance Feeding assistance: Limited assistance Dressing Assistance: Maximum assistance     Functional Limitations Info  Sight, Hearing, Speech Sight Info: Adequate Hearing Info: Adequate Speech Info: Adequate    SPECIAL CARE FACTORS FREQUENCY  PT (By licensed PT), OT (By licensed OT)     PT Frequency: 5x's/week OT Frequency: 5x's/week            Contractures Contractures Info: Not present    Additional Factors Info  Code Status, Allergies Code Status Info: Full Allergies Info: NKA           Current Medications (01/07/2024):  This is the current hospital active medication list Current Facility-Administered Medications  Medication Dose Route Frequency Provider Last Rate Last Admin   acetaminophen  (TYLENOL ) tablet 650 mg  650 mg Oral Q6H PRN Devere Lonni Righter, MD       Or   acetaminophen  (TYLENOL ) suppository 650 mg  650 mg Rectal Q6H PRN Devere Lonni Righter, MD       bisacodyl (DULCOLAX) suppository 10 mg  10 mg Rectal Daily PRN Jadine Toribio SQUIBB, MD       cefadroxil (DURICEF) capsule 500 mg  500 mg Oral BID Jadine Toribio SQUIBB, MD   500 mg at 01/07/24 9053   Chlorhexidine  Gluconate Cloth 2 % PADS 6 each  6 each Topical Daily [name], MD   6 each at 01/06/24 1427   divalproex  (DEPAKOTE ) DR tablet 250 mg  250 mg Oral Q8H Jadine Toribio SQUIBB, MD   250 mg at 01/07/24 [phone]  feeding supplement (ENSURE PLUS HIGH PROTEIN) liquid 237 mL  237 mL Oral BID BM Jadine Toribio SQUIBB, MD   237 mL at 01/07/24 9051   hydrALAZINE  (APRESOLINE ) injection 5 mg  5 mg Intravenous Q4H PRN Devere Lonni Righter, MD       hydrALAZINE  (APRESOLINE ) tablet 25 mg  25 mg Oral Q8H Devere Lonni Righter, MD   25 mg at 01/07/24 0549   lacosamide  (VIMPAT ) tablet 50 mg  50 mg Oral BID Jadine Toribio SQUIBB, MD   50 mg at 01/07/24 9053   lamoTRIgine  (LAMICTAL ) tablet 25 mg  25 mg Oral BID Devere Lonni Righter, MD   25 mg at 01/07/24 9046    levETIRAcetam  (KEPPRA ) tablet 500 mg  500 mg Oral BID Jadine Toribio SQUIBB, MD   500 mg at 01/07/24 9053   polyethylene glycol (MIRALAX  / GLYCOLAX ) packet 17 g  17 g Oral BID Jadine Toribio SQUIBB, MD   17 g at 01/07/24 9053   senna (SENOKOT) tablet 8.6 mg  1 tablet Oral QHS Jadine Toribio SQUIBB, MD   8.6 mg at 01/06/24 2110     Discharge Medications: Please see discharge summary for a list of discharge medications.  Relevant Imaging Results:  Relevant Lab Results:   Additional Information SSN: [id number]  Duwaine GORMAN Aran, LCSW

## 2024-01-07 NOTE — Progress Notes (Signed)
  Progress Note   Patient: Alicia Brennan [name] DOB: 10/16/1950 DOA: 01/02/2024     5 DOS: the patient was seen and examined on 01/07/2024   Brief hospital course: 73 year old woman PMH including vascular dementia, seizure disorder, presented from SNF for altered mental status, generalized weakness and possible seizure, abdominal pain nausea and vomiting, concern for not taking medications.  Recent COVID diagnosis.  Also found to have hydronephrosis and urology consulted.  Admitted for breakthrough seizure, complicated UTI, left renal hydronephrosis and hydroureter, AKI.  Condition stabilized, no further seizures.  Foley catheter will remain in place and she will follow-up with urology as an outpatient.  Plan for SNF.  Consultants Urology   Procedures/Events 10/8 admission 10/9 cystoscopy, left ureteral stent placement  Assessment and Plan: Breakthrough seizure Acute encephalopathy secondary to above In context of nausea and vomiting, inability to tolerate medications.  CT head and MRI brain no acute abnormalities. EEG was unrevealing Neurology recommended resuming antiepileptics. Tolerating oral medications divalproex , Vimpat , Lamictal , Keppra    Complicated E. coli UTI Left renal hydronephrosis, hydroureter Seen on CT Seen by urology, underwent stent placement, will follow-up in 2 weeks to reassess urinalysis and plan ureteroscopically.  Per urology patient is catheter dependent, will follow-up with urology as an outpatient. Narrowed antibiotics to oral therapy, treat 7 days.   AKI -- resolved Multifactorial, dehydration, obstruction Can resume furosemide , telmisartan tomorrow   Abnormal EKG Elevated troponin Hypertensive urgency EKG nonspecific.  No signs or symptoms to suggest ACS.  Troponin elevation trivial.  No further evaluation suggested.  Amlodipine  was stopped last hospitalization for lower extremity edema.  Currently holding Lasix  and telmisartan. Blood pressure  stable.   Vascular dementia  Stable for transfer to SNF.    Subjective:  Feels ok  Physical Exam: Vitals:   01/06/24 1952 01/06/24 2339 01/07/24 0536 01/07/24 1314  BP: (!) 176/75 (!) 160/69 (!) 145/69 138/73  Pulse: 65 70 67 65  Resp:    16  Temp: 99.1 F (37.3 C)  98.6 F (37 C) 98.1 F (36.7 C)  TempSrc: Oral  Oral Oral  SpO2: 100%  96% 98%  Weight:      Height:       Physical Exam Vitals reviewed.  Constitutional:      General: She is not in acute distress.    Appearance: She is not ill-appearing or toxic-appearing.  Cardiovascular:     Rate and Rhythm: Normal rate and regular rhythm.     Heart sounds: No murmur heard. Pulmonary:     Effort: Pulmonary effort is normal. No respiratory distress.     Breath sounds: No wheezing, rhonchi or rales.  Neurological:     Mental Status: She is alert.  Psychiatric:        Mood and Affect: Mood normal.        Behavior: Behavior normal.     Data Reviewed: No new data  Family Communication: none  Disposition: Status is: Inpatient Remains inpatient appropriate because: await SNF     Time spent: 20 condition stabilized, minutes  Author: Toribio Door, MD 01/07/2024 4:58 PM  For on call review [email or web address].

## 2024-01-07 NOTE — Plan of Care (Incomplete)
  Problem: Education: Goal: Knowledge of General Education information will improve Description: Including pain rating scale, medication(s)/side effects and non-pharmacologic comfort measures Outcome: Progressing   Problem: Health Behavior/Discharge Planning: Goal: Ability to manage health-related needs will improve Outcome: Progressing   Problem: Clinical Measurements: Goal: Will remain free from infection Outcome: Progressing Goal: Diagnostic test results will improve Outcome: Progressing   Problem: Activity: Goal: Risk for activity intolerance will decrease Outcome: Progressing   Problem: Nutrition: Goal: Adequate nutrition will be maintained Outcome: Progressing   Problem: Elimination: Goal: Will not experience complications related to bowel motility Outcome: Progressing Goal: Will not experience complications related to urinary retention Outcome: Progressing   Problem: Safety: Goal: Ability to remain free from injury will improve Outcome: Progressing   Problem: Clinical Measurements: Goal: Respiratory complications will improve Outcome: Adequate for Discharge Goal: Cardiovascular complication will be avoided Outcome: Adequate for Discharge   Problem: Coping: Goal: Level of anxiety will decrease Outcome: Adequate for Discharge   Problem: Pain Managment: Goal: General experience of comfort will improve and/or be controlled Outcome: Adequate for Discharge   Problem: Skin Integrity: Goal: Risk for impaired skin integrity will decrease Outcome: Adequate for Discharge

## 2024-01-08 DIAGNOSIS — G40919 Epilepsy, unspecified, intractable, without status epilepticus: Secondary | ICD-10-CM | POA: Diagnosis not present

## 2024-01-08 DIAGNOSIS — N179 Acute kidney failure, unspecified: Secondary | ICD-10-CM | POA: Diagnosis not present

## 2024-01-08 DIAGNOSIS — N390 Urinary tract infection, site not specified: Secondary | ICD-10-CM | POA: Diagnosis not present

## 2024-01-08 DIAGNOSIS — N1330 Unspecified hydronephrosis: Secondary | ICD-10-CM | POA: Diagnosis not present

## 2024-01-08 LAB — CULTURE, BLOOD (ROUTINE X 2)
Culture: NO GROWTH
Special Requests: ADEQUATE

## 2024-01-08 MED ORDER — IRBESARTAN 150 MG PO TABS
150.0000 mg | ORAL_TABLET | Freq: Every day | ORAL | Status: DC
  Administered 2024-01-08 – 2024-01-10 (×3): 150 mg via ORAL
  Filled 2024-01-08 (×3): qty 1

## 2024-01-08 MED ORDER — FUROSEMIDE 40 MG PO TABS
40.0000 mg | ORAL_TABLET | Freq: Every day | ORAL | Status: DC
  Administered 2024-01-08 – 2024-01-10 (×3): 40 mg via ORAL
  Filled 2024-01-08 (×3): qty 1

## 2024-01-08 NOTE — TOC Progression Note (Signed)
 Transition of Care [facility]) - Progression Note   Patient Details  Name: Alicia Brennan MRN: [id number] Date of Birth: 1950/10/11  [facility]) CM/SW Contact  Duwaine GORMAN Aran, LCSW Phone Number: 01/08/2024, 2:40 PM  Clinical Narrative: Patient's only bed offer is [facility]. Patient is in copay days and does not have a payor source to cover the copay ($203/day). CSW explained to daughter that family/patient would need to confirm the copay would be covered or a facility will not be able to take the patient for rehab. Daughter reported she won the appeal for continued stay at rehab prior to admission. CSW explained that since the patient has been admitted to the hospital, a new insurance authorization will need to be completed for rehab. Patient did not receive any bed offers from the daughter's requested facilities.  CSW spoke with daughter and patient's insurance agent to discuss patient's situation. CSW explained that since the patient has not had 60 days of wellness and was admitted to the hospital directly from SNF, patient's rehab bed days will not reset. CSW also explained that patient will need to have a confirmed payor source to cover the copay. Patient's insurance agent reported [facility] started the [facility] application, but never completed it and asked if a facility will take the patient since an application was started. CSW reiterated a payor source needs to be in place at admission to SNF as the patient is in copay days now.  CSW followed up with Levon at [facility] in admissions and she confirmed the patient has not met her out of pocket max, so the copay will be required for rehab.  Expected Discharge Plan: Skilled Nursing Facility Barriers to Discharge: Insurance Authorization, SNF Pending bed offer, Other (must enter comment) (Patient is in copay days for SNF.)  Expected Discharge Plan and Services In-house Referral: Clinical Social Work Post Acute Care Choice:  Skilled Nursing Facility           DME Arranged: N/A DME Agency: NA  Social Drivers of Health (SDOH) Interventions SDOH Screenings   Food Insecurity: No Food Insecurity (01/04/2024)  Housing: Unknown (01/04/2024)  Transportation Needs: No Transportation Needs (01/04/2024)  Utilities: Not At Risk (01/04/2024)  Financial Resource Strain: Low Risk  (11/16/2023)   Received from [facility]  Social Connections: Unknown (01/04/2024)  Tobacco Use: Low Risk  (01/03/2024)   Readmission Risk Interventions     No data to display

## 2024-01-08 NOTE — Plan of Care (Signed)
   Problem: Education: Goal: Knowledge of General Education information will improve Description Including pain rating scale, medication(s)/side effects and non-pharmacologic comfort measures Outcome: Progressing   Problem: Health Behavior/Discharge Planning: Goal: Ability to manage health-related needs will improve Outcome: Progressing

## 2024-01-08 NOTE — Plan of Care (Incomplete)
  Problem: Education: Goal: Knowledge of General Education information will improve Description: Including pain rating scale, medication(s)/side effects and non-pharmacologic comfort measures Outcome: Progressing   Problem: Health Behavior/Discharge Planning: Goal: Ability to manage health-related needs will improve Outcome: Progressing   Problem: Clinical Measurements: Goal: Ability to maintain clinical measurements within normal limits will improve Outcome: Progressing Goal: Will remain free from infection Outcome: Progressing Goal: Diagnostic test results will improve Outcome: Progressing   Problem: Activity: Goal: Risk for activity intolerance will decrease Outcome: Progressing   Problem: Elimination: Goal: Will not experience complications related to bowel motility Outcome: Progressing Goal: Will not experience complications related to urinary retention Outcome: Progressing   Problem: Safety: Goal: Ability to remain free from injury will improve Outcome: Progressing   Problem: Clinical Measurements: Goal: Respiratory complications will improve Outcome: Adequate for Discharge Goal: Cardiovascular complication will be avoided Outcome: Adequate for Discharge   Problem: Nutrition: Goal: Adequate nutrition will be maintained Outcome: Adequate for Discharge   Problem: Coping: Goal: Level of anxiety will decrease Outcome: Adequate for Discharge   Problem: Pain Managment: Goal: General experience of comfort will improve and/or be controlled Outcome: Adequate for Discharge   Problem: Skin Integrity: Goal: Risk for impaired skin integrity will decrease Outcome: Adequate for Discharge

## 2024-01-08 NOTE — Progress Notes (Signed)
 Mobility Specialist Progress Note:   01/08/24 1547  Mobility  Activity Mechanically lifted from chair to bed  Level of Assistance Total care  Assistive Device MaxiMove  Activity Response Tolerated well  Mobility visit 1 Mobility  Mobility Specialist Start Time (ACUTE ONLY) 1530  Mobility Specialist Stop Time (ACUTE ONLY) 1540  Mobility Specialist Time Calculation (min) (ACUTE ONLY) 10 min   RN requested assistance to get Pt back in bed. Returned to bed with all needs met. Call bell in reach.  [employer] - Mobility Specialist

## 2024-01-08 NOTE — Progress Notes (Signed)
  Progress Note   Patient: Alicia Brennan [name] DOB: 22-Jun-1950 DOA: 01/02/2024     6 DOS: the patient was seen and examined on 01/08/2024   Brief hospital course: 73 year old woman PMH including vascular dementia, seizure disorder, presented from SNF for altered mental status, generalized weakness and possible seizure, abdominal pain nausea and vomiting, concern for not taking medications.  Recent COVID diagnosis.  Also found to have hydronephrosis and urology consulted.  Admitted for breakthrough seizure, complicated UTI, left renal hydronephrosis and hydroureter, AKI.  Condition stabilized, no further seizures.  Foley catheter will remain in place and she will follow-up with urology as an outpatient.  Plan for SNF.  Consultants Urology   Procedures/Events 10/8 admission 10/9 cystoscopy, left ureteral stent placement  Assessment and Plan: Breakthrough seizure Acute encephalopathy secondary to above In context of nausea and vomiting, inability to tolerate medications.  CT head and MRI brain no acute abnormalities. EEG was unrevealing Neurology recommended resuming antiepileptics. Tolerating oral medications divalproex , Vimpat , Lamictal , Keppra    Complicated E. coli UTI Left renal hydronephrosis, hydroureter Seen on CT Seen by urology, underwent stent placement, will follow-up in 2 weeks to reassess urinalysis and plan ureteroscopically.  Per urology patient is catheter dependent, will follow-up with urology as an outpatient. Narrowed antibiotics to oral therapy.   AKI -- resolved Multifactorial, dehydration, obstruction Can resume furosemide , telmisartan    Abnormal EKG Elevated troponin Hypertensive urgency EKG nonspecific.  No signs or symptoms to suggest ACS.  Troponin elevation trivial.  No further evaluation suggested.  Amlodipine  was stopped last hospitalization for lower extremity edema.  Currently holding Lasix  and telmisartan. Blood pressure stable.   Vascular  dementia       Subjective:  Feels ok  Physical Exam: Vitals:   01/06/24 2339 01/07/24 0536 01/07/24 1314 01/08/24 0551  BP: (!) 160/69 (!) 145/69 138/73 124/64  Pulse: 70 67 65 65  Resp:   16 20  Temp:  98.6 F (37 C) 98.1 F (36.7 C) 97.7 F (36.5 C)  TempSrc:  Oral Oral Oral  SpO2:  96% 98% 94%  Weight:      Height:       Physical Exam Vitals reviewed.  Constitutional:      General: She is not in acute distress.    Appearance: She is not ill-appearing or toxic-appearing.  Cardiovascular:     Rate and Rhythm: Normal rate and regular rhythm.     Heart sounds: No murmur heard. Pulmonary:     Effort: Pulmonary effort is normal. No respiratory distress.     Breath sounds: No wheezing, rhonchi or rales.  Neurological:     Mental Status: She is alert.  Psychiatric:        Mood and Affect: Mood normal.     Data Reviewed: No new data  Family Communication: none  Disposition: Status is: Inpatient Remains inpatient appropriate because: await SNF     Time spent: 20 minutes  Author: Toribio Door, MD 01/08/2024 1:01 PM  For on call review [email or web address]

## 2024-01-08 NOTE — Progress Notes (Signed)
 Physical Therapy Treatment Patient Details Name: Alicia Brennan MRN: [id number] DOB: 1950-08-05 Today's Date: 01/08/2024   History of Present Illness 73 year old woman presented from SNF rehab for altered mental status, generalized weakness and possible seizure, abdominal pain nausea and vomiting, concern for not taking medications.  Recent COVID diagnosis.  Also found to have hydronephrosis and urology consulted.  Admitted on 01/02/24 for breakthrough seizure, complicated UTI, left renal hydronephrosis and hydroureter, AKI.   PMH including vascular dementia, seizure disorder, pulmonary sacoidosis, IBS, HTN    PT Comments  +2 mod assist for supine to sit. Pt able to sit at edge of bed for ~10 minutes with BLE and single UE support 2* posterior lean. +2 mod/max assist sit to stand from elevated bed with [facility] lift equipment. Pt transferred bed to recliner via Stedy. Pt performed seated BLE strengthening exercises.     If plan is discharge home, recommend the following: Two people to help with walking and/or transfers;A lot of help with bathing/dressing/bathroom;Assistance with cooking/housework;Direct supervision/assist for financial management;Direct supervision/assist for medications management;Help with stairs or ramp for entrance;Assist for transportation   Can travel by private vehicle     No  Equipment Recommendations  None recommended by PT    Recommendations for Other Services       Precautions / Restrictions Precautions Precautions: Fall Recall of Precautions/Restrictions: Impaired Restrictions Weight Bearing Restrictions Per Provider Order: No     Mobility  Bed Mobility Overal bed mobility: Needs Assistance Bed Mobility: Supine to Sit     Supine to sit: Mod assist, HOB elevated, Used rails, +2 for physical assistance     General bed mobility comments: assist to advance RLE, raise trunk and pivot hips; pt able to advance LLE    Transfers Overall transfer level: Needs  assistance   Transfers: Sit to/from Stand, Bed to chair/wheelchair/BSC Sit to Stand: Via lift equipment, Mod assist, +2 physical assistance, From elevated surface, Max assist           General transfer comment: +2 mod/max assist to power up from elevated bed; transferred bed to recliner via Acupuncturist via Lift Equipment: Stedy  Ambulation/Gait                   Stairs             Wheelchair Mobility     Tilt Bed    Modified Rankin (Stroke Patients Only)       Balance   Sitting-balance support: Single extremity supported, Feet supported Sitting balance-Leahy Scale: Poor Sitting balance - Comments: required single UE and BLE support 2* posterior lean; pt sat EOB ~10 minutes Postural control: Posterior lean Standing balance support: Reliant on assistive device for balance, Bilateral upper extremity supported, During functional activity Standing balance-Leahy Scale: Poor                              Communication Communication Communication: No apparent difficulties  Cognition Arousal: Alert Behavior During Therapy: WFL for tasks assessed/performed   PT - Cognitive impairments: Problem solving, Safety/Judgement, No family/caregiver present to determine baseline, History of cognitive impairments, Memory                       PT - Cognition Comments: slow to process, oriented to self and to year but not to month Following commands: Intact      Cueing Cueing Techniques: Tactile cues, Gestural cues, Verbal cues, Visual cues  Exercises General Exercises - Lower Extremity Ankle Circles/Pumps: AROM, Both, Supine, 15 reps Long Arc Quad: AROM, Both, Seated, 15 reps    General Comments        Pertinent Vitals/Pain Pain Assessment Pain Assessment: No/denies pain    Home Living                          Prior Function            PT Goals (current goals can now be found in the care plan section) Acute Rehab PT  Goals PT Goal Formulation: With patient/family Time For Goal Achievement: 01/19/24 Potential to Achieve Goals: Good Progress towards PT goals: Progressing toward goals    Frequency    Min 2X/week      PT Plan      Co-evaluation              AM-PAC PT 6 Clicks Mobility   Outcome Measure  Help needed turning from your back to your side while in a flat bed without using bedrails?: A Lot Help needed moving from lying on your back to sitting on the side of a flat bed without using bedrails?: A Lot Help needed moving to and from a bed to a chair (including a wheelchair)?: Total   Help needed to walk in hospital room?: Total Help needed climbing 3-5 steps with a railing? : Total 6 Click Score: 7    End of Session   Activity Tolerance: Patient tolerated treatment well Patient left: in chair;with chair alarm set;with call bell/phone within reach Nurse Communication: Mobility status;Need for lift equipment (lift sling left under pt in recliner) PT Visit Diagnosis: Muscle weakness (generalized) (M62.81);Other abnormalities of gait and mobility (R26.89);Difficulty in walking, not elsewhere classified (R26.2)     Time: 1206-1226 PT Time Calculation (min) (ACUTE ONLY): 20 min  Charges:    $Therapeutic Activity: 8-22 mins PT General Charges $$ ACUTE PT VISIT: 1 Visit                     Sylvan Delon Copp PT 01/08/2024  Acute Rehabilitation Services  Office [phone]

## 2024-01-09 DIAGNOSIS — G40919 Epilepsy, unspecified, intractable, without status epilepticus: Secondary | ICD-10-CM | POA: Diagnosis not present

## 2024-01-09 NOTE — TOC Progression Note (Signed)
 Transition of Care [facility]) - Progression Note   Patient Details  Name: Alicia Brennan MRN: [id number] Date of Birth: 1950-11-05  [facility]) CM/SW Contact  Duwaine GORMAN Aran, LCSW Phone Number: 01/09/2024, 11:08 AM  Clinical Narrative: Levon with admissions at [facility] notified CSW that family will not be able to pay for the copay for SNF, so the bed offer has been rescinded. CSW followed up with daughter. Per daughter, she is working with [facility] and various caseworkers to get the patient approved for Medicaid and confirmed patient will need to return to [facility] for rehab as family will not be able to meet the patient's needs at home. CSW confirmed with Kia in admissions at [facility] that a female bed will be available tomorrow pending insurance approval. CSW started insurance authorization on [employer] portal, which is pending. Reference ID # is: [id number]. Care management awaiting insurance authorization.  Expected Discharge Plan: Skilled Nursing Facility Barriers to Discharge: Insurance Authorization (Patient is in copay days for SNF.)  Expected Discharge Plan and Services In-house Referral: Clinical Social Work Post Acute Care Choice: Skilled Nursing Facility            DME Arranged: N/A DME Agency: NA  Social Drivers of Health (SDOH) Interventions SDOH Screenings   Food Insecurity: No Food Insecurity (01/04/2024)  Housing: Unknown (01/04/2024)  Transportation Needs: No Transportation Needs (01/04/2024)  Utilities: Not At Risk (01/04/2024)  Financial Resource Strain: Low Risk  (11/16/2023)   Received from [facility]  Social Connections: Unknown (01/04/2024)  Tobacco Use: Low Risk  (01/03/2024)   Readmission Risk Interventions     No data to display

## 2024-01-09 NOTE — Progress Notes (Signed)
 PROGRESS NOTE    Alicia Brennan  [name] DOB: [birth year] DOA: 01/02/2024 PCP: [name], MD   Brief Narrative:  73 year old woman PMH including vascular dementia, seizure disorder, presented from SNF for altered mental status, generalized weakness and possible seizure, abdominal pain nausea and vomiting, concern for not taking medications.  Recent COVID diagnosis.  Also found to have hydronephrosis and urology consulted. Admitted for breakthrough seizure, complicated UTI, left renal hydronephrosis and hydroureter, AKI.  Condition stabilized, no further seizures. Foley catheter will remain in place and she will follow-up with urology as an outpatient. Plan for SNF once location/approval received.  Assessment & Plan:   Principal Problem:   Breakthrough seizure (HCC) Active Problems:   Acute encephalopathy   Complicated UTI (urinary tract infection)   Hydronephrosis   AKI (acute kidney injury)  Breakthrough seizure Acute encephalopathy secondary to above - In context of nausea and vomiting, inability to tolerate medications.  CT head and MRI brain no acute abnormalities. EEG was unrevealing - Neurology recommended resuming antiepileptics. - Tolerating oral medications divalproex , Vimpat , Lamictal , Keppra    Complicated E. coli UTI Left renal hydronephrosis, hydroureter - Seen on CT - Seen by urology, underwent stent placement, will follow-up in 2 weeks to reassess urinalysis and plan ureteroscopically.  Per urology patient is catheter dependent, will follow-up with urology as an outpatient. - Narrowed antibiotics to oral therapy.   AKI - resolved - Multifactorial, dehydration, obstruction - Continue furosemide , telmisartan    Abnormal EKG Elevated troponin Hypertensive urgency - EKG nonspecific.  No signs or symptoms to suggest ACS.  Troponin elevation trivial.  No further evaluation suggested.  - Amlodipine  was stopped last hospitalization for lower extremity  edema.  Currently holding Lasix  and telmisartan. - Blood pressure stable.   Vascular dementia - stable  DVT prophylaxis: SCDs Start: 01/02/24 2214 Code Status:   Code Status: Full Code   Consultants:  Neurology, neurology  Procedures:  Cystoscopy with left ureteral stent placement EEG  Antimicrobials:  Cefadroxil  Subjective: No acute issues or events overnight, symptoms currently well-controlled denies nausea vomiting diarrhea constipation headache fevers chills or chest pain  Objective: Vitals:   01/08/24 2032 01/08/24 2246 01/09/24 0529 01/09/24 0608  BP: (!) 176/77 (!) 141/66 (!) 152/69 (!) 152/69  Pulse:  73 65   Resp:      Temp:  98.6 F (37 C) 98.7 F (37.1 C)   TempSrc:  Oral Oral   SpO2:  96% 95%   Weight:      Height:        Intake/Output Summary (Last 24 hours) at 01/09/2024 0739 Last data filed at 01/09/2024 0600 Gross per 24 hour  Intake 600 ml  Output 1750 ml  Net -1150 ml   Filed Weights   01/04/24 0800  Weight: 83.9 kg    Examination:  General:  Pleasantly resting in bed, No acute distress. HEENT:  Normocephalic atraumatic.  Sclerae nonicteric, noninjected.  Extraocular movements intact bilaterally. Neck:  Without mass or deformity.  Trachea is midline. Lungs:  Clear to auscultate bilaterally without rhonchi, wheeze, or rales. Heart:  Regular rate and rhythm.  Without murmurs, rubs, or gallops. Abdomen: Foley catheter intact Extremities: Without cyanosis, clubbing, edema, or obvious deformity. Skin: Right buttock wound noted.  Stage II  Data Reviewed: I have personally reviewed following labs and imaging studies  CBC: Recent Labs  Lab 01/02/24 1318 01/02/24 1350 01/03/24 0522 01/04/24 0345  WBC 7.2  --  6.2 7.8  NEUTROABS  --   --   --  6.0  HGB 14.8 16.0* 14.8 12.2  HCT 47.1* 47.0* 47.0* 39.2  MCV 91.8  --  92.7 94.2  PLT 157  --  145* 145*   Basic Metabolic Panel: Recent Labs  Lab 01/02/24 1318 01/02/24 1350  01/03/24 0522 01/04/24 0345 01/05/24 0333  NA 135 136 136 134* 135  K 3.9 4.0 3.4* 4.4 3.5  CL 96* 97* 100 102 105  CO2 28  --  21* 20* 22  GLUCOSE 110* 116* 91 106* 98  BUN 15 16 16  28* 24*  CREATININE 1.11* 1.40* 1.17* 1.42* 0.85  CALCIUM 9.9  --  9.5 9.2 8.4*   GFR: Estimated Creatinine Clearance: 62.2 mL/min (by C-G formula based on SCr of 0.85 mg/dL). Liver Function Tests: Recent Labs  Lab 01/02/24 1318 01/03/24 0522  AST 58* 44*  ALT 31 26  ALKPHOS 113 102  BILITOT 1.1 0.9  PROT 8.1 7.4  ALBUMIN  3.9 3.4*   No results for input(s): LIPASE, AMYLASE in the last 168 hours. Recent Labs  Lab 01/02/24 1319  AMMONIA 16   Coagulation Profile: No results for input(s): INR, PROTIME in the last 168 hours. Cardiac Enzymes: No results for input(s): CKTOTAL, CKMB, CKMBINDEX, TROPONINI in the last 168 hours. BNP (last 3 results) Recent Labs    12/06/23 1658  PROBNP 64.3   HbA1C: No results for input(s): HGBA1C in the last 72 hours. CBG: Recent Labs  Lab 01/02/24 1212 01/03/24 1356  GLUCAP 107* 78   Lipid Profile: No results for input(s): CHOL, HDL, LDLCALC, TRIG, CHOLHDL, LDLDIRECT in the last 72 hours. Thyroid Function Tests: No results for input(s): TSH, T4TOTAL, FREET4, T3FREE, THYROIDAB in the last 72 hours. Anemia Panel: No results for input(s): VITAMINB12, FOLATE, FERRITIN, TIBC, IRON, RETICCTPCT in the last 72 hours. Sepsis Labs: No results for input(s): PROCALCITON, LATICACIDVEN in the last 168 hours.  Recent Results (from the past 240 hours)  Urine Culture     Status: Abnormal   Collection Time: 01/02/24  5:56 PM   Specimen: Urine, Clean Catch  Result Value Ref Range Status   Specimen Description   Final    URINE, CLEAN CATCH Performed at [facility], [address] [place], [place] [address]    Special Requests   Final    NONE Performed at [facility], [address] [place], [place] [address]    Culture >=100,000 COLONIES/mL ESCHERICHIA COLI (A)  Final   Report Status 01/04/2024 FINAL  Final   Organism ID, Bacteria ESCHERICHIA COLI (A)  Final      Susceptibility   Escherichia coli - MIC*    AMPICILLIN >=32 RESISTANT Resistant     CEFAZOLIN  (URINE) Value in next row Sensitive      2 SENSITIVEThis is a modified FDA-approved test that has been validated and its performance characteristics determined by the reporting laboratory.  This laboratory is certified under the Clinical Laboratory Improvement Amendments CLIA as qualified to perform high complexity clinical laboratory testing.    CEFEPIME Value in next row Sensitive      2 SENSITIVEThis is a modified FDA-approved test that has been validated and its performance characteristics determined by the reporting laboratory.  This laboratory is certified under the Clinical Laboratory Improvement Amendments CLIA as qualified to perform high complexity clinical laboratory testing.    ERTAPENEM Value in next row Sensitive      2 SENSITIVEThis is a modified FDA-approved test that has been validated and its performance characteristics determined by the  reporting laboratory.  This laboratory is certified under the Clinical Laboratory Improvement Amendments CLIA as qualified to perform high complexity clinical laboratory testing.    CEFTRIAXONE  Value in next row Sensitive      2 SENSITIVEThis is a modified FDA-approved test that has been validated and its performance characteristics determined by the reporting laboratory.  This laboratory is certified under the Clinical Laboratory Improvement Amendments CLIA as qualified to perform high complexity clinical laboratory testing.    CIPROFLOXACIN Value in next row Intermediate      2 SENSITIVEThis is a modified FDA-approved test that has been validated and its performance characteristics determined by the reporting laboratory.  This laboratory is  certified under the Clinical Laboratory Improvement Amendments CLIA as qualified to perform high complexity clinical laboratory testing.    GENTAMICIN Value in next row Sensitive      2 SENSITIVEThis is a modified FDA-approved test that has been validated and its performance characteristics determined by the reporting laboratory.  This laboratory is certified under the Clinical Laboratory Improvement Amendments CLIA as qualified to perform high complexity clinical laboratory testing.    NITROFURANTOIN Value in next row Intermediate      2 SENSITIVEThis is a modified FDA-approved test that has been validated and its performance characteristics determined by the reporting laboratory.  This laboratory is certified under the Clinical Laboratory Improvement Amendments CLIA as qualified to perform high complexity clinical laboratory testing.    TRIMETH/SULFA Value in next row Resistant      2 SENSITIVEThis is a modified FDA-approved test that has been validated and its performance characteristics determined by the reporting laboratory.  This laboratory is certified under the Clinical Laboratory Improvement Amendments CLIA as qualified to perform high complexity clinical laboratory testing.    AMPICILLIN/SULBACTAM Value in next row Resistant      2 SENSITIVEThis is a modified FDA-approved test that has been validated and its performance characteristics determined by the reporting laboratory.  This laboratory is certified under the Clinical Laboratory Improvement Amendments CLIA as qualified to perform high complexity clinical laboratory testing.    PIP/TAZO Value in next row Sensitive      16 SENSITIVEThis is a modified FDA-approved test that has been validated and its performance characteristics determined by the reporting laboratory.  This laboratory is certified under the Clinical Laboratory Improvement Amendments CLIA as qualified to perform high complexity clinical laboratory testing.    MEROPENEM Value in  next row Sensitive      16 SENSITIVEThis is a modified FDA-approved test that has been validated and its performance characteristics determined by the reporting laboratory.  This laboratory is certified under the Clinical Laboratory Improvement Amendments CLIA as qualified to perform high complexity clinical laboratory testing.    * >=100,000 COLONIES/mL ESCHERICHIA COLI  Blood culture (routine x 2)     Status: None   Collection Time: 01/02/24  6:38 PM   Specimen: BLOOD  Result Value Ref Range Status   Specimen Description   Final    BLOOD SITE NOT SPECIFIED Performed at [facility], [address] [place], [place] [address]    Special Requests   Final    BOTTLES DRAWN AEROBIC AND ANAEROBIC Blood Culture adequate volume Performed at [facility], [address], [place], [place] [address]    Culture   Final    NO GROWTH 5 DAYS Performed at [facility], [address] [place], [place] [address]    Report Status 01/07/2024 FINAL  Final  Blood culture (routine x 2)     Status: None   Collection Time: 01/03/24  3:46 PM   Specimen: BLOOD RIGHT HAND  Result Value Ref Range Status   Specimen Description   Final    BLOOD RIGHT HAND Performed at [facility], [address] [place], [place] [address]    Special Requests   Final    BOTTLES DRAWN AEROBIC AND ANAEROBIC Blood Culture adequate volume Performed at [facility], [address] [place], [place] [address]    Culture   Final    NO GROWTH 5 DAYS Performed at [facility], [address] [place], [place] [address]    Report Status 01/08/2024 FINAL  Final      Radiology Studies: No results found.  Scheduled Meds:  cefadroxil  500 mg Oral BID   Chlorhexidine  Gluconate Cloth  6 each Topical Daily   divalproex   250 mg Oral Q8H   feeding supplement  237 mL Oral BID BM   furosemide   40 mg Oral Daily   hydrALAZINE   25 mg Oral Q8H   irbesartan   150  mg Oral Daily   lacosamide   50 mg Oral BID   lamoTRIgine   25 mg Oral BID   levETIRAcetam   500 mg Oral BID   polyethylene glycol  17 g Oral BID   senna  1 tablet Oral QHS   Continuous Infusions:   LOS: 7 days   Time spent:  Elsie JAYSON Montclair, DO [facility]  If 7PM-7AM, please contact night-coverage [email or web address]  01/09/2024, 7:39 AM

## 2024-01-10 MED ORDER — CEFADROXIL 500 MG PO CAPS: 500.0000 mg | ORAL_CAPSULE | Freq: Two times a day (BID) | ORAL | 0 refills | Status: AC

## 2024-01-10 MED ORDER — ENSURE PLUS HIGH PROTEIN PO LIQD: 237.0000 mL | Freq: Two times a day (BID) | ORAL | 0 refills | Status: DC

## 2024-01-10 MED ORDER — LACOSAMIDE 50 MG PO TABS: 50.0000 mg | ORAL_TABLET | Freq: Two times a day (BID) | ORAL | 0 refills | Status: DC

## 2024-01-10 NOTE — Progress Notes (Signed)
 01/10/2024 2:47 PM -----------------------------------------------------------[facility]--------------------------------------------------- D(Data) A(Action) R(response)     Data: Discharge Readiness Assessment EDD today 01/10/2024    Action: Chart reviewed    Barriers to discharge: waiting for transport to SNF per notes    Allyanna Appleman, RN [facility]

## 2024-01-10 NOTE — TOC Transition Note (Signed)
 Transition of Care [facility]) - Discharge Note  Patient Details  Name: Alicia Brennan MRN: [id number] Date of Birth: 30-Apr-1950  [facility] CM/SW Contact:  Alicia GORMAN Aran, LCSW Phone Number: 01/10/2024, 11:16 AM  Clinical Narrative: Patient has been approved for [facility] for rehab. Plan auth ID # is: [id number]. Patient is approved for 01/10/2024-01/14/2024. CSW notified by CMA that daughter wanted the [facility] sent to [facility] SNFs. CSW faxed referral to both facilities. CSW spoke with Joen in admissions at [facility] and she confirmed the facility cannot accept the patient at this time. CSW left voicemail for admissions at [facility]. CSW contacted by Isaiah, who covers [place] and she is aware patient does not have Medicaid in place at this time to cover the copay.  CSW notified daughter that patient will have to return to [facility] and she will need to work with the facility to get the [facility] application completed. The number for report is [phone]. Discharge summary, discharge orders, and SNF transfer report faxed to facility in hub.  Final next level of care: Skilled Nursing Facility Barriers to Discharge: Barriers Resolved  Patient Goals and CMS Choice Patient states their goals for this hospitalization and ongoing recovery are:: Rehab CMS Medicare.gov Compare Post Acute Care list provided to:: Patient Represenative (must comment) Choice offered to / list presented to : Adult Children  Discharge Placement Existing PASRR number confirmed : 01/07/24          Patient chooses bed at: [facility] Patient to be transferred to facility by: [facility] Name of family member notified: [name] Darling (daughter) Patient and family notified of of transfer: 01/10/24  Discharge Plan and Services Additional resources added to the After Visit Summary for   In-house Referral: Clinical Social Work Post Acute Care Choice: Skilled Nursing Facility          DME Arranged: N/A DME Agency:  NA  Social Drivers of Health (SDOH) Interventions SDOH Screenings   Food Insecurity: No Food Insecurity (01/04/2024)  Housing: Unknown (01/04/2024)  Transportation Needs: No Transportation Needs (01/04/2024)  Utilities: Not At Risk (01/04/2024)  Financial Resource Strain: Low Risk  (11/16/2023)   Received from [facility]  Social Connections: Unknown (01/04/2024)  Tobacco Use: Low Risk  (01/03/2024)   Readmission Risk Interventions     No data to display

## 2024-01-10 NOTE — Discharge Summary (Signed)
 Physician Discharge Summary  Alicia Brennan [name] DOB: 04-08-1950 DOA: 01/02/2024  PCP: Alicia Tor Edra [name], MD  Admit date: 01/02/2024 Discharge date: 01/10/2024  Admitted From: SNF, [facility] Disposition: Same  Recommendations for Outpatient Follow-up:  Follow up with PCP in 1-2 weeks Follow-up with urology as scheduled, follow-up with neurology scheduled  Discharge Condition: Stable CODE STATUS: Full Diet recommendation: Low-salt low-fat low-carb diet  Brief/Interim Summary: 73 year old woman PMH including vascular dementia, seizure disorder, presented from SNF for altered mental status, generalized weakness and possible seizure, abdominal pain nausea and vomiting, concern for not taking medications.  Recent COVID diagnosis.  Also found to have hydronephrosis and urology consulted. Admitted for breakthrough seizure, complicated UTI, left renal hydronephrosis and hydroureter, AKI.  Condition stabilized, no further seizures. Foley catheter will remain in place and she will follow-up with urology as an outpatient. Plan for SNF once location/approval received.  As above patient presented with E. coli UTI left renal hydronephrosis and hydroureter requiring Foley placement with urology.  Patient will complete antibiotics 01/13/2024.  Hospitalization she had notable breakthrough seizure despite medications, neurology consulted with unrevealing EEG as below.  Resume home Depakote  Vimpat  Lamictal  and Keppra  with no changes.  Patient's AKI has resolved and she is otherwise back to baseline.  Recommend close follow-up with PCP and neurology and urology as scheduled in the next few weeks.  Discharge Diagnoses:  Principal Problem:   Breakthrough seizure (HCC) Active Problems:   Acute encephalopathy   Complicated UTI (urinary tract infection)   Hydronephrosis   AKI (acute kidney injury)  Breakthrough seizure Acute encephalopathy secondary to above - In context of nausea  and vomiting, inability to tolerate medications.  CT head and MRI brain no acute abnormalities. EEG was unrevealing - Neurology recommended resuming antiepileptics. - Tolerating oral medications divalproex , Vimpat , Lamictal , Keppra    Complicated E. coli UTI Left renal hydronephrosis, hydroureter - Seen on CT - Seen by urology, underwent stent placement, will follow-up in 2 weeks to reassess urinalysis and plan ureteroscopically.  Per urology patient is catheter dependent, will follow-up with urology as an outpatient. - Narrowed antibiotics to oral therapy to complete 10/19   AKI - resolved - Multifactorial, dehydration, obstruction - Continue furosemide , telmisartan    Abnormal EKG Elevated troponin Hypertensive urgency - EKG nonspecific.  No signs or symptoms to suggest ACS.  Troponin elevation trivial.  No further evaluation suggested.  - Amlodipine  was stopped last hospitalization for lower extremity edema.  Currently holding Lasix  and telmisartan. - Blood pressure stable.   Vascular dementia - stable  Discharge Instructions  Discharge Instructions     Call MD for:  difficulty breathing, headache or visual disturbances   Complete by: As directed    Call MD for:  extreme fatigue   Complete by: As directed    Call MD for:  hives   Complete by: As directed    Call MD for:  persistant dizziness or light-headedness   Complete by: As directed    Call MD for:  persistant nausea and vomiting   Complete by: As directed    Call MD for:  severe uncontrolled pain   Complete by: As directed    Call MD for:  temperature >100.4   Complete by: As directed    Diet - low sodium heart healthy   Complete by: As directed    Increase activity slowly   Complete by: As directed    No wound care   Complete by: As directed  Allergies as of 01/10/2024   No Known Allergies      Medication List     TAKE these medications    cefadroxil 500 MG capsule Commonly known as:  DURICEF Take 1 capsule (500 mg total) by mouth 2 (two) times daily for 17 doses.   divalproex  250 MG DR tablet Commonly known as: Depakote  Take 1 tablet (250 mg total) by mouth 3 (three) times daily. What changed:  when to take this additional instructions   feeding supplement Liqd Take 237 mLs by mouth 2 (two) times daily between meals.   furosemide  40 MG tablet Commonly known as: LASIX  Take 1 tablet (40 mg total) by mouth daily.   hydrALAZINE  25 MG tablet Commonly known as: APRESOLINE  Take 1 tablet (25 mg total) by mouth every 8 (eight) hours. What changed:  when to take this additional instructions   lacosamide  50 MG Tabs tablet Commonly known as: VIMPAT  Take 1 tablet (50 mg total) by mouth 2 (two) times daily.   lactulose  10 GM/15ML solution Commonly known as: CHRONULAC  Take 15 mLs (10 g total) by mouth daily.   lamoTRIgine  25 MG tablet Commonly known as: LAMICTAL  Take 25 mg by mouth 2 (two) times daily.   levETIRAcetam  500 MG tablet Commonly known as: KEPPRA  Take 1 tablet (500 mg total) by mouth 2 (two) times daily.   Nayzilam  5 MG/0.1ML Soln Generic drug: Midazolam  GIVE ONE SPRAY INTO 1 NOSTRIL. IF NO RESPONSE IN 10 MINUTES, MAY GIVE SECOND SPRAY IN OTHER NOSTRIL. DO NOT GIVE SECOND DOSE IF PATIENT HAS TROUBLE BREATHING OR EXCESSIVE SEDATION, MAX 2 DOSES/SEIZURE EPISODE, 1 EPISODE EVERY 3 DAYS OR 5 EPISODES/MONTH Nasal for 6 Days   telmisartan 80 MG tablet Commonly known as: MICARDIS Take 80 mg by mouth daily.        Contact information for after-discharge care     Destination     [facility] .   Service: Skilled Nursing Contact information: [address] [place]  [address] [phone]                    No Known Allergies  Consultations: Neurology, urology  Procedures/Studies: EEG adult Result Date: 01/03/2024 Brennan Alicia KIDD, MD     01/03/2024  3:31 PM Patient Name: Alicia Brennan MRN: [id number] Epilepsy  Attending: Arlin KIDD Brennan Referring Physician/Provider: Alfornia Madison, MD Date: 01/03/2024 Duration: 25.44 mins Patient history: 73yo F with breakthrough sz. EEG to evaluate for seizure Level of alertness: Awake AEDs during EEG study: LEV, LCM LTG, VPA Technical aspects: This EEG study was done with scalp electrodes positioned according to the 10-20 International system of electrode placement. Electrical activity was reviewed with band pass filter of 1-70Hz , sensitivity of 7 uV/mm, display speed of 68mm/sec with a 60Hz  notched filter applied as appropriate. EEG data were recorded continuously and digitally stored.  Video monitoring was available and reviewed as appropriate. Description: The posterior dominant rhythm consists of 8 Hz activity of moderate voltage (25-35 uV) seen predominantly in posterior head regions, symmetric and reactive to eye opening and eye closing. Hyperventilation and photic stimulation were not performed.   IMPRESSION: This study is within normal limits. No seizures or epileptiform discharges were seen throughout the recording. A normal interictal EEG does not exclude the diagnosis of epilepsy. Alicia Brennan   DG C-Arm 1-60 Min-No Report Result Date: 01/03/2024 Fluoroscopy was utilized by the requesting physician.  No radiographic interpretation.   MR BRAIN WO CONTRAST Result Date: 01/02/2024 EXAM: MR Brain  without Intravenous Contrast. CLINICAL HISTORY: Neuro deficit, acute, stroke suspected. Neuro deficit; pt has had weakness over the last week. TECHNIQUE: Magnetic resonance images of the brain without intravenous contrast in multiple planes. CONTRAST: Without. COMPARISON: CT head from earlier today. MRI head 09/12/2023. FINDINGS: BRAIN: No restricted diffusion to indicate acute infarction. No intracranial mass or hemorrhage. No midline shift or extra-axial fluid collection. The central arterial and venous flow voids are patent. Cerebral atrophy. Mild for age T2 FLAIR  hyperintensities of the white matter, compatible with chronic microvascular ischemic changes. VENTRICLES: No hydrocephalus. ORBITS: The orbits are normal. SINUSES AND MASTOIDS: The sinuses and mastoid air cells are clear. BONES: No acute fracture or focal osseous lesion. IMPRESSION: 1. No acute abnormality. Electronically signed by: Gilmore Molt MD 01/02/2024 07:00 PM EDT RP Workstation: [id number]   CT Head Wo Contrast Result Date: 01/02/2024 CLINICAL DATA:  Weakness and fatigue. EXAM: CT HEAD WITHOUT CONTRAST TECHNIQUE: Contiguous axial images were obtained from the base of the skull through the vertex without intravenous contrast. RADIATION DOSE REDUCTION: This exam was performed according to the departmental dose-optimization program which includes automated exposure control, adjustment of the mA and/or kV according to patient size and/or use of iterative reconstruction technique. COMPARISON:  December 06, 2023 FINDINGS: Brain: There is generalized cerebral atrophy with widening of the extra-axial spaces and ventricular dilatation. There are areas of decreased attenuation within the white matter tracts of the supratentorial brain, consistent with microvascular disease changes. Vascular: No hyperdense vessel or unexpected calcification. Skull: Normal. Negative for fracture or focal lesion. Sinuses/Orbits: No acute finding. Other: None. IMPRESSION: 1. Generalized cerebral atrophy and microvascular disease changes of the supratentorial brain. 2. No acute intracranial abnormality. Electronically Signed   By: Suzen Dials M.D.   On: 01/02/2024 16:17   CT ABDOMEN PELVIS W CONTRAST Result Date: 01/02/2024 CLINICAL DATA:  Acute nonlocalized abdominal pain. Increasing weakness over the past week. EXAM: CT ABDOMEN AND PELVIS WITH CONTRAST TECHNIQUE: Multidetector CT imaging of the abdomen and pelvis was performed using the standard protocol following bolus administration of intravenous contrast. RADIATION  DOSE REDUCTION: This exam was performed according to the departmental dose-optimization program which includes automated exposure control, adjustment of the mA and/or kV according to patient size and/or use of iterative reconstruction technique. CONTRAST:  100mL OMNIPAQUE IOHEXOL 300 MG/ML  SOLN COMPARISON:  05/05/2011 FINDINGS: Lower chest: Infiltration or atelectasis in the lung bases. Small esophageal hiatal hernia. Hepatobiliary: No focal liver lesions. Gallbladder is not visualized, possibly contracted or surgically absent. No bile duct dilatation. Pancreas: Unremarkable. No pancreatic ductal dilatation or surrounding inflammatory changes. Spleen: Normal in size without focal abnormality. Adrenals/Urinary Tract: No adrenal gland nodules. Kidneys are symmetrical. Moderate left hydronephrosis and hydroureter. Hydroureter extends down to the level of the bladder. Unfortunately due to streak artifact from hip arthroplasties, the distal ureter and bladder are not well visualized. Changes could represent obstruction due to a nonvisualized distal ureteral stone or this could be due to reflux disease. The bladder is decompressed with a Foley catheter. Stomach/Bowel: Stomach, small bowel, and colon are not abnormally distended. No wall thickening or inflammatory stranding. Appendix is not identified. Vascular/Lymphatic: Aortic atherosclerosis. No enlarged abdominal or pelvic lymph nodes. Reproductive: Uterus and bilateral adnexa are unremarkable. Other: Small amount of free fluid in the pelvis is nonspecific, probably reactive. Broad-based ventral abdominal wall hernia at the umbilicus containing fat and transverse colon without proximal obstruction. Musculoskeletal: Degenerative changes in the spine. Mild anterior subluxation of L4 on L5 is likely degenerative.  Bilateral total hip arthroplasties. IMPRESSION: 1. Left renal hydronephrosis and hydroureter to the level of the bladder. Distal ureter and bladder are not well  visualized due to streak artifact. Changes may indicate obstruction due to a nonvisualized distal ureteral stone or reflux. 2. No evidence of bowel obstruction or inflammation. 3. Mild aortic atherosclerosis. 4. Infiltration or atelectasis seen in both lung bases. 5. Small esophageal hiatal hernia. Broad-based ventral abdominal wall hernia containing colon but without proximal obstruction. Electronically Signed   By: Elsie Gravely M.D.   On: 01/02/2024 16:09   DG Chest 2 View Result Date: 01/02/2024 CLINICAL DATA:  Shortness of breath.  AMS. EXAM: CHEST - 2 VIEW COMPARISON:  12/06/2023. FINDINGS: The heart size and mediastinal contours are unchanged. Aortic atherosclerosis. No overt edema, focal consolidation, pleural effusion, or pneumothorax. No acute osseous abnormality. IMPRESSION: 1. No acute cardiopulmonary findings. 2.  Aortic Atherosclerosis (ICD10-I70.0). Electronically Signed   By: Harrietta Sherry M.D.   On: 01/02/2024 13:23     Subjective: No acute issues or events overnight denies nausea vomit diarrhea constipation chills or chest pain   Discharge Exam: Vitals:   01/09/24 1910 01/10/24 0237  BP: (!) 145/73 128/69  Pulse: 77 74  Resp: 19 17  Temp: 97.8 F (36.6 C) 98.1 F (36.7 C)  SpO2: 98% 97%   Vitals:   01/09/24 0608 01/09/24 1316 01/09/24 1910 01/10/24 0237  BP: (!) 152/69 (!) 157/75 (!) 145/73 128/69  Pulse:  70 77 74  Resp:  18 19 17   Temp:  98.7 F (37.1 C) 97.8 F (36.6 C) 98.1 F (36.7 C)  TempSrc:  Oral Oral Oral  SpO2:  99% 98% 97%  Weight:      Height:        General:  Pleasantly resting in bed, No acute distress. HEENT:  Normocephalic atraumatic.  Sclerae nonicteric, noninjected.  Extraocular movements intact bilaterally. Neck:  Without mass or deformity.  Trachea is midline. Lungs:  Clear to auscultate bilaterally without rhonchi, wheeze, or rales. Heart:  Regular rate and rhythm.  Without murmurs, rubs, or gallops. Abdomen:  Soft, nontender,  nondistended.  Without guarding or rebound. GU: foley intact, draining clear yellow urine Extremities: Without cyanosis, clubbing, edema, or obvious deformity. Skin:  Warm and dry, no erythema.    The results of significant diagnostics from this hospitalization (including imaging, microbiology, ancillary and laboratory) are listed below for reference.     Microbiology: Recent Results (from the past 240 hours)  Urine Culture     Status: Abnormal   Collection Time: 01/02/24  5:56 PM   Specimen: Urine, Clean Catch  Result Value Ref Range Status   Specimen Description   Final    URINE, CLEAN CATCH Performed at [facility], [address] [place], [place] [address]    Special Requests   Final    NONE Performed at [facility], [address], [place], [place] [address]    Culture >=100,000 COLONIES/mL ESCHERICHIA COLI (A)  Final   Report Status 01/04/2024 FINAL  Final   Organism ID, Bacteria ESCHERICHIA COLI (A)  Final      Susceptibility   Escherichia coli - MIC*    AMPICILLIN >=32 RESISTANT Resistant     CEFAZOLIN  (URINE) Value in next row Sensitive      2 SENSITIVEThis is a modified FDA-approved test that has been validated and its performance characteristics determined by the reporting laboratory.  This laboratory is certified under the Clinical Laboratory Improvement Amendments CLIA  as qualified to perform high complexity clinical laboratory testing.    CEFEPIME Value in next row Sensitive      2 SENSITIVEThis is a modified FDA-approved test that has been validated and its performance characteristics determined by the reporting laboratory.  This laboratory is certified under the Clinical Laboratory Improvement Amendments CLIA as qualified to perform high complexity clinical laboratory testing.    ERTAPENEM Value in next row Sensitive      2 SENSITIVEThis is a modified FDA-approved test that has been validated and its performance  characteristics determined by the reporting laboratory.  This laboratory is certified under the Clinical Laboratory Improvement Amendments CLIA as qualified to perform high complexity clinical laboratory testing.    CEFTRIAXONE  Value in next row Sensitive      2 SENSITIVEThis is a modified FDA-approved test that has been validated and its performance characteristics determined by the reporting laboratory.  This laboratory is certified under the Clinical Laboratory Improvement Amendments CLIA as qualified to perform high complexity clinical laboratory testing.    CIPROFLOXACIN Value in next row Intermediate      2 SENSITIVEThis is a modified FDA-approved test that has been validated and its performance characteristics determined by the reporting laboratory.  This laboratory is certified under the Clinical Laboratory Improvement Amendments CLIA as qualified to perform high complexity clinical laboratory testing.    GENTAMICIN Value in next row Sensitive      2 SENSITIVEThis is a modified FDA-approved test that has been validated and its performance characteristics determined by the reporting laboratory.  This laboratory is certified under the Clinical Laboratory Improvement Amendments CLIA as qualified to perform high complexity clinical laboratory testing.    NITROFURANTOIN Value in next row Intermediate      2 SENSITIVEThis is a modified FDA-approved test that has been validated and its performance characteristics determined by the reporting laboratory.  This laboratory is certified under the Clinical Laboratory Improvement Amendments CLIA as qualified to perform high complexity clinical laboratory testing.    TRIMETH/SULFA Value in next row Resistant      2 SENSITIVEThis is a modified FDA-approved test that has been validated and its performance characteristics determined by the reporting laboratory.  This laboratory is certified under the Clinical Laboratory Improvement Amendments CLIA as qualified to  perform high complexity clinical laboratory testing.    AMPICILLIN/SULBACTAM Value in next row Resistant      2 SENSITIVEThis is a modified FDA-approved test that has been validated and its performance characteristics determined by the reporting laboratory.  This laboratory is certified under the Clinical Laboratory Improvement Amendments CLIA as qualified to perform high complexity clinical laboratory testing.    PIP/TAZO Value in next row Sensitive      16 SENSITIVEThis is a modified FDA-approved test that has been validated and its performance characteristics determined by the reporting laboratory.  This laboratory is certified under the Clinical Laboratory Improvement Amendments CLIA as qualified to perform high complexity clinical laboratory testing.    MEROPENEM Value in next row Sensitive      16 SENSITIVEThis is a modified FDA-approved test that has been validated and its performance characteristics determined by the reporting laboratory.  This laboratory is certified under the Clinical Laboratory Improvement Amendments CLIA as qualified to perform high complexity clinical laboratory testing.    * >=100,000 COLONIES/mL ESCHERICHIA COLI  Blood culture (routine x 2)     Status: None   Collection Time: 01/02/24  6:38 PM   Specimen: BLOOD  Result Value Ref Range Status  Specimen Description   Final    BLOOD SITE NOT SPECIFIED Performed at [facility], [address], [place], [place] [address]    Special Requests   Final    BOTTLES DRAWN AEROBIC AND ANAEROBIC Blood Culture adequate volume Performed at [facility], [address] [place], [place] [address]    Culture   Final    NO GROWTH 5 DAYS Performed at [facility], [address], [place], [place] [address]    Report Status 01/07/2024 FINAL  Final  Blood culture (routine x 2)     Status: None   Collection Time: 01/03/24  3:46 PM   Specimen: BLOOD RIGHT HAND  Result Value Ref Range  Status   Specimen Description   Final    BLOOD RIGHT HAND Performed at [facility], [address] [place], [place] [address]    Special Requests   Final    BOTTLES DRAWN AEROBIC AND ANAEROBIC Blood Culture adequate volume Performed at [facility], [address], [place], [place] [address]    Culture   Final    NO GROWTH 5 DAYS Performed at [facility], [address] [place], [place] [address]    Report Status 01/08/2024 FINAL  Final     Labs: BNP (last 3 results) Recent Labs    09/12/23 1138  BNP 45.2   Basic Metabolic Panel: Recent Labs  Lab 01/04/24 0345 01/05/24 0333  NA 134* 135  K 4.4 3.5  CL 102 105  CO2 20* 22  GLUCOSE 106* 98  BUN 28* 24*  CREATININE 1.42* 0.85  CALCIUM 9.2 8.4*   Liver Function Tests: No results for input(s): AST, ALT, ALKPHOS, BILITOT, PROT, ALBUMIN  in the last 168 hours. No results for input(s): LIPASE, AMYLASE in the last 168 hours. No results for input(s): AMMONIA in the last 168 hours. CBC: Recent Labs  Lab 01/04/24 0345  WBC 7.8  NEUTROABS 6.0  HGB 12.2  HCT 39.2  MCV 94.2  PLT 145*   Cardiac Enzymes: No results for input(s): CKTOTAL, CKMB, CKMBINDEX, TROPONINI in the last 168 hours. BNP: Invalid input(s): POCBNP CBG: Recent Labs  Lab 01/03/24 1356  GLUCAP 78   D-Dimer No results for input(s): DDIMER in the last 72 hours. Hgb A1c No results for input(s): HGBA1C in the last 72 hours. Lipid Profile No results for input(s): CHOL, HDL, LDLCALC, TRIG, CHOLHDL, LDLDIRECT in the last 72 hours. Thyroid function studies No results for input(s): TSH, T4TOTAL, T3FREE, THYROIDAB in the last 72 hours.  Invalid input(s): FREET3 Anemia work up No results for input(s): VITAMINB12, FOLATE, FERRITIN, TIBC, IRON, RETICCTPCT in the last 72 hours. Urinalysis    Component Value Date/Time   COLORURINE YELLOW 01/02/2024 1451    APPEARANCEUR TURBID (A) 01/02/2024 1451   APPEARANCEUR Clear 05/17/2013 1525   LABSPEC 1.010 01/02/2024 1451   LABSPEC 1.019 05/17/2013 1525   PHURINE 8.0 01/02/2024 1451   GLUCOSEU NEGATIVE 01/02/2024 1451   GLUCOSEU Negative 05/17/2013 1525   HGBUR LARGE (A) 01/02/2024 1451   BILIRUBINUR NEGATIVE 01/02/2024 1451   BILIRUBINUR Negative 05/17/2013 1525   KETONESUR NEGATIVE 01/02/2024 1451   PROTEINUR 100 (A) 01/02/2024 1451   NITRITE NEGATIVE 01/02/2024 1451   LEUKOCYTESUR LARGE (A) 01/02/2024 1451   LEUKOCYTESUR Trace 05/17/2013 1525   Sepsis Labs Recent Labs  Lab 01/04/24 0345  WBC 7.8   Microbiology Recent Results (from the past 240 hours)  Urine Culture  Status: Abnormal   Collection Time: 01/02/24  5:56 PM   Specimen: Urine, Clean Catch  Result Value Ref Range Status   Specimen Description   Final    URINE, CLEAN CATCH Performed at [facility], [address], [place], [place] [address]    Special Requests   Final    NONE Performed at [facility], [address] [place], [place] [address]    Culture >=100,000 COLONIES/mL ESCHERICHIA COLI (A)  Final   Report Status 01/04/2024 FINAL  Final   Organism ID, Bacteria ESCHERICHIA COLI (A)  Final      Susceptibility   Escherichia coli - MIC*    AMPICILLIN >=32 RESISTANT Resistant     CEFAZOLIN  (URINE) Value in next row Sensitive      2 SENSITIVEThis is a modified FDA-approved test that has been validated and its performance characteristics determined by the reporting laboratory.  This laboratory is certified under the Clinical Laboratory Improvement Amendments CLIA as qualified to perform high complexity clinical laboratory testing.    CEFEPIME Value in next row Sensitive      2 SENSITIVEThis is a modified FDA-approved test that has been validated and its performance characteristics determined by the reporting laboratory.  This laboratory is certified under the Clinical Laboratory  Improvement Amendments CLIA as qualified to perform high complexity clinical laboratory testing.    ERTAPENEM Value in next row Sensitive      2 SENSITIVEThis is a modified FDA-approved test that has been validated and its performance characteristics determined by the reporting laboratory.  This laboratory is certified under the Clinical Laboratory Improvement Amendments CLIA as qualified to perform high complexity clinical laboratory testing.    CEFTRIAXONE  Value in next row Sensitive      2 SENSITIVEThis is a modified FDA-approved test that has been validated and its performance characteristics determined by the reporting laboratory.  This laboratory is certified under the Clinical Laboratory Improvement Amendments CLIA as qualified to perform high complexity clinical laboratory testing.    CIPROFLOXACIN Value in next row Intermediate      2 SENSITIVEThis is a modified FDA-approved test that has been validated and its performance characteristics determined by the reporting laboratory.  This laboratory is certified under the Clinical Laboratory Improvement Amendments CLIA as qualified to perform high complexity clinical laboratory testing.    GENTAMICIN Value in next row Sensitive      2 SENSITIVEThis is a modified FDA-approved test that has been validated and its performance characteristics determined by the reporting laboratory.  This laboratory is certified under the Clinical Laboratory Improvement Amendments CLIA as qualified to perform high complexity clinical laboratory testing.    NITROFURANTOIN Value in next row Intermediate      2 SENSITIVEThis is a modified FDA-approved test that has been validated and its performance characteristics determined by the reporting laboratory.  This laboratory is certified under the Clinical Laboratory Improvement Amendments CLIA as qualified to perform high complexity clinical laboratory testing.    TRIMETH/SULFA Value in next row Resistant      2 SENSITIVEThis  is a modified FDA-approved test that has been validated and its performance characteristics determined by the reporting laboratory.  This laboratory is certified under the Clinical Laboratory Improvement Amendments CLIA as qualified to perform high complexity clinical laboratory testing.    AMPICILLIN/SULBACTAM Value in next row Resistant      2 SENSITIVEThis is a modified FDA-approved test that has been validated and its performance characteristics determined by the reporting laboratory.  This laboratory is certified under the Clinical Laboratory Improvement Amendments CLIA as qualified to perform high complexity clinical laboratory testing.    PIP/TAZO Value in next row Sensitive      16 SENSITIVEThis is a modified FDA-approved test that has been validated and its performance characteristics determined by the reporting laboratory.  This laboratory is certified under the Clinical Laboratory Improvement Amendments CLIA as qualified to perform high complexity clinical laboratory testing.    MEROPENEM Value in next row Sensitive      16 SENSITIVEThis is a modified FDA-approved test that has been validated and its performance characteristics determined by the reporting laboratory.  This laboratory is certified under the Clinical Laboratory Improvement Amendments CLIA as qualified to perform high complexity clinical laboratory testing.    * >=100,000 COLONIES/mL ESCHERICHIA COLI  Blood culture (routine x 2)     Status: None   Collection Time: 01/02/24  6:38 PM   Specimen: BLOOD  Result Value Ref Range Status   Specimen Description   Final    BLOOD SITE NOT SPECIFIED Performed at [facility], [address], [place], [place] [address]    Special Requests   Final    BOTTLES DRAWN AEROBIC AND ANAEROBIC Blood Culture adequate volume Performed at [facility], [address], [place], [place] [address]    Culture   Final    NO GROWTH 5 DAYS Performed at [facility], [address], [place], [place] [address]    Report Status 01/07/2024 FINAL  Final  Blood culture (routine x 2)     Status: None   Collection Time: 01/03/24  3:46 PM   Specimen: BLOOD RIGHT HAND  Result Value Ref Range Status   Specimen Description   Final    BLOOD RIGHT HAND Performed at [facility], [address], [place], [place] [address]    Special Requests   Final    BOTTLES DRAWN AEROBIC AND ANAEROBIC Blood Culture adequate volume Performed at [facility], [address], [place], [place] [address]    Culture   Final    NO GROWTH 5 DAYS Performed at [facility], [address], [place], [place] [address]    Report Status 01/08/2024 FINAL  Final     Time coordinating discharge: Over 30 minutes  SIGNED:   Elsie JAYSON Montclair, DO [facility] 01/10/2024, 11:47 AM Pager   If 7PM-7AM, please contact night-coverage [email or web address]

## 2024-01-10 NOTE — Plan of Care (Signed)
  Problem: Education: Goal: Knowledge of General Education information will improve Description: Including pain rating scale, medication(s)/side effects and non-pharmacologic comfort measures Outcome: Adequate for Discharge   Problem: Health Behavior/Discharge Planning: Goal: Ability to manage health-related needs will improve Outcome: Adequate for Discharge   Problem: Clinical Measurements: Goal: Ability to maintain clinical measurements within normal limits will improve Outcome: Adequate for Discharge Goal: Will remain free from infection Outcome: Adequate for Discharge Goal: Diagnostic test results will improve Outcome: Adequate for Discharge Goal: Respiratory complications will improve Outcome: Adequate for Discharge Goal: Cardiovascular complication will be avoided Outcome: Adequate for Discharge   Problem: Activity: Goal: Risk for activity intolerance will decrease Outcome: Adequate for Discharge   Problem: Nutrition: Goal: Adequate nutrition will be maintained Outcome: Adequate for Discharge   Problem: Coping: Goal: Level of anxiety will decrease Outcome: Adequate for Discharge   Problem: Elimination: Goal: Will not experience complications related to bowel motility Outcome: Adequate for Discharge Goal: Will not experience complications related to urinary retention Outcome: Adequate for Discharge   Problem: Pain Managment: Goal: General experience of comfort will improve and/or be controlled Outcome: Adequate for Discharge   Problem: Safety: Goal: Ability to remain free from injury will improve Outcome: Adequate for Discharge   Problem: Skin Integrity: Goal: Risk for impaired skin integrity will decrease Outcome: Adequate for Discharge   Patient transferred to SNF for continued care.

## 2024-01-21 NOTE — Telephone Encounter (Signed)
 Called patient's daughter, Judson.  Patient's condition has not changed and she remains in the SNF.  She has a left urethral stent and a foley catheter.  She continues to have a cognitive decline.  Patient's December appointment with Linh Ngo will be rescheduled to 02/14/24 at 1:45 pm.   Judson will make transportation arrangements with the SNF.  Provided clinic scheduling number, so patient can follow up with the cognitive appointment that was cancelled when she was in the hospital.  Will route to provider.

## 2024-01-21 NOTE — Telephone Encounter (Signed)
 Thank you Faith!

## 2024-01-31 ENCOUNTER — Emergency Department (HOSPITAL_COMMUNITY)

## 2024-01-31 ENCOUNTER — Inpatient Hospital Stay (HOSPITAL_COMMUNITY)
Admission: EM | Admit: 2024-01-31 | Discharge: 2024-02-09 | DRG: 699 | Disposition: A | Discharge status: Skilled Nursing Facility | Attending: Internal Medicine | Admitting: Internal Medicine

## 2024-01-31 ENCOUNTER — Encounter (HOSPITAL_COMMUNITY): Payer: Self-pay | Admitting: Family Medicine

## 2024-01-31 DIAGNOSIS — F0150 Vascular dementia without behavioral disturbance: Secondary | ICD-10-CM | POA: Diagnosis not present

## 2024-01-31 DIAGNOSIS — T83511A Infection and inflammatory reaction due to indwelling urethral catheter, initial encounter: Principal | ICD-10-CM | POA: Diagnosis present

## 2024-01-31 DIAGNOSIS — R310 Gross hematuria: Secondary | ICD-10-CM | POA: Diagnosis present

## 2024-01-31 DIAGNOSIS — N390 Urinary tract infection, site not specified: Principal | ICD-10-CM | POA: Diagnosis present

## 2024-01-31 DIAGNOSIS — Z751 Person awaiting admission to adequate facility elsewhere: Secondary | ICD-10-CM

## 2024-01-31 DIAGNOSIS — E66811 Obesity, class 1: Secondary | ICD-10-CM | POA: Diagnosis present

## 2024-01-31 DIAGNOSIS — R319 Hematuria, unspecified: Secondary | ICD-10-CM | POA: Diagnosis present

## 2024-01-31 DIAGNOSIS — L89156 Pressure-induced deep tissue damage of sacral region: Secondary | ICD-10-CM | POA: Diagnosis present

## 2024-01-31 DIAGNOSIS — R112 Nausea with vomiting, unspecified: Secondary | ICD-10-CM | POA: Diagnosis present

## 2024-01-31 DIAGNOSIS — D860 Sarcoidosis of lung: Secondary | ICD-10-CM | POA: Diagnosis present

## 2024-01-31 DIAGNOSIS — D649 Anemia, unspecified: Secondary | ICD-10-CM | POA: Diagnosis present

## 2024-01-31 DIAGNOSIS — I119 Hypertensive heart disease without heart failure: Secondary | ICD-10-CM | POA: Diagnosis present

## 2024-01-31 DIAGNOSIS — Z79899 Other long term (current) drug therapy: Secondary | ICD-10-CM

## 2024-01-31 DIAGNOSIS — I10 Essential (primary) hypertension: Secondary | ICD-10-CM | POA: Diagnosis not present

## 2024-01-31 DIAGNOSIS — G40009 Localization-related (focal) (partial) idiopathic epilepsy and epileptic syndromes with seizures of localized onset, not intractable, without status epilepticus: Secondary | ICD-10-CM | POA: Diagnosis not present

## 2024-01-31 DIAGNOSIS — R5381 Other malaise: Secondary | ICD-10-CM | POA: Diagnosis present

## 2024-01-31 DIAGNOSIS — Y846 Urinary catheterization as the cause of abnormal reaction of the patient, or of later complication, without mention of misadventure at the time of the procedure: Secondary | ICD-10-CM | POA: Diagnosis present

## 2024-01-31 DIAGNOSIS — Z22322 Carrier or suspected carrier of Methicillin resistant Staphylococcus aureus: Secondary | ICD-10-CM

## 2024-01-31 DIAGNOSIS — N136 Pyonephrosis: Secondary | ICD-10-CM | POA: Diagnosis present

## 2024-01-31 DIAGNOSIS — Z6830 Body mass index (BMI) 30.0-30.9, adult: Secondary | ICD-10-CM

## 2024-01-31 DIAGNOSIS — E876 Hypokalemia: Secondary | ICD-10-CM | POA: Diagnosis present

## 2024-01-31 DIAGNOSIS — Z96643 Presence of artificial hip joint, bilateral: Secondary | ICD-10-CM | POA: Diagnosis present

## 2024-01-31 DIAGNOSIS — Z9049 Acquired absence of other specified parts of digestive tract: Secondary | ICD-10-CM

## 2024-01-31 DIAGNOSIS — Z960 Presence of urogenital implants: Secondary | ICD-10-CM | POA: Diagnosis present

## 2024-01-31 LAB — COMPREHENSIVE METABOLIC PANEL WITH GFR
ALT: 19 U/L (ref 0–44)
AST: 34 U/L (ref 15–41)
Albumin: 4 g/dL (ref 3.5–5.0)
Alkaline Phosphatase: 133 U/L — ABNORMAL HIGH (ref 38–126)
Anion gap: 10 (ref 5–15)
BUN: 9 mg/dL (ref 8–23)
CO2: 27 mmol/L (ref 22–32)
Calcium: 10 mg/dL (ref 8.9–10.3)
Chloride: 101 mmol/L (ref 98–111)
Creatinine, Ser: 0.91 mg/dL (ref 0.44–1.00)
GFR, Estimated: >60 mL/min (ref >60)
Glucose, Bld: 96 mg/dL (ref 70–99)
Potassium: 4 mmol/L (ref 3.5–5.1)
Sodium: 137 mmol/L (ref 135–145)
Total Bilirubin: 1.1 mg/dL (ref 0.0–1.2)
Total Protein: 8.5 g/dL — ABNORMAL HIGH (ref 6.5–8.1)

## 2024-01-31 LAB — CBC WITH DIFFERENTIAL/PLATELET
Abs Immature Granulocytes: 0.01 K/uL (ref 0.00–0.07)
Basophils Absolute: 0 K/uL (ref 0.0–0.1)
Basophils Relative: 1 %
Eosinophils Absolute: 0.1 K/uL (ref 0.0–0.5)
Eosinophils Relative: 1 %
HCT: 45.2 % (ref 36.0–46.0)
Hemoglobin: 14.5 g/dL (ref 12.0–15.0)
Immature Granulocytes: 0 %
Lymphocytes Relative: 23 %
Lymphs Abs: 1.3 K/uL (ref 0.7–4.0)
MCH: 30.1 pg (ref 26.0–34.0)
MCHC: 32.1 g/dL (ref 30.0–36.0)
MCV: 93.8 fL (ref 80.0–100.0)
Monocytes Absolute: 0.6 K/uL (ref 0.1–1.0)
Monocytes Relative: 10 %
Neutro Abs: 3.7 K/uL (ref 1.7–7.7)
Neutrophils Relative %: 65 %
Platelets: 199 K/uL (ref 150–400)
RBC: 4.82 MIL/uL (ref 3.87–5.11)
RDW: 14.4 % (ref 11.5–15.5)
WBC: 5.7 K/uL (ref 4.0–10.5)
nRBC: 0 % (ref 0.0–0.2)

## 2024-01-31 LAB — URINALYSIS, W/ REFLEX TO CULTURE (INFECTION SUSPECTED)
Bilirubin Urine: NEGATIVE
Glucose, UA: NEGATIVE mg/dL
Ketones, ur: NEGATIVE mg/dL
Nitrite: NEGATIVE
Protein, ur: 100 mg/dL — AB
RBC / HPF: >50 RBC/hpf (ref 0–5)
Specific Gravity, Urine: 1.011 (ref 1.005–1.030)
pH: 7 (ref 5.0–8.0)

## 2024-01-31 LAB — PROTIME-INR
INR: 1 (ref 0.8–1.2)
Prothrombin Time: 14 s (ref 11.4–15.2)

## 2024-01-31 LAB — I-STAT CG4 LACTIC ACID, ED: Lactic Acid, Venous: 1.1 mmol/L (ref 0.5–1.9)

## 2024-01-31 MED ORDER — ENOXAPARIN SODIUM 40 MG/0.4ML IJ SOSY: 40.0000 mg | PREFILLED_SYRINGE | INTRAMUSCULAR | Status: DC

## 2024-01-31 MED ORDER — HYDRALAZINE HCL 25 MG PO TABS
25.0000 mg | ORAL_TABLET | Freq: Three times a day (TID) | ORAL | Status: DC
  Administered 2024-01-31 – 2024-02-09 (×24): 25 mg via ORAL
  Filled 2024-01-31 (×27): qty 1

## 2024-01-31 MED ORDER — PROCHLORPERAZINE EDISYLATE 10 MG/2ML IJ SOLN: 5.0000 mg | INTRAMUSCULAR | Status: DC | PRN

## 2024-01-31 MED ORDER — IRBESARTAN 150 MG PO TABS
300.0000 mg | ORAL_TABLET | Freq: Every day | ORAL | Status: DC
  Administered 2024-02-01 – 2024-02-09 (×9): 300 mg via ORAL
  Filled 2024-01-31: qty 2
  Filled 2024-01-31 (×2): qty 1
  Filled 2024-01-31 (×5): qty 2
  Filled 2024-01-31: qty 1
  Filled 2024-01-31 (×2): qty 2
  Filled 2024-01-31: qty 1
  Filled 2024-01-31: qty 2

## 2024-01-31 MED ORDER — LAMOTRIGINE 25 MG PO TABS
25.0000 mg | ORAL_TABLET | Freq: Two times a day (BID) | ORAL | Status: DC
  Administered 2024-02-01 – 2024-02-09 (×17): 25 mg via ORAL
  Filled 2024-01-31 (×17): qty 1

## 2024-01-31 MED ORDER — LEVETIRACETAM 500 MG PO TABS
500.0000 mg | ORAL_TABLET | Freq: Two times a day (BID) | ORAL | Status: DC
  Administered 2024-02-01 – 2024-02-09 (×17): 500 mg via ORAL
  Filled 2024-01-31 (×17): qty 1

## 2024-01-31 MED ORDER — LAMOTRIGINE 25 MG PO TABS
25.0000 mg | ORAL_TABLET | Freq: Once | ORAL | Status: AC
  Administered 2024-01-31: 25 mg via ORAL
  Filled 2024-01-31: qty 1

## 2024-01-31 MED ORDER — SODIUM CHLORIDE 0.9 % IV SOLN
1.0000 g | Freq: Once | INTRAVENOUS | Status: AC
  Administered 2024-01-31: 1 g via INTRAVENOUS
  Filled 2024-01-31: qty 10

## 2024-01-31 MED ORDER — SODIUM CHLORIDE 0.9 % IV SOLN
1.0000 g | INTRAVENOUS | Status: DC
  Administered 2024-02-01 – 2024-02-04 (×4): 1 g via INTRAVENOUS
  Filled 2024-01-31 (×5): qty 10

## 2024-01-31 MED ORDER — POLYVINYL ALCOHOL 1.4 % OP SOLN
2.0000 [drp] | OPHTHALMIC | Status: DC | PRN
  Administered 2024-02-02: 2 [drp] via OPHTHALMIC
  Filled 2024-01-31: qty 15

## 2024-01-31 MED ORDER — DIVALPROEX SODIUM 250 MG PO DR TAB
250.0000 mg | DELAYED_RELEASE_TABLET | Freq: Three times a day (TID) | ORAL | Status: DC
  Administered 2024-02-01 – 2024-02-09 (×25): 250 mg via ORAL
  Filled 2024-01-31 (×25): qty 1

## 2024-01-31 MED ORDER — LACOSAMIDE 50 MG PO TABS
50.0000 mg | ORAL_TABLET | Freq: Two times a day (BID) | ORAL | Status: DC
  Administered 2024-02-01 – 2024-02-09 (×17): 50 mg via ORAL
  Filled 2024-01-31 (×17): qty 1

## 2024-01-31 MED ORDER — SENNOSIDES-DOCUSATE SODIUM 8.6-50 MG PO TABS
1.0000 | ORAL_TABLET | Freq: Every evening | ORAL | Status: DC | PRN
  Administered 2024-02-05: 1 via ORAL
  Filled 2024-01-31: qty 1

## 2024-01-31 MED ORDER — LEVETIRACETAM (KEPPRA) 500 MG/5 ML ADULT IV PUSH
500.0000 mg | Freq: Once | INTRAVENOUS | Status: AC
  Administered 2024-01-31: 500 mg via INTRAVENOUS
  Filled 2024-01-31: qty 5

## 2024-01-31 MED ORDER — ACETAMINOPHEN 325 MG PO TABS
650.0000 mg | ORAL_TABLET | Freq: Four times a day (QID) | ORAL | Status: DC | PRN
  Administered 2024-02-03: 650 mg via ORAL
  Filled 2024-01-31: qty 2

## 2024-01-31 MED ORDER — SODIUM CHLORIDE 0.9 % IV SOLN: INTRAVENOUS | Status: DC

## 2024-01-31 MED ORDER — IOHEXOL 300 MG/ML  SOLN
100.0000 mL | Freq: Once | INTRAMUSCULAR | Status: AC | PRN
  Administered 2024-01-31: 100 mL via INTRAVENOUS

## 2024-01-31 MED ORDER — SODIUM CHLORIDE 0.9 % IV SOLN
50.0000 mg | Freq: Once | INTRAVENOUS | Status: AC
  Administered 2024-01-31: 50 mg via INTRAVENOUS
  Filled 2024-01-31: qty 5

## 2024-01-31 MED ORDER — SODIUM CHLORIDE 0.9% FLUSH
3.0000 mL | Freq: Two times a day (BID) | INTRAVENOUS | Status: DC
  Administered 2024-02-01 – 2024-02-09 (×16): 3 mL via INTRAVENOUS

## 2024-01-31 MED ORDER — CHLORHEXIDINE GLUCONATE CLOTH 2 % EX PADS
6.0000 | MEDICATED_PAD | Freq: Every day | CUTANEOUS | Status: DC
  Administered 2024-01-31 – 2024-02-09 (×9): 6 via TOPICAL

## 2024-01-31 MED ORDER — ACETAMINOPHEN 650 MG RE SUPP: 650.0000 mg | Freq: Four times a day (QID) | RECTAL | Status: DC | PRN

## 2024-01-31 MED ORDER — VALPROATE SODIUM 100 MG/ML IV SOLN
250.0000 mg | Freq: Once | INTRAVENOUS | Status: AC
  Administered 2024-01-31: 250 mg via INTRAVENOUS
  Filled 2024-01-31: qty 2.5

## 2024-01-31 MED ORDER — LORAZEPAM 1 MG PO TABS
1.0000 mg | ORAL_TABLET | Freq: Once | ORAL | Status: DC
  Filled 2024-01-31: qty 1

## 2024-01-31 NOTE — ED Notes (Signed)
 Pt daughter left and stated  they can give me a call if they need anything

## 2024-01-31 NOTE — ED Notes (Signed)
 Unable to collect second set b/c of access.

## 2024-01-31 NOTE — ED Triage Notes (Signed)
 BIB EMS from [facility] urology. Was there for a f/u. Surgery was 3 weeks ago - now having blood in catheter bag. Started having emesis at the appt x3. No pain. Only c/o nausea.

## 2024-01-31 NOTE — H&P (Signed)
 History and Physical    Alicia Brennan [name] DOB: [birth year] DOA: 01/31/2024  PCP: [name], MD   Patient coming from: SNF   Chief Complaint: gross hematuria, N/V, suprapubic pain, chills   HPI: Alicia Brennan is a 73 y.o. female with medical history significant for hypertension, vascular dementia, seizure disorder, and left hydroureteronephrosis status post ureteral stent placement on 01/03/2024 now presenting with gross hematuria, nausea, vomiting, suprapubic pain, and chills.  Patient is accompanied by her daughter who helps with the history.  She had been in her usual state until yesterday evening when she developed suprapubic discomfort, nausea with recurrent episodes of nonbloody vomiting, and noticed gross blood from her Foley catheter.  She was taken to see urology today but was lethargic at the time and directed to the ED.  Mental status has since returned to baseline but she continues to have nausea, chills, and suprapubic pain.  ED Course: Upon arrival to the ED, patient is found to be afebrile and saturating well on room air with normal HR and stable BP.  Labs are notable for normal renal function, normal CBC, normal lactic acid, and UA with bacteriuria, pyuria, and hematuria.  There are no acute findings on CT of the abdomen and pelvis.  Foley catheter was replaced in the ED, blood culture was collected, and the patient was treated with Rocephin .  Review of Systems:  All other systems reviewed and apart from HPI, are negative.  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   Hypertension    IBS (irritable bowel syndrome)    IBS (irritable bowel syndrome)    Leg fracture, left    Recurrent headache    Sarcoidosis of lung    Seizures (HCC)    Vascular dementia (HCC) 01/31/2024    Past Surgical History:  Procedure Laterality Date   BILATERAL ANTERIOR TOTAL HIP ARTHROPLASTY Bilateral 11/15/2015   Procedure:  BILATERAL ANTERIOR TOTAL HIP ARTHROPLASTY;  Surgeon: Redell Shoals, MD;  Location: [facility] OR;  Service: Orthopedics;  Laterality: Bilateral;   CARPAL TUNNEL RELEASE Bilateral    CHOLECYSTECTOMY     COLONOSCOPY     CYSTOSCOPY W/ URETERAL STENT PLACEMENT Left 01/03/2024   Procedure: CYSTOSCOPY, WITH RETROGRADE PYELOGRAM AND URETERAL STENT INSERTION;  Surgeon: Devere Lonni Righter, MD;  Location: [facility] ORS;  Service: Urology;  Laterality: Left;   EYE SURGERY     r/t sarcoidisis   ORIF ELBOW FRACTURE Left    TIBIA FRACTURE SURGERY Left     Social History:   reports that she has never smoked. She has never used smokeless tobacco. She reports that she does not drink alcohol and does not use drugs.  No Known Allergies  Family History  Problem Relation Age of Onset   Dementia Mother    High blood pressure Father    Dementia Maternal Aunt    Sarcoidosis Son    Seizures Neg Hx      Prior to Admission medications   Medication Sig Start Date End Date Taking? Authorizing Provider  divalproex  (DEPAKOTE ) 250 MG DR tablet Take 1 tablet (250 mg total) by mouth 3 (three) times daily. Patient taking differently: Take 250 mg by mouth See admin instructions. Take 250 mg by mouth at 6 AM, 2 PM, and 10 PM 03/25/18   Harris, Abigail, PA-C  feeding supplement (ENSURE PLUS HIGH PROTEIN) LIQD Take 237 mLs by mouth 2 (two) times daily between meals. 01/10/24   Lue,  Elsie BROCKS, MD  furosemide  (LASIX ) 40 MG tablet Take 1 tablet (40 mg total) by mouth daily. 12/11/23   Odell Celinda Balo, MD  hydrALAZINE  (APRESOLINE ) 25 MG tablet Take 1 tablet (25 mg total) by mouth every 8 (eight) hours. Patient taking differently: Take 25 mg by mouth See admin instructions. Take 25 mg by mouth every eight hours and hold for a Systolic reading less than 100, Diastolic reading less than 90, or a heart rate less than 60 12/11/23   Odell Celinda Balo, MD  lacosamide  (VIMPAT ) 50 MG TABS tablet Take 1 tablet (50 mg total) by  mouth 2 (two) times daily. 01/10/24   Lue Elsie BROCKS, MD  lactulose  (CHRONULAC ) 10 GM/15ML solution Take 15 mLs (10 g total) by mouth daily. 12/11/23   Odell Celinda Balo, MD  lamoTRIgine  (LAMICTAL ) 25 MG tablet Take 25 mg by mouth 2 (two) times daily. 11/19/23 01/30/24  [provider]  levETIRAcetam  (KEPPRA ) 500 MG tablet Take 1 tablet (500 mg total) by mouth 2 (two) times daily. 12/11/23   Odell Celinda Balo, MD  NAYZILAM  5 MG/0.1ML SOLN GIVE ONE SPRAY INTO 1 NOSTRIL. IF NO RESPONSE IN 10 MINUTES, MAY GIVE SECOND SPRAY IN OTHER NOSTRIL. DO NOT GIVE SECOND DOSE IF PATIENT HAS TROUBLE BREATHING OR EXCESSIVE SEDATION, MAX 2 DOSES/SEIZURE EPISODE, 1 EPISODE EVERY 3 DAYS OR 5 EPISODES/MONTH Nasal for 6 Days 09/24/23   [provider]  telmisartan (MICARDIS) 80 MG tablet Take 80 mg by mouth daily. 12/04/22   [provider]    Physical Exam: Vitals:   01/31/24 1603 01/31/24 1927  BP: (!) 170/71 (!) 174/73  Pulse: 68 65  Resp: 16 15  Temp: 97.9 F (36.6 C) 98.4 F (36.9 C)  TempSrc: Oral Oral  SpO2: 98% 99%    Constitutional: NAD, no pallor or diaphoresis   Eyes: PERTLA, lids and conjunctivae normal ENMT: Mucous membranes are moist. Posterior pharynx clear of any exudate or lesions.   Neck: supple, no masses  Respiratory: no wheezing, no crackles. No accessory muscle use.  Cardiovascular: S1 & S2 heard, regular rate and rhythm. Pretibial pitting edema.  Abdomen: No tenderness, soft. Bowel sounds active.  Musculoskeletal: no clubbing / cyanosis. No joint deformity upper and lower extremities.   Skin: no significant rashes, lesions, ulcers. Warm, dry, well-perfused. Neurologic: CN 2-12 grossly intact. Moving all extremities. Alert and oriented to person, place, and situation.  Psychiatric: Calm. Cooperative.    Labs and Imaging on Admission: I have personally reviewed following labs and imaging studies  CBC: Recent Labs  Lab 01/31/24 1800  WBC 5.7   NEUTROABS 3.7  HGB 14.5  HCT 45.2  MCV 93.8  PLT 199   Basic Metabolic Panel: Recent Labs  Lab 01/31/24 1800  NA 137  K 4.0  CL 101  CO2 27  GLUCOSE 96  BUN 9  CREATININE 0.91  CALCIUM 10.0   GFR: CrCl cannot be calculated (Unknown ideal weight.). Liver Function Tests: Recent Labs  Lab 01/31/24 1800  AST 34  ALT 19  ALKPHOS 133*  BILITOT 1.1  PROT 8.5*  ALBUMIN  4.0   No results for input(s): LIPASE, AMYLASE in the last 168 hours. No results for input(s): AMMONIA in the last 168 hours. Coagulation Profile: Recent Labs  Lab 01/31/24 1800  INR 1.0   Cardiac Enzymes: No results for input(s): CKTOTAL, CKMB, CKMBINDEX, TROPONINI in the last 168 hours. BNP (last 3 results) Recent Labs    12/06/23 1658  PROBNP 64.3  HbA1C: No results for input(s): HGBA1C in the last 72 hours. CBG: No results for input(s): GLUCAP in the last 168 hours. Lipid Profile: No results for input(s): CHOL, HDL, LDLCALC, TRIG, CHOLHDL, LDLDIRECT in the last 72 hours. Thyroid Function Tests: No results for input(s): TSH, T4TOTAL, FREET4, T3FREE, THYROIDAB in the last 72 hours. Anemia Panel: No results for input(s): VITAMINB12, FOLATE, FERRITIN, TIBC, IRON, RETICCTPCT in the last 72 hours. Urine analysis:    Component Value Date/Time   COLORURINE YELLOW 01/31/2024 1838   APPEARANCEUR CLOUDY (A) 01/31/2024 1838   APPEARANCEUR Clear 05/17/2013 1525   LABSPEC 1.011 01/31/2024 1838   LABSPEC 1.019 05/17/2013 1525   PHURINE 7.0 01/31/2024 1838   GLUCOSEU NEGATIVE 01/31/2024 1838   GLUCOSEU Negative 05/17/2013 1525   HGBUR LARGE (A) 01/31/2024 1838   BILIRUBINUR NEGATIVE 01/31/2024 1838   BILIRUBINUR Negative 05/17/2013 1525   KETONESUR NEGATIVE 01/31/2024 1838   PROTEINUR 100 (A) 01/31/2024 1838   NITRITE NEGATIVE 01/31/2024 1838   LEUKOCYTESUR TRACE (A) 01/31/2024 1838   LEUKOCYTESUR Trace 05/17/2013 1525   Sepsis  Labs: @LABRCNTIP (procalcitonin:4,lacticidven:4) )No results found for this or any previous visit (from the past 240 hours).   Radiological Exams on Admission: CT ABDOMEN PELVIS W CONTRAST Result Date: 01/31/2024 EXAM: CT ABDOMEN AND PELVIS WITH CONTRAST 01/31/2024 07:25:30 PM TECHNIQUE: CT of the abdomen and pelvis was performed with the administration of 100 mL of iohexol (OMNIPAQUE) 300 MG/ML solution. Multiplanar reformatted images are provided for review. Automated exposure control, iterative reconstruction, and/or weight-based adjustment of the mA/kV was utilized to reduce the radiation dose to as low as reasonably achievable. COMPARISON: Comparison with 01/02/2024. CLINICAL HISTORY: seen for hydronephrosis and managed with foley. increasing blood in foley. No stone seen the last time. FINDINGS: LOWER CHEST: Small hiatal hernia. LIVER: Hepatic steatosis. GALLBLADDER AND BILE DUCTS: Cholecystectomy. No biliary ductal dilatation. SPLEEN: No acute abnormality. PANCREAS: No acute abnormality. ADRENAL GLANDS: No acute abnormality. KIDNEYS, URETERS AND BLADDER: Interval placement of a left ureteral stent. The previous left hydroureteronephrosis has resolved. No stones in the kidneys or ureters. No perinephric or periureteral stranding. Foley catheter in the bladder. Small amount of gas in the bladder. The bladder is not well evaluated due to streak artifact from the bilateral hip arthroplasties. GI AND BOWEL: Stomach demonstrates no acute abnormality. There is no bowel obstruction. PERITONEUM AND RETROPERITONEUM: Small volume pelvic ascites. No free air. VASCULATURE: Aorta is normal in caliber. LYMPH NODES: No lymphadenopathy. REPRODUCTIVE ORGANS: No acute abnormality. BONES AND SOFT TISSUES: Bilateral hip arthroplasties. No acute osseous abnormality. No focal soft tissue abnormality. IMPRESSION: 1. Interval placement of a left ureteral stent with resolution of previous left hydroureteronephrosis. 2. Small  amount of gas in the bladder, likely related to instrumentation. Foley catheter in place. Bladder evaluation limited by streak artifact from bilateral hip arthroplasties. Electronically signed by: Norman Gatlin MD 01/31/2024 07:33 PM EST RP Workstation: [id number]   DG Chest Port 1 View Result Date: 01/31/2024 CLINICAL DATA:  Possible sepsis EXAM: PORTABLE CHEST 1 VIEW COMPARISON:  01/02/2024 FINDINGS: Enlarged cardiomediastinal silhouette with aortic atherosclerosis. No focal opacity, pleural effusion, or pneumothorax IMPRESSION: No active disease. Cardiomegaly. Electronically Signed   By: Luke Bun M.D.   On: 01/31/2024 17:20    EKG: Independently reviewed. Sinus rhythm.   Assessment/Plan   1. Acute UTI; gross hematuria  - Not septic on admission; no gross blood in catheter bag in ED  - Continue Rocephin , follow cultures and clinical course   2. Nausea, vomiting  -  Exam is benign and no acute findings on CT in ED  - Likely secondary UTI, continue UTI treatment and supportive care  3. Seizure disorder  - Continue Keppra , Depakote , Vimpat , and Lamictal , use IV formulations where possible tonight and reassess tolerance of PO in am    4. Hypertension  - Continue hydralazine  and ARB    5. Dementia  - Use delirium precautions     DVT prophylaxis: SCDs Code Status: Full  Level of Care: Level of care: Med-Surg Family Communication: Daughter at bedside  Disposition Plan:  Patient is from: SNF  Anticipated d/c is to: SNF  Anticipated d/c date is: 11/7 or 02/02/24  Patient currently: Pending tolerance of adequate oral intake  Consults called: None  Admission status: Observation     Evalene GORMAN Sprinkles, MD [facility]  01/31/2024, 8:35 PM

## 2024-01-31 NOTE — ED Provider Notes (Signed)
 [place] [facility]ERGENCY DEPARTMENT AT [facility] Provider Note   CSN: [id number] Arrival date & time: 01/31/24  1557     Patient presents with: Hematuria   Alicia Brennan is a 73 y.o. female.    Hematuria    Patient presents because of hematuria.  Endorsing lower abdominal pain as well.  Had episode of emesis yesterday as well as today.  Endorses chills but no documented fevers.  Started noticing blood in her Foley cath yesterday.  This has not had this exchanged and she was initially placed.  No chest pain or shortness of breath.  No hematemesis or hematochezia that she is aware.  Otherwise, denies all complaints.   Previous medical history reviewed : Patient was discharged on January 10, 2024.  Past medical history includes breast dementia, seizure disorder.  Breakthrough seizure.  Acute encephalopathy.  Complicated UTI.     Prior to Admission medications   Medication Sig Start Date End Date Taking? Authorizing Provider  divalproex  (DEPAKOTE ) 250 MG DR tablet Take 1 tablet (250 mg total) by mouth 3 (three) times daily. Patient taking differently: Take 250 mg by mouth See admin instructions. Take 250 mg by mouth at 6 AM, 2 PM, and 10 PM 03/25/18   Harris, Abigail, PA-C  feeding supplement (ENSURE PLUS HIGH PROTEIN) LIQD Take 237 mLs by mouth 2 (two) times daily between meals. 01/10/24   Lue Elsie BROCKS, MD  furosemide  (LASIX ) 40 MG tablet Take 1 tablet (40 mg total) by mouth daily. 12/11/23   Odell Celinda Balo, MD  hydrALAZINE  (APRESOLINE ) 25 MG tablet Take 1 tablet (25 mg total) by mouth every 8 (eight) hours. Patient taking differently: Take 25 mg by mouth See admin instructions. Take 25 mg by mouth every eight hours and hold for a Systolic reading less than 100, Diastolic reading less than 90, or a heart rate less than 60 12/11/23   Odell Celinda Balo, MD  lacosamide  (VIMPAT ) 50 MG TABS tablet Take 1 tablet (50 mg total) by mouth 2 (two) times daily. 01/10/24    Lue Elsie BROCKS, MD  lactulose  (CHRONULAC ) 10 GM/15ML solution Take 15 mLs (10 g total) by mouth daily. 12/11/23   Odell Celinda Balo, MD  lamoTRIgine  (LAMICTAL ) 25 MG tablet Take 25 mg by mouth 2 (two) times daily. 11/19/23 01/30/24  [provider]  levETIRAcetam  (KEPPRA ) 500 MG tablet Take 1 tablet (500 mg total) by mouth 2 (two) times daily. 12/11/23   Odell Celinda Balo, MD  NAYZILAM  5 MG/0.1ML SOLN GIVE ONE SPRAY INTO 1 NOSTRIL. IF NO RESPONSE IN 10 MINUTES, MAY GIVE SECOND SPRAY IN OTHER NOSTRIL. DO NOT GIVE SECOND DOSE IF PATIENT HAS TROUBLE BREATHING OR EXCESSIVE SEDATION, MAX 2 DOSES/SEIZURE EPISODE, 1 EPISODE EVERY 3 DAYS OR 5 EPISODES/MONTH Nasal for 6 Days 09/24/23   [provider]  telmisartan (MICARDIS) 80 MG tablet Take 80 mg by mouth daily. 12/04/22   [provider]    Allergies: Patient has no known allergies.    Review of Systems  Genitourinary:  Positive for hematuria.    Updated Vital Signs BP (!) 168/77 (BP Location: Right Arm)   Pulse 63   Temp 97.9 F (36.6 C) (Oral)   Resp 18   SpO2 99%   Physical Exam Vitals and nursing note reviewed.  Constitutional:      General: She is not in acute distress.    Appearance: She is well-developed.  HENT:     Head: Normocephalic and atraumatic.  Eyes:  Conjunctiva/sclera: Conjunctivae normal.  Cardiovascular:     Rate and Rhythm: Normal rate and regular rhythm.     Heart sounds: No murmur heard. Pulmonary:     Effort: Pulmonary effort is normal. No respiratory distress.     Breath sounds: Normal breath sounds.  Abdominal:     Palpations: Abdomen is soft.     Tenderness: There is no abdominal tenderness.  Musculoskeletal:        General: No swelling.     Cervical back: Neck supple.  Skin:    General: Skin is warm and dry.     Capillary Refill: Capillary refill takes less than 2 seconds.  Neurological:     Mental Status: She is alert.  Psychiatric:        Mood and Affect: Mood  normal.     (all labs ordered are listed, but only abnormal results are displayed) Labs Reviewed  COMPREHENSIVE METABOLIC PANEL WITH GFR - Abnormal; Notable for the following components:      Result Value   Total Protein 8.5 (*)    Alkaline Phosphatase 133 (*)    All other components within normal limits  URINALYSIS, W/ REFLEX TO CULTURE (INFECTION SUSPECTED) - Abnormal; Notable for the following components:   APPearance CLOUDY (*)    Hgb urine dipstick LARGE (*)    Protein, ur 100 (*)    Leukocytes,Ua TRACE (*)    Bacteria, UA RARE (*)    All other components within normal limits  CULTURE, BLOOD (ROUTINE X 2)  CULTURE, BLOOD (ROUTINE X 2)  URINE CULTURE  CBC WITH DIFFERENTIAL/PLATELET  PROTIME-INR  BASIC METABOLIC PANEL WITH GFR  MAGNESIUM  CBC  I-STAT CG4 LACTIC ACID, ED    EKG: EKG Interpretation Date/Time:  Thursday January 31 2024 19:00:30 EST Ventricular Rate:  63 PR Interval:  200 QRS Duration:  100 QT Interval:  481 QTC Calculation: 493 R Axis:   22  Text Interpretation: Sinus rhythm Borderline prolonged QT interval Confirmed by Simon Rea [phone]) on 01/31/2024 7:44:01 PM  Radiology: CT ABDOMEN PELVIS W CONTRAST Result Date: 01/31/2024 EXAM: CT ABDOMEN AND PELVIS WITH CONTRAST 01/31/2024 07:25:30 PM TECHNIQUE: CT of the abdomen and pelvis was performed with the administration of 100 mL of iohexol (OMNIPAQUE) 300 MG/ML solution. Multiplanar reformatted images are provided for review. Automated exposure control, iterative reconstruction, and/or weight-based adjustment of the mA/kV was utilized to reduce the radiation dose to as low as reasonably achievable. COMPARISON: Comparison with 01/02/2024. CLINICAL HISTORY: seen for hydronephrosis and managed with foley. increasing blood in foley. No stone seen the last time. FINDINGS: LOWER CHEST: Small hiatal hernia. LIVER: Hepatic steatosis. GALLBLADDER AND BILE DUCTS: Cholecystectomy. No biliary ductal dilatation. SPLEEN:  No acute abnormality. PANCREAS: No acute abnormality. ADRENAL GLANDS: No acute abnormality. KIDNEYS, URETERS AND BLADDER: Interval placement of a left ureteral stent. The previous left hydroureteronephrosis has resolved. No stones in the kidneys or ureters. No perinephric or periureteral stranding. Foley catheter in the bladder. Small amount of gas in the bladder. The bladder is not well evaluated due to streak artifact from the bilateral hip arthroplasties. GI AND BOWEL: Stomach demonstrates no acute abnormality. There is no bowel obstruction. PERITONEUM AND RETROPERITONEUM: Small volume pelvic ascites. No free air. VASCULATURE: Aorta is normal in caliber. LYMPH NODES: No lymphadenopathy. REPRODUCTIVE ORGANS: No acute abnormality. BONES AND SOFT TISSUES: Bilateral hip arthroplasties. No acute osseous abnormality. No focal soft tissue abnormality. IMPRESSION: 1. Interval placement of a left ureteral stent with resolution of previous left hydroureteronephrosis. 2.  Small amount of gas in the bladder, likely related to instrumentation. Foley catheter in place. Bladder evaluation limited by streak artifact from bilateral hip arthroplasties. Electronically signed by: Norman Gatlin MD 01/31/2024 07:33 PM EST RP Workstation: [id number]   DG Chest Port 1 View Result Date: 01/31/2024 CLINICAL DATA:  Possible sepsis EXAM: PORTABLE CHEST 1 VIEW COMPARISON:  01/02/2024 FINDINGS: Enlarged cardiomediastinal silhouette with aortic atherosclerosis. No focal opacity, pleural effusion, or pneumothorax IMPRESSION: No active disease. Cardiomegaly. Electronically Signed   By: Luke Bun M.D.   On: 01/31/2024 17:20     Procedures   Medications Ordered in the ED  hydrALAZINE  (APRESOLINE ) tablet 25 mg (has no administration in time range)  irbesartan  (AVAPRO ) tablet 300 mg (has no administration in time range)  divalproex  (DEPAKOTE ) DR tablet 250 mg (has no administration in time range)  valproate (DEPACON) 250 mg in  dextrose  5 % 50 mL IVPB (has no administration in time range)  lacosamide  (VIMPAT ) tablet 50 mg (has no administration in time range)  lamoTRIgine  (LAMICTAL ) tablet 25 mg (has no administration in time range)  levETIRAcetam  (KEPPRA ) tablet 500 mg (has no administration in time range)  sodium chloride  flush (NS) 0.9 % injection 3 mL (has no administration in time range)  acetaminophen  (TYLENOL ) tablet 650 mg (has no administration in time range)    Or  acetaminophen  (TYLENOL ) suppository 650 mg (has no administration in time range)  senna-docusate (Senokot-S) tablet 1 tablet (has no administration in time range)  prochlorperazine (COMPAZINE) injection 5 mg (has no administration in time range)  cefTRIAXone  (ROCEPHIN ) 1 g in sodium chloride  0.9 % 100 mL IVPB (has no administration in time range)  Chlorhexidine  Gluconate Cloth 2 % PADS 6 each (has no administration in time range)  0.9 %  sodium chloride  infusion (has no administration in time range)  artificial tears ophthalmic solution 2 drop (has no administration in time range)  iohexol (OMNIPAQUE) 300 MG/ML solution 100 mL (100 mLs Intravenous Contrast Given 01/31/24 1908)  cefTRIAXone  (ROCEPHIN ) 1 g in sodium chloride  0.9 % 100 mL IVPB (1 g Intravenous New Bag/Given 01/31/24 2023)  levETIRAcetam  (KEPPRA ) undiluted injection 500 mg (500 mg Intravenous Given 01/31/24 2016)  lacosamide  (VIMPAT ) 50 mg in sodium chloride  0.9 % 25 mL IVPB (50 mg Intravenous New Bag/Given 01/31/24 2131)                                    Medical Decision Making Amount and/or Complexity of Data Reviewed Labs: ordered. Radiology: ordered.  Risk Prescription drug management. Decision regarding hospitalization.     HPI:    Patient presents because of hematuria.  Endorsing lower abdominal pain as well.  Had episode of emesis yesterday as well as today.  Endorses chills but no documented fevers.  Started noticing blood in her Foley cath yesterday.  This has  not had this exchanged and she was initially placed.  No chest pain or shortness of breath.  No hematemesis or hematochezia that she is aware.  Otherwise, denies all complaints.   Previous medical history reviewed : Patient was discharged on January 10, 2024.  Past medical history includes breast dementia, seizure disorder.  Breakthrough seizure.  Acute encephalopathy.  Complicated UTI.   MDM:    Upon exam, patient hemodynamic stable.  Maps appropriate.  Afebrile.  Pain to palpation lower abdomen.  Patient has Foley catheter in place.  Noticed some hematuria no Foley catheter at  bedside.  Laboratory workup.  Will obtain CT scan as well to rule out, evidence of nephrolithiasis.  Will assess for any, evidence of obstruction as well given the nausea and vomiting.   Reevaluation:   Upon reexamination, patient hemodynamically stable.  Remains A&O x 3 with GCS 15.   UA consistent for UTI.  This is obtained from a clean cath.  Cath was exchanged prior to obtaining this UA.  Culture will be sent.  Started patient on ceftriaxone  for this given that last urine grew out E. coli and sensitive to ceftriaxone .  CT scan shows improvement of hydronephrosis on the left side.  Could not really evaluate actual bladder for any kind of inflammatory changes.  Reviewed patient's last admission.  Patient presented for very similar presentation.  Did not have any kind of leukocytosis that time.  Was coming in with nausea vomiting and had the same UA ended up growing out E. coli.  Given nausea and vomiting as well as Foley cath, patient will be placed in admission.  I did give her one-time dose of IV Keppra  500 mg given unable to tolerate p.o. inpatient seizure history.   Interventions: ceftriaxone , keppra ,  EKG Interpreted by Me: nsr    Cardiac Tele Interpreted by Me: nsr   I have independently interpreted the CXR  and CT  images and agree with the radiologist finding   Social Determinant of Health:  Lives at facility   Disposition and Follow Up: admit       Final diagnoses:  Urinary tract infection due to indwelling urinary catheter, initial encounter  Nausea and vomiting in adult    ED Discharge Orders     None          Simon Lavonia SAILOR, MD 01/31/24 2211

## 2024-02-01 ENCOUNTER — Other Ambulatory Visit: Payer: Self-pay

## 2024-02-01 ENCOUNTER — Other Ambulatory Visit: Payer: Self-pay | Admitting: Urology

## 2024-02-01 DIAGNOSIS — R112 Nausea with vomiting, unspecified: Secondary | ICD-10-CM | POA: Diagnosis not present

## 2024-02-01 DIAGNOSIS — E66811 Obesity, class 1: Secondary | ICD-10-CM | POA: Diagnosis present

## 2024-02-01 DIAGNOSIS — I119 Hypertensive heart disease without heart failure: Secondary | ICD-10-CM | POA: Diagnosis present

## 2024-02-01 DIAGNOSIS — Z9049 Acquired absence of other specified parts of digestive tract: Secondary | ICD-10-CM | POA: Diagnosis not present

## 2024-02-01 DIAGNOSIS — I10 Essential (primary) hypertension: Secondary | ICD-10-CM | POA: Diagnosis not present

## 2024-02-01 DIAGNOSIS — F0150 Vascular dementia without behavioral disturbance: Secondary | ICD-10-CM | POA: Diagnosis present

## 2024-02-01 DIAGNOSIS — Z96643 Presence of artificial hip joint, bilateral: Secondary | ICD-10-CM | POA: Diagnosis present

## 2024-02-01 DIAGNOSIS — T83511D Infection and inflammatory reaction due to indwelling urethral catheter, subsequent encounter: Secondary | ICD-10-CM | POA: Diagnosis not present

## 2024-02-01 DIAGNOSIS — G40009 Localization-related (focal) (partial) idiopathic epilepsy and epileptic syndromes with seizures of localized onset, not intractable, without status epilepticus: Secondary | ICD-10-CM | POA: Diagnosis present

## 2024-02-01 DIAGNOSIS — R319 Hematuria, unspecified: Secondary | ICD-10-CM | POA: Diagnosis present

## 2024-02-01 DIAGNOSIS — Y846 Urinary catheterization as the cause of abnormal reaction of the patient, or of later complication, without mention of misadventure at the time of the procedure: Secondary | ICD-10-CM | POA: Diagnosis present

## 2024-02-01 DIAGNOSIS — T83511A Infection and inflammatory reaction due to indwelling urethral catheter, initial encounter: Secondary | ICD-10-CM | POA: Diagnosis present

## 2024-02-01 DIAGNOSIS — E876 Hypokalemia: Secondary | ICD-10-CM | POA: Diagnosis present

## 2024-02-01 DIAGNOSIS — Z6830 Body mass index (BMI) 30.0-30.9, adult: Secondary | ICD-10-CM | POA: Diagnosis not present

## 2024-02-01 DIAGNOSIS — R310 Gross hematuria: Secondary | ICD-10-CM | POA: Diagnosis present

## 2024-02-01 DIAGNOSIS — L89156 Pressure-induced deep tissue damage of sacral region: Secondary | ICD-10-CM | POA: Diagnosis present

## 2024-02-01 DIAGNOSIS — Z79899 Other long term (current) drug therapy: Secondary | ICD-10-CM | POA: Diagnosis not present

## 2024-02-01 DIAGNOSIS — D860 Sarcoidosis of lung: Secondary | ICD-10-CM | POA: Diagnosis present

## 2024-02-01 DIAGNOSIS — Z751 Person awaiting admission to adequate facility elsewhere: Secondary | ICD-10-CM | POA: Diagnosis not present

## 2024-02-01 DIAGNOSIS — N390 Urinary tract infection, site not specified: Secondary | ICD-10-CM | POA: Diagnosis present

## 2024-02-01 DIAGNOSIS — Z22322 Carrier or suspected carrier of Methicillin resistant Staphylococcus aureus: Secondary | ICD-10-CM | POA: Diagnosis not present

## 2024-02-01 DIAGNOSIS — R5381 Other malaise: Secondary | ICD-10-CM | POA: Diagnosis present

## 2024-02-01 DIAGNOSIS — Z960 Presence of urogenital implants: Secondary | ICD-10-CM | POA: Diagnosis present

## 2024-02-01 DIAGNOSIS — N136 Pyonephrosis: Secondary | ICD-10-CM | POA: Diagnosis present

## 2024-02-01 DIAGNOSIS — D649 Anemia, unspecified: Secondary | ICD-10-CM | POA: Diagnosis present

## 2024-02-01 LAB — BASIC METABOLIC PANEL WITH GFR
Anion gap: 11 (ref 5–15)
BUN: 8 mg/dL (ref 8–23)
CO2: 22 mmol/L (ref 22–32)
Calcium: 9.1 mg/dL (ref 8.9–10.3)
Chloride: 104 mmol/L (ref 98–111)
Creatinine, Ser: 0.72 mg/dL (ref 0.44–1.00)
GFR, Estimated: >60 mL/min (ref >60)
Glucose, Bld: 91 mg/dL (ref 70–99)
Potassium: 3.3 mmol/L — ABNORMAL LOW (ref 3.5–5.1)
Sodium: 137 mmol/L (ref 135–145)

## 2024-02-01 LAB — CBC
HCT: 39.6 % (ref 36.0–46.0)
Hemoglobin: 12.5 g/dL (ref 12.0–15.0)
MCH: 30.1 pg (ref 26.0–34.0)
MCHC: 31.6 g/dL (ref 30.0–36.0)
MCV: 95.4 fL (ref 80.0–100.0)
Platelets: 174 K/uL (ref 150–400)
RBC: 4.15 MIL/uL (ref 3.87–5.11)
RDW: 14.5 % (ref 11.5–15.5)
WBC: 5.2 K/uL (ref 4.0–10.5)
nRBC: 0 % (ref 0.0–0.2)

## 2024-02-01 LAB — MAGNESIUM: Magnesium: 2 mg/dL (ref 1.7–2.4)

## 2024-02-01 MED ORDER — POTASSIUM CHLORIDE 10 MEQ/100ML IV SOLN
10.0000 meq | Freq: Once | INTRAVENOUS | Status: AC
  Administered 2024-02-01: 10 meq via INTRAVENOUS

## 2024-02-01 MED ORDER — POTASSIUM CHLORIDE 10 MEQ/100ML IV SOLN
10.0000 meq | INTRAVENOUS | Status: AC
  Administered 2024-02-01 (×2): 10 meq via INTRAVENOUS
  Filled 2024-02-01 (×3): qty 100

## 2024-02-01 MED ORDER — SODIUM CHLORIDE 0.9 % IV SOLN: INTRAVENOUS | Status: AC

## 2024-02-01 NOTE — TOC Initial Note (Addendum)
 Transition of Care [facility]) - Initial/Assessment Note    Patient Details  Name: Alicia Brennan MRN: [id number] Date of Birth: 1950-08-24  Transition of [facility]) CM/SW Contact:    Alfonse JONELLE Rex, RN Phone Number: 02/01/2024, 11:09 AM  Clinical Narrative:   Per Yolande with [facility], patient went to Urology appointment and was admitted from specialist office ( per Kia, patient had been issued a NOMNC on 01/31/24). Admitted for UTI and N/V, has a hx of Dementia.      -11:18pm Call to patient's dtr,a [name], to introduce role of TOC/NCM and review for dc planning, no answer, vm left with NCM name and phone number requesting call back to review for dc planning.  TOC will continue to follow               Patient Goals and CMS Choice            Expected Discharge Plan and Services                                              Prior Living Arrangements/Services                       Activities of Daily Living   ADL Screening (condition at time of admission) Independently performs ADLs?: No Does the patient have a NEW difficulty with bathing/dressing/toileting/self-feeding that is expected to last >3 days?: No Does the patient have a NEW difficulty with getting in/out of bed, walking, or climbing stairs that is expected to last >3 days?: No Does the patient have a NEW difficulty with communication that is expected to last >3 days?: No Is the patient deaf or have difficulty hearing?: No Does the patient have difficulty seeing, even when wearing glasses/contacts?: No Does the patient have difficulty concentrating, remembering, or making decisions?: Yes  Permission Sought/Granted                  Emotional Assessment              Admission diagnosis:  UTI (urinary tract infection) [N39.0] Patient Active Problem List   Diagnosis Date Noted   UTI (urinary tract infection) 01/31/2024   Vascular dementia (HCC) 01/31/2024   Gross hematuria  01/31/2024   Nausea & vomiting 01/31/2024   Breakthrough seizure (HCC) 01/02/2024   Complicated UTI (urinary tract infection) 01/02/2024   Hydronephrosis 01/02/2024   AKI (acute kidney injury) 01/02/2024   Acute encephalopathy 12/07/2023   Encephalopathy 12/06/2023   Seizure (HCC) 09/12/2023   Osteoarthritis of both hips 11/15/2015   Avascular necrosis of bones of both hips (HCC) 11/15/2015   Solitary pulmonary nodule 05/03/2015   Localization-related (focal) (partial) idiopathic epilepsy and epileptic syndromes with seizures of localized onset, not intractable, without status epilepticus (HCC) 04/27/2015   Generalized tonic-clonic seizure (HCC) 04/25/2015   Cancer (HCC)    Sarcoidosis of lung    Hypertension    Recurrent headache    PCP:  [name], Edra GRADE, MD Pharmacy:  No Pharmacies Listed    Social Drivers of Health (SDOH) Social History: SDOH Screenings   Food Insecurity: Food Insecurity Present (01/31/2024)  Housing: Low Risk  (01/31/2024)  Transportation Needs: No Transportation Needs (01/31/2024)  Utilities: Not At Risk (01/31/2024)  Financial Resource Strain: Low Risk  (11/16/2023)   Received [facility] [place]  Social Connections:  Moderately Isolated (01/31/2024)  Tobacco Use: Low Risk  (01/31/2024)   SDOH Interventions:     Readmission Risk Interventions     No data to display

## 2024-02-01 NOTE — Consult Note (Addendum)
 WOC Nurse Consult Note:  WOC consult performed remotely utilizing imaging and chart review Reason for Consult:wound care  Wound type:  Sacrum deep tissue pressure injury with scattered scabs- evolving Pressure Injury POA: Yes Measurement: see nursing flow sheets Wound bed: deep purple maroon discoloration with scattered scabs in lateral aspect of wound Drainage (amount, consistency, odor) see nursing flow sheets Periwound: intact Dressing procedure/placement/frequency:   Cleanse with NS, pat dry.  Apply xeroform to wound bed and cover with silicone foam dressing.  Change daily.       WOC team will not follow at this time, please re consult if new needs arise.  Thank you,  Doyal Polite, MSN, RN, [facility] WOC Team [phone] (Available Mon-Fri 0700-1500)

## 2024-02-01 NOTE — Progress Notes (Signed)
 [facility]                                                                              Alicia Brennan, is a 73 y.o. female, DOB - 10-31-1950, [name] Admit date - 01/31/2024    Outpatient Primary MD for the patient is [name], Edra GRADE, MD  LOS - 0  days  Chief Complaint  Patient presents with   Hematuria       Brief summary   Patient is a 73 year old female with HTN, vascular dementia, seizure disorder, left hydroureteronephrosis status post ureteral stent placement on 10/9 presented with nausea vomiting, suprapubic pain, fever chills and gross hematuria.  In ED, patient reported being in her usual state of health until a day before the admission when she developed suprapubic discomfort, nausea with recurrent episodes of nonbloody vomiting.  She also noticed gross blood from her Foley catheter.  She was taken to see urology but due to lethargy, was directed to ED.  Mental status had improved but she continued to have nausea, chills and suprapubic pain. UA positive.  No acute findings on CT abdomen and pelvis Foley catheter was replaced in ED, urine culture, blood cultures were obtained. Patient was admitted for further workup  Assessment & Plan     UTI (urinary tract infection) with hematuria -Not septic on admission, hematuria has cleared.  Foley catheter replaced in ED. - Follow urine culture and symptom days, blood cultures - Continue IV Rocephin   Nausea and vomiting -Likely due to #1, currently feeling better - No active nausea and vomiting, will advance diet to full liquids -Continue IV fluids until tolerating diet  Seizure disorder -Continue Keppra , Depakote , Vimpat , Lamictal   Hypertension - Continue Avapro , hydralazine   Dementia -Currently appears close to her baseline mental status, alert and awake, follow delirium precautions    Pressure Injury Documentation: Wound 01/03/24 1814 Pressure Injury Buttocks Right;Left Stage 2 -   Partial thickness loss of dermis presenting as a shallow open injury with a red, pink wound bed without slough. (Active)  - Wound care following Obesity class I  Estimated body mass index is 30.41 kg/m as calculated from the following:   Height as of this encounter: 5' 5 (1.651 m).   Weight as of this encounter: 82.9 kg.  Code Status: Full code DVT Prophylaxis:  SCDs Start: 01/31/24 2037   Level of Care: Level of care: Med-Surg Family Communication: Updated patient Disposition Plan:      Remains inpatient appropriate:      Procedures:    Consultants:     Antimicrobials:   Anti-infectives (From admission, onward)    Start     Dose/Rate Route Frequency Ordered Stop   02/01/24 2000  cefTRIAXone  (ROCEPHIN ) 1 g in sodium chloride  0.9 % 100 mL IVPB        1 g 200 mL/hr over 30 Minutes Intravenous Every 24 hours 01/31/24 2020     01/31/24 2000  cefTRIAXone  (ROCEPHIN ) 1 g in sodium chloride  0.9 % 100 mL IVPB        1 g 200 mL/hr over 30 Minutes Intravenous  Once 01/31/24 1950 01/31/24 2053  Medications  Chlorhexidine  Gluconate Cloth  6 each Topical Daily   divalproex   250 mg Oral TID   hydrALAZINE   25 mg Oral Q8H   irbesartan   300 mg Oral Daily   lacosamide   50 mg Oral BID   lamoTRIgine   25 mg Oral BID   levETIRAcetam   500 mg Oral BID   sodium chloride  flush  3 mL Intravenous Q12H      Subjective:   Alicia Brennan was seen and examined today.  Feeling better, no active nausea or vomiting, hematuria cleared.  No acute abdominal pain.  Ordering CLD breakfast on the phone.  No fevers. Objective:   Vitals:   02/01/24 0526 02/01/24 0528 02/01/24 0739 02/01/24 0950  BP: (!) 140/61 (!) 140/61 (!) 127/56 (!) 129/57  Pulse:  66 68 68  Resp:  18  16  Temp:  98 F (36.7 C) 98 F (36.7 C) (!) 97.5 F (36.4 C)  TempSrc:  Oral Oral Oral  SpO2:  97% 97% 98%  Weight:      Height:        Intake/Output Summary (Last 24 hours) at 02/01/2024 1033 Last data  filed at 02/01/2024 0600 Gross per 24 hour  Intake 739.95 ml  Output 800 ml  Net -60.05 ml     Wt Readings from Last 3 Encounters:  01/31/24 82.9 kg  01/04/24 83.9 kg  12/06/23 83.7 kg     Exam General: Alert and oriented, NAD Cardiovascular: S1 S2 auscultated,  RRR Respiratory: Clear to auscultation bilaterally Gastrointestinal: Soft, nontender, nondistended, + bowel sounds Ext: Trace pedal edema bilaterally Neuro: No new deficits Psych: Normal affect  GU: Foley catheter, clear, no hematuria    Data Reviewed:  I have personally reviewed following labs    CBC Lab Results  Component Value Date   WBC 5.2 02/01/2024   RBC 4.15 02/01/2024   HGB 12.5 02/01/2024   HCT 39.6 02/01/2024   MCV 95.4 02/01/2024   MCH 30.1 02/01/2024   PLT 174 02/01/2024   MCHC 31.6 02/01/2024   RDW 14.5 02/01/2024   LYMPHSABS 1.3 01/31/2024   MONOABS 0.6 01/31/2024   EOSABS 0.1 01/31/2024   BASOSABS 0.0 01/31/2024     Last metabolic panel Lab Results  Component Value Date   NA 137 02/01/2024   K 3.3 (L) 02/01/2024   CL 104 02/01/2024   CO2 22 02/01/2024   BUN 8 02/01/2024   CREATININE 0.72 02/01/2024   GLUCOSE 91 02/01/2024   GFRNONAA >60 02/01/2024   GFRAA >60 08/16/2019   CALCIUM 9.1 02/01/2024   PROT 8.5 (H) 01/31/2024   ALBUMIN  4.0 01/31/2024   BILITOT 1.1 01/31/2024   ALKPHOS 133 (H) 01/31/2024   AST 34 01/31/2024   ALT 19 01/31/2024   ANIONGAP 11 02/01/2024    CBG (last 3)  No results for input(s): GLUCAP in the last 72 hours.    Coagulation Profile: Recent Labs  Lab 01/31/24 1800  INR 1.0     Radiology Studies: I have personally reviewed the imaging studies  CT ABDOMEN PELVIS W CONTRAST Result Date: 01/31/2024 EXAM: CT ABDOMEN AND PELVIS WITH CONTRAST 01/31/2024 07:25:30 PM TECHNIQUE: CT of the abdomen and pelvis was performed with the administration of 100 mL of iohexol (OMNIPAQUE) 300 MG/ML solution. Multiplanar reformatted images are provided for  review. Automated exposure control, iterative reconstruction, and/or weight-based adjustment of the mA/kV was utilized to reduce the radiation dose to as low as reasonably achievable. COMPARISON: Comparison with 01/02/2024. CLINICAL HISTORY: seen for hydronephrosis  and managed with foley. increasing blood in foley. No stone seen the last time. FINDINGS: LOWER CHEST: Small hiatal hernia. LIVER: Hepatic steatosis. GALLBLADDER AND BILE DUCTS: Cholecystectomy. No biliary ductal dilatation. SPLEEN: No acute abnormality. PANCREAS: No acute abnormality. ADRENAL GLANDS: No acute abnormality. KIDNEYS, URETERS AND BLADDER: Interval placement of a left ureteral stent. The previous left hydroureteronephrosis has resolved. No stones in the kidneys or ureters. No perinephric or periureteral stranding. Foley catheter in the bladder. Small amount of gas in the bladder. The bladder is not well evaluated due to streak artifact from the bilateral hip arthroplasties. GI AND BOWEL: Stomach demonstrates no acute abnormality. There is no bowel obstruction. PERITONEUM AND RETROPERITONEUM: Small volume pelvic ascites. No free air. VASCULATURE: Aorta is normal in caliber. LYMPH NODES: No lymphadenopathy. REPRODUCTIVE ORGANS: No acute abnormality. BONES AND SOFT TISSUES: Bilateral hip arthroplasties. No acute osseous abnormality. No focal soft tissue abnormality. IMPRESSION: 1. Interval placement of a left ureteral stent with resolution of previous left hydroureteronephrosis. 2. Small amount of gas in the bladder, likely related to instrumentation. Foley catheter in place. Bladder evaluation limited by streak artifact from bilateral hip arthroplasties. Electronically signed by: Norman Gatlin MD 01/31/2024 07:33 PM EST RP Workstation: [id number]   DG Chest Port 1 View Result Date: 01/31/2024 CLINICAL DATA:  Possible sepsis EXAM: PORTABLE CHEST 1 VIEW COMPARISON:  01/02/2024 FINDINGS: Enlarged cardiomediastinal silhouette with aortic  atherosclerosis. No focal opacity, pleural effusion, or pneumothorax IMPRESSION: No active disease. Cardiomegaly. Electronically Signed   By: Luke Bun M.D.   On: 01/31/2024 17:20       Vauda Salvucci M.D. [facility] 02/01/2024, 10:33 AM  Available via Epic secure chat 7am-7pm After 7 pm, please refer to night coverage provider listed on amion.

## 2024-02-01 NOTE — Plan of Care (Signed)

## 2024-02-02 DIAGNOSIS — G40009 Localization-related (focal) (partial) idiopathic epilepsy and epileptic syndromes with seizures of localized onset, not intractable, without status epilepticus: Secondary | ICD-10-CM | POA: Diagnosis not present

## 2024-02-02 DIAGNOSIS — R310 Gross hematuria: Secondary | ICD-10-CM | POA: Diagnosis not present

## 2024-02-02 DIAGNOSIS — T83511A Infection and inflammatory reaction due to indwelling urethral catheter, initial encounter: Secondary | ICD-10-CM | POA: Diagnosis not present

## 2024-02-02 DIAGNOSIS — R112 Nausea with vomiting, unspecified: Secondary | ICD-10-CM | POA: Diagnosis not present

## 2024-02-02 LAB — CBC
HCT: 35.9 % — ABNORMAL LOW (ref 36.0–46.0)
Hemoglobin: 11.1 g/dL — ABNORMAL LOW (ref 12.0–15.0)
MCH: 29.4 pg (ref 26.0–34.0)
MCHC: 30.9 g/dL (ref 30.0–36.0)
MCV: 95.2 fL (ref 80.0–100.0)
Platelets: 150 K/uL (ref 150–400)
RBC: 3.77 MIL/uL — ABNORMAL LOW (ref 3.87–5.11)
RDW: 14.5 % (ref 11.5–15.5)
WBC: 4.4 K/uL (ref 4.0–10.5)
nRBC: 0 % (ref 0.0–0.2)

## 2024-02-02 LAB — BASIC METABOLIC PANEL WITH GFR
Anion gap: 9 (ref 5–15)
BUN: <5 mg/dL — ABNORMAL LOW (ref 8–23)
CO2: 22 mmol/L (ref 22–32)
Calcium: 8.6 mg/dL — ABNORMAL LOW (ref 8.9–10.3)
Chloride: 106 mmol/L (ref 98–111)
Creatinine, Ser: 0.64 mg/dL (ref 0.44–1.00)
GFR, Estimated: >60 mL/min (ref >60)
Glucose, Bld: 84 mg/dL (ref 70–99)
Potassium: 3.8 mmol/L (ref 3.5–5.1)
Sodium: 137 mmol/L (ref 135–145)

## 2024-02-02 LAB — MRSA NEXT GEN BY PCR, NASAL: MRSA by PCR Next Gen: DETECTED — AB

## 2024-02-02 LAB — URINE CULTURE: Culture: <10000 — AB

## 2024-02-02 MED ORDER — CHLORHEXIDINE GLUCONATE CLOTH 2 % EX PADS
6.0000 | MEDICATED_PAD | Freq: Every day | CUTANEOUS | Status: AC
  Administered 2024-02-02 – 2024-02-06 (×4): 6 via TOPICAL

## 2024-02-02 MED ORDER — MUPIROCIN 2 % EX OINT
1.0000 | TOPICAL_OINTMENT | Freq: Two times a day (BID) | CUTANEOUS | Status: AC
  Administered 2024-02-02 – 2024-02-07 (×10): 1 via NASAL
  Filled 2024-02-02 (×3): qty 22

## 2024-02-02 NOTE — Evaluation (Signed)
 Physical Therapy Evaluation Patient Details Name: Alicia Brennan MRN: [id number] DOB: Jul 17, 1950 Today's Date: 02/02/2024  History of Present Illness  73 y.o. female  presenting with gross hematuria, nausea, vomiting, suprapubic pain, and chills, admitted from urology office d/t letharg, dx with UTI. 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  Pt agreeable to OOB, overall mod assist for bed mobility and transfers, stedy used for bed to chair.  Patient will benefit from continued inpatient follow up therapy, <3 hours/day--return to SNF   Pt currently with functional limitations due to the deficits listed below (see PT Problem List). Pt will benefit from acute skilled PT to increase their independence and safety with mobility to allow discharge.           If plan is discharge home, recommend the following: A lot of help with bathing/dressing/bathroom;Assistance with cooking/housework;Direct supervision/assist for financial management;Direct supervision/assist for medications management;Help with stairs or ramp for entrance;Assist for transportation;A lot of help with walking and/or transfers   Can travel by private vehicle   No    Equipment Recommendations None recommended by PT  Recommendations for Other Services       Functional Status Assessment Patient has had a recent decline in their functional status and demonstrates the ability to make significant improvements in function in a reasonable and predictable amount of time.     Precautions / Restrictions Precautions Precautions: Fall Recall of Precautions/Restrictions: Impaired Restrictions Weight Bearing Restrictions Per Provider Order: No      Mobility  Bed Mobility Overal bed mobility: Needs Assistance Bed Mobility: Rolling, Sidelying to Sit Rolling: Mod  assist Sidelying to sit: Mod assist       General bed mobility comments: assist to advance LEs, raise trunk and pivot hips;    Transfers Overall transfer level: Needs assistance Equipment used: Rolling walker (2 wheels) Transfers: Sit to/from Stand, Bed to chair/wheelchair/BSC Sit to Stand: Via lift equipment, Mod assist, From elevated surface           General transfer comment: repeated STS x3. mod assist with anterior - superior wt shift. stedy used for bed  to chair transfer Transfer via Lift Equipment: Stedy  Ambulation/Gait                  Stairs            Wheelchair Mobility     Tilt Bed    Modified Rankin (Stroke Patients Only)       Balance Overall balance assessment: Needs assistance Sitting-balance support: Single extremity supported, Feet supported Sitting balance-Leahy Scale: Fair     Standing balance support: Reliant on assistive device for balance, Bilateral upper extremity supported, During functional activity Standing balance-Leahy Scale: Zero Standing balance comment: reliant on UE support and external assist                             Pertinent Vitals/Pain Pain Assessment Pain Assessment: No/denies pain    Home Living Family/patient expects to be discharged to:: Skilled nursing facility                   Additional Comments: pt admitted from [facility]    Prior Function Prior Level of Function : Patient poor historian/Family not available  Extremity/Trunk Assessment   Upper Extremity Assessment Upper Extremity Assessment: Generalized weakness    Lower Extremity Assessment Lower Extremity Assessment: Generalized weakness       Communication   Communication Communication: No apparent difficulties    Cognition Arousal: Alert Behavior During Therapy: WFL for tasks assessed/performed   PT - Cognitive impairments: Problem solving, Safety/Judgement, No  family/caregiver present to determine baseline, History of cognitive impairments, Memory, Initiation                         Following commands: Intact       Cueing Cueing Techniques: Tactile cues, Gestural cues, Verbal cues, Visual cues     General Comments      Exercises     Assessment/Plan    PT Assessment Patient needs continued PT services  PT Problem List Decreased strength;Decreased activity tolerance;Decreased balance;Decreased mobility;Decreased knowledge of use of DME       PT Treatment Interventions DME instruction;Gait training;Functional mobility training;Therapeutic activities;Therapeutic exercise;Patient/family education    PT Goals (Current goals can be found in the Care Plan section)  Acute Rehab PT Goals PT Goal Formulation: Patient unable to participate in goal setting Time For Goal Achievement: 02/16/24 Potential to Achieve Goals: Good    Frequency Min 3X/week     Co-evaluation               AM-PAC PT 6 Clicks Mobility  Outcome Measure Help needed turning from your back to your side while in a flat bed without using bedrails?: A Lot Help needed moving from lying on your back to sitting on the side of a flat bed without using bedrails?: A Lot Help needed moving to and from a bed to a chair (including a wheelchair)?: Total Help needed standing up from a chair using your arms (e.g., wheelchair or bedside chair)?: Total Help needed to walk in hospital room?: Total Help needed climbing 3-5 steps with a railing? : Total 6 Click Score: 8    End of Session Equipment Utilized During Treatment: Gait belt Activity Tolerance: Patient tolerated treatment well Patient left: in chair;with chair alarm set;with call bell/phone within reach   PT Visit Diagnosis: Muscle weakness (generalized) (M62.81);Other abnormalities of gait and mobility (R26.89);Difficulty in walking, not elsewhere classified (R26.2)    Time: 8799-8763 PT Time Calculation  (min) (ACUTE ONLY): 36 min   Charges:   PT Evaluation $PT Eval Low Complexity: 1 Low PT Treatments $Therapeutic Activity: 8-22 mins PT General Charges $$ ACUTE PT VISIT: 1 Visit         Sears Oran, PT  Acute Rehab Dept [facility]) [phone]  02/02/2024   [facility] 02/02/2024, 1:49 PM

## 2024-02-02 NOTE — Plan of Care (Signed)
  Problem: Education: Goal: Knowledge of General Education information will improve Description: Including pain rating scale, medication(s)/side effects and non-pharmacologic comfort measures Outcome: Progressing   Problem: Clinical Measurements: Goal: Ability to maintain clinical measurements within normal limits will improve Outcome: Progressing   Problem: Activity: Goal: Risk for activity intolerance will decrease Outcome: Progressing   Problem: Nutrition: Goal: Adequate nutrition will be maintained Outcome: Progressing   Problem: Elimination: Goal: Will not experience complications related to bowel motility Outcome: Progressing Goal: Will not experience complications related to urinary retention Outcome: Progressing   Problem: Pain Managment: Goal: General experience of comfort will improve and/or be controlled Outcome: Progressing   Problem: Safety: Goal: Ability to remain free from injury will improve Outcome: Progressing   Problem: Skin Integrity: Goal: Risk for impaired skin integrity will decrease Outcome: Progressing

## 2024-02-02 NOTE — TOC Progression Note (Addendum)
 Transition of Care [facility]) - Progression Note    Patient Details  Name: Alicia Brennan MRN: [id number] Date of Birth: [birth year]  Transition of Care [facility]) CM/SW Contact  Lorraine LILLETTE Fenton, [place] Phone Number: 02/02/2024, 3:27 PM  Clinical Narrative:     Harlan from MD asking if pt can return to SNF tomorrow.  Call to [facility]- [facility] explained that pt  pt is not able to return to facility because her insurance  provided her a letter stating that she no longer met the need for skilled care.  [facility] shared that [facility] can be requested again- but she has been recently denied in appeal.  [place] chat to MD.  CSW called daughter and left a HIPAA compliant VM stating we should discuss DC plan tomorrow or Monday. Also added that if [facility] is denied and pt DC ready she may have to DC home. ICM following.  Addendum: Daughter called back- frustrated and stating she won an appeal and that [facility] a ([facility]) and should be able to accept her back.  CSW asked for approvil information- daughter could not provide eventually hanging up and stating that her brother will call back. ICM following.    Barriers to Discharge: Continued Medical Work up               Expected Discharge Plan and Services                                               Social Drivers of Health (SDOH) Interventions SDOH Screenings   Food Insecurity: Food Insecurity Present (01/31/2024)  Housing: Low Risk  (01/31/2024)  Transportation Needs: No Transportation Needs (01/31/2024)  Utilities: Not At Risk (01/31/2024)  Financial Resource Strain: Low Risk  (11/16/2023)   [facility]  Social Connections: Moderately Isolated (01/31/2024)  Tobacco Use: Low Risk  (01/31/2024)    Readmission Risk Interventions     No data to display

## 2024-02-02 NOTE — Progress Notes (Signed)
 [facility]                                                                              Alicia Brennan, is a 73 y.o. 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  In ED, patient reported being in her usual state of health until a day before the admission when she developed suprapubic discomfort, nausea with recurrent episodes of nonbloody vomiting.  She also noticed gross blood from her Foley catheter.  She was taken to see urology but due to lethargy, was directed to ED.  Mental status had improved but she continued to have nausea, chills and suprapubic pain. UA positive.  No acute findings on CT abdomen and pelvis Foley catheter was replaced in ED, urine culture, blood cultures were obtained. Patient was admitted for further workup  Assessment & Plan     UTI (urinary tract infection) with hematuria -Not septic on admission, hematuria has cleared.  Foley catheter replaced in ED. - Hematuria resolved - Urine culture showed less than 10,000 colonies of insignificant growth - For now continue IV Rocephin   Nausea and vomiting -Likely due to #1, currently feeling better - No active nausea and vomiting, will advance diet to full liquids -Continue IV fluids until tolerating diet  Seizure disorder -Continue Keppra , Depakote , Vimpat , Lamictal   Hypertension - Continue Avapro , hydralazine   Dementia -Currently appears close to her baseline mental status, alert and awake, follow delirium precautions    Pressure Injury Documentation: Wound 01/03/24 1814  Pressure Injury Buttocks Right;Left Stage 2 -  Partial thickness loss of dermis presenting as a shallow open injury with a red, pink wound bed without slough. (Active)  - Wound care following Obesity class I  Estimated body mass index is 30.41 kg/m as calculated from the following:   Height as of this encounter: 5' 5 (1.651 m).   Weight as of this encounter: 82.9 kg.  Code Status: Full code DVT Prophylaxis:  SCDs Start: 01/31/24 2037   Level of Care: Level of care: Med-Surg Family Communication: Updated patient Disposition Plan:      Remains inpatient appropriate:   Medically stable, need PT evaluation, patient from SNF.  Will return back to SNF in a.m.    Procedures:    Consultants:     Antimicrobials:   Anti-infectives (From admission, onward)    Start     Dose/Rate Route Frequency Ordered Stop   02/01/24 2000  cefTRIAXone  (ROCEPHIN ) 1 g in sodium chloride  0.9 % 100 mL IVPB        1 g 200 mL/hr over 30 Minutes Intravenous Every 24 hours 01/31/24 2020     01/31/24 2000  cefTRIAXone  (ROCEPHIN ) 1 g in sodium chloride  0.9 % 100 mL IVPB  1 g 200 mL/hr over 30 Minutes Intravenous  Once 01/31/24 1950 01/31/24 2053          Medications  Chlorhexidine  Gluconate Cloth  6 each Topical Daily   divalproex   250 mg Oral TID   hydrALAZINE   25 mg Oral Q8H   irbesartan   300 mg Oral Daily   lacosamide   50 mg Oral BID   lamoTRIgine   25 mg Oral BID   levETIRAcetam   500 mg Oral BID   sodium chloride  flush  3 mL Intravenous Q12H      Subjective:   Alicia Brennan was seen and examined today.  No acute issues.  Diet advanced to solids.  No active nausea vomiting, chest pain, shortness of breath, fevers.  Hematuria resolved.    Objective:   Vitals:   02/01/24 1440 02/01/24 2138 02/02/24 0534 02/02/24 0919  BP: 129/68 137/67 133/69 (!) 140/69  Pulse: 70 71 64 65  Resp: 16 16 18    Temp: 97.9 F (36.6 C) 98.8 F (37.1 C) 98.4 F (36.9 C)   TempSrc: Oral Oral Axillary    SpO2: 98% 97% 96%   Weight:      Height:        Intake/Output Summary (Last 24 hours) at 02/02/2024 1323 Last data filed at 02/02/2024 1043 Gross per 24 hour  Intake 474.06 ml  Output 875 ml  Net -400.94 ml     Wt Readings from Last 3 Encounters:  01/31/24 82.9 kg  01/04/24 83.9 kg  12/06/23 83.7 kg    Physical Exam General: Alert and oriented, NAD Cardiovascular: S1 S2 clear, RRR.  Respiratory: CTAB, no wheezing Gastrointestinal: Soft, nontender, nondistended, NBS Ext: no pedal edema bilaterally Neuro: no new deficits GU: Foley+ no hematuria   Data Reviewed:  I have personally reviewed following labs    CBC Lab Results  Component Value Date   WBC 4.4 02/02/2024   RBC 3.77 (L) 02/02/2024   HGB 11.1 (L) 02/02/2024   HCT 35.9 (L) 02/02/2024   MCV 95.2 02/02/2024   MCH 29.4 02/02/2024   PLT 150 02/02/2024   MCHC 30.9 02/02/2024   RDW 14.5 02/02/2024   LYMPHSABS 1.3 01/31/2024   MONOABS 0.6 01/31/2024   EOSABS 0.1 01/31/2024   BASOSABS 0.0 01/31/2024     Last metabolic panel Lab Results  Component Value Date   NA 137 02/02/2024   K 3.8 02/02/2024   CL 106 02/02/2024   CO2 22 02/02/2024   BUN <5 (L) 02/02/2024   CREATININE 0.64 02/02/2024   GLUCOSE 84 02/02/2024   GFRNONAA >60 02/02/2024   GFRAA >60 08/16/2019   CALCIUM 8.6 (L) 02/02/2024   PROT 8.5 (H) 01/31/2024   ALBUMIN  4.0 01/31/2024   BILITOT 1.1 01/31/2024   ALKPHOS 133 (H) 01/31/2024   AST 34 01/31/2024   ALT 19 01/31/2024   ANIONGAP 9 02/02/2024    CBG (last 3)  No results for input(s): GLUCAP in the last 72 hours.    Coagulation Profile: Recent Labs  Lab 01/31/24 1800  INR 1.0     Radiology Studies: I have personally reviewed the imaging studies  CT ABDOMEN PELVIS W CONTRAST Result Date: 01/31/2024 EXAM: CT ABDOMEN AND PELVIS WITH CONTRAST 01/31/2024 07:25:30 PM TECHNIQUE: CT of the abdomen and pelvis was performed with the administration of 100 mL of iohexol (OMNIPAQUE)  300 MG/ML solution. Multiplanar reformatted images are provided for review. Automated exposure control, iterative reconstruction, and/or weight-based adjustment of the mA/kV was utilized to reduce the radiation dose to  as low as reasonably achievable. COMPARISON: Comparison with 01/02/2024. CLINICAL HISTORY: seen for hydronephrosis and managed with foley. increasing blood in foley. No stone seen the last time. FINDINGS: LOWER CHEST: Small hiatal hernia. LIVER: Hepatic steatosis. GALLBLADDER AND BILE DUCTS: Cholecystectomy. No biliary ductal dilatation. SPLEEN: No acute abnormality. PANCREAS: No acute abnormality. ADRENAL GLANDS: No acute abnormality. KIDNEYS, URETERS AND BLADDER: Interval placement of a left ureteral stent. The previous left hydroureteronephrosis has resolved. No stones in the kidneys or ureters. No perinephric or periureteral stranding. Foley catheter in the bladder. Small amount of gas in the bladder. The bladder is not well evaluated due to streak artifact from the bilateral hip arthroplasties. GI AND BOWEL: Stomach demonstrates no acute abnormality. There is no bowel obstruction. PERITONEUM AND RETROPERITONEUM: Small volume pelvic ascites. No free air. VASCULATURE: Aorta is normal in caliber. LYMPH NODES: No lymphadenopathy. REPRODUCTIVE ORGANS: No acute abnormality. BONES AND SOFT TISSUES: Bilateral hip arthroplasties. No acute osseous abnormality. No focal soft tissue abnormality. IMPRESSION: 1. Interval placement of a left ureteral stent with resolution of previous left hydroureteronephrosis. 2. Small amount of gas in the bladder, likely related to instrumentation. Foley catheter in place. Bladder evaluation limited by streak artifact from bilateral hip arthroplasties. Electronically signed by: Norman Gatlin MD 01/31/2024 07:33 PM EST RP Workstation: [id number]   DG Chest Port 1 View Result Date: 01/31/2024 CLINICAL DATA:  Possible sepsis EXAM: PORTABLE CHEST 1 VIEW COMPARISON:   01/02/2024 FINDINGS: Enlarged cardiomediastinal silhouette with aortic atherosclerosis. No focal opacity, pleural effusion, or pneumothorax IMPRESSION: No active disease. Cardiomegaly. Electronically Signed   By: Luke Bun M.D.   On: 01/31/2024 17:20       Delta Deshmukh M.D. [facility] 02/02/2024, 1:23 PM  Available via Epic secure chat 7am-7pm After 7 pm, please refer to night coverage provider listed on amion.

## 2024-02-03 DIAGNOSIS — R112 Nausea with vomiting, unspecified: Secondary | ICD-10-CM | POA: Diagnosis not present

## 2024-02-03 DIAGNOSIS — T83511A Infection and inflammatory reaction due to indwelling urethral catheter, initial encounter: Secondary | ICD-10-CM | POA: Diagnosis not present

## 2024-02-03 DIAGNOSIS — I10 Essential (primary) hypertension: Secondary | ICD-10-CM | POA: Diagnosis not present

## 2024-02-03 DIAGNOSIS — G40009 Localization-related (focal) (partial) idiopathic epilepsy and epileptic syndromes with seizures of localized onset, not intractable, without status epilepticus: Secondary | ICD-10-CM | POA: Diagnosis not present

## 2024-02-03 LAB — CBC
HCT: 35 % — ABNORMAL LOW (ref 36.0–46.0)
Hemoglobin: 10.9 g/dL — ABNORMAL LOW (ref 12.0–15.0)
MCH: 29.5 pg (ref 26.0–34.0)
MCHC: 31.1 g/dL (ref 30.0–36.0)
MCV: 94.9 fL (ref 80.0–100.0)
Platelets: 148 K/uL — ABNORMAL LOW (ref 150–400)
RBC: 3.69 MIL/uL — ABNORMAL LOW (ref 3.87–5.11)
RDW: 14.5 % (ref 11.5–15.5)
WBC: 4.2 K/uL (ref 4.0–10.5)
nRBC: 0 % (ref 0.0–0.2)

## 2024-02-03 LAB — BASIC METABOLIC PANEL WITH GFR
Anion gap: 8 (ref 5–15)
BUN: 9 mg/dL (ref 8–23)
CO2: 22 mmol/L (ref 22–32)
Calcium: 8.9 mg/dL (ref 8.9–10.3)
Chloride: 106 mmol/L (ref 98–111)
Creatinine, Ser: 0.71 mg/dL (ref 0.44–1.00)
GFR, Estimated: >60 mL/min (ref >60)
Glucose, Bld: 100 mg/dL — ABNORMAL HIGH (ref 70–99)
Potassium: 3.5 mmol/L (ref 3.5–5.1)
Sodium: 136 mmol/L (ref 135–145)

## 2024-02-03 NOTE — TOC Progression Note (Signed)
 Transition of Care [facility]) - Progression Note    Patient Details  Name: Aneka Fagerstrom MRN: [id number] Date of Birth: 1950/05/10  [facility]) CM/SW Contact  Lorraine LILLETTE Fenton, LCSW Phone Number: 02/03/2024, 10:45 AM  Clinical Narrative:     CSW received a call from daughter who stated brother was present to.  Daughter is very calm today in  speaking and demeanor than she was yesterday, more regulated. Daughter explained that she spoke to insurance company yesterday afternoon and the representative agreed to fax the appeal approval to [facility]. [facility] is family's first choice for pt to return. If [facility] cannot accept- patient would like [facility].  Daughter then shared a complaint that she will make to medicare regarding [facility] having asked her for a bed hold fee of $500. CSW encouraged her to think about her decisions as emotions can flare, and they are a business it could be a policy. Daughter replied that she will be making a complaint.   CSW agreed to reach out to [facility] then daughter stated that [facility] is her second choice.  Kia called CSW  back and explained that while working remotely she has no access to fax but in [place] port- pt still denied, she will follow-up on Monday. ICM following   Expected Discharge Plan: Skilled Nursing Facility Barriers to Discharge: Continued Medical Work up               Expected Discharge Plan and Services In-house Referral: Clinical Social Work   Post Acute Care Choice: Skilled Nursing Facility Living arrangements for the past 2 months: Skilled Nursing Facility, Single Family Home                           HH Arranged: Social Work           Social Drivers of Health (SDOH) Interventions SDOH Screenings   Food Insecurity: Food Insecurity Present (01/31/2024)  Housing: Low Risk  (01/31/2024)  Transportation Needs: No Transportation Needs (01/31/2024)  Utilities: Not At Risk (01/31/2024)  Financial Resource Strain: Low Risk  (11/16/2023)    Received from [facility]  Social Connections: Moderately Isolated (01/31/2024)  Tobacco Use: Low Risk  (01/31/2024)    Readmission Risk Interventions     No data to display

## 2024-02-03 NOTE — Progress Notes (Addendum)
 [facility]                                                                              Alicia Brennan, is a 73 y.o. 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  In ED, patient reported being in her usual state of health until a day before the admission when she developed suprapubic discomfort, nausea with recurrent episodes of nonbloody vomiting.  She also noticed gross blood from her Foley catheter.  She was taken to see urology but due to lethargy, was directed to ED.  Mental status had improved but she continued to have nausea, chills and suprapubic pain. UA positive.  No acute findings on CT abdomen and pelvis Foley catheter was replaced in ED, urine culture, blood cultures were obtained. Patient was admitted for further workup  Awaiting SNF  Assessment & Plan     UTI (urinary tract infection) with hematuria in the setting of indwelling Foley catheter - Not septic on admission, hematuria has cleared.  Foley catheter replaced in ED. - Hematuria resolved - Urine culture showed less than 10,000 colonies of insignificant growth - For now continue IV Rocephin   Nausea and vomiting - Resolved, tolerating solid diet   Seizure disorder -Continue Keppra , Depakote , Vimpat , Lamictal   Hypertension - Continue Avapro , hydralazine   Dementia -Currently appears close to her baseline mental status, alert and awake - Delirium precautions    Pressure Injury Documentation: Wound 01/03/24 1814 Pressure Injury Buttocks Right;Left Stage 2 -  Partial thickness loss  of dermis presenting as a shallow open injury with a red, pink wound bed without slough. (Active)  - Wound care following Obesity class I  Estimated body mass index is 30.41 kg/m as calculated from the following:   Height as of this encounter: 5' 5 (1.651 m).   Weight as of this encounter: 82.9 kg.  Code Status: Full code DVT Prophylaxis:  SCDs Start: 01/31/24 2037   Level of Care: Level of care: Med-Surg Family Communication: Updated patient Disposition Plan:      Remains inpatient appropriate:   PT evaluation recommended SNF, awaiting SNF bed   Procedures:    Consultants:     Antimicrobials:   Anti-infectives (From admission, onward)    Start     Dose/Rate Route Frequency Ordered Stop   02/01/24 2000  cefTRIAXone  (ROCEPHIN ) 1 g in sodium chloride  0.9 % 100 mL IVPB        1 g 200 mL/hr over 30 Minutes Intravenous Every 24 hours 01/31/24 2020     01/31/24 2000  cefTRIAXone  (ROCEPHIN ) 1 g in sodium chloride  0.9 % 100 mL IVPB        1 g 200 mL/hr over 30 Minutes Intravenous  Once 01/31/24  1950 01/31/24 2053          Medications  Chlorhexidine  Gluconate Cloth  6 each Topical Daily   Chlorhexidine  Gluconate Cloth  6 each Topical Daily   divalproex   250 mg Oral TID   hydrALAZINE   25 mg Oral Q8H   irbesartan   300 mg Oral Daily   lacosamide   50 mg Oral BID   lamoTRIgine   25 mg Oral BID   levETIRAcetam   500 mg Oral BID   mupirocin ointment  1 Application Nasal BID   sodium chloride  flush  3 mL Intravenous Q12H      Subjective:   Alicia Brennan was seen and examined today.  No acute issues, tolerating solid diet.  No active nausea vomiting, fever or chills.    Objective:   Vitals:   02/02/24 0919 02/02/24 1532 02/02/24 2130 02/03/24 0628  BP: (!) 140/69 (!) 144/75 (!) 160/69 (!) 144/59  Pulse: 65 70 66 64  Resp:   18 18  Temp:   98 F (36.7 C) 98 F (36.7 C)  TempSrc:   Oral Oral  SpO2:   97% 98%  Weight:      Height:        Intake/Output Summary  (Last 24 hours) at 02/03/2024 1219 Last data filed at 02/03/2024 1000 Gross per 24 hour  Intake 320 ml  Output 2500 ml  Net -2180 ml     Wt Readings from Last 3 Encounters:  01/31/24 82.9 kg  01/04/24 83.9 kg  12/06/23 83.7 kg   Physical Exam General: Alert and oriented x 3, NAD Cardiovascular: S1 S2 clear, RRR.  Respiratory: CTAB, no wheezing Gastrointestinal: Soft, nontender, nondistended, NBS Ext: no pedal edema bilaterally Psych: Normal affect  GU: Foley with no hematuria  Data Reviewed:  I have personally reviewed following labs    CBC Lab Results  Component Value Date   WBC 4.2 02/03/2024   RBC 3.69 (L) 02/03/2024   HGB 10.9 (L) 02/03/2024   HCT 35.0 (L) 02/03/2024   MCV 94.9 02/03/2024   MCH 29.5 02/03/2024   PLT 148 (L) 02/03/2024   MCHC 31.1 02/03/2024   RDW 14.5 02/03/2024   LYMPHSABS 1.3 01/31/2024   MONOABS 0.6 01/31/2024   EOSABS 0.1 01/31/2024   BASOSABS 0.0 01/31/2024     Last metabolic panel Lab Results  Component Value Date   NA 136 02/03/2024   K 3.5 02/03/2024   CL 106 02/03/2024   CO2 22 02/03/2024   BUN 9 02/03/2024   CREATININE 0.71 02/03/2024   GLUCOSE 100 (H) 02/03/2024   GFRNONAA >60 02/03/2024   GFRAA >60 08/16/2019   CALCIUM 8.9 02/03/2024   PROT 8.5 (H) 01/31/2024   ALBUMIN  4.0 01/31/2024   BILITOT 1.1 01/31/2024   ALKPHOS 133 (H) 01/31/2024   AST 34 01/31/2024   ALT 19 01/31/2024   ANIONGAP 8 02/03/2024    CBG (last 3)  No results for input(s): GLUCAP in the last 72 hours.    Coagulation Profile: Recent Labs  Lab 01/31/24 1800  INR 1.0     Radiology Studies: I have personally reviewed the imaging studies  No results found.      Nydia Distance M.D. [facility] 02/03/2024, 12:19 PM  Available via Epic secure chat 7am-7pm After 7 pm, please refer to night coverage provider listed on amion.

## 2024-02-03 NOTE — Plan of Care (Signed)

## 2024-02-03 NOTE — Plan of Care (Signed)
   Problem: Education: Goal: Knowledge of General Education information will improve Description: Including pain rating scale, medication(s)/side effects and non-pharmacologic comfort measures Outcome: Progressing   Problem: Nutrition: Goal: Adequate nutrition will be maintained Outcome: Progressing

## 2024-02-04 DIAGNOSIS — T83511D Infection and inflammatory reaction due to indwelling urethral catheter, subsequent encounter: Secondary | ICD-10-CM

## 2024-02-04 DIAGNOSIS — R112 Nausea with vomiting, unspecified: Secondary | ICD-10-CM | POA: Diagnosis not present

## 2024-02-04 DIAGNOSIS — I10 Essential (primary) hypertension: Secondary | ICD-10-CM | POA: Diagnosis not present

## 2024-02-04 DIAGNOSIS — F0150 Vascular dementia without behavioral disturbance: Secondary | ICD-10-CM | POA: Diagnosis not present

## 2024-02-04 LAB — BASIC METABOLIC PANEL WITH GFR
Anion gap: 7 (ref 5–15)
BUN: 7 mg/dL — ABNORMAL LOW (ref 8–23)
CO2: 22 mmol/L (ref 22–32)
Calcium: 8.8 mg/dL — ABNORMAL LOW (ref 8.9–10.3)
Chloride: 106 mmol/L (ref 98–111)
Creatinine, Ser: 0.74 mg/dL (ref 0.44–1.00)
GFR, Estimated: >60 mL/min (ref >60)
Glucose, Bld: 92 mg/dL (ref 70–99)
Potassium: 3.7 mmol/L (ref 3.5–5.1)
Sodium: 135 mmol/L (ref 135–145)

## 2024-02-04 LAB — CBC
HCT: 34.1 % — ABNORMAL LOW (ref 36.0–46.0)
Hemoglobin: 10.9 g/dL — ABNORMAL LOW (ref 12.0–15.0)
MCH: 30.4 pg (ref 26.0–34.0)
MCHC: 32 g/dL (ref 30.0–36.0)
MCV: 95.3 fL (ref 80.0–100.0)
Platelets: 152 K/uL (ref 150–400)
RBC: 3.58 MIL/uL — ABNORMAL LOW (ref 3.87–5.11)
RDW: 14.3 % (ref 11.5–15.5)
WBC: 3.8 K/uL — ABNORMAL LOW (ref 4.0–10.5)
nRBC: 0 % (ref 0.0–0.2)

## 2024-02-04 NOTE — TOC Progression Note (Signed)
 Transition of Care [facility]) - Progression Note    Patient Details  Name: Alicia Brennan MRN: [id number] Date of Birth: 06/28/1950  [facility] of [facility] CM/SW Contact  Alfonse JONELLE Rex, RN Phone Number: 02/04/2024, 12:17 PM  Clinical Narrative:   10:35am NCM called to patient's dtr, [name], reviewed PT recommendation for short term rehab/SNF, [name] agreeable. NCM reviewed with [name] that NCM had spoke with admit coordinator at [facility] and facility confirmed patient is able to return to [facility] , will be in her Medicare copay days. NCM explained copay days to [name] and that no matter what SNF patient chooses , she will be in her copay days. [name] states family does not have resources for copay days, also states there is an active pending application for patient for LTC Medicaid. NCM will extend SNF bed offer search per request of [name], await bed offers.     Expected Discharge Plan: Skilled Nursing Facility Barriers to Discharge: Continued Medical Work up               Expected Discharge Plan and Services In-house Referral: Clinical Social Work   Post Acute Care Choice: Skilled Nursing Facility Living arrangements for the past 2 months: Skilled Nursing Facility, Single Family Home                           HH Arranged: Social Work           Social Drivers of Health (SDOH) Interventions SDOH Screenings   Food Insecurity: Food Insecurity Present (01/31/2024)  Housing: Low Risk  (01/31/2024)  Transportation Needs: No Transportation Needs (01/31/2024)  Utilities: Not At Risk (01/31/2024)  Financial Resource Strain: Low Risk (11/16/2023)   Received from [facility]  Social Connections: Moderately Isolated (01/31/2024)  Tobacco Use: Low Risk  (01/31/2024)    Readmission Risk Interventions     No data to display

## 2024-02-04 NOTE — Evaluation (Signed)
 Occupational Therapy Evaluation Patient Details Name: Alicia Brennan MRN: [id number] DOB: 02-Jan-1951 Today's Date: 02/04/2024   History of Present Illness   73 y.o. female  presenting with gross hematuria, nausea, vomiting, suprapubic pain, and chills, admitted from urology office d/t letharg, dx with UTI. 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 Currently, patient presents with deficits outlined below (see OT Problem List for details) most significantly generalized muscle weakness, decreased cognition, activity tolerance and balance limiting BADL's and functional mobility performance. Patient will benefit from continued inpatient follow up therapy, <3 hours/day. Patient requires continued Acute care hospital level OT services to progress safety and functional performance and allow for discharge.       If plan is discharge home, recommend the following:   Two people to help with walking and/or transfers;A lot of help with bathing/dressing/bathroom;Assistance with cooking/housework;Direct supervision/assist for medications management;Direct supervision/assist for financial management;Assist for transportation;Help with stairs or ramp for entrance;Supervision due to cognitive status     Functional Status Assessment   Patient has had a recent decline in their functional status and demonstrates the ability to make significant improvements in function in a reasonable and predictable amount of time.     Equipment Recommendations   None recommended by OT      Precautions/Restrictions   Precautions Precautions: Fall Recall of Precautions/Restrictions: Impaired Restrictions Weight Bearing Restrictions Per Provider Order: No     Mobility Bed Mobility Overal bed mobility: Needs  Assistance Bed Mobility: Rolling, Sidelying to Sit Rolling: Min assist   Supine to sit: Min assist, HOB elevated, Used rails     General bed mobility comments: increased time and cues    Transfers Overall transfer level: Needs assistance Equipment used: Rolling walker (2 wheels), Ambulation equipment used Transfers: Sit to/from Stand, Bed to chair/wheelchair/BSC Sit to Stand: Mod assist, From elevated surface, +2 physical assistance, +2 safety/equipment           General transfer comment: STS from built up recliner x 4 for LB bathing post transfer with STEDY Transfer via Lift Equipment: Stedy    Balance Overall balance assessment: Needs assistance Sitting-balance support: Feet supported, No upper extremity supported Sitting balance-Leahy Scale: Fair     Standing balance support: Reliant on assistive device for balance, Bilateral upper extremity supported, During functional activity Standing balance-Leahy Scale: Poor Standing balance comment: reliant on UE support and external assist                           ADL either performed or assessed with clinical judgement   ADL Overall ADL's : Needs assistance/impaired Eating/Feeding: Set up;Sitting   Grooming: Wash/dry hands;Wash/dry face;Oral care;Brushing hair;Sitting;Set up   Upper Body Bathing: Minimal assistance;Sitting;Cueing for sequencing   Lower Body Bathing: Maximal assistance;Sit to/from stand;Cueing for sequencing;Cueing for safety Lower Body Bathing Details (indicate cue type and reason): decreased reach Upper Body Dressing : Minimal assistance;Sitting   Lower Body Dressing: Maximal assistance;+2 for physical assistance;+2 for safety/equipment;Sitting/lateral leans;Cueing for sequencing;Cueing for safety   Toilet Transfer: BSC/3in1   Toileting- Clothing Manipulation and Hygiene: Maximal assistance;Sitting/lateral lean       Functional mobility during ADLs: Maximal assistance;+2 for physical  assistance;+2 for safety/equipment;Rolling walker (2 wheels) General ADL Comments: decreased functional reach and STA with assist and support     Vision Baseline Vision/History: 0 No visual deficits Vision  Assessment?: No apparent visual deficits            Pertinent Vitals/Pain Pain Assessment Pain Assessment: No/denies pain     Extremity/Trunk Assessment Upper Extremity Assessment Upper Extremity Assessment: Right hand dominant;Generalized weakness   Lower Extremity Assessment Lower Extremity Assessment: Defer to PT evaluation       Communication Communication Communication: No apparent difficulties   Cognition Arousal: Alert Behavior During Therapy: WFL for tasks assessed/performed Cognition: History of cognitive impairments             OT - Cognition Comments: slow processing, A OX3-4; decreased STM, insight and safety                 Following commands: Intact       Cueing  General Comments   Cueing Techniques: Tactile cues;Gestural cues;Verbal cues;Visual cues  mild +1 B LE edema, no skin issues, fatigues in standing with assist           Home Living Family/patient expects to be discharged to:: Skilled nursing facility                                 Additional Comments: pt admitted from [facility]      Prior Functioning/Environment Prior Level of Function : Patient poor historian/Family not available                    OT Problem List: Decreased strength;Decreased activity tolerance;Impaired balance (sitting and/or standing);Decreased cognition;Decreased safety awareness;Obesity;Increased edema   OT Treatment/Interventions: Self-care/ADL training;Therapeutic exercise;Neuromuscular education;Energy conservation;DME and/or AE instruction;Therapeutic activities;Cognitive remediation/compensation;Patient/family education;Balance training      OT Goals(Current goals can be found in the care plan section)    Acute Rehab OT Goals Patient Stated Goal: to keep getting stronger OT Goal Formulation: With patient Time For Goal Achievement: 02/18/24 Potential to Achieve Goals: Fair ADL Goals Pt Will Perform Lower Body Bathing: with mod assist;with adaptive equipment;sit to/from stand Pt Will Perform Lower Body Dressing: with mod assist;sit to/from stand;with adaptive equipment Pt Will Transfer to Toilet: stand pivot transfer;bedside commode;with mod assist Pt Will Perform Toileting - Clothing Manipulation and hygiene: with mod assist;sitting/lateral leans Pt/caregiver will Perform Home Exercise Program: Increased strength;With Supervision;With written HEP provided;Both right and left upper extremity   OT Frequency:  Min 2X/week       AM-PAC OT 6 Clicks Daily Activity     Outcome Measure Help from another person eating meals?: A Little Help from another person taking care of personal grooming?: A Little Help from another person toileting, which includes using toliet, bedpan, or urinal?: A Lot Help from another person bathing (including washing, rinsing, drying)?: A Lot Help from another person to put on and taking off regular upper body clothing?: A Little Help from another person to put on and taking off regular lower body clothing?: A Lot 6 Click Score: 15   End of Session Equipment Utilized During Treatment: Gait belt;Rolling walker (2 wheels);Other (comment) (STEDY) Nurse Communication: Mobility status  Activity Tolerance: Patient tolerated treatment well Patient left: in chair;with call bell/phone within reach;with chair alarm set  OT Visit Diagnosis: Unsteadiness on feet (R26.81);Other abnormalities of gait and mobility (R26.89);Muscle weakness (generalized) (M62.81);Cognitive communication deficit (R41.841)                Time: 9059-8988 OT Time Calculation (min): 31 min Charges:  OT General Charges $OT Visit: 1 Visit OT Evaluation $OT Eval Low Complexity:  1 Low OT  Treatments $Self Care/Home Management : 8-22 mins  Ramondo Dietze OT/L Acute Rehabilitation Department  [phone]  02/04/2024, 12:49 PM

## 2024-02-04 NOTE — Progress Notes (Signed)
 [facility]                                                                               [name], is a 73 y.o. 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  In ED, patient reported being in her usual state of health until a day before the admission when she developed suprapubic discomfort, nausea with recurrent episodes of nonbloody vomiting.  She also noticed gross blood from her Foley catheter.  She was taken to see urology but due to lethargy, was directed to ED.  Mental status had improved but she continued to have nausea, chills and suprapubic pain. UA positive.  No acute findings on CT abdomen and pelvis Foley catheter was replaced in ED, urine culture, blood cultures were obtained. Patient was admitted for further workup   Awaiting SNF   Assessment & Plan    UTI in the setting of indwelling foley catheter.  Foley catheter replaced.  Urine cultures showing insignificant growth.  Complete 5 days of IV rocephin . Last day tomorrow.     Hypertension;  Optimal BP parameters.  Resume Avapro  and hydralazine .    Seizure disorder Resume Keppra  , depakote , Vimpat  and Lamictal .    Dementia:  No agitation.    Obesity Body mass index is 30.41 kg/m. Recommend outpatient follow up with PCP.     Hypokalemia Replaced. Repeat levels wnl.     Normocytic anemia Hemoglobin around 10.  Monitor.    Nausea and vomiting resolved.         RN Pressure Injury Documentation: Wound 01/03/24 1814 Pressure Injury Buttocks Right;Left Stage 2 -  Partial thickness loss of dermis presenting as a shallow open injury  with a red, pink wound bed without slough. (Active)  Wound care   Estimated body mass index is 30.41 kg/m as calculated from the following:   Height as of this encounter: 5' 5 (1.651 m).   Weight as of this encounter: 82.9 kg.  Code Status: full code.  DVT Prophylaxis:  SCDs Start: 01/31/24 2037   Level of Care: Level of care: Med-Surg Family Communication: none at bedside.   Disposition Plan:     Remains inpatient appropriate:  pending SNF.    Procedures:  None.   Consultants:   none.   Antimicrobials:   Anti-infectives (From admission, onward)    Start     Dose/Rate Route Frequency Ordered Stop   02/01/24 2000  cefTRIAXone  (ROCEPHIN ) 1 g in sodium chloride  0.9 % 100 mL IVPB        1 g 200 mL/hr over 30 Minutes Intravenous Every 24 hours 01/31/24 2020     01/31/24 2000  cefTRIAXone  (ROCEPHIN ) 1 g in sodium chloride  0.9 % 100 mL IVPB        1 g 200 mL/hr over 30 Minutes Intravenous  Once 01/31/24 1950  01/31/24 2053        Medications  Scheduled Meds:  Chlorhexidine  Gluconate Cloth  6 each Topical Daily   Chlorhexidine  Gluconate Cloth  6 each Topical Daily   divalproex   250 mg Oral TID   hydrALAZINE   25 mg Oral Q8H   irbesartan   300 mg Oral Daily   lacosamide   50 mg Oral BID   lamoTRIgine   25 mg Oral BID   levETIRAcetam   500 mg Oral BID   mupirocin ointment  1 Application Nasal BID   sodium chloride  flush  3 mL Intravenous Q12H   Continuous Infusions:  cefTRIAXone  (ROCEPHIN )  IV Stopped (02/03/24 2031)   PRN Meds:.acetaminophen  **OR** acetaminophen , artificial tears, prochlorperazine, senna-docusate    Subjective:   Alicia Brennan was seen and examined today.  Pt wants to know when she can be discharged. No new complaints.   Objective:   Vitals:   02/03/24 1245 02/03/24 2333 02/04/24 0546 02/04/24 1350  BP: 129/64 (!) 116/56 (!) 146/65 (!) 148/67  Pulse: 71 66 61 61  Resp: 16 18 18 16   Temp: 98.2 F (36.8 C) 98.8 F (37.1 C) 97.6 F (36.4 C)  97.8 F (36.6 C)  TempSrc: Oral   Oral  SpO2: 98% 96% 97%   Weight:      Height:        Intake/Output Summary (Last 24 hours) at 02/04/2024 1603 Last data filed at 02/04/2024 1300 Gross per 24 hour  Intake 580 ml  Output 550 ml  Net 30 ml   Filed Weights   01/31/24 2200  Weight: 82.9 kg     Exam General exam: Appears calm and comfortable  Respiratory system: Clear to auscultation. Respiratory effort normal. Cardiovascular system: S1 & S2 heard, RRR. No JVD,  Gastrointestinal system: Abdomen is nondistended, soft and nontender.  Central nervous system: Alert and oriented. Extremities: no pedal edema.  Skin: No rashes,  Psychiatry: Mood & affect appropriate.     Data Reviewed:  I have personally reviewed following labs and imaging studies   CBC Lab Results  Component Value Date   WBC 3.8 (L) 02/04/2024   RBC 3.58 (L) 02/04/2024   HGB 10.9 (L) 02/04/2024   HCT 34.1 (L) 02/04/2024   MCV 95.3 02/04/2024   MCH 30.4 02/04/2024   PLT 152 02/04/2024   MCHC 32.0 02/04/2024   RDW 14.3 02/04/2024   LYMPHSABS 1.3 01/31/2024   MONOABS 0.6 01/31/2024   EOSABS 0.1 01/31/2024   BASOSABS 0.0 01/31/2024     Last metabolic panel Lab Results  Component Value Date   NA 135 02/04/2024   K 3.7 02/04/2024   CL 106 02/04/2024   CO2 22 02/04/2024   BUN 7 (L) 02/04/2024   CREATININE 0.74 02/04/2024   GLUCOSE 92 02/04/2024   GFRNONAA >60 02/04/2024   GFRAA >60 08/16/2019   CALCIUM 8.8 (L) 02/04/2024   PROT 8.5 (H) 01/31/2024   ALBUMIN  4.0 01/31/2024   BILITOT 1.1 01/31/2024   ALKPHOS 133 (H) 01/31/2024   AST 34 01/31/2024   ALT 19 01/31/2024   ANIONGAP 7 02/04/2024    CBG (last 3)  No results for input(s): GLUCAP in the last 72 hours.    Coagulation Profile: Recent Labs  Lab 01/31/24 1800  INR 1.0     Radiology Studies: No results found.     Elgie Butter M.D. [facility] 02/04/2024, 4:03 PM  Available via Epic secure chat 7am-7pm After 7  pm, please refer to night coverage provider listed on amion.  ;

## 2024-02-05 DIAGNOSIS — E66811 Obesity, class 1: Secondary | ICD-10-CM | POA: Insufficient documentation

## 2024-02-05 DIAGNOSIS — R5381 Other malaise: Secondary | ICD-10-CM

## 2024-02-05 LAB — CULTURE, BLOOD (ROUTINE X 2)
Culture: NO GROWTH
Special Requests: ADEQUATE

## 2024-02-05 MED ORDER — FUROSEMIDE 40 MG PO TABS: 40.0000 mg | ORAL_TABLET | Freq: Every day | ORAL | Status: DC | PRN

## 2024-02-05 NOTE — Progress Notes (Addendum)
 PROGRESS NOTE    Alicia Brennan  [name] DOB: 03-Apr-1950 DOA: 01/31/2024 PCP: [name], MD  Subjective: Pt seen and examined. Initial foley catheter placed on 01-03-2024 during her initial operation for left ureter stone. She was discharge to SNF with foley catheter. Pt went to SNF after discharge.  She went to urology clinic but had N/V and was not seen in urology clinic but sent to ER for evaluation.  She was admitted for presumed UTI but her urine cx are negative. She completed 5 days of IV ABX with Rocephin .   No hx of recent urinary retention. Family wants pt to go to SNF.  Pt was living with her son Alicia Brennan and dtr-in-law Alicia Brennan prior to admission in October 2025.   Hospital Course: CC: sent from urology office for gross hematuria in foley catheter  HPI: Alicia Brennan is a 73 y.o. female with medical history significant for hypertension, vascular dementia, seizure disorder, and left hydroureteronephrosis status post ureteral stent placement on 01/03/2024 now presenting with gross hematuria, nausea, vomiting, suprapubic pain, and chills.   Patient is accompanied by her daughter who helps with the history.  She had been in her usual state until yesterday evening when she developed suprapubic discomfort, nausea with recurrent episodes of nonbloody vomiting, and noticed gross blood from her Foley catheter.  She was taken to see urology today but was lethargic at the time and directed to the ED.  Mental status has since returned to baseline but she continues to have nausea, chills, and suprapubic pain.   ED Course: Upon arrival to the ED, patient is found to be afebrile and saturating well on room air with normal HR and stable BP.  Labs are notable for normal renal function, normal CBC, normal lactic acid, and UA with bacteriuria, pyuria, and hematuria.  There are no acute findings on CT of the abdomen and pelvis.   Foley catheter was replaced in the ED, blood culture  was collected, and the patient was treated with Rocephin .  Significant Events: Admitted 01/31/2024 for UTI, gross hematuria 02-05-2024 completed 5 days of IV rocephin . Foley catheter removed.  Admission Labs: Na 137, K 4.0, CO2 of 27, BUN 9, Scr 0.91, glu 96 T prot 8.5, alb 4.0, AST 34, ALT 19, alk phos 133, t bili 1.1 WBC 5.7, HgB 14.5, plt 199 UA cloudy, hgB large, negative nitrites, trace LE, RBC >50, WBC 6-10, rare bacteria  Admission Imaging Studies: CXR No active disease. Cardiomegaly  CT abd/pelvis Interval placement of a left ureteral stent with resolution of previous left hydroureteronephrosis. 2. Small amount of gas in the bladder, likely related to instrumentation. Foley catheter in place. Bladder evaluation limited by streak artifact from bilateral hip arthroplasties.  Significant Labs: Urine cx with insignificant growth  Significant Imaging Studies:   Antibiotic Therapy: Anti-infectives (From admission, onward)    Start     Dose/Rate Route Frequency Ordered Stop   02/01/24 2000  cefTRIAXone  (ROCEPHIN ) 1 g in sodium chloride  0.9 % 100 mL IVPB        1 g 200 mL/hr over 30 Minutes Intravenous Every 24 hours 01/31/24 2020     01/31/24 2000  cefTRIAXone  (ROCEPHIN ) 1 g in sodium chloride  0.9 % 100 mL IVPB        1 g 200 mL/hr over 30 Minutes Intravenous  Once 01/31/24 1950 01/31/24 2053       Procedures:   Consultants:     Assessment and Plan: * UTI (urinary tract infection) due  to urinary indwelling Foley catheter 02/05/24 pt had 5 days of IV Rocephin . Finished on 02-04-2024. Urine cx showed no significant growth.  Pt did not have chronic foley prior to January 02, 2024 admission. She was DC to SNF with foley after having JJ stent placement.  Will remove foley and see if she can urinate without it.   Debility 02/05/24 family wants pt to go to SNF. TOC working on this   Vascular dementia (HCC) 02/05/24 chronic. Stable.   Localization-related (focal)  (partial) idiopathic epilepsy and epileptic syndromes with seizures of localized onset, not intractable, without status epilepticus (HCC) 02/05/24 stable. On depakote  250 mg tid, vimpat  50 mg bid, lamictal  25 mg bid, keppra  500 mg bid.   Hypertension 02/05/24 stable. On avapro  300 mg daily.   Nausea & vomiting-resolved as of 02/05/2024 02/05/24 resolved. May have been due to UTI.   Gross hematuria-resolved as of 02/05/2024 02/05/24 resolved. Remove foley catheter today.   Obesity, Class I, BMI 30-34.9 Body mass index is 30.41 kg/m.   DVT prophylaxis: SCDs Start: 01/31/24 2037    Code Status: Full Code Family Communication: no family at bedside Disposition Plan: SNF Reason for continuing need for hospitalization: stable for DC to SNF.  Objective: Vitals:   02/04/24 2013 02/05/24 0553 02/05/24 0959 02/05/24 1255  BP: (!) 163/81 (!) 136/58 (!) 159/69 (!) 147/76  Pulse: 62 61 60 66  Resp: 15 15  18   Temp: 98.1 F (36.7 C) 98.5 F (36.9 C)  98.3 F (36.8 C)  TempSrc: Oral Oral  Oral  SpO2: 100%   98%  Weight:      Height:        Intake/Output Summary (Last 24 hours) at 02/05/2024 1328 Last data filed at 02/05/2024 0600 Gross per 24 hour  Intake 490 ml  Output 950 ml  Net -460 ml   Filed Weights   01/31/24 2200  Weight: 82.9 kg    Examination:  Physical Exam Vitals and nursing note reviewed.  Constitutional:      General: She is not in acute distress.    Appearance: She is not toxic-appearing or diaphoretic.  HENT:     Head: Normocephalic and atraumatic.  Eyes:     General: No scleral icterus. Cardiovascular:     Rate and Rhythm: Normal rate and regular rhythm.  Pulmonary:     Effort: Pulmonary effort is normal.     Breath sounds: Normal breath sounds.  Abdominal:     General: Bowel sounds are normal. There is no distension.     Palpations: Abdomen is soft.     Tenderness: There is no abdominal tenderness.  Genitourinary:    Comments: +foley  catheter. Clear yellow urine in foley bag. Musculoskeletal:     Right lower leg: No edema.     Left lower leg: No edema.  Skin:    General: Skin is warm and dry.     Capillary Refill: Capillary refill takes less than 2 seconds.  Neurological:     General: No focal deficit present.     Mental Status: She is alert and oriented to person, place, and time.     Comments: She recalls going to urology office and vomiting in office, then being sent to ER for evaluation.     Data Reviewed: I have personally reviewed following labs and imaging studies  CBC: Recent Labs  Lab 01/31/24 1800 02/01/24 0353 02/02/24 0344 02/03/24 0300 02/04/24 0304  WBC 5.7 5.2 4.4 4.2 3.8*  NEUTROABS 3.7  --   --   --   --  HGB 14.5 12.5 11.1* 10.9* 10.9*  HCT 45.2 39.6 35.9* 35.0* 34.1*  MCV 93.8 95.4 95.2 94.9 95.3  PLT 199 174 150 148* 152   Basic Metabolic Panel: Recent Labs  Lab 01/31/24 1800 02/01/24 0353 02/02/24 0344 02/03/24 0300 02/04/24 0304  NA 137 137 137 136 135  K 4.0 3.3* 3.8 3.5 3.7  CL 101 104 106 106 106  CO2 27 22 22 22 22   GLUCOSE 96 91 84 100* 92  BUN 9 8 <5* 9 7*  CREATININE 0.91 0.72 0.64 0.71 0.74  CALCIUM 10.0 9.1 8.6* 8.9 8.8*  MG  --  2.0  --   --   --    GFR: Estimated Creatinine Clearance: 65.6 mL/min (by C-G formula based on SCr of 0.74 mg/dL). Liver Function Tests: Recent Labs  Lab 01/31/24 1800  AST 34  ALT 19  ALKPHOS 133*  BILITOT 1.1  PROT 8.5*  ALBUMIN  4.0   Coagulation Profile: Recent Labs  Lab 01/31/24 1800  INR 1.0   ProBNP, BNP (last 5 results) Recent Labs    09/12/23 1138 12/06/23 1658  PROBNP  --  64.3  BNP 45.2  --    Sepsis Labs: Recent Labs  Lab 01/31/24 1804  LATICACIDVEN 1.1    Recent Results (from the past 240 hours)  Blood Culture (routine x 2)     Status: None   Collection Time: 01/31/24  6:00 PM   Specimen: BLOOD  Result Value Ref Range Status   Specimen Description   Final    BLOOD SITE NOT  SPECIFIED Performed at [facility], [address] [place], [place] [address]    Special Requests   Final    BOTTLES DRAWN AEROBIC AND ANAEROBIC Blood Culture adequate volume Performed at [facility], [address] [place], [place] [address]    Culture   Final    NO GROWTH 5 DAYS Performed at [facility], [address], [place], [place] [address]    Report Status 02/05/2024 FINAL  Final  Culture, Urine (Do not remove urinary catheter, catheter placed by urology or difficult to place)     Status: Abnormal   Collection Time: 01/31/24  6:38 PM   Specimen: Urine, Catheterized  Result Value Ref Range Status   Specimen Description   Final    URINE, CATHETERIZED Performed at [facility], [address] [place], [place] [address]    Special Requests   Final    NONE Performed at [facility], [address] [place], [place] [address]    Culture (A)  Final    <10,000 COLONIES/mL INSIGNIFICANT GROWTH Performed at [facility], [address] [place], [place] [address]    Report Status 02/02/2024 FINAL  Final  Blood Culture (routine x 2)     Status: None (Preliminary result)   Collection Time: 02/01/24  3:53 AM   Specimen: BLOOD  Result Value Ref Range Status   Specimen Description   Final    BLOOD BLOOD RIGHT HAND Performed at [facility], [address], [place], [place] [address]    Special Requests   Final    Blood Culture results may not be optimal due to an inadequate volume of blood received in culture bottles BOTTLES DRAWN AEROBIC ONLY Performed at [facility], [address] [place], [place] [address]    Culture   Final    NO GROWTH 4 DAYS Performed at  [facility], [address], [place], [place] [address]    Report Status PENDING  Incomplete  MRSA Next Gen by PCR, Nasal     Status: Abnormal   Collection Time: 02/02/24  9:22 AM    Specimen: Nasal Mucosa; Nasal Swab  Result Value Ref Range Status   MRSA by PCR Next Gen DETECTED (A) NOT DETECTED Final    Comment: RESULT CALLED TO, READ BACK BY AND VERIFIED WITH: ROBYNN NOVAK RN @ [phone] 02/02/24. GILBERTL (NOTE) The GeneXpert MRSA Assay (FDA approved for NASAL specimens only), is one component of a comprehensive MRSA colonization surveillance program. It is not intended to diagnose MRSA infection nor to guide or monitor treatment for MRSA infections. Test performance is not FDA approved in patients less than 66 years old. Performed at [facility], [address], [place], [place] [address]     Scheduled Meds:  Chlorhexidine  Gluconate Cloth  6 each Topical Daily   Chlorhexidine  Gluconate Cloth  6 each Topical Daily   divalproex   250 mg Oral TID   hydrALAZINE   25 mg Oral Q8H   irbesartan   300 mg Oral Daily   lacosamide   50 mg Oral BID   lamoTRIgine   25 mg Oral BID   levETIRAcetam   500 mg Oral BID   mupirocin ointment  1 Application Nasal BID   sodium chloride  flush  3 mL Intravenous Q12H   Continuous Infusions:   LOS: 4 days   Time spent: 55 minutes  Camellia Door, DO  [facility]  02/05/2024, 1:28 PM

## 2024-02-05 NOTE — Hospital Course (Addendum)
 CC: sent from urology office for gross hematuria in foley catheter  HPI: Alicia Brennan is a 73 y.o. female with medical history significant for hypertension, vascular dementia, seizure disorder, and left hydroureteronephrosis status post ureteral stent placement on 01/03/2024 now presenting with gross hematuria, nausea, vomiting, suprapubic pain, and chills.   Patient is accompanied by her daughter who helps with the history.  She had been in her usual state until yesterday evening when she developed suprapubic discomfort, nausea with recurrent episodes of nonbloody vomiting, and noticed gross blood from her Foley catheter.  She was taken to see urology today but was lethargic at the time and directed to the ED.  Mental status has since returned to baseline but she continues to have nausea, chills, and suprapubic pain.   ED Course: Upon arrival to the ED, patient is found to be afebrile and saturating well on room air with normal HR and stable BP.  Labs are notable for normal renal function, normal CBC, normal lactic acid, and UA with bacteriuria, pyuria, and hematuria.  There are no acute findings on CT of the abdomen and pelvis.   Foley catheter was replaced in the ED, blood culture was collected, and the patient was treated with Rocephin .  Significant Events: Admitted 01/31/2024 for UTI, gross hematuria 02-05-2024 completed 5 days of IV rocephin . Foley catheter removed.  Admission Labs: Na 137, K 4.0, CO2 of 27, BUN 9, Scr 0.91, glu 96 T prot 8.5, alb 4.0, AST 34, ALT 19, alk phos 133, t bili 1.1 WBC 5.7, HgB 14.5, plt 199 UA cloudy, hgB large, negative nitrites, trace LE, RBC >50, WBC 6-10, rare bacteria  Admission Imaging Studies: CXR No active disease. Cardiomegaly  CT abd/pelvis Interval placement of a left ureteral stent with resolution of previous left hydroureteronephrosis. 2. Small amount of gas in the bladder, likely related to instrumentation. Foley catheter in place. Bladder  evaluation limited by streak artifact from bilateral hip arthroplasties.  Significant Labs: Urine cx with insignificant growth  Significant Imaging Studies:   Antibiotic Therapy: Anti-infectives (From admission, onward)    Start     Dose/Rate Route Frequency Ordered Stop   02/01/24 2000  cefTRIAXone  (ROCEPHIN ) 1 g in sodium chloride  0.9 % 100 mL IVPB        1 g 200 mL/hr over 30 Minutes Intravenous Every 24 hours 01/31/24 2020     01/31/24 2000  cefTRIAXone  (ROCEPHIN ) 1 g in sodium chloride  0.9 % 100 mL IVPB        1 g 200 mL/hr over 30 Minutes Intravenous  Once 01/31/24 1950 01/31/24 2053       Procedures:   Consultants:

## 2024-02-05 NOTE — Assessment & Plan Note (Signed)
 02/05/24 family wants pt to go to SNF. TOC working on this

## 2024-02-05 NOTE — Assessment & Plan Note (Addendum)
 02/05/24 stable. On depakote  250 mg tid, vimpat  50 mg bid, lamictal  25 mg bid, keppra  500 mg bid.

## 2024-02-05 NOTE — Assessment & Plan Note (Addendum)
 02/05/24 pt had 5 days of IV Rocephin . Finished on 02-04-2024. Urine cx showed no significant growth.  Pt did not have chronic foley prior to January 02, 2024 admission. She was DC to SNF with foley after having JJ stent placement.  Will remove foley and see if she can urinate without it.

## 2024-02-05 NOTE — Assessment & Plan Note (Signed)
Body mass index is 30.41 kg/m².

## 2024-02-05 NOTE — Assessment & Plan Note (Addendum)
 02/05/24 stable. On avapro  300 mg daily.

## 2024-02-05 NOTE — Discharge Summary (Signed)
 [facility] Physician Discharge Summary   Patient name: Alicia Brennan  Admit date:     01/31/2024  Discharge date: 02/06/2024  Attending Physician: DAVIA NYDIA POUR [[id number]]  Discharge Physician: Camellia Door   PCP: [name], Edra GRADE, MD  Admitted From: SNF [facility]  Disposition:    SNF  Recommendations for Outpatient Follow-up:  Follow up with PCP in 1-2 weeks  Home Health:No Equipment/Devices: None  Discharge Condition:Stable CODE STATUS:FULL Diet recommendation: Heart Healthy Fluid Restriction: None  Hospital Summary: CC: sent from urology office for gross hematuria in foley catheter  HPI: Alicia Brennan is a 73 y.o. female with medical history significant for hypertension, vascular dementia, seizure disorder, and left hydroureteronephrosis status post ureteral stent placement on 01/03/2024 now presenting with gross hematuria, nausea, vomiting, suprapubic pain, and chills.   Patient is accompanied by her daughter who helps with the history.  She had been in her usual state until yesterday evening when she developed suprapubic discomfort, nausea with recurrent episodes of nonbloody vomiting, and noticed gross blood from her Foley catheter.  She was taken to see urology today but was lethargic at the time and directed to the ED.  Mental status has since returned to baseline but she continues to have nausea, chills, and suprapubic pain.   ED Course: Upon arrival to the ED, patient is found to be afebrile and saturating well on room air with normal HR and stable BP.  Labs are notable for normal renal function, normal CBC, normal lactic acid, and UA with bacteriuria, pyuria, and hematuria.  There are no acute findings on CT of the abdomen and pelvis.   Foley catheter was replaced in the ED, blood culture was collected, and the patient was treated with Rocephin .  Significant Events: Admitted 01/31/2024 for UTI, gross hematuria 02-05-2024 completed 5 days of IV  rocephin . Foley catheter removed.  Admission Labs: Na 137, K 4.0, CO2 of 27, BUN 9, Scr 0.91, glu 96 T prot 8.5, alb 4.0, AST 34, ALT 19, alk phos 133, t bili 1.1 WBC 5.7, HgB 14.5, plt 199 UA cloudy, hgB large, negative nitrites, trace LE, RBC >50, WBC 6-10, rare bacteria  Admission Imaging Studies: CXR No active disease. Cardiomegaly  CT abd/pelvis Interval placement of a left ureteral stent with resolution of previous left hydroureteronephrosis. 2. Small amount of gas in the bladder, likely related to instrumentation. Foley catheter in place. Bladder evaluation limited by streak artifact from bilateral hip arthroplasties.  Significant Labs: Urine cx with insignificant growth  Significant Imaging Studies:   Antibiotic Therapy: Anti-infectives (From admission, onward)    Start     Dose/Rate Route Frequency Ordered Stop   02/01/24 2000  cefTRIAXone  (ROCEPHIN ) 1 g in sodium chloride  0.9 % 100 mL IVPB        1 g 200 mL/hr over 30 Minutes Intravenous Every 24 hours 01/31/24 2020     01/31/24 2000  cefTRIAXone  (ROCEPHIN ) 1 g in sodium chloride  0.9 % 100 mL IVPB        1 g 200 mL/hr over 30 Minutes Intravenous  Once 01/31/24 1950 01/31/24 2053       Procedures:   Consultants:    Hospital Course by Problem: * UTI (urinary tract infection) due to urinary indwelling Foley catheter 02/05/24 pt had 5 days of IV Rocephin . Finished on 02-04-2024. Urine cx showed no significant growth.  Pt did not have chronic foley prior to January 02, 2024 admission. She was DC to SNF with foley after having JJ  stent placement.  Will remove foley and see if she can urinate without it.   Debility 02/05/24 family wants pt to go to SNF. TOC working on this   Vascular dementia (HCC) 02/05/24 chronic. Stable.   Localization-related (focal) (partial) idiopathic epilepsy and epileptic syndromes with seizures of localized onset, not intractable, without status epilepticus (HCC) 02/05/24 stable. On  depakote  250 mg tid, vimpat  50 mg bid, lamictal  25 mg bid, keppra  500 mg bid.   Hypertension 02/05/24 stable. On avapro  300 mg daily.   Nausea & vomiting-resolved as of 02/05/2024 02/05/24 resolved. May have been due to UTI.   Gross hematuria-resolved as of 02/05/2024 02/05/24 resolved. Remove foley catheter today.   Obesity, Class I, BMI 30-34.9 Body mass index is 30.41 kg/m.   Discharge Diagnoses:  Principal Problem:   UTI (urinary tract infection) due to urinary indwelling Foley catheter Active Problems:   Debility   Hypertension   Localization-related (focal) (partial) idiopathic epilepsy and epileptic syndromes with seizures of localized onset, not intractable, without status epilepticus (HCC)   Vascular dementia (HCC)   Obesity, Class I, BMI 30-34.9  Discharge Instructions  Discharge Instructions     Call MD for:  difficulty breathing, headache or visual disturbances   Complete by: As directed    Call MD for:  extreme fatigue   Complete by: As directed    Call MD for:  hives   Complete by: As directed    Call MD for:  persistant dizziness or light-headedness   Complete by: As directed    Call MD for:  persistant nausea and vomiting   Complete by: As directed    Call MD for:  redness, tenderness, or signs of infection (pain, swelling, redness, odor or green/yellow discharge around incision site)   Complete by: As directed    Call MD for:  severe uncontrolled pain   Complete by: As directed    Call MD for:  temperature >100.4   Complete by: As directed    Diet - low sodium heart healthy   Complete by: As directed    Discharge instructions   Complete by: As directed    1. Follow up with your primary care provider in 1-2 weeks following discharge from hospital.   Discharge wound care:   Complete by: As directed    Wound type:  Sacrum deep tissue pressure injury with scattered scabs- evolving Pressure Injury POA: Yes Measurement: see nursing flow  sheets Wound bed: deep purple maroon discoloration with scattered scabs in lateral aspect of wound Drainage (amount, consistency, odor) see nursing flow sheets Periwound: intact Dressing procedure/placement/frequency:   Cleanse with NS, pat dry.  Apply xeroform to wound bed and cover with silicone foam dressing.  Change daily.   Increase activity slowly   Complete by: As directed       Allergies as of 02/05/2024   No Known Allergies      Medication List     PAUSE taking these medications    Nayzilam  5 MG/0.1ML Soln Wait to take this until your doctor or other care provider tells you to start again. Generic drug: Midazolam  GIVE ONE SPRAY INTO 1 NOSTRIL. IF NO RESPONSE IN 10 MINUTES, MAY GIVE SECOND SPRAY IN OTHER NOSTRIL. DO NOT GIVE SECOND DOSE IF PATIENT HAS TROUBLE BREATHING OR EXCESSIVE SEDATION, MAX 2 DOSES/SEIZURE EPISODE, 1 EPISODE EVERY 3 DAYS OR 5 EPISODES/MONTH Nasal for 6 Days       TAKE these medications    divalproex  250 MG DR tablet Commonly  known as: Depakote  Take 1 tablet (250 mg total) by mouth 3 (three) times daily.   feeding supplement Liqd Take 237 mLs by mouth 2 (two) times daily between meals.   furosemide  40 MG tablet Commonly known as: LASIX  Take 1 tablet (40 mg total) by mouth daily as needed. What changed:  when to take this reasons to take this   hydrALAZINE  25 MG tablet Commonly known as: APRESOLINE  Take 1 tablet (25 mg total) by mouth every 8 (eight) hours.   lacosamide  50 MG Tabs tablet Commonly known as: VIMPAT  Take 1 tablet (50 mg total) by mouth 2 (two) times daily.   lactulose  10 GM/15ML solution Commonly known as: CHRONULAC  Take 15 mLs (10 g total) by mouth daily.   lamoTRIgine  25 MG tablet Commonly known as: LAMICTAL  Take 25 mg by mouth 2 (two) times daily.   levETIRAcetam  500 MG tablet Commonly known as: KEPPRA  Take 1 tablet (500 mg total) by mouth 2 (two) times daily.   senna 8.6 MG tablet Commonly known as:  SENOKOT Take 1 tablet by mouth at bedtime.   sertraline 25 MG tablet Commonly known as: ZOLOFT Take 25 mg by mouth daily.   telmisartan 80 MG tablet Commonly known as: MICARDIS Take 80 mg by mouth daily.               Discharge Care Instructions  (From admission, onward)           Start     Ordered   02/05/24 0000  Discharge wound care:       Comments: Wound type:  Sacrum deep tissue pressure injury with scattered scabs- evolving Pressure Injury POA: Yes Measurement: see nursing flow sheets Wound bed: deep purple maroon discoloration with scattered scabs in lateral aspect of wound Drainage (amount, consistency, odor) see nursing flow sheets Periwound: intact Dressing procedure/placement/frequency:   Cleanse with NS, pat dry.  Apply xeroform to wound bed and cover with silicone foam dressing.  Change daily.   02/05/24 1557            No Known Allergies  Discharge Exam: Vitals:   02/05/24 0959 02/05/24 1255  BP: (!) 159/69 (!) 147/76  Pulse: 60 66  Resp:  18  Temp:  98.3 F (36.8 C)  SpO2:  98%    Physical Exam Vitals and nursing note reviewed.  Constitutional:      General: She is not in acute distress.    Appearance: She is not toxic-appearing or diaphoretic.  HENT:     Head: Normocephalic and atraumatic.  Eyes:     General: No scleral icterus. Cardiovascular:     Rate and Rhythm: Normal rate and regular rhythm.  Pulmonary:     Effort: Pulmonary effort is normal.     Breath sounds: Normal breath sounds.  Abdominal:     General: Bowel sounds are normal. There is no distension.     Palpations: Abdomen is soft.     Tenderness: There is no abdominal tenderness.  Genitourinary:    Comments: +foley catheter. Clear yellow urine in foley bag. Musculoskeletal:     Right lower leg: No edema.     Left lower leg: No edema.  Skin:    General: Skin is warm and dry.     Capillary Refill: Capillary refill takes less than 2 seconds.  Neurological:      General: No focal deficit present.     Mental Status: She is alert and oriented to person, place, and time.  Comments: She recalls going to urology office and vomiting in office, then being sent to ER for evaluation.    The results of significant diagnostics from this hospitalization (including imaging, microbiology, ancillary and laboratory) are listed below for reference.    Microbiology: Recent Results (from the past 240 hours)  Blood Culture (routine x 2)     Status: None   Collection Time: 01/31/24  6:00 PM   Specimen: BLOOD  Result Value Ref Range Status   Specimen Description   Final    BLOOD SITE NOT SPECIFIED Performed at [facility], [address], [place], [place] [address]    Special Requests   Final    BOTTLES DRAWN AEROBIC AND ANAEROBIC Blood Culture adequate volume Performed at [facility], [address] [place], [place] [address]    Culture   Final    NO GROWTH 5 DAYS Performed at [facility], [address] [place], [place] [address]    Report Status 02/05/2024 FINAL  Final  Culture, Urine (Do not remove urinary catheter, catheter placed by urology or difficult to place)     Status: Abnormal   Collection Time: 01/31/24  6:38 PM   Specimen: Urine, Catheterized  Result Value Ref Range Status   Specimen Description   Final    URINE, CATHETERIZED Performed at [facility], [address] [place], [place] [address]    Special Requests   Final    NONE Performed at [facility], [address] [place], [place] [address]    Culture (A)  Final    <10,000 COLONIES/mL INSIGNIFICANT GROWTH Performed at [facility], [address], [place], [place] [address]    Report Status 02/02/2024 FINAL  Final  Blood Culture (routine x 2)     Status: None (Preliminary result)   Collection Time: 02/01/24  3:53 AM   Specimen: BLOOD  Result Value Ref Range Status   Specimen  Description   Final    BLOOD BLOOD RIGHT HAND Performed at [facility], [address], [place], [place] [address]    Special Requests   Final    Blood Culture results may not be optimal due to an inadequate volume of blood received in culture bottles BOTTLES DRAWN AEROBIC ONLY Performed at [facility], [address], [place], [place] [address]    Culture   Final    NO GROWTH 4 DAYS Performed at [facility], [address] [place], [place] [address]    Report Status PENDING  Incomplete  MRSA Next Gen by PCR, Nasal     Status: Abnormal   Collection Time: 02/02/24  9:22 AM   Specimen: Nasal Mucosa; Nasal Swab  Result Value Ref Range Status   MRSA by PCR Next Gen DETECTED (A) NOT DETECTED Final    Comment: RESULT CALLED TO, READ BACK BY AND VERIFIED WITH: ROBYNN NOVAK RN @ [phone] 02/02/24. [facility] (NOTE) The GeneXpert MRSA Assay (FDA approved for NASAL specimens only), is one component of a comprehensive MRSA colonization surveillance program. It is not intended to diagnose MRSA infection nor to guide or monitor treatment for MRSA infections. Test performance is not FDA approved in patients less than 22 years old. Performed at [facility], [address] [place], [place] [address]     Labs: ProBNP, BNP (last 5 results) Recent Labs    09/12/23 1138 12/06/23 1658  PROBNP  --  64.3  BNP 45.2  --    Basic Metabolic Panel: Recent Labs  Lab 01/31/24 1800 02/01/24 0353 02/02/24 0344 02/03/24 0300 02/04/24 0304  NA 137 137 137 136 135  K 4.0 3.3* 3.8 3.5 3.7  CL 101 104 106 106 106  CO2 27 22 22 22 22   GLUCOSE 96 91 84 100* 92  BUN 9 8 <5* 9 7*  CREATININE 0.91 0.72 0.64 0.71 0.74  CALCIUM 10.0 9.1 8.6* 8.9 8.8*  MG  --  2.0  --   --   --    Liver Function Tests: Recent Labs  Lab 01/31/24 1800  AST 34  ALT 19  ALKPHOS 133*  BILITOT 1.1  PROT 8.5*  ALBUMIN  4.0   CBC: Recent Labs  Lab  01/31/24 1800 02/01/24 0353 02/02/24 0344 02/03/24 0300 02/04/24 0304  WBC 5.7 5.2 4.4 4.2 3.8*  NEUTROABS 3.7  --   --   --   --   HGB 14.5 12.5 11.1* 10.9* 10.9*  HCT 45.2 39.6 35.9* 35.0* 34.1*  MCV 93.8 95.4 95.2 94.9 95.3  PLT 199 174 150 148* 152   Urinalysis    Component Value Date/Time   COLORURINE YELLOW 01/31/2024 1838   APPEARANCEUR CLOUDY (A) 01/31/2024 1838   LABSPEC 1.011 01/31/2024 1838   PHURINE 7.0 01/31/2024 1838   GLUCOSEU NEGATIVE 01/31/2024 1838   HGBUR LARGE (A) 01/31/2024 1838   BILIRUBINUR NEGATIVE 01/31/2024 1838   KETONESUR NEGATIVE 01/31/2024 1838   PROTEINUR 100 (A) 01/31/2024 1838   NITRITE NEGATIVE 01/31/2024 1838   LEUKOCYTESUR TRACE (A) 01/31/2024 1838   Sepsis Labs Recent Labs  Lab 02/01/24 0353 02/02/24 0344 02/03/24 0300 02/04/24 0304  WBC 5.2 4.4 4.2 3.8*    Procedures/Studies: CT ABDOMEN PELVIS W CONTRAST Result Date: 01/31/2024 EXAM: CT ABDOMEN AND PELVIS WITH CONTRAST 01/31/2024 07:25:30 PM TECHNIQUE: CT of the abdomen and pelvis was performed with the administration of 100 mL of iohexol (OMNIPAQUE) 300 MG/ML solution. Multiplanar reformatted images are provided for review. Automated exposure control, iterative reconstruction, and/or weight-based adjustment of the mA/kV was utilized to reduce the radiation dose to as low as reasonably achievable. COMPARISON: Comparison with 01/02/2024. CLINICAL HISTORY: seen for hydronephrosis and managed with foley. increasing blood in foley. No stone seen the last time. FINDINGS: LOWER CHEST: Small hiatal hernia. LIVER: Hepatic steatosis. GALLBLADDER AND BILE DUCTS: Cholecystectomy. No biliary ductal dilatation. SPLEEN: No acute abnormality. PANCREAS: No acute abnormality. ADRENAL GLANDS: No acute abnormality. KIDNEYS, URETERS AND BLADDER: Interval placement of a left ureteral stent. The previous left hydroureteronephrosis has resolved. No stones in the kidneys or ureters. No perinephric or periureteral  stranding. Foley catheter in the bladder. Small amount of gas in the bladder. The bladder is not well evaluated due to streak artifact from the bilateral hip arthroplasties. GI AND BOWEL: Stomach demonstrates no acute abnormality. There is no bowel obstruction. PERITONEUM AND RETROPERITONEUM: Small volume pelvic ascites. No free air. VASCULATURE: Aorta is normal in caliber. LYMPH NODES: No lymphadenopathy. REPRODUCTIVE ORGANS: No acute abnormality. BONES AND SOFT TISSUES: Bilateral hip arthroplasties. No acute osseous abnormality. No focal soft tissue abnormality. IMPRESSION: 1. Interval placement of a left ureteral stent with resolution of previous left hydroureteronephrosis. 2. Small amount of gas in the bladder, likely related to instrumentation. Foley catheter in place. Bladder evaluation limited by streak artifact from bilateral hip arthroplasties. Electronically signed by: Norman Gatlin MD 01/31/2024 07:33 PM EST RP Workstation: [id number]   DG Chest Port 1 View Result Date: 01/31/2024 CLINICAL DATA:  Possible  sepsis EXAM: PORTABLE CHEST 1 VIEW COMPARISON:  01/02/2024 FINDINGS: Enlarged cardiomediastinal silhouette with aortic atherosclerosis. No focal opacity, pleural effusion, or pneumothorax IMPRESSION: No active disease. Cardiomegaly. Electronically Signed   By: Luke Bun M.D.   On: 01/31/2024 17:20    Time coordinating discharge: 60 mins  SIGNED:  Camellia Door, DO [facility] 02/05/24, 3:57 PM

## 2024-02-05 NOTE — Plan of Care (Signed)

## 2024-02-05 NOTE — Assessment & Plan Note (Addendum)
 02/05/24 resolved. May have been due to UTI.

## 2024-02-05 NOTE — Assessment & Plan Note (Addendum)
 02/05/24 resolved. Remove foley catheter today.

## 2024-02-05 NOTE — Subjective & Objective (Addendum)
 Pt seen and examined. Initial foley catheter placed on 01-03-2024 during her initial operation for left ureter stone. She was discharge to SNF with foley catheter. Pt went to SNF after discharge.  She went to urology clinic but had N/V and was not seen in urology clinic but sent to ER for evaluation.  She was admitted for presumed UTI but her urine cx are negative. She completed 5 days of IV ABX with Rocephin .   No hx of recent urinary retention. Family wants pt to go to SNF.  Pt was living with her son [name] and dtr-in-law [name] prior to admission in October 2025.

## 2024-02-05 NOTE — TOC Progression Note (Addendum)
 Transition of Care [facility]) - Progression Note    Patient Details  Name: Terrah Decoster MRN: [id number] Date of Birth: 1950/04/15  [facility] CM/SW Contact  [name], RN Phone Number: 02/05/2024, 9:07 AM  Clinical Narrative:    CM called [name] (dlt) [phone] for choice decision. [name] requested an email list of SFN referrals. CM emailed list to [email or web address] .com CM informed [name] that a choice must be made by 4:00 PM today. 1:18 PM CM spoke with patient and daughter. [name] has called several SNF without success of a bed offer. CM encouraged [name] to reach out again or we will start auth for [facility].[name] would like for her mother to go to [place], [place], [place], then [place] in that order.  Expected Discharge Plan: Skilled Nursing Facility Barriers to Discharge: Continued Medical Work up               Expected Discharge Plan and Services In-house Referral: Clinical Social Work   Post Acute Care Choice: Skilled Nursing Facility Living arrangements for the past 2 months: Skilled Nursing Facility, Single Family Home                           HH Arranged: Social Work           Social Drivers of Health (SDOH) Interventions SDOH Screenings   Food Insecurity: Food Insecurity Present (01/31/2024)  Housing: Low Risk  (01/31/2024)  Transportation Needs: No Transportation Needs (01/31/2024)  Utilities: Not At Risk (01/31/2024)  Financial Resource Strain: Low Risk (11/16/2023)   [facility] from [facility]  Social Connections: Moderately Isolated (01/31/2024)  Tobacco Use: Low Risk  (01/31/2024)    Readmission Risk Interventions     No data to display

## 2024-02-05 NOTE — Assessment & Plan Note (Addendum)
 02/05/24 chronic. Stable.

## 2024-02-06 ENCOUNTER — Inpatient Hospital Stay (HOSPITAL_COMMUNITY)

## 2024-02-06 DIAGNOSIS — T83511A Infection and inflammatory reaction due to indwelling urethral catheter, initial encounter: Secondary | ICD-10-CM | POA: Diagnosis not present

## 2024-02-06 DIAGNOSIS — N390 Urinary tract infection, site not specified: Secondary | ICD-10-CM | POA: Diagnosis not present

## 2024-02-06 LAB — CULTURE, BLOOD (ROUTINE X 2): Culture: NO GROWTH

## 2024-02-06 MED ORDER — SODIUM CHLORIDE 0.9 % IV SOLN
2.0000 g | INTRAVENOUS | Status: DC
  Administered 2024-02-06: 2 g via INTRAVENOUS
  Filled 2024-02-06: qty 20

## 2024-02-06 NOTE — Plan of Care (Signed)

## 2024-02-06 NOTE — Progress Notes (Addendum)
 PROGRESS NOTE    Alicia Brennan  [name] DOB: 09-12-1950 DOA: 01/31/2024 PCP: [name], MD   Brief Narrative: 73 year old with past medical history significant for hypertension, vascular dementia, seizure disorder, left hydroureteronephrosis status post ureteral stent placement on 01/03/2024 presented with gross hematuria, nausea vomiting, suprapubic pain and chills.  She was taken to see urology the day of admission but she was so lethargic at the time that she was directed to the ED.  Evaluation in the ED UA with bacteriuria, pyuria and hematuria.  No significant acute changes on CT abdomen and pelvis.  Foley catheter was replaced in the ED, blood cultures obtained.  Patient was treated with IV ceftriaxone  and completed 5 days.  She is currently awaiting placement.    Assessment & Plan:   Principal Problem:   UTI (urinary tract infection) due to urinary indwelling Foley catheter Active Problems:   Debility   Hypertension   Localization-related (focal) (partial) idiopathic epilepsy and epileptic syndromes with seizures of localized onset, not intractable, without status epilepticus (HCC)   Vascular dementia (HCC)   Obesity, Class I, BMI 30-34.9   1-Urinary tract infection in the setting of indwelling Foley catheter - Patient presented with hematuria, altered mental status, UA with pyuria.  She was treated with 5 days of IV ceftriaxone .  CT abdomen and pelvis 11/6: Show interval placement of left ureteral stent with resolution of previous left hydroureteronephrosis.  A small amount of gas in the bladder, likely related to instrumentation.  Foley catheter in place. - Foley catheter was removed 11/11.  Failed voiding trial.  -She was supposed to follow up with Urology after stent placement last hospitalization. Will let Urology know of admission  -Discussed with Urology, plan to check renal US  tomorrow, resume antibiotics, they will consider removal of stent  tomorrow  Debility: Plan for rehab  Vascular dementia: Stable Delirium precaution  Hypertension: Continue Avapro  Vomiting: Resolved  Obesity class I BMI 30-34: Need lifestyle modification      See wound care documentation below  Wound 01/03/24 1814 Pressure Injury Buttocks Right;Left Stage 2 -  Partial thickness loss of dermis presenting as a shallow open injury with a red, pink wound bed without slough. (Active)      Estimated body mass index is 30.41 kg/m as calculated from the following:   Height as of this encounter: 5' 5 (1.651 m).   Weight as of this encounter: 82.9 kg.   DVT prophylaxis: SCDs, no anticoagulation due to hematuria Code Status: Full code Family Communication: Disposition Plan:  Status is: Inpatient Remains inpatient appropriate because: Completed course of treatment for UTI awaiting placement    Consultants:    Procedures:    Antimicrobials:    Subjective: She is alert, denies pain, she had voiding trial yesterday Foley catheter had to be placed overnight due to urine retention.   Objective: Vitals:   02/06/24 0618 02/06/24 0700 02/06/24 1253 02/06/24 1402  BP: (!) 140/63 (!) 140/68 (!) 149/74 (!) 149/74  Pulse: 60 70 67   Resp: 16 16 18    Temp: 97.9 F (36.6 C) 98.8 F (37.1 C) 98.2 F (36.8 C)   TempSrc: Oral  Oral   SpO2: 96% 97% 98%   Weight:      Height:        Intake/Output Summary (Last 24 hours) at 02/06/2024 1404 Last data filed at 02/06/2024 1000 Gross per 24 hour  Intake 240 ml  Output 1650 ml  Net -1410 ml   [employer]  01/31/24 2200  Weight: 82.9 kg    Examination:  General exam: Appears calm and comfortable  Respiratory system: Clear to auscultation. Respiratory effort normal. Cardiovascular system: S1 & S2 heard, RRR. No JVD, murmurs, rubs, gallops or clicks. No pedal edema. Gastrointestinal system: Abdomen is nondistended, soft and nontender. No organomegaly or masses felt. Normal bowel  sounds heard. Central nervous system: Alert and oriented.  Extremities: Symmetric 5 x 5 power.    Data Reviewed: I have personally reviewed following labs and imaging studies  CBC: Recent Labs  Lab 01/31/24 1800 02/01/24 0353 02/02/24 0344 02/03/24 0300 02/04/24 0304  WBC 5.7 5.2 4.4 4.2 3.8*  NEUTROABS 3.7  --   --   --   --   HGB 14.5 12.5 11.1* 10.9* 10.9*  HCT 45.2 39.6 35.9* 35.0* 34.1*  MCV 93.8 95.4 95.2 94.9 95.3  PLT 199 174 150 148* 152   Basic Metabolic Panel: Recent Labs  Lab 01/31/24 1800 02/01/24 0353 02/02/24 0344 02/03/24 0300 02/04/24 0304  NA 137 137 137 136 135  K 4.0 3.3* 3.8 3.5 3.7  CL 101 104 106 106 106  CO2 27 22 22 22 22   GLUCOSE 96 91 84 100* 92  BUN 9 8 <5* 9 7*  CREATININE 0.91 0.72 0.64 0.71 0.74  CALCIUM 10.0 9.1 8.6* 8.9 8.8*  MG  --  2.0  --   --   --    GFR: Estimated Creatinine Clearance: 65.6 mL/min (by C-G formula based on SCr of 0.74 mg/dL). Liver Function Tests: Recent Labs  Lab 01/31/24 1800  AST 34  ALT 19  ALKPHOS 133*  BILITOT 1.1  PROT 8.5*  ALBUMIN  4.0   No results for input(s): LIPASE, AMYLASE in the last 168 hours. No results for input(s): AMMONIA in the last 168 hours. Coagulation Profile: Recent Labs  Lab 01/31/24 1800  INR 1.0   Cardiac Enzymes: No results for input(s): CKTOTAL, CKMB, CKMBINDEX, TROPONINI in the last 168 hours. BNP (last 3 results) Recent Labs    12/06/23 1658  PROBNP 64.3   HbA1C: No results for input(s): HGBA1C in the last 72 hours. CBG: No results for input(s): GLUCAP in the last 168 hours. Lipid Profile: No results for input(s): CHOL, HDL, LDLCALC, TRIG, CHOLHDL, LDLDIRECT in the last 72 hours. Thyroid Function Tests: No results for input(s): TSH, T4TOTAL, FREET4, T3FREE, THYROIDAB in the last 72 hours. Anemia Panel: No results for input(s): VITAMINB12, FOLATE, FERRITIN, TIBC, IRON, RETICCTPCT in the last 72  hours. Sepsis Labs: Recent Labs  Lab 01/31/24 1804  LATICACIDVEN 1.1    Recent Results (from the past 240 hours)  Blood Culture (routine x 2)     Status: None   Collection Time: 01/31/24  6:00 PM   Specimen: BLOOD  Result Value Ref Range Status   Specimen Description   Final    BLOOD SITE NOT SPECIFIED Performed at [facility], [address] [place], [place] [address]    Special Requests   Final    BOTTLES DRAWN AEROBIC AND ANAEROBIC Blood Culture adequate volume Performed at [facility], [address] [place], [place] [address]    Culture   Final    NO GROWTH 5 DAYS Performed at [facility], [address] [place], [place] [address]    Report Status 02/05/2024 FINAL  Final  Culture, Urine (Do not remove urinary catheter, catheter placed by urology or difficult to place)     Status: Abnormal  Collection Time: 01/31/24  6:38 PM   Specimen: Urine, Catheterized  Result Value Ref Range Status   Specimen Description   Final    URINE, CATHETERIZED Performed at [facility], [address] [place], [place] [address]    Special Requests   Final    NONE Performed at [facility], [address], [place], [place] [address]    Culture (A)  Final    <10,000 COLONIES/mL INSIGNIFICANT GROWTH Performed at [facility] [place] [facility], [address], [place], [place] [address]    Report Status 02/02/2024 FINAL  Final  Blood Culture (routine x 2)     Status: None   Collection Time: 02/01/24  3:53 AM   Specimen: BLOOD  Result Value Ref Range Status   Specimen Description   Final    BLOOD BLOOD RIGHT HAND Performed at [facility], [address], [place], [place] [address]    Special Requests   Final    Blood Culture results may not be optimal due to an inadequate volume of blood received in culture bottles BOTTLES DRAWN AEROBIC ONLY Performed at [facility],  [address] [place], [place] [address]    Culture   Final    NO GROWTH 5 DAYS Performed at [facility], [address], [place], [place] [address]    Report Status 02/06/2024 FINAL  Final  MRSA Next Gen by PCR, Nasal     Status: Abnormal   Collection Time: 02/02/24  9:22 AM   Specimen: Nasal Mucosa; Nasal Swab  Result Value Ref Range Status   MRSA by PCR Next Gen DETECTED (A) NOT DETECTED Final    Comment: RESULT CALLED TO, READ BACK BY AND VERIFIED WITH: ROBYNN NOVAK RN @ [phone] 02/02/24. GILBERTL (NOTE) The GeneXpert MRSA Assay (FDA approved for NASAL specimens only), is one component of a comprehensive MRSA colonization surveillance program. It is not intended to diagnose MRSA infection nor to guide or monitor treatment for MRSA infections. Test performance is not FDA approved in patients less than 2 years old. Performed at [facility], [address] [place], [place] [address]          Radiology Studies: No results found.      Scheduled Meds:  Chlorhexidine  Gluconate Cloth  6 each Topical Daily   divalproex   250 mg Oral TID   hydrALAZINE   25 mg Oral Q8H   irbesartan   300 mg Oral Daily   lacosamide   50 mg Oral BID   lamoTRIgine   25 mg Oral BID   levETIRAcetam   500 mg Oral BID   mupirocin ointment  1 Application Nasal BID   sodium chloride  flush  3 mL Intravenous Q12H   Continuous Infusions:   LOS: 5 days    Time spent: 35 Minutes    Alicia Brennan A Mckinsley Koelzer, MD [facility]   If 7PM-7AM, please contact night-coverage [email or web address]  02/06/2024, 2:04 PM

## 2024-02-06 NOTE — Plan of Care (Signed)
   Problem: Education: Goal: Knowledge of General Education information will improve Description: Including pain rating scale, medication(s)/side effects and non-pharmacologic comfort measures Outcome: Progressing   Problem: Health Behavior/Discharge Planning: Goal: Ability to manage health-related needs will improve Outcome: Progressing   Problem: Clinical Measurements: Goal: Will remain free from infection Outcome: Progressing

## 2024-02-06 NOTE — Progress Notes (Signed)
 Occupational Therapy Treatment Patient Details Name: Alicia Brennan MRN: [id number] DOB: 04/07/1950 Today's Date: 02/06/2024   History of present illness 73 yr old female  presenting with gross hematuria, nausea, vomiting, suprapubic pain, and chills; found to have UTI. Medical history significant for hypertension, vascular dementia, seizure disorder, and left hydroureteronephrosis status post ureteral stent placement   OT comments  The pt was seen for functional strengthening, ADL instruction and participation and progression of out of bed activity. She required assist for supine to sit, to donn both socks and to transfer to the bedside chair using Stedy device. +2 assist was needed for sit to stand and transfer to chair, as pt was noted to be with general deconditioning, generalized weakness, and impaired functional mobility. Once seated in the chair, she required set-up assist for teeth brushing. Continue OT plan of care to facilitate progressive strengthening and improved ADL performance. Patient will benefit from continued inpatient follow up therapy, <3 hours/day.       If plan is discharge home, recommend the following:  Two people to help with walking and/or transfers;A lot of help with bathing/dressing/bathroom;Assistance with cooking/housework;Direct supervision/assist for medications management;Direct supervision/assist for financial management;Assist for transportation;Help with stairs or ramp for entrance;Supervision due to cognitive status   Equipment Recommendations  None recommended by OT    Recommendations for Other Services      Precautions / Restrictions Precautions Precautions: Fall Restrictions Weight Bearing Restrictions Per Provider Order: No       Mobility Bed Mobility Overal bed mobility: Needs Assistance       Supine to sit: Mod assist, HOB elevated, Used rails     General bed mobility comments: required assist to advance BLE and for trunk to achieve  sitting; general cues needed for sequencing/transfer technique    Transfers Overall transfer level: Needs assistance Equipment used: Ambulation equipment used Transfers: Sit to/from Stand Sit to Stand: Mod assist, From elevated surface, +2 physical assistance, +2 safety/equipment           General transfer comment: required physical assist for best BLE placement on foot plate, cues to pull up on support bar, and cues for trunk extension in standing Transfer via Lift Equipment: Stedy   Balance     Sitting balance-Leahy Scale: Fair       Standing balance-Leahy Scale: Poor             ADL either performed or assessed with clinical judgement   ADL Overall ADL's : Needs assistance/impaired     Grooming: Set up;Supervision/safety;Sitting Grooming Details (indicate cue type and reason): She performed teeth brushing seated in the bedside chair.             Lower Body Dressing: Total assistance;Bed level Lower Body Dressing Details (indicate cue type and reason): Assist required to donn socks at bed level.                     Communication Communication Communication: No apparent difficulties   Cognition Arousal: Alert Behavior During Therapy: WFL for tasks assessed/performed Cognition: History of cognitive impairments             OT - Cognition Comments: per her medical chart, she has a history of vascular dementia        Following commands:  (Able to follow 1 step commands with slightly increased time)        Cueing   Cueing Techniques: Verbal cues, Gestural cues, Tactile cues  Pertinent Vitals/ Pain       Pain Assessment Pain Assessment: No/denies pain   Frequency  Min 2X/week        Progress Toward Goals  OT Goals(current goals can now be found in the care plan section)     Acute Rehab OT Goals OT Goal Formulation: With patient Time For Goal Achievement: 02/18/24 Potential to Achieve Goals: Good  Plan          AM-PAC OT 6 Clicks Daily Activity     Outcome Measure   Help from another person eating meals?: A Little Help from another person taking care of personal grooming?: A Little Help from another person toileting, which includes using toliet, bedpan, or urinal?: A Lot Help from another person bathing (including washing, rinsing, drying)?: A Lot Help from another person to put on and taking off regular upper body clothing?: A Little Help from another person to put on and taking off regular lower body clothing?: A Lot 6 Click Score: 15    End of Session Equipment Utilized During Treatment: Gait belt;Other (comment) (stedy)  OT Visit Diagnosis: Unsteadiness on feet (R26.81);Other abnormalities of gait and mobility (R26.89);Muscle weakness (generalized) (M62.81);Cognitive communication deficit (R41.841)   Activity Tolerance Patient tolerated treatment well   Patient Left in chair;with call bell/phone within reach;with chair alarm set   Nurse Communication Mobility status        Time: 8790-8765 OT Time Calculation (min): 25 min  Charges: OT General Charges $OT Visit: 1 Visit OT Treatments $Self Care/Home Management : 8-22 mins $Therapeutic Activity: 8-22 mins     Delanna JINNY Lesches, OTR/L 02/06/2024, 4:09 PM

## 2024-02-06 NOTE — TOC Progression Note (Addendum)
 Transition of Care [facility]) - Progression Note    Patient Details  Name: Alicia Brennan MRN: [id number] Date of Birth: [birth year]  Transition of Care [facility]) CM/SW Contact  [name], RN Phone Number: 02/06/2024, 8:46 AM  Clinical Narrative:    CM spoke with [name] (dlt) requesting the referral be sent to [facility] and [facility] again for consideration. Referrals sent via HUB. 9:40 AM CM called [facility] Seamen admission coordinator and left message. 1:15 PM CM spoke with [name]. She requested that patient return to [facility]. CM reached out [facility], no beds available and unsure of when a bed will be available. [name] updated. 2:00 PM [name] requested CM reach out to [facility].  2:30 PM [facility] does not have bed availability. [name] updated. [name] will reach out to her brother for advice with decision. 4:50 PM Patient is not going to discharge until they the stent is removed.    Expected Discharge Plan: Skilled Nursing Facility Barriers to Discharge: Continued Medical Work up               Expected Discharge Plan and Services In-house Referral: Clinical Social Work   Post Acute Care Choice: Skilled Nursing Facility Living arrangements for the past 2 months: Skilled Nursing Facility, Single Family Home                           HH Arranged: Social Work           Social Drivers of Health (SDOH) Interventions SDOH Screenings   Food Insecurity: Food Insecurity Present (01/31/2024)  Housing: Low Risk  (01/31/2024)  Transportation Needs: No Transportation Needs (01/31/2024)  Utilities: Not At Risk (01/31/2024)  Financial Resource Strain: Low Risk (11/16/2023)   Received from [facility]  Social Connections: Moderately Isolated (01/31/2024)  Tobacco Use: Low Risk  (01/31/2024)    Readmission Risk Interventions     No data to display

## 2024-02-07 DIAGNOSIS — T83511A Infection and inflammatory reaction due to indwelling urethral catheter, initial encounter: Secondary | ICD-10-CM | POA: Diagnosis not present

## 2024-02-07 DIAGNOSIS — N390 Urinary tract infection, site not specified: Secondary | ICD-10-CM | POA: Diagnosis not present

## 2024-02-07 LAB — CBC
HCT: 36.6 % (ref 36.0–46.0)
Hemoglobin: 12 g/dL (ref 12.0–15.0)
MCH: 30.7 pg (ref 26.0–34.0)
MCHC: 32.8 g/dL (ref 30.0–36.0)
MCV: 93.6 fL (ref 80.0–100.0)
Platelets: 168 K/uL (ref 150–400)
RBC: 3.91 MIL/uL (ref 3.87–5.11)
RDW: 14.4 % (ref 11.5–15.5)
WBC: 4.6 K/uL (ref 4.0–10.5)
nRBC: 0 % (ref 0.0–0.2)

## 2024-02-07 LAB — BASIC METABOLIC PANEL WITH GFR
Anion gap: 9 (ref 5–15)
BUN: 6 mg/dL — ABNORMAL LOW (ref 8–23)
CO2: 23 mmol/L (ref 22–32)
Calcium: 9.1 mg/dL (ref 8.9–10.3)
Chloride: 102 mmol/L (ref 98–111)
Creatinine, Ser: 0.62 mg/dL (ref 0.44–1.00)
GFR, Estimated: >60 mL/min (ref >60)
Glucose, Bld: 83 mg/dL (ref 70–99)
Potassium: 3.8 mmol/L (ref 3.5–5.1)
Sodium: 134 mmol/L — ABNORMAL LOW (ref 135–145)

## 2024-02-07 MED ORDER — CEFADROXIL 500 MG PO CAPS
1000.0000 mg | ORAL_CAPSULE | Freq: Two times a day (BID) | ORAL | Status: DC
  Administered 2024-02-07 – 2024-02-09 (×5): 1000 mg via ORAL
  Filled 2024-02-07 (×5): qty 2

## 2024-02-07 MED ORDER — CEFADROXIL 500 MG PO CAPS: 1000.0000 mg | ORAL_CAPSULE | Freq: Two times a day (BID) | ORAL | 0 refills | Status: AC

## 2024-02-07 MED ORDER — SENNOSIDES-DOCUSATE SODIUM 8.6-50 MG PO TABS
1.0000 | ORAL_TABLET | Freq: Two times a day (BID) | ORAL | Status: DC
  Administered 2024-02-07 – 2024-02-09 (×3): 1 via ORAL
  Filled 2024-02-07 (×3): qty 1

## 2024-02-07 NOTE — Consult Note (Signed)
 Urology Consult Note   Requesting Attending Physician:  Madelyne Owen LABOR, MD Service Providing Consult: Urology  Consulting Attending: Dr. Roseann   Reason for Consult: Retained ureteral stent, pyelonephritis  HPI: Alicia Brennan is seen in consultation for reasons noted above at the request of Regalado, Owen LABOR, MD. Patient is a 73 y.o. female presenting to [facility] emergency department, as directed from her urology office, with gross hematuria, nausea, vomiting, suprapubic pain, and chills.  PMH significant for HTN, vascular dementia, seizure disorder, and left side hydronephrosis.  She is known to our practice and [name] placed a ureteral stent on 01/03/2024 for left side hydroureteronephrosis of unclear etiology.  No obstructing stone was identified at the time.  Plan for outpatient ureteroscopy in approximately 2 weeks after she had cleared infection was in place.  She has been in hospital for 7 days at this time.  On arrival patient was somnolent, oriented, and without distress.  She did arouse and become more engaged as our conversation proceeded.  She reports to be about 50% of her norm at this time.  We reviewed the case and plan and all questions were answered to her satisfaction  ------------------  Assessment:   73 y.o. female directed to emergency department from urology office with weakness, nausea, and vomiting. Urology was contacted to consider ureteroscopy while in hospital   Recommendations: #N/V #UTI?  Unclear etiology.  Her symptoms appeared quite severe however on arrival she was afebrile, had no sign or symptoms of sepsis, labs were within normal range and vital signs remained stable.  She has remained on antibiotics.  Reviewed case and plan with [name].  She will proceed with her scheduled ureteroscopy on 02/13/2024.  Recommend keeping her on suppressive course of a comparable cephalosporin until treatment.  No acute surgical indications at this  time.  Urology will not need to follow.  Please call with questions, concerns, or acute changes.  Case and plan discussed with 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   Hypertension    IBS (irritable bowel syndrome)    IBS (irritable bowel syndrome)    Leg fracture, left    Recurrent headache    Sarcoidosis of lung    Seizures (HCC)    Vascular dementia (HCC) 01/31/2024    Past Surgical History:  Past Surgical History:  Procedure Laterality Date   BILATERAL ANTERIOR TOTAL HIP ARTHROPLASTY Bilateral 11/15/2015   Procedure: BILATERAL ANTERIOR TOTAL HIP ARTHROPLASTY;  Surgeon: Redell Shoals, MD;  Location: [facility] OR;  Service: Orthopedics;  Laterality: Bilateral;   CARPAL TUNNEL RELEASE Bilateral    CHOLECYSTECTOMY     COLONOSCOPY     CYSTOSCOPY W/ URETERAL STENT PLACEMENT Left 01/03/2024   Procedure: CYSTOSCOPY, WITH RETROGRADE PYELOGRAM AND URETERAL STENT INSERTION;  Surgeon: Devere Lonni Righter, MD;  Location: [facility] ORS;  Service: Urology;  Laterality: Left;   EYE SURGERY     r/t sarcoidisis   ORIF ELBOW FRACTURE Left    TIBIA FRACTURE SURGERY Left     Medication: Current Facility-Administered Medications  Medication Dose Route Frequency Provider Last Rate Last Admin   acetaminophen  (TYLENOL ) tablet 650 mg  650 mg Oral Q6H PRN Opyd, Timothy S, MD   650 mg at 02/03/24 2127   Or   acetaminophen  (TYLENOL ) suppository 650 mg  650 mg Rectal Q6H PRN Opyd, Timothy S, MD       artificial tears  ophthalmic solution 2 drop  2 drop Both Eyes PRN Opyd, Timothy S, MD   2 drop at 02/02/24 2136   cefTRIAXone  (ROCEPHIN ) 2 g in sodium chloride  0.9 % 100 mL IVPB  2 g Intravenous Q24H Regalado, Belkys A, MD 200 mL/hr at 02/06/24 1657 2 g at 02/06/24 1657   Chlorhexidine  Gluconate Cloth 2 % PADS 6 each  6 each Topical Daily Opyd, Timothy S, MD   6 each at 02/06/24 0934   divalproex  (DEPAKOTE ) DR tablet 250 mg  250 mg Oral  TID Opyd, Timothy S, MD   250 mg at 02/07/24 1055   hydrALAZINE  (APRESOLINE ) tablet 25 mg  25 mg Oral Q8H Opyd, Timothy S, MD   25 mg at 02/07/24 0513   irbesartan  (AVAPRO ) tablet 300 mg  300 mg Oral Daily Opyd, Timothy S, MD   300 mg at 02/07/24 1055   lacosamide  (VIMPAT ) tablet 50 mg  50 mg Oral BID Opyd, Timothy S, MD   50 mg at 02/07/24 1055   lamoTRIgine  (LAMICTAL ) tablet 25 mg  25 mg Oral BID Opyd, Timothy S, MD   25 mg at 02/07/24 1054   levETIRAcetam  (KEPPRA ) tablet 500 mg  500 mg Oral BID Opyd, Timothy S, MD   500 mg at 02/07/24 1055   prochlorperazine (COMPAZINE) injection 5 mg  5 mg Intravenous Q4H PRN Opyd, Evalene RAMAN, MD       senna-docusate (Senokot-S) tablet 1 tablet  1 tablet Oral QHS PRN Opyd, Timothy S, MD   1 tablet at 02/05/24 2156   sodium chloride  flush (NS) 0.9 % injection 3 mL  3 mL Intravenous Q12H Opyd, Evalene RAMAN, MD   3 mL at 02/06/24 2131    Allergies: No Known Allergies  Social History: Social History   Tobacco Use   Smoking status: Never   Smokeless tobacco: Never  Vaping Use   Vaping status: Never Used  Substance Use Topics   Alcohol use: No    Alcohol/week: 0.0 standard drinks of alcohol   Drug use: No    Family History Family History  Problem Relation Age of Onset   Dementia Mother    High blood pressure Father    Dementia Maternal Aunt    Sarcoidosis Son    Seizures Neg Hx     Review of Systems  Genitourinary:  Negative for dysuria, flank pain, frequency, hematuria and urgency.     Objective   Vital signs in last 24 hours: BP (!) 157/85 (BP Location: Right Arm)   Pulse 65   Temp 98.2 F (36.8 C) (Oral)   Resp 18   Ht 5' 5 (1.651 m)   Wt 82.9 kg   SpO2 99%   BMI 30.41 kg/m   Physical Exam General: A&O, resting, appropriate HEENT: [place]/AT Pulmonary: Normal work of breathing Cardiovascular: no cyanosis Abdomen: Soft, NTTP, nondistended GU: Foley catheter in place   Most Recent Labs: Lab Results  Component Value Date    WBC 4.6 02/07/2024   HGB 12.0 02/07/2024   HCT 36.6 02/07/2024   PLT 168 02/07/2024    Lab Results  Component Value Date   NA 134 (L) 02/07/2024   K 3.8 02/07/2024   CL 102 02/07/2024   CO2 23 02/07/2024   BUN 6 (L) 02/07/2024   CREATININE 0.62 02/07/2024   CALCIUM 9.1 02/07/2024   MG 2.0 02/01/2024    Lab Results  Component Value Date   INR 1.0 01/31/2024   APTT 30 11/27/2021  Urine Culture: @[id number] (laburin,org,[id number],[id number])@   IMAGING: US  RENAL Result Date: 02/06/2024 CLINICAL DATA:  Hydronephrosis EXAM: RENAL / URINARY TRACT ULTRASOUND COMPLETE COMPARISON:  CT abdomen and pelvis 01/31/2024 FINDINGS: Right Kidney: Renal measurements: 9.2 x 3.4 x 3.2 cm = volume: 51 mL. Echogenicity within normal limits. No mass or hydronephrosis visualized. Left Kidney: Renal measurements: 9.7 x 4.4 x 3.7 cm = volume: 85 mL. Echogenicity within normal limits. No mass or hydronephrosis visualized. Bladder: Not visualized. Other: None. IMPRESSION: No hydronephrosis. Electronically Signed   By: Greig Pique M.D.   On: 02/06/2024 20:22    ------  Ole Bourdon, NP Pager: [phone]   Please contact the urology consult pager with any further questions/concerns.

## 2024-02-07 NOTE — Progress Notes (Signed)
 Physical Therapy Treatment Patient Details Name: Alicia Brennan MRN: [id number] DOB: 28-Jul-1950 Today's Date: 02/07/2024   History of Present Illness 73 y.o. female  presenting with gross hematuria, nausea, vomiting, suprapubic pain, and chills, admitted from urology office d/t letharg, dx with UTI. medical history significant for hypertension, vascular dementia, seizure disorder, and left hydroureteronephrosis status post ureteral stent placement    PT Comments   Patient resting in recliner. Assisted patient to stan in sara stedy lift to facilitate leaning forward. Max support to power to stand, frequent cues for  tucking buttocks. Patient stood with UE support and  STEDY lift  support at the knees but knees were not against pad so was bearing weight. Granddaughter in to visit.  Patient reporting feeling sick and nauseated. BP 155/79. Continue PT Patient will benefit from continued inpatient follow up therapy, <3 hours/day     If plan is discharge home, recommend the following: A lot of help with bathing/dressing/bathroom;Assistance with cooking/housework;Direct supervision/assist for financial management;Direct supervision/assist for medications management;Help with stairs or ramp for entrance;Assist for transportation;A lot of help with walking and/or transfers   Can travel by private vehicle     No  Equipment Recommendations  None recommended by PT    Recommendations for Other Services       Precautions / Restrictions Precautions Precautions: Fall Recall of Precautions/Restrictions: Impaired Restrictions Weight Bearing Restrictions Per Provider Order: No     Mobility  Bed Mobility               General bed mobility comments: in recliner    Transfers Overall transfer level: Needs assistance Equipment used: Ambulation equipment used               General transfer comment: patient  seated in recliner, SaraSTEDY brought up for stnading. Max assist to place feet on  foot plates with knees flexed enough to stand. Max assist to power up to stand, using bed pad at buttocks. Patient stood x ~ 5 minutes,  able to smallstep feet back on  foot plate , cues for  erect posture.    Ambulation/Gait                   Stairs             Wheelchair Mobility     Tilt Bed    Modified Rankin (Stroke Patients Only)       Balance Overall balance assessment: Needs assistance Sitting-balance support: Feet supported, No upper extremity supported Sitting balance-Leahy Scale: Fair Sitting balance - Comments: once flexed forward could hold position Postural control: Posterior lean Standing balance support: Reliant on assistive device for balance, Bilateral upper extremity supported, During functional activity Standing balance-Leahy Scale: Poor                              Communication Communication Communication: No apparent difficulties  Cognition Arousal: Alert Behavior During Therapy: WFL for tasks assessed/performed   PT - Cognitive impairments: Problem solving, Safety/Judgement, History of cognitive impairments, Memory, Initiation                       PT - Cognition Comments: slow to process, oriented to self and to year but not to month Following commands: Impaired Following commands impaired: Follows one step commands with increased time    Cueing Cueing Techniques: Verbal cues, Gestural cues, Tactile cues  Exercises      General  Comments        Pertinent Vitals/Pain Pain Assessment Pain Assessment: No/denies pain    Home Living                          Prior Function            PT Goals (current goals can now be found in the care plan section) Progress towards PT goals: Progressing toward goals    Frequency    Min 3X/week      PT Plan      Co-evaluation              AM-PAC PT 6 Clicks Mobility   Outcome Measure  Help needed turning from your back to your side  while in a flat bed without using bedrails?: A Lot Help needed moving from lying on your back to sitting on the side of a flat bed without using bedrails?: A Lot Help needed moving to and from a bed to a chair (including a wheelchair)?: Total Help needed standing up from a chair using your arms (e.g., wheelchair or bedside chair)?: Total Help needed to walk in hospital room?: Total Help needed climbing 3-5 steps with a railing? : Total 6 Click Score: 8    End of Session Equipment Utilized During Treatment: Gait belt Activity Tolerance: Patient tolerated treatment well Patient left: in chair;with chair alarm set;with call bell/phone within reach;with family/visitor present Nurse Communication: Mobility status;Need for lift equipment PT Visit Diagnosis: Muscle weakness (generalized) (M62.81);Other abnormalities of gait and mobility (R26.89);Difficulty in walking, not elsewhere classified (R26.2)     Time: 8362-8289 PT Time Calculation (min) (ACUTE ONLY): 33 min  Charges:    $Therapeutic Activity: 23-37 mins PT General Charges $$ ACUTE PT VISIT: 1 Visit                     {[name] [facility] PT Acute Rehabilitation Services Office [phone]   Leigh Darice Norris 02/07/2024, 5:14 PM

## 2024-02-07 NOTE — Plan of Care (Signed)

## 2024-02-07 NOTE — Discharge Summary (Addendum)
 Physician Discharge Summary   Patient: Alicia Brennan MRN: [id number] DOB: February 26, 1951  Admit date:     01/31/2024  Discharge date: 02/07/24  Discharge Physician: Owen DELENA Lore   PCP: [name], Edra GRADE, MD   Recommendations at discharge:    Follow up with urology for Cystoscopy on 11/19  Discharge Diagnoses: Principal Problem:   UTI (urinary tract infection) due to urinary indwelling Foley catheter Active Problems:   Debility   Hypertension   Localization-related (focal) (partial) idiopathic epilepsy and epileptic syndromes with seizures of localized onset, not intractable, without status epilepticus (HCC)   Vascular dementia (HCC)   Obesity, Class I, BMI 30-34.9  Resolved Problems:   Gross hematuria   Nausea & vomiting  Hospital Course: 73 year old with past medical history significant for hypertension, vascular dementia, seizure disorder, left hydroureteronephrosis status post ureteral stent placement on 01/03/2024 presented with gross hematuria, nausea vomiting, suprapubic pain and chills.  She was taken to see urology the day of admission but she was so lethargic at the time that she was directed to the ED.   Evaluation in the ED UA with bacteriuria, pyuria and hematuria.  No significant acute changes on CT abdomen and pelvis.  Foley catheter was replaced in the ED, blood cultures obtained.  Patient was treated with IV ceftriaxone  and completed 5 days.  She is currently awaiting placement.   Assessment and Plan: 1-Urinary tract infection in the setting of indwelling Foley catheter - Patient presented with hematuria, altered mental status, UA with pyuria.  She was treated with 5 days of IV ceftriaxone .  CT abdomen and pelvis 11/6: Show interval placement of left ureteral stent with resolution of previous left hydroureteronephrosis.  A small amount of gas in the bladder, likely related to instrumentation.  Foley catheter in place. - Foley catheter was removed 11/11.   Failed voiding trial.  -She was supposed to follow up with Urology after stent placement last hospitalization. Urology consulted.  -Discussed with Urology, renal US  negative for hydronephrosis. Plan to discharge on antibiotics until cystoscopy next week 02/13/2024. Discharge also with foley catheter.  Discharge on Cefadroxil.   Debility: Plan for rehab   Vascular dementia: Stable Delirium precaution   Hypertension: Continue Avapro  Vomiting: Resolved   Obesity class I BMI 30-34: Need lifestyle modification           See wound care documentation below   Wound 01/03/24 1814 Pressure Injury Buttocks Right;Left Stage 2 -  Partial thickness loss of dermis presenting as a shallow open injury with a red, pink wound bed without slough. (Active)             Consultants: Urology  Procedures performed: none Disposition: Skilled nursing facility Diet recommendation:  Discharge Diet Orders (From admission, onward)     Start     Ordered   02/05/24 0000  Diet - low sodium heart healthy        02/05/24 1557           Cardiac diet DISCHARGE MEDICATION: Allergies as of 02/07/2024   No Known Allergies      Medication List     PAUSE taking these medications    Nayzilam  5 MG/0.1ML Soln Wait to take this until your doctor or other care provider tells you to start again. Generic drug: Midazolam  GIVE ONE SPRAY INTO 1 NOSTRIL. IF NO RESPONSE IN 10 MINUTES, MAY GIVE SECOND SPRAY IN OTHER NOSTRIL. DO NOT GIVE SECOND DOSE IF PATIENT HAS TROUBLE BREATHING OR EXCESSIVE SEDATION, MAX 2 DOSES/SEIZURE  EPISODE, 1 EPISODE EVERY 3 DAYS OR 5 EPISODES/MONTH Nasal for 6 Days       TAKE these medications    cefadroxil 500 MG capsule Commonly known as: DURICEF Take 2 capsules (1,000 mg total) by mouth 2 (two) times daily for 7 days.   divalproex  250 MG DR tablet Commonly known as: Depakote  Take 1 tablet (250 mg total) by mouth 3 (three) times daily.   feeding supplement Liqd Take  237 mLs by mouth 2 (two) times daily between meals.   furosemide  40 MG tablet Commonly known as: LASIX  Take 1 tablet (40 mg total) by mouth daily as needed. What changed:  when to take this reasons to take this   hydrALAZINE  25 MG tablet Commonly known as: APRESOLINE  Take 1 tablet (25 mg total) by mouth every 8 (eight) hours.   lacosamide  50 MG Tabs tablet Commonly known as: VIMPAT  Take 1 tablet (50 mg total) by mouth 2 (two) times daily.   lactulose  10 GM/15ML solution Commonly known as: CHRONULAC  Take 15 mLs (10 g total) by mouth daily.   lamoTRIgine  25 MG tablet Commonly known as: LAMICTAL  Take 25 mg by mouth 2 (two) times daily.   levETIRAcetam  500 MG tablet Commonly known as: KEPPRA  Take 1 tablet (500 mg total) by mouth 2 (two) times daily.   senna 8.6 MG tablet Commonly known as: SENOKOT Take 1 tablet by mouth at bedtime.   sertraline 25 MG tablet Commonly known as: ZOLOFT Take 25 mg by mouth daily.   telmisartan 80 MG tablet Commonly known as: MICARDIS Take 80 mg by mouth daily.               Discharge Care Instructions  (From admission, onward)           Start     Ordered   02/05/24 0000  Discharge wound care:       Comments: Wound type:  Sacrum deep tissue pressure injury with scattered scabs- evolving Pressure Injury POA: Yes Measurement: see nursing flow sheets Wound bed: deep purple maroon discoloration with scattered scabs in lateral aspect of wound Drainage (amount, consistency, odor) see nursing flow sheets Periwound: intact Dressing procedure/placement/frequency:   Cleanse with NS, pat dry.  Apply xeroform to wound bed and cover with silicone foam dressing.  Change daily.   02/05/24 1557            Discharge Exam: Filed Weights   01/31/24 2200  Weight: 82.9 kg   General; NAD  Condition at discharge: stable  The results of significant diagnostics from this hospitalization (including imaging, microbiology, ancillary and  laboratory) are listed below for reference.   Imaging Studies: US  RENAL Result Date: 02/06/2024 CLINICAL DATA:  Hydronephrosis EXAM: RENAL / URINARY TRACT ULTRASOUND COMPLETE COMPARISON:  CT abdomen and pelvis 01/31/2024 FINDINGS: Right Kidney: Renal measurements: 9.2 x 3.4 x 3.2 cm = volume: 51 mL. Echogenicity within normal limits. No mass or hydronephrosis visualized. Left Kidney: Renal measurements: 9.7 x 4.4 x 3.7 cm = volume: 85 mL. Echogenicity within normal limits. No mass or hydronephrosis visualized. Bladder: Not visualized. Other: None. IMPRESSION: No hydronephrosis. Electronically Signed   By: Greig Pique M.D.   On: 02/06/2024 20:22   CT ABDOMEN PELVIS W CONTRAST Result Date: 01/31/2024 EXAM: CT ABDOMEN AND PELVIS WITH CONTRAST 01/31/2024 07:25:30 PM TECHNIQUE: CT of the abdomen and pelvis was performed with the administration of 100 mL of iohexol (OMNIPAQUE) 300 MG/ML solution. Multiplanar reformatted images are provided for review. Automated exposure control, iterative  reconstruction, and/or weight-based adjustment of the mA/kV was utilized to reduce the radiation dose to as low as reasonably achievable. COMPARISON: Comparison with 01/02/2024. CLINICAL HISTORY: seen for hydronephrosis and managed with foley. increasing blood in foley. No stone seen the last time. FINDINGS: LOWER CHEST: Small hiatal hernia. LIVER: Hepatic steatosis. GALLBLADDER AND BILE DUCTS: Cholecystectomy. No biliary ductal dilatation. SPLEEN: No acute abnormality. PANCREAS: No acute abnormality. ADRENAL GLANDS: No acute abnormality. KIDNEYS, URETERS AND BLADDER: Interval placement of a left ureteral stent. The previous left hydroureteronephrosis has resolved. No stones in the kidneys or ureters. No perinephric or periureteral stranding. Foley catheter in the bladder. Small amount of gas in the bladder. The bladder is not well evaluated due to streak artifact from the bilateral hip arthroplasties. GI AND BOWEL: Stomach  demonstrates no acute abnormality. There is no bowel obstruction. PERITONEUM AND RETROPERITONEUM: Small volume pelvic ascites. No free air. VASCULATURE: Aorta is normal in caliber. LYMPH NODES: No lymphadenopathy. REPRODUCTIVE ORGANS: No acute abnormality. BONES AND SOFT TISSUES: Bilateral hip arthroplasties. No acute osseous abnormality. No focal soft tissue abnormality. IMPRESSION: 1. Interval placement of a left ureteral stent with resolution of previous left hydroureteronephrosis. 2. Small amount of gas in the bladder, likely related to instrumentation. Foley catheter in place. Bladder evaluation limited by streak artifact from bilateral hip arthroplasties. Electronically signed by: Norman Gatlin MD 01/31/2024 07:33 PM EST RP Workstation: [id number]   DG Chest Port 1 View Result Date: 01/31/2024 CLINICAL DATA:  Possible sepsis EXAM: PORTABLE CHEST 1 VIEW COMPARISON:  01/02/2024 FINDINGS: Enlarged cardiomediastinal silhouette with aortic atherosclerosis. No focal opacity, pleural effusion, or pneumothorax IMPRESSION: No active disease. Cardiomegaly. Electronically Signed   By: Luke Bun M.D.   On: 01/31/2024 17:20    Microbiology: Results for orders placed or performed during the hospital encounter of 01/31/24  Blood Culture (routine x 2)     Status: None   Collection Time: 01/31/24  6:00 PM   Specimen: BLOOD  Result Value Ref Range Status   Specimen Description   Final    BLOOD SITE NOT SPECIFIED Performed at [facility], [address] [place], [place] [address]    Special Requests   Final    BOTTLES DRAWN AEROBIC AND ANAEROBIC Blood Culture adequate volume Performed at [facility], [address] [place], [place] [address]    Culture   Final    NO GROWTH 5 DAYS Performed at [facility], [address] [place], [place] [address]    Report Status 02/05/2024 FINAL  Final  Culture, Urine (Do not remove urinary catheter, catheter placed  by urology or difficult to place)     Status: Abnormal   Collection Time: 01/31/24  6:38 PM   Specimen: Urine, Catheterized  Result Value Ref Range Status   Specimen Description   Final    URINE, CATHETERIZED Performed at [facility], [address] [place], [place] [address]    Special Requests   Final    NONE Performed at [facility], [address], [place], [place] [address]    Culture (A)  Final    <10,000 COLONIES/mL INSIGNIFICANT GROWTH Performed at [facility], [address], [place], [place] [address]    Report Status 02/02/2024 FINAL  Final  Blood Culture (routine x 2)     Status: None   Collection Time: 02/01/24  3:53 AM   Specimen: BLOOD  Result Value Ref Range Status   Specimen Description  Final    BLOOD BLOOD RIGHT HAND Performed at [facility], [address], [place], [place] [address]    Special Requests   Final    Blood Culture results may not be optimal due to an inadequate volume of blood received in culture bottles BOTTLES DRAWN AEROBIC ONLY Performed at [facility], [address] [place], [place] [address]    Culture   Final    NO GROWTH 5 DAYS Performed at [facility], [address], [place], [place] [address]    Report Status 02/06/2024 FINAL  Final  MRSA Next Gen by PCR, Nasal     Status: Abnormal   Collection Time: 02/02/24  9:22 AM   Specimen: Nasal Mucosa; Nasal Swab  Result Value Ref Range Status   MRSA by PCR Next Gen DETECTED (A) NOT DETECTED Final    Comment: RESULT CALLED TO, READ BACK BY AND VERIFIED WITH: ROBYNN NOVAK RN @ [phone] 02/02/24. GILBERTL (NOTE) The GeneXpert MRSA Assay (FDA approved for NASAL specimens only), is one component of a comprehensive MRSA colonization surveillance program. It is not intended to diagnose MRSA infection nor to guide or monitor treatment for MRSA infections. Test performance is not FDA approved in patients  less than 59 years old. Performed at [facility], [address], [place], [place] [address]     Labs: CBC: Recent Labs  Lab 01/31/24 1800 02/01/24 0353 02/02/24 0344 02/03/24 0300 02/04/24 0304 02/07/24 0829  WBC 5.7 5.2 4.4 4.2 3.8* 4.6  NEUTROABS 3.7  --   --   --   --   --   HGB 14.5 12.5 11.1* 10.9* 10.9* 12.0  HCT 45.2 39.6 35.9* 35.0* 34.1* 36.6  MCV 93.8 95.4 95.2 94.9 95.3 93.6  PLT 199 174 150 148* 152 168   Basic Metabolic Panel: Recent Labs  Lab 02/01/24 0353 02/02/24 0344 02/03/24 0300 02/04/24 0304 02/07/24 0829  NA 137 137 136 135 134*  K 3.3* 3.8 3.5 3.7 3.8  CL 104 106 106 106 102  CO2 22 22 22 22 23   GLUCOSE 91 84 100* 92 83  BUN 8 <5* 9 7* 6*  CREATININE 0.72 0.64 0.71 0.74 0.62  CALCIUM 9.1 8.6* 8.9 8.8* 9.1  MG 2.0  --   --   --   --    Liver Function Tests: Recent Labs  Lab 01/31/24 1800  AST 34  ALT 19  ALKPHOS 133*  BILITOT 1.1  PROT 8.5*  ALBUMIN  4.0   CBG: No results for input(s): GLUCAP in the last 168 hours.  Discharge time spent: greater than 30 minutes.  Signed: Owen DELENA Lore, MD [facility] 02/07/2024

## 2024-02-07 NOTE — Plan of Care (Signed)
  Problem: Clinical Measurements: Goal: Respiratory complications will improve Outcome: Progressing Goal: Cardiovascular complication will be avoided Outcome: Progressing   Problem: Activity: Goal: Risk for activity intolerance will decrease Outcome: Progressing   Problem: Coping: Goal: Level of anxiety will decrease Outcome: Progressing   Problem: Elimination: Goal: Will not experience complications related to bowel motility Outcome: Progressing Goal: Will not experience complications related to urinary retention Outcome: Progressing   Problem: Pain Managment: Goal: General experience of comfort will improve and/or be controlled Outcome: Progressing   Problem: Safety: Goal: Ability to remain free from injury will improve Outcome: Progressing   Problem: Skin Integrity: Goal: Risk for impaired skin integrity will decrease Outcome: Progressing

## 2024-02-07 NOTE — TOC Progression Note (Addendum)
 Transition of Care [facility]) - Progression Note    Patient Details  Name: Alicia Brennan MRN: [id number] Date of Birth: [birth year]  [facility] CM/SW Contact  [name], RN Phone Number: 02/07/2024, 11:55 AM  Clinical Narrative:    Patient failed voiding trail and foley catheter had to be replaced. Urology consulted, recommended remain on antibiotics and follow-up on 02/13/2024. CM will follow for next voiding trail. Per MD patient is ready for discharge. CM called [name] (dlt). [name] is not willing to commit to any other SNF but [facility] CM reached to [facility] .[facility] will not know if they will have a bed until tomorrow. CM will consult Delon SINNING Supervisor for assistance. 3:04 PM [name] requested that CM send referral to [facility] and call [facility] for bed availability. CM sent referral via HUB and spoke with [facility] and she will call back . 3:07 PM [facility] no beds. [name] updated. 3:15 PM CM called [facility] [phone] left message. 3:38 PM [name] requested CM reach out to [facility] [phone]. CM left message. 3:49 PM CM updated [name] that CM will be start insurance auth for [facility] and if her mother receives a bed offer with a SNF that [name] prefers by tomorrow (11/14) at 9:00 am that we may be able to reconsider choice. [name] agreed with reply ok.  Expected Discharge Plan: Skilled Nursing Facility Barriers to Discharge: Continued Medical Work up               Expected Discharge Plan and Services In-house Referral: Clinical Social Work   Post Acute Care Choice: Skilled Nursing Facility Living arrangements for the past 2 months: Skilled Nursing Facility, Single Family Home                           HH Arranged: Social Work           Social Drivers of Health (SDOH) Interventions SDOH Screenings   Food Insecurity: Food Insecurity Present (01/31/2024)   Housing: Low Risk  (01/31/2024)  Transportation Needs: No Transportation Needs (01/31/2024)  Utilities: Not At Risk (01/31/2024)  Financial Resource Strain: Low Risk (11/16/2023)   Received from [facility]  Social Connections: Moderately Isolated (01/31/2024)  Tobacco Use: Low Risk  (01/31/2024)    Readmission Risk Interventions     No data to display

## 2024-02-08 LAB — TROPONIN T, HIGH SENSITIVITY
Troponin T High Sensitivity: <15 ng/L (ref 0–19)
Troponin T High Sensitivity: <15 ng/L (ref 0–19)

## 2024-02-08 MED ORDER — ALUM & MAG HYDROXIDE-SIMETH 200-200-20 MG/5ML PO SUSP
30.0000 mL | Freq: Once | ORAL | Status: AC
  Administered 2024-02-08: 30 mL via ORAL
  Filled 2024-02-08: qty 30

## 2024-02-08 MED ORDER — PANTOPRAZOLE SODIUM 40 MG IV SOLR
40.0000 mg | Freq: Two times a day (BID) | INTRAVENOUS | Status: DC
  Administered 2024-02-08 – 2024-02-09 (×3): 40 mg via INTRAVENOUS
  Filled 2024-02-08 (×3): qty 10

## 2024-02-08 MED ORDER — PANTOPRAZOLE SODIUM 40 MG PO TBEC: 40.0000 mg | DELAYED_RELEASE_TABLET | Freq: Every day | ORAL | 1 refills | Status: AC

## 2024-02-08 NOTE — TOC Progression Note (Addendum)
 Transition of Care [facility]) - Progression Note    Patient Details  Name: Alicia Brennan MRN: [id number] Date of Birth: 1950/07/12  Transition of Care [facility]) CM/SW Contact  [name], RN Phone Number: 02/08/2024, 8:16 AM  Clinical Narrative:    CM called [name] (dtl) for update on SNF. CM started insurance auth for [employer]. Mayme Barrows ID [id number] 9:36 AM Auth still pending. Plan Auth [id number] 2:06 PM Insurance auth stilling pending. Navi requested a MD note demonstrating stability for discharge. Note uploaded. Insurance approved  Plan Auth ID [id number]  Ends  - 02/12/2024   Expected Discharge Plan: Skilled Nursing Facility Barriers to Discharge: Continued Medical Work up               Expected Discharge Plan and Services In-house Referral: Clinical Social Work   Post Acute Care Choice: Skilled Nursing Facility Living arrangements for the past 2 months: Skilled Nursing Facility, Single Family Home Expected Discharge Date: 02/07/24                         [facility] Arranged: Social Work           Social Drivers of Health (SDOH) Interventions SDOH Screenings   Food Insecurity: Food Insecurity Present (01/31/2024)  Housing: Low Risk  (01/31/2024)  Transportation Needs: No Transportation Needs (01/31/2024)  Utilities: Not At Risk (01/31/2024)  Financial Resource Strain: Low Risk (11/16/2023)   Received from [facility]  Social Connections: Moderately Isolated (01/31/2024)  Tobacco Use: Low Risk  (01/31/2024)    Readmission Risk Interventions     No data to display

## 2024-02-08 NOTE — [place] FL2 (Signed)
 [place]  MEDICAID FL2 LEVEL OF CARE FORM     IDENTIFICATION  Patient Name: Alicia Brennan Birthdate: April 05, 1950 Sex: female Admission Date (Current Location): 01/31/2024  [facility] and [place] Number:  Producer, Television/film/video and Address:  [facility],  [address], [place] [address]      Provider Number: [id number]  Attending Physician Name and Address:  Alicia Brennan LABOR, MD  Relative Name and Phone Number:  Alicia Brennan (dtl) [phone]    Current Level of Care: Hospital Recommended Level of Care: Skilled Nursing Facility Prior Approval Number:    Date Approved/Denied:   PASRR Number: [id number]  Discharge Plan: SNF    Current Diagnoses: Patient Active Problem List   Diagnosis Date Noted   Debility 02/05/2024   Obesity, Class I, BMI 30-34.9 02/05/2024   UTI (urinary tract infection) due to urinary indwelling Foley catheter 01/31/2024   Vascular dementia (HCC) 01/31/2024   Osteoarthritis of both hips 11/15/2015   Avascular necrosis of bones of both hips (HCC) 11/15/2015   Solitary pulmonary nodule 05/03/2015   Localization-related (focal) (partial) idiopathic epilepsy and epileptic syndromes with seizures of localized onset, not intractable, without status epilepticus (HCC) 04/25/2015   Cancer (HCC)    Sarcoidosis of lung    Hypertension    Recurrent headache     Orientation RESPIRATION BLADDER Height & Weight     Self, Situation, Place, Time  Normal Indwelling catheter Weight: 82.9 kg Height:  5' 5 (165.1 cm)  BEHAVIORAL SYMPTOMS/MOOD NEUROLOGICAL BOWEL NUTRITION STATUS      Continent Diet  AMBULATORY STATUS COMMUNICATION OF NEEDS Skin   Extensive Assist Verbally PU Stage and Appropriate Care   PU Stage 2 Dressing: Daily                   Personal Care Assistance Level of Assistance  Bathing, Feeding, Dressing Bathing Assistance: Maximum assistance Feeding assistance: Limited assistance Dressing Assistance: Maximum assistance      Functional Limitations Info  Sight, Hearing, Speech Sight Info: Adequate Hearing Info: Adequate Speech Info: Adequate    SPECIAL CARE FACTORS FREQUENCY  PT (By licensed PT), OT (By licensed OT)     PT Frequency: 5x weekly OT Frequency: 5x weekly            Contractures Contractures Info: Not present    Additional Factors Info  Code Status Code Status Info: FULL Allergies Info: NKA           Current Medications (02/08/2024):  This is the current hospital active medication list Current Facility-Administered Medications  Medication Dose Route Frequency Provider Last Rate Last Admin   acetaminophen  (TYLENOL ) tablet 650 mg  650 mg Oral Q6H PRN Opyd, Timothy S, MD   650 mg at 02/03/24 2127   Or   acetaminophen  (TYLENOL ) suppository 650 mg  650 mg Rectal Q6H PRN Opyd, Timothy S, MD       artificial tears ophthalmic solution 2 drop  2 drop Both Eyes PRN Opyd, Evalene RAMAN, MD   2 drop at 02/02/24 2136   cefadroxil (DURICEF) capsule 1,000 mg  1,000 mg Oral BID Regalado, Belkys A, MD   1,000 mg at 02/08/24 1116   Chlorhexidine  Gluconate Cloth 2 % PADS 6 each  6 each Topical Daily Opyd, Timothy S, MD   6 each at 02/08/24 1125   divalproex  (DEPAKOTE ) DR tablet 250 mg  250 mg Oral TID Opyd, Timothy S, MD   250 mg at 02/08/24 1116   hydrALAZINE  (APRESOLINE ) tablet 25 mg  25 mg Oral Q8H Opyd, Timothy S, MD   25 mg at 02/08/24 1416   irbesartan  (AVAPRO ) tablet 300 mg  300 mg Oral Daily Opyd, Timothy S, MD   300 mg at 02/08/24 1116   lacosamide  (VIMPAT ) tablet 50 mg  50 mg Oral BID Opyd, Timothy S, MD   50 mg at 02/08/24 1116   lamoTRIgine  (LAMICTAL ) tablet 25 mg  25 mg Oral BID Opyd, Timothy S, MD   25 mg at 02/08/24 1116   levETIRAcetam  (KEPPRA ) tablet 500 mg  500 mg Oral BID Opyd, Timothy S, MD   500 mg at 02/08/24 1116   pantoprazole (PROTONIX) injection 40 mg  40 mg Intravenous Q12H Regalado, Belkys A, MD   40 mg at 02/08/24 1115   prochlorperazine (COMPAZINE) injection 5 mg  5 mg  Intravenous Q4H PRN Opyd, Evalene RAMAN, MD       senna-docusate (Senokot-S) tablet 1 tablet  1 tablet Oral QHS PRN Opyd, Timothy S, MD   1 tablet at 02/05/24 2156   senna-docusate (Senokot-S) tablet 1 tablet  1 tablet Oral BID Regalado, Belkys A, MD   1 tablet at 02/07/24 2133   sodium chloride  flush (NS) 0.9 % injection 3 mL  3 mL Intravenous Q12H Opyd, Timothy S, MD   3 mL at 02/08/24 1118     Discharge Medications: Please see discharge summary for a list of discharge medications.  Relevant Imaging Results:  Relevant Lab Results:   Additional Information SSN   [id number]  Alicia Glenys DASEN, RN

## 2024-02-08 NOTE — Plan of Care (Signed)

## 2024-02-08 NOTE — Discharge Summary (Signed)
 Physician Discharge Summary   Patient: Alicia Brennan MRN: [id number] DOB: [birth year]  Admit date:     01/31/2024  Discharge date: 02/08/24  Discharge Physician: Owen DELENA Lore   PCP: [name], Edra GRADE, MD   Recommendations at discharge:    Follow up with urology for Cystoscopy on 11/19  Discharge Diagnoses: Principal Problem:   UTI (urinary tract infection) due to urinary indwelling Foley catheter Active Problems:   Debility   Hypertension   Localization-related (focal) (partial) idiopathic epilepsy and epileptic syndromes with seizures of localized onset, not intractable, without status epilepticus (HCC)   Vascular dementia (HCC)   Obesity, Class I, BMI 30-34.9  Resolved Problems:   Gross hematuria   Nausea & vomiting  Hospital Course: 73 year old with past medical history significant for hypertension, vascular dementia, seizure disorder, left hydroureteronephrosis status post ureteral stent placement on 01/03/2024 presented with gross hematuria, nausea vomiting, suprapubic pain and chills.  She was taken to see urology the day of admission but she was so lethargic at the time that she was directed to the ED.   Evaluation in the ED UA with bacteriuria, pyuria and hematuria.  No significant acute changes on CT abdomen and pelvis.  Foley catheter was replaced in the ED, blood cultures obtained.  Patient was treated with IV ceftriaxone  and completed 5 days.  She is currently awaiting placement.   Assessment and Plan: 1-Urinary tract infection in the setting of indwelling Foley catheter - Patient presented with hematuria, altered mental status, UA with pyuria.  She was treated with 5 days of IV ceftriaxone .  CT abdomen and pelvis 11/6: Show interval placement of left ureteral stent with resolution of previous left hydroureteronephrosis.  A small amount of gas in the bladder, likely related to instrumentation.  Foley catheter in place. - Foley catheter was removed 11/11.   Failed voiding trial.  -She was supposed to follow up with Urology after stent placement last hospitalization. Urology consulted.  -Discussed with Urology, renal US  negative for hydronephrosis. Plan to discharge on antibiotics until cystoscopy next week 02/13/2024. Discharge also with foley catheter.  Discharge on Cefadroxil.   Debility: Plan for rehab   Vascular dementia: Stable Delirium precaution   Hypertension: Continue Avapro  Vomiting: Resolved   Obesity class I BMI 30-34: Need lifestyle modification   Chest pain; report burning chest pain./  Troponin negative.  Check EKG Start PPI and Maalox.     Waiting placement.    See wound care documentation below   Wound 01/03/24 1814 Pressure Injury Buttocks Right;Left Stage 2 -  Partial thickness loss of dermis presenting as a shallow open injury with a red, pink wound bed without slough. (Active)             Consultants: Urology  Procedures performed: none Disposition: Skilled nursing facility Diet recommendation:  Discharge Diet Orders (From admission, onward)     Start     Ordered   02/05/24 0000  Diet - low sodium heart healthy        02/05/24 1557           Cardiac diet DISCHARGE MEDICATION: Allergies as of 02/08/2024   No Known Allergies      Medication List     PAUSE taking these medications    Nayzilam  5 MG/0.1ML Soln Wait to take this until your doctor or other care provider tells you to start again. Generic drug: Midazolam  GIVE ONE SPRAY INTO 1 NOSTRIL. IF NO RESPONSE IN 10 MINUTES, MAY GIVE SECOND SPRAY IN OTHER  NOSTRIL. DO NOT GIVE SECOND DOSE IF PATIENT HAS TROUBLE BREATHING OR EXCESSIVE SEDATION, MAX 2 DOSES/SEIZURE EPISODE, 1 EPISODE EVERY 3 DAYS OR 5 EPISODES/MONTH Nasal for 6 Days       TAKE these medications    cefadroxil 500 MG capsule Commonly known as: DURICEF Take 2 capsules (1,000 mg total) by mouth 2 (two) times daily for 7 days.   divalproex  250 MG DR tablet Commonly  known as: Depakote  Take 1 tablet (250 mg total) by mouth 3 (three) times daily.   feeding supplement Liqd Take 237 mLs by mouth 2 (two) times daily between meals.   furosemide  40 MG tablet Commonly known as: LASIX  Take 1 tablet (40 mg total) by mouth daily as needed. What changed:  when to take this reasons to take this   hydrALAZINE  25 MG tablet Commonly known as: APRESOLINE  Take 1 tablet (25 mg total) by mouth every 8 (eight) hours.   lacosamide  50 MG Tabs tablet Commonly known as: VIMPAT  Take 1 tablet (50 mg total) by mouth 2 (two) times daily.   lactulose  10 GM/15ML solution Commonly known as: CHRONULAC  Take 15 mLs (10 g total) by mouth daily.   lamoTRIgine  25 MG tablet Commonly known as: LAMICTAL  Take 25 mg by mouth 2 (two) times daily.   levETIRAcetam  500 MG tablet Commonly known as: KEPPRA  Take 1 tablet (500 mg total) by mouth 2 (two) times daily.   pantoprazole 40 MG tablet Commonly known as: Protonix Take 1 tablet (40 mg total) by mouth daily.   senna 8.6 MG tablet Commonly known as: SENOKOT Take 1 tablet by mouth at bedtime.   sertraline 25 MG tablet Commonly known as: ZOLOFT Take 25 mg by mouth daily.   telmisartan 80 MG tablet Commonly known as: MICARDIS Take 80 mg by mouth daily.               Discharge Care Instructions  (From admission, onward)           Start     Ordered   02/05/24 0000  Discharge wound care:       Comments: Wound type:  Sacrum deep tissue pressure injury with scattered scabs- evolving Pressure Injury POA: Yes Measurement: see nursing flow sheets Wound bed: deep purple maroon discoloration with scattered scabs in lateral aspect of wound Drainage (amount, consistency, odor) see nursing flow sheets Periwound: intact Dressing procedure/placement/frequency:   Cleanse with NS, pat dry.  Apply xeroform to wound bed and cover with silicone foam dressing.  Change daily.   02/05/24 1557            Contact  information for after-discharge care     Destination     [facility] .   Service: Skilled Nursing Contact information: [address] [place]  [address] [phone]                    Discharge Exam: Alicia Brennan   01/31/24 2200  Weight: 82.9 kg   General; NAD  Condition at discharge: stable  The results of significant diagnostics from this hospitalization (including imaging, microbiology, ancillary and laboratory) are listed below for reference.   Imaging Studies: US  RENAL Result Date: 02/06/2024 CLINICAL DATA:  Hydronephrosis EXAM: RENAL / URINARY TRACT ULTRASOUND COMPLETE COMPARISON:  CT abdomen and pelvis 01/31/2024 FINDINGS: Right Kidney: Renal measurements: 9.2 x 3.4 x 3.2 cm = volume: 51 mL. Echogenicity within normal limits. No mass or hydronephrosis visualized. Left Kidney: Renal measurements: 9.7 x 4.4 x  3.7 cm = volume: 85 mL. Echogenicity within normal limits. No mass or hydronephrosis visualized. Bladder: Not visualized. Other: None. IMPRESSION: No hydronephrosis. Electronically Signed   By: Greig Pique M.D.   On: 02/06/2024 20:22   CT ABDOMEN PELVIS W CONTRAST Result Date: 01/31/2024 EXAM: CT ABDOMEN AND PELVIS WITH CONTRAST 01/31/2024 07:25:30 PM TECHNIQUE: CT of the abdomen and pelvis was performed with the administration of 100 mL of iohexol (OMNIPAQUE) 300 MG/ML solution. Multiplanar reformatted images are provided for review. Automated exposure control, iterative reconstruction, and/or weight-based adjustment of the mA/kV was utilized to reduce the radiation dose to as low as reasonably achievable. COMPARISON: Comparison with 01/02/2024. CLINICAL HISTORY: seen for hydronephrosis and managed with foley. increasing blood in foley. No stone seen the last time. FINDINGS: LOWER CHEST: Small hiatal hernia. LIVER: Hepatic steatosis. GALLBLADDER AND BILE DUCTS: Cholecystectomy. No biliary ductal dilatation. SPLEEN: No acute  abnormality. PANCREAS: No acute abnormality. ADRENAL GLANDS: No acute abnormality. KIDNEYS, URETERS AND BLADDER: Interval placement of a left ureteral stent. The previous left hydroureteronephrosis has resolved. No stones in the kidneys or ureters. No perinephric or periureteral stranding. Foley catheter in the bladder. Small amount of gas in the bladder. The bladder is not well evaluated due to streak artifact from the bilateral hip arthroplasties. GI AND BOWEL: Stomach demonstrates no acute abnormality. There is no bowel obstruction. PERITONEUM AND RETROPERITONEUM: Small volume pelvic ascites. No free air. VASCULATURE: Aorta is normal in caliber. LYMPH NODES: No lymphadenopathy. REPRODUCTIVE ORGANS: No acute abnormality. BONES AND SOFT TISSUES: Bilateral hip arthroplasties. No acute osseous abnormality. No focal soft tissue abnormality. IMPRESSION: 1. Interval placement of a left ureteral stent with resolution of previous left hydroureteronephrosis. 2. Small amount of gas in the bladder, likely related to instrumentation. Foley catheter in place. Bladder evaluation limited by streak artifact from bilateral hip arthroplasties. Electronically signed by: Norman Gatlin MD 01/31/2024 07:33 PM EST RP Workstation: [id number]   DG Chest Port 1 View Result Date: 01/31/2024 CLINICAL DATA:  Possible sepsis EXAM: PORTABLE CHEST 1 VIEW COMPARISON:  01/02/2024 FINDINGS: Enlarged cardiomediastinal silhouette with aortic atherosclerosis. No focal opacity, pleural effusion, or pneumothorax IMPRESSION: No active disease. Cardiomegaly. Electronically Signed   By: Luke Bun M.D.   On: 01/31/2024 17:20    Microbiology: Results for orders placed or performed during the hospital encounter of 01/31/24  Blood Culture (routine x 2)     Status: None   Collection Time: 01/31/24  6:00 PM   Specimen: BLOOD  Result Value Ref Range Status   Specimen Description   Final    BLOOD SITE NOT SPECIFIED Performed at [facility], [address] [place], [place] [address]    Special Requests   Final    BOTTLES DRAWN AEROBIC AND ANAEROBIC Blood Culture adequate volume Performed at [facility], [address], [place], [place] [address]    Culture   Final    NO GROWTH 5 DAYS Performed at [facility], [address], [place], [place] [address]    Report Status 02/05/2024 FINAL  Final  Culture, Urine (Do not remove urinary catheter, catheter placed by urology or difficult to place)     Status: Abnormal   Collection Time: 01/31/24  6:38 PM   Specimen: Urine, Catheterized  Result Value Ref Range Status   Specimen Description   Final    URINE, CATHETERIZED Performed at [facility], [address], [place], [place] [address]    Special Requests  Final    NONE Performed at [facility], [address] [place], [place] [address]    Culture (A)  Final    <10,000 COLONIES/mL INSIGNIFICANT GROWTH Performed at [facility], [address] [place], [place] [address]    Report Status 02/02/2024 FINAL  Final  Blood Culture (routine x 2)     Status: None   Collection Time: 02/01/24  3:53 AM   Specimen: BLOOD  Result Value Ref Range Status   Specimen Description   Final    BLOOD BLOOD RIGHT HAND Performed at [facility], [address], [place], [place] [address]    Special Requests   Final    Blood Culture results may not be optimal due to an inadequate volume of blood received in culture bottles BOTTLES DRAWN AEROBIC ONLY Performed at [facility], [address], [place], [place] [address]    Culture   Final    NO GROWTH 5 DAYS Performed at [facility], [address] [place], [place] [address]    Report Status 02/06/2024 FINAL  Final  MRSA Next Gen by PCR, Nasal     Status: Abnormal   Collection Time: 02/02/24  9:22 AM   Specimen: Nasal Mucosa; Nasal Swab  Result Value  Ref Range Status   MRSA by PCR Next Gen DETECTED (A) NOT DETECTED Final    Comment: RESULT CALLED TO, READ BACK BY AND VERIFIED WITH: ROBYNN NOVAK RN @ [phone] 02/02/24. GILBERTL (NOTE) The GeneXpert MRSA Assay (FDA approved for NASAL specimens only), is one component of a comprehensive MRSA colonization surveillance program. It is not intended to diagnose MRSA infection nor to guide or monitor treatment for MRSA infections. Test performance is not FDA approved in patients less than 46 years old. Performed at [facility], [address] [place], [place] [address]     Labs: CBC: Recent Labs  Lab 02/02/24 0344 02/03/24 0300 02/04/24 0304 02/07/24 0829  WBC 4.4 4.2 3.8* 4.6  HGB 11.1* 10.9* 10.9* 12.0  HCT 35.9* 35.0* 34.1* 36.6  MCV 95.2 94.9 95.3 93.6  PLT 150 148* 152 168   Basic Metabolic Panel: Recent Labs  Lab 02/02/24 0344 02/03/24 0300 02/04/24 0304 02/07/24 0829  NA 137 136 135 134*  K 3.8 3.5 3.7 3.8  CL 106 106 106 102  CO2 22 22 22 23   GLUCOSE 84 100* 92 83  BUN <5* 9 7* 6*  CREATININE 0.64 0.71 0.74 0.62  CALCIUM 8.6* 8.9 8.8* 9.1   Liver Function Tests: No results for input(s): AST, ALT, ALKPHOS, BILITOT, PROT, ALBUMIN  in the last 168 hours.  CBG: No results for input(s): GLUCAP in the last 168 hours.  Discharge time spent: greater than 30 minutes.  Signed: Owen DELENA Lore, MD [facility] 02/08/2024

## 2024-02-09 NOTE — TOC Transition Note (Signed)
 Transition of Care [facility]) - Discharge Note   Patient Details  Name: Alicia Brennan MRN: [id number] Date of Birth: June 12, 1950  Transition of Care [facility]) CM/SW Contact:  Heather DELENA Saltness, LCSW Phone Number: 02/09/2024, 11:08 AM   Clinical Narrative:    Pt discharging to [facility] today for SNF rehab. Pt admitting to room 3B. D/C packet placed in pt's chart at RN station. RN to call report to [phone]. Pt can admit to facility at 2 PM, PTAR scheduled to pick up at 1:30 PM. Pt's daughter, [name] [phone], made aware and in agreement with discharge plan. No further TOC needs at this time.   Final next level of care: Skilled Nursing Facility Barriers to Discharge: Barriers Resolved   Patient Goals and CMS Choice Patient states their goals for this hospitalization and ongoing recovery are:: To go to SNF CMS Medicare.gov Compare Post Acute Care list provided to:: Patient Represenative (must comment) Choice offered to / list presented to : Adult Children [place] ownership interest in [facility].provided to:: Adult Children    Discharge Placement  Patient chooses bed at: [facility] Patient to be transferred to facility by: PTAR Name of family member notified: [name] Darling Patient and family notified of of transfer: 02/09/24  Discharge Plan and Services Additional resources added to the After Visit Summary for  Follow Up In-house Referral: Clinical Social Work   Post Acute Care Choice: Skilled Nursing Facility          DME Arranged: N/A DME Agency: NA       HH Arranged: NA HH Agency: NA        Social Drivers of Health (SDOH) Interventions SDOH Screenings   Food Insecurity: Food Insecurity Present (01/31/2024)  Housing: Low Risk  (01/31/2024)  Transportation Needs: No Transportation Needs (01/31/2024)  Utilities: Not At Risk (01/31/2024)  Financial Resource Strain: Low Risk (11/16/2023)   Received from [facility]  Social  Connections: Moderately Isolated (01/31/2024)  Tobacco Use: Low Risk  (01/31/2024)     Readmission Risk Interventions     No data to display           Signed: Heather Saltness, MSW, LCSW Clinical Social Worker Inpatient Care Management 02/09/2024 11:10 AM

## 2024-02-09 NOTE — Discharge Summary (Signed)
 Physician Discharge Summary   Patient: Alicia Brennan MRN: [id number] DOB: 1950-04-24  Admit date:     01/31/2024  Discharge date: 02/09/24  Discharge Physician: Owen DELENA Lore   PCP: [name], Edra GRADE, MD   Recommendations at discharge:    Follow up with urology for Cystoscopy on 11/19 Needs to remain on antibiotics until procedure.SABRA   Discharge Diagnoses: Principal Problem:   UTI (urinary tract infection) due to urinary indwelling Foley catheter Active Problems:   Debility   Hypertension   Localization-related (focal) (partial) idiopathic epilepsy and epileptic syndromes with seizures of localized onset, not intractable, without status epilepticus (HCC)   Vascular dementia (HCC)   Obesity, Class I, BMI 30-34.9  Resolved Problems:   Gross hematuria   Nausea & vomiting  Hospital Course: 73 year old with past medical history significant for hypertension, vascular dementia, seizure disorder, left hydroureteronephrosis status post ureteral stent placement on 01/03/2024 presented with gross hematuria, nausea vomiting, suprapubic pain and chills.  She was taken to see urology the day of admission but she was so lethargic at the time that she was directed to the ED.   Evaluation in the ED UA with bacteriuria, pyuria and hematuria.  No significant acute changes on CT abdomen and pelvis.  Foley catheter was replaced in the ED, blood cultures obtained.  Patient was treated with IV ceftriaxone  and completed 5 days.  She is currently awaiting placement.   Assessment and Plan: 1-Urinary tract infection in the setting of indwelling Foley catheter - Patient presented with hematuria, altered mental status, UA with pyuria.  She was treated with 5 days of IV ceftriaxone .  CT abdomen and pelvis 11/6: Show interval placement of left ureteral stent with resolution of previous left hydroureteronephrosis.  A small amount of gas in the bladder, likely related to instrumentation.  Foley catheter in  place. - Foley catheter was removed 11/11.  Failed voiding trial.  -She was supposed to follow up with Urology after stent placement last hospitalization. Urology consulted.  -Discussed with Urology, renal US  negative for hydronephrosis. Plan to discharge on antibiotics until cystoscopy next week 02/13/2024. Discharge also with foley catheter.  Discharge on Cefadroxil.   Debility: Plan for rehab   Vascular dementia: Stable Delirium precaution   Hypertension: Continue Avapro  Vomiting: Resolved   Obesity class I BMI 30-34: Need lifestyle modification   Chest pain; report burning chest pain./ Resolved Troponin negative.  EKG sinus Continue with PPI and Maalox.       See wound care documentation below   Wound 01/03/24 1814 Pressure Injury Buttocks Right;Left Stage 2 -  Partial thickness loss of dermis presenting as a shallow open injury with a red, pink wound bed without slough. (Active)             Consultants: Urology  Procedures performed: none Disposition: Skilled nursing facility Diet recommendation:  Discharge Diet Orders (From admission, onward)     Start     Ordered   02/05/24 0000  Diet - low sodium heart healthy        02/05/24 1557           Cardiac diet DISCHARGE MEDICATION: Allergies as of 02/09/2024   No Known Allergies      Medication List     PAUSE taking these medications    Nayzilam  5 MG/0.1ML Soln Wait to take this until your doctor or other care provider tells you to start again. Generic drug: Midazolam  GIVE ONE SPRAY INTO 1 NOSTRIL. IF NO RESPONSE IN 10 MINUTES,  MAY GIVE SECOND SPRAY IN OTHER NOSTRIL. DO NOT GIVE SECOND DOSE IF PATIENT HAS TROUBLE BREATHING OR EXCESSIVE SEDATION, MAX 2 DOSES/SEIZURE EPISODE, 1 EPISODE EVERY 3 DAYS OR 5 EPISODES/MONTH Nasal for 6 Days       TAKE these medications    cefadroxil 500 MG capsule Commonly known as: DURICEF Take 2 capsules (1,000 mg total) by mouth 2 (two) times daily for 7 days.    divalproex  250 MG DR tablet Commonly known as: Depakote  Take 1 tablet (250 mg total) by mouth 3 (three) times daily.   feeding supplement Liqd Take 237 mLs by mouth 2 (two) times daily between meals.   furosemide  40 MG tablet Commonly known as: LASIX  Take 1 tablet (40 mg total) by mouth daily as needed. What changed:  when to take this reasons to take this   hydrALAZINE  25 MG tablet Commonly known as: APRESOLINE  Take 1 tablet (25 mg total) by mouth every 8 (eight) hours.   lacosamide  50 MG Tabs tablet Commonly known as: VIMPAT  Take 1 tablet (50 mg total) by mouth 2 (two) times daily.   lactulose  10 GM/15ML solution Commonly known as: CHRONULAC  Take 15 mLs (10 g total) by mouth daily.   lamoTRIgine  25 MG tablet Commonly known as: LAMICTAL  Take 25 mg by mouth 2 (two) times daily.   levETIRAcetam  500 MG tablet Commonly known as: KEPPRA  Take 1 tablet (500 mg total) by mouth 2 (two) times daily.   pantoprazole 40 MG tablet Commonly known as: Protonix Take 1 tablet (40 mg total) by mouth daily.   senna 8.6 MG tablet Commonly known as: SENOKOT Take 1 tablet by mouth at bedtime.   sertraline 25 MG tablet Commonly known as: ZOLOFT Take 25 mg by mouth daily.   telmisartan 80 MG tablet Commonly known as: MICARDIS Take 80 mg by mouth daily.               Discharge Care Instructions  (From admission, onward)           Start     Ordered   02/05/24 0000  Discharge wound care:       Comments: Wound type:  Sacrum deep tissue pressure injury with scattered scabs- evolving Pressure Injury POA: Yes Measurement: see nursing flow sheets Wound bed: deep purple maroon discoloration with scattered scabs in lateral aspect of wound Drainage (amount, consistency, odor) see nursing flow sheets Periwound: intact Dressing procedure/placement/frequency:   Cleanse with NS, pat dry.  Apply xeroform to wound bed and cover with silicone foam dressing.  Change daily.    02/05/24 1557            Contact information for after-discharge care     Destination     [facility] .   Service: Skilled Nursing Contact information: [address] [place]  [address] [phone]                    Discharge Exam: Fredricka Weights   01/31/24 2200  Weight: 82.9 kg   General; NAD  Condition at discharge: stable  The results of significant diagnostics from this hospitalization (including imaging, microbiology, ancillary and laboratory) are listed below for reference.   Imaging Studies: US  RENAL Result Date: 02/06/2024 CLINICAL DATA:  Hydronephrosis EXAM: RENAL / URINARY TRACT ULTRASOUND COMPLETE COMPARISON:  CT abdomen and pelvis 01/31/2024 FINDINGS: Right Kidney: Renal measurements: 9.2 x 3.4 x 3.2 cm = volume: 51 mL. Echogenicity within normal limits. No mass or hydronephrosis visualized. Left Kidney:  Renal measurements: 9.7 x 4.4 x 3.7 cm = volume: 85 mL. Echogenicity within normal limits. No mass or hydronephrosis visualized. Bladder: Not visualized. Other: None. IMPRESSION: No hydronephrosis. Electronically Signed   By: Greig Pique M.D.   On: 02/06/2024 20:22   CT ABDOMEN PELVIS W CONTRAST Result Date: 01/31/2024 EXAM: CT ABDOMEN AND PELVIS WITH CONTRAST 01/31/2024 07:25:30 PM TECHNIQUE: CT of the abdomen and pelvis was performed with the administration of 100 mL of iohexol (OMNIPAQUE) 300 MG/ML solution. Multiplanar reformatted images are provided for review. Automated exposure control, iterative reconstruction, and/or weight-based adjustment of the mA/kV was utilized to reduce the radiation dose to as low as reasonably achievable. COMPARISON: Comparison with 01/02/2024. CLINICAL HISTORY: seen for hydronephrosis and managed with foley. increasing blood in foley. No stone seen the last time. FINDINGS: LOWER CHEST: Small hiatal hernia. LIVER: Hepatic steatosis. GALLBLADDER AND BILE DUCTS: Cholecystectomy. No biliary ductal  dilatation. SPLEEN: No acute abnormality. PANCREAS: No acute abnormality. ADRENAL GLANDS: No acute abnormality. KIDNEYS, URETERS AND BLADDER: Interval placement of a left ureteral stent. The previous left hydroureteronephrosis has resolved. No stones in the kidneys or ureters. No perinephric or periureteral stranding. Foley catheter in the bladder. Small amount of gas in the bladder. The bladder is not well evaluated due to streak artifact from the bilateral hip arthroplasties. GI AND BOWEL: Stomach demonstrates no acute abnormality. There is no bowel obstruction. PERITONEUM AND RETROPERITONEUM: Small volume pelvic ascites. No free air. VASCULATURE: Aorta is normal in caliber. LYMPH NODES: No lymphadenopathy. REPRODUCTIVE ORGANS: No acute abnormality. BONES AND SOFT TISSUES: Bilateral hip arthroplasties. No acute osseous abnormality. No focal soft tissue abnormality. IMPRESSION: 1. Interval placement of a left ureteral stent with resolution of previous left hydroureteronephrosis. 2. Small amount of gas in the bladder, likely related to instrumentation. Foley catheter in place. Bladder evaluation limited by streak artifact from bilateral hip arthroplasties. Electronically signed by: Norman Gatlin MD 01/31/2024 07:33 PM EST RP Workstation: [id number]   DG Chest Port 1 View Result Date: 01/31/2024 CLINICAL DATA:  Possible sepsis EXAM: PORTABLE CHEST 1 VIEW COMPARISON:  01/02/2024 FINDINGS: Enlarged cardiomediastinal silhouette with aortic atherosclerosis. No focal opacity, pleural effusion, or pneumothorax IMPRESSION: No active disease. Cardiomegaly. Electronically Signed   By: Luke Bun M.D.   On: 01/31/2024 17:20    Microbiology: Results for orders placed or performed during the hospital encounter of 01/31/24  Blood Culture (routine x 2)     Status: None   Collection Time: 01/31/24  6:00 PM   Specimen: BLOOD  Result Value Ref Range Status   Specimen Description   Final    BLOOD SITE NOT  SPECIFIED Performed at [facility], [address] [place], [place] [address]    Special Requests   Final    BOTTLES DRAWN AEROBIC AND ANAEROBIC Blood Culture adequate volume Performed at [facility], [address] [place], [place] [address]    Culture   Final    NO GROWTH 5 DAYS Performed at [facility], [address], [place], [place] [address]    Report Status 02/05/2024 FINAL  Final  Culture, Urine (Do not remove urinary catheter, catheter placed by urology or difficult to place)     Status: Abnormal   Collection Time: 01/31/24  6:38 PM   Specimen: Urine, Catheterized  Result Value Ref Range Status   Specimen Description   Final    URINE, CATHETERIZED Performed at [facility], [address] [place], [place] [address]  Special Requests   Final    NONE Performed at [facility], [address] [place], [place] [address]    Culture (A)  Final    <10,000 COLONIES/mL INSIGNIFICANT GROWTH Performed at [facility], [address], [place], [place] [address]    Report Status 02/02/2024 FINAL  Final  Blood Culture (routine x 2)     Status: None   Collection Time: 02/01/24  3:53 AM   Specimen: BLOOD  Result Value Ref Range Status   Specimen Description   Final    BLOOD BLOOD RIGHT HAND Performed at [facility], [address] [place], [place] [address]    Special Requests   Final    Blood Culture results may not be optimal due to an inadequate volume of blood received in culture bottles BOTTLES DRAWN AEROBIC ONLY Performed at [facility], [address] [place], [place] [address]    Culture   Final    NO GROWTH 5 DAYS Performed at [facility], [address], [place], [place] [address]    Report Status 02/06/2024 FINAL  Final  MRSA Next Gen by PCR, Nasal     Status: Abnormal   Collection Time: 02/02/24  9:22 AM   Specimen:  Nasal Mucosa; Nasal Swab  Result Value Ref Range Status   MRSA by PCR Next Gen DETECTED (A) NOT DETECTED Final    Comment: RESULT CALLED TO, READ BACK BY AND VERIFIED WITH: ROBYNN NOVAK RN @ [phone] 02/02/24. GILBERTL (NOTE) The GeneXpert MRSA Assay (FDA approved for NASAL specimens only), is one component of a comprehensive MRSA colonization surveillance program. It is not intended to diagnose MRSA infection nor to guide or monitor treatment for MRSA infections. Test performance is not FDA approved in patients less than 24 years old. Performed at [facility], [address], [place], [place] [address]     Labs: CBC: Recent Labs  Lab 02/03/24 0300 02/04/24 0304 02/07/24 0829  WBC 4.2 3.8* 4.6  HGB 10.9* 10.9* 12.0  HCT 35.0* 34.1* 36.6  MCV 94.9 95.3 93.6  PLT 148* 152 168   Basic Metabolic Panel: Recent Labs  Lab 02/03/24 0300 02/04/24 0304 02/07/24 0829  NA 136 135 134*  K 3.5 3.7 3.8  CL 106 106 102  CO2 22 22 23   GLUCOSE 100* 92 83  BUN 9 7* 6*  CREATININE 0.71 0.74 0.62  CALCIUM 8.9 8.8* 9.1   Liver Function Tests: No results for input(s): AST, ALT, ALKPHOS, BILITOT, PROT, ALBUMIN  in the last 168 hours.  CBG: No results for input(s): GLUCAP in the last 168 hours.  Discharge time spent: greater than 30 minutes.  Signed: Owen DELENA Lore, MD [facility] 02/09/2024

## 2024-02-09 NOTE — Progress Notes (Signed)
 PTAR here for pt pickup

## 2024-02-09 NOTE — Plan of Care (Signed)

## 2024-02-09 NOTE — Progress Notes (Signed)
 AVS completed and placed in pt DC packet.  Report called to [employer], report given to [employer], CHARITY FUNDRAISER.  All questions answered.  IV removed and belongings packed.  PTAR booked for approx [phone]

## 2024-02-11 NOTE — Progress Notes (Signed)
 For Anesthesia: PCP - Consuelo [name] [name], MD Cicero Cutting MD Cardiologist - N/A Neurologist- Amado Arcadio Rockers, FNP  Pulmonologist-Alva, Harden GAILS, MD    Bowel Prep reminder:N/A  Chest x-ray -01/31/24 in [facility]  EKG - 02/08/24 in [facility] Stress Test - N/A ECHO - 09/13/23 in [facility] Cardiac Cath - N/A Pacemaker/ICD device last checked: N/A Pacemaker orders received: N/A Device Rep notified: N/A  Spinal Cord Stimulator: N/A  Sleep Study - N/A CPAP - N/A  Fasting Blood Sugar - N/A Checks Blood Sugar __N/A___ times a day Date and result of last Hgb A1c-N/A  Last dose of GLP1 agonist- N/A GLP1 instructions: Hold 7 days prior to schedule (Hold 24 hours-daily)   Last dose of SGLT-2 inhibitors- N/A SGLT-2 instructions: Hold 72 hours prior to surgery  Blood Thinner Instructions:N/A Last Dose:N/A Time last taken:N/A  Aspirin  Instructions:N/A Last Dose:N/A Time last taken:N/A  Activity level: Can go up a flight of stairs and activities of daily living without stopping and without chest pain and/or shortness of breath      Anesthesia review: HTN, pulmonary sarcoidosis,  seizure   Patient denies shortness of breath, fever, cough and chest pain at PAT appointment   Patient verbalized understanding of instructions that were reviewed over the telephone.

## 2024-02-11 NOTE — Patient Instructions (Addendum)
 Preop instructions for: Teondra Newburg  Date of Birth: 1950/09/12                    Date of Procedure: 02/13/24  Procedure: CYSTOSCOPY/URETEROSCOPY/HOLMIUM LASER/STENT PLACEMENT       Surgeon: Dr. Lonni Han Facility contact:  [facility]     Phone:  [phone]               Health Care POA: RN contact name/phone#:  Josette Matt LPN Fax #: [phone]   Transportation contact phone#: [facility]  Please send day of procedure:  Current med list  Medications taken the day of procedure (return attached form to hospital) confirm time of nothing by mouth status (return attached form to hospital) Patient Demographic info( to include DNR status, problem list, allergies) Bring Insurance card and picture ID    Time to arrive at [facility]: 0900 AM   Report to: Admitting (On your left hand side)    Do not eat solid food or drink past midnight the night before your procedure.(To include any tube feedings-must be discontinued)  Take these morning medications only with sips of water.(or give through gastrostomy or feeding tube).  Divalproex  Hydralazine  Lacosamide  Lamotrigine  Levetiracetam  Pantoprazole. Sertraline  Stop all vitamins and herbal supplements 7 days before surgery.   Oral Hygiene is also important to reduce your risk of infection.                                    Remember - BRUSH YOUR TEETH THE MORNING OF SURGERY WITH YOUR REGULAR TOOTHPASTE   DENTURES WILL BE REMOVED PRIOR TO SURGERY PLEASE DO NOT APPLY Poly grip OR ADHESIVES!!!  Leave all jewelry and other valuables at place where living( no metal or rings to be worn) No contact lens Women-no make-up, no lotions,perfumes,powders   Any questions day of procedure,call  SHORT STAY-[phone]     Sent from :[facility]                   [phone]                   Fax:[phone]   Sent by :  Marque Bango BSN, RN

## 2024-02-12 ENCOUNTER — Encounter (HOSPITAL_COMMUNITY): Payer: Self-pay | Admitting: Urology

## 2024-02-13 ENCOUNTER — Other Ambulatory Visit: Payer: Self-pay

## 2024-02-13 ENCOUNTER — Ambulatory Visit (HOSPITAL_BASED_OUTPATIENT_CLINIC_OR_DEPARTMENT_OTHER): Admitting: Physician Assistant

## 2024-02-13 ENCOUNTER — Encounter (HOSPITAL_COMMUNITY)
Admission: RE | Disposition: A | Discharge status: Nursing Home (Includes ICF) | Payer: Self-pay | Source: Home / Self Care | Attending: Urology

## 2024-02-13 ENCOUNTER — Ambulatory Visit (HOSPITAL_COMMUNITY)

## 2024-02-13 ENCOUNTER — Ambulatory Visit (HOSPITAL_COMMUNITY)
Admission: RE | Admit: 2024-02-13 | Discharge: 2024-02-13 | Disposition: A | Discharge status: Other Acute Inpatient Hospital | Attending: Urology | Admitting: Urology

## 2024-02-13 ENCOUNTER — Ambulatory Visit (HOSPITAL_COMMUNITY): Admitting: Physician Assistant

## 2024-02-13 ENCOUNTER — Encounter (HOSPITAL_COMMUNITY): Payer: Self-pay | Admitting: Urology

## 2024-02-13 DIAGNOSIS — Z923 Personal history of irradiation: Secondary | ICD-10-CM | POA: Diagnosis not present

## 2024-02-13 DIAGNOSIS — Z85048 Personal history of other malignant neoplasm of rectum, rectosigmoid junction, and anus: Secondary | ICD-10-CM | POA: Diagnosis not present

## 2024-02-13 DIAGNOSIS — K219 Gastro-esophageal reflux disease without esophagitis: Secondary | ICD-10-CM | POA: Diagnosis not present

## 2024-02-13 DIAGNOSIS — I10 Essential (primary) hypertension: Secondary | ICD-10-CM

## 2024-02-13 DIAGNOSIS — F0390 Unspecified dementia without behavioral disturbance: Secondary | ICD-10-CM | POA: Insufficient documentation

## 2024-02-13 DIAGNOSIS — N1330 Unspecified hydronephrosis: Secondary | ICD-10-CM

## 2024-02-13 DIAGNOSIS — M1990 Unspecified osteoarthritis, unspecified site: Secondary | ICD-10-CM | POA: Diagnosis not present

## 2024-02-13 DIAGNOSIS — N201 Calculus of ureter: Secondary | ICD-10-CM | POA: Diagnosis present

## 2024-02-13 HISTORY — DX: Lymphedema, not elsewhere classified: I89.0

## 2024-02-13 HISTORY — DX: Thrombocytopenia, unspecified: D69.6

## 2024-02-13 HISTORY — DX: Unspecified open wound of unspecified buttock, initial encounter: S31.809A

## 2024-02-13 HISTORY — DX: Presence of other specified devices: Z97.8

## 2024-02-13 HISTORY — DX: Emotional lability: R45.86

## 2024-02-13 HISTORY — PX: CYSTOSCOPY/URETEROSCOPY/HOLMIUM LASER/STENT PLACEMENT: SHX6546

## 2024-02-13 SURGERY — CYSTOSCOPY/URETEROSCOPY/HOLMIUM LASER/STENT PLACEMENT
Anesthesia: General | Laterality: Left

## 2024-02-13 MED ORDER — ORAL CARE MOUTH RINSE: 15.0000 mL | Freq: Once | OROMUCOSAL | Status: AC

## 2024-02-13 MED ORDER — ACETAMINOPHEN 500 MG PO TABS
1000.0000 mg | ORAL_TABLET | Freq: Once | ORAL | Status: AC
  Administered 2024-02-13: 1000 mg via ORAL
  Filled 2024-02-13: qty 2

## 2024-02-13 MED ORDER — PROPOFOL 1000 MG/100ML IV EMUL
INTRAVENOUS | Status: AC
  Filled 2024-02-13: qty 100

## 2024-02-13 MED ORDER — LACTATED RINGERS IV SOLN: INTRAVENOUS | Status: DC

## 2024-02-13 MED ORDER — FENTANYL CITRATE (PF) 100 MCG/2ML IJ SOLN
INTRAMUSCULAR | Status: AC
  Filled 2024-02-13: qty 2

## 2024-02-13 MED ORDER — PROPOFOL 10 MG/ML IV BOLUS
INTRAVENOUS | Status: DC | PRN
  Administered 2024-02-13: 150 ug/kg/min via INTRAVENOUS
  Administered 2024-02-13: 200 mg via INTRAVENOUS

## 2024-02-13 MED ORDER — PHENYLEPHRINE 80 MCG/ML (10ML) SYRINGE FOR IV PUSH (FOR BLOOD PRESSURE SUPPORT)
PREFILLED_SYRINGE | INTRAVENOUS | Status: AC
  Filled 2024-02-13: qty 10

## 2024-02-13 MED ORDER — MEPERIDINE HCL 25 MG/ML IJ SOLN: 6.2500 mg | INTRAMUSCULAR | Status: DC | PRN

## 2024-02-13 MED ORDER — LIDOCAINE HCL (PF) 2 % IJ SOLN
INTRAMUSCULAR | Status: AC
  Filled 2024-02-13: qty 5

## 2024-02-13 MED ORDER — HYDRALAZINE HCL 20 MG/ML IJ SOLN
10.0000 mg | Freq: Once | INTRAMUSCULAR | Status: AC
  Administered 2024-02-13: 10 mg via INTRAVENOUS

## 2024-02-13 MED ORDER — PROPOFOL 500 MG/50ML IV EMUL
INTRAVENOUS | Status: AC
  Filled 2024-02-13: qty 50

## 2024-02-13 MED ORDER — LACTATED RINGERS IV SOLN: INTRAVENOUS | Status: DC | PRN

## 2024-02-13 MED ORDER — OXYCODONE HCL 5 MG PO TABS: 5.0000 mg | ORAL_TABLET | Freq: Once | ORAL | Status: DC | PRN

## 2024-02-13 MED ORDER — HYDRALAZINE HCL 20 MG/ML IJ SOLN
INTRAMUSCULAR | Status: AC
  Filled 2024-02-13: qty 1

## 2024-02-13 MED ORDER — IOHEXOL 300 MG/ML  SOLN
INTRAMUSCULAR | Status: DC | PRN
  Administered 2024-02-13: 25 mL

## 2024-02-13 MED ORDER — LIDOCAINE HCL (PF) 2 % IJ SOLN
INTRAMUSCULAR | Status: DC | PRN
Start: 2024-02-13 — End: 2024-02-13
  Administered 2024-02-13: 100 mg via INTRADERMAL

## 2024-02-13 MED ORDER — ONDANSETRON HCL 4 MG/2ML IJ SOLN
INTRAMUSCULAR | Status: DC | PRN
  Administered 2024-02-13: 4 mg via INTRAVENOUS

## 2024-02-13 MED ORDER — PROPOFOL 10 MG/ML IV BOLUS
INTRAVENOUS | Status: AC
  Filled 2024-02-13: qty 20

## 2024-02-13 MED ORDER — CHLORHEXIDINE GLUCONATE 0.12 % MT SOLN
15.0000 mL | Freq: Once | OROMUCOSAL | Status: AC
  Administered 2024-02-13: 15 mL via OROMUCOSAL

## 2024-02-13 MED ORDER — SODIUM CHLORIDE 0.9 % IR SOLN
Status: DC | PRN
  Administered 2024-02-13: 6000 mL

## 2024-02-13 MED ORDER — FENTANYL CITRATE (PF) 50 MCG/ML IJ SOSY: 25.0000 ug | PREFILLED_SYRINGE | INTRAMUSCULAR | Status: DC | PRN

## 2024-02-13 MED ORDER — ONDANSETRON HCL 4 MG/2ML IJ SOLN
INTRAMUSCULAR | Status: AC
  Filled 2024-02-13: qty 2

## 2024-02-13 MED ORDER — DEXAMETHASONE SOD PHOSPHATE PF 10 MG/ML IJ SOLN
INTRAMUSCULAR | Status: DC | PRN
  Administered 2024-02-13: 10 mg via INTRAVENOUS

## 2024-02-13 MED ORDER — OXYCODONE HCL 5 MG/5ML PO SOLN: 5.0000 mg | Freq: Once | ORAL | Status: DC | PRN

## 2024-02-13 MED ORDER — FENTANYL CITRATE (PF) 100 MCG/2ML IJ SOLN
INTRAMUSCULAR | Status: DC | PRN
  Administered 2024-02-13 (×2): 50 ug via INTRAVENOUS

## 2024-02-13 MED ORDER — CEFAZOLIN SODIUM-DEXTROSE 2-4 GM/100ML-% IV SOLN
2.0000 g | INTRAVENOUS | Status: AC
  Administered 2024-02-13: 2 g via INTRAVENOUS
  Filled 2024-02-13: qty 100

## 2024-02-13 MED ORDER — MIDAZOLAM HCL (PF) 2 MG/2ML IJ SOLN: 0.5000 mg | Freq: Once | INTRAMUSCULAR | Status: DC | PRN

## 2024-02-13 MED ORDER — PHENYLEPHRINE 80 MCG/ML (10ML) SYRINGE FOR IV PUSH (FOR BLOOD PRESSURE SUPPORT)
PREFILLED_SYRINGE | INTRAVENOUS | Status: DC | PRN
  Administered 2024-02-13: 80 ug via INTRAVENOUS

## 2024-02-13 SURGICAL SUPPLY — 19 items
BAG URO CATCHER STRL LF (MISCELLANEOUS) ×1 IMPLANT
BASKET ZERO TIP NITINOL 2.4FR (BASKET) IMPLANT
CATH FOLEY 2WAY SLVR 5CC 16FR (CATHETERS) IMPLANT
CATH URETL OPEN 5X70 (CATHETERS) ×1 IMPLANT
CLOTH BEACON ORANGE TIMEOUT ST (SAFETY) ×1 IMPLANT
EXTRACTOR STONE NITINOL NGAGE (UROLOGICAL SUPPLIES) IMPLANT
FIBER LASER MOSES 200 DFL (Laser) IMPLANT
GLOVE SURG LX STRL 8.0 MICRO (GLOVE) ×1 IMPLANT
GOWN STRL SURGICAL XL XLNG (GOWN DISPOSABLE) ×1 IMPLANT
GUIDEWIRE STR DUAL SENSOR (WIRE) IMPLANT
GUIDEWIRE ZIPWRE .038 STRAIGHT (WIRE) ×1 IMPLANT
KIT TURNOVER KIT A (KITS) ×1 IMPLANT
MANIFOLD NEPTUNE II (INSTRUMENTS) ×1 IMPLANT
PACK CYSTO (CUSTOM PROCEDURE TRAY) ×1 IMPLANT
SHEATH NAVIGATOR HD 11/13X28 (SHEATH) IMPLANT
SHEATH NAVIGATOR HD 11/13X36 (SHEATH) IMPLANT
STENT URET 6FRX26 CONTOUR (STENTS) IMPLANT
TUBING CONNECTING 10 (TUBING) ×1 IMPLANT
TUBING UROLOGY SET (TUBING) ×1 IMPLANT

## 2024-02-13 NOTE — H&P (Signed)
 Urology Preoperative H&P   Chief Complaint: Left hydronephrosis, possible ureteral stone  History of Present Illness: Alicia Brennan is a 73 y.o. female with a history of vascular dementia who is initially seen in consultation on 01/03/2024 due to left-sided hydronephrosis thought to be secondary to a UTI and possible left distal ureteral stone (her pelvis was obscured by hip artifact on CT) and is status post left ureteral stent placement at that time.  She was recently admitted to the hospital due to failure to thrive as well as a possible UTI.  She requires a chronic indwelling Foley catheter for bladder decompression due to her dementia.  Urine culture from 01/02/2024 grew multidrug-resistant E. coli.  Her urine cultures from her most recent hospitalization was ultimately negative.   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   Foley catheter in place    Hypertension    IBS (irritable bowel syndrome)    Leg fracture, left    Lymphedema    Mood disturbance    Recurrent headache    Sarcoidosis of lung    Seizures (HCC)    Thrombocytopenia    Vascular dementia (HCC) 01/31/2024   Wound of buttock    Right, pressure wound    Past Surgical History:  Procedure Laterality Date   BILATERAL ANTERIOR TOTAL HIP ARTHROPLASTY Bilateral 11/15/2015   Procedure: BILATERAL ANTERIOR TOTAL HIP ARTHROPLASTY;  Surgeon: Redell Shoals, MD;  Location: [facility] OR;  Service: Orthopedics;  Laterality: Bilateral;   CARPAL TUNNEL RELEASE Bilateral    CHOLECYSTECTOMY     COLONOSCOPY     CYSTOSCOPY W/ URETERAL STENT PLACEMENT Left 01/03/2024   Procedure: CYSTOSCOPY, WITH RETROGRADE PYELOGRAM AND URETERAL STENT INSERTION;  Surgeon: Devere Lonni Righter, MD;  Location: [facility] ORS;  Service: Urology;  Laterality: Left;   EYE SURGERY     r/t sarcoidisis   ORIF ELBOW FRACTURE Left    TIBIA FRACTURE SURGERY Left     Allergies: No Known Allergies  Family History  Problem  Relation Age of Onset   Dementia Mother    High blood pressure Father    Dementia Maternal Aunt    Sarcoidosis Son    Seizures Neg Hx     Social History:  reports that she has never smoked. She has never used smokeless tobacco. She reports that she does not drink alcohol and does not use drugs.  ROS: A complete review of systems was performed.  All systems are negative except for pertinent findings as noted.  Physical Exam:  Vital signs in last 24 hours: Temp:  [97.8 F (36.6 C)] 97.8 F (36.6 C) (11/19 0955) Pulse Rate:  [72] 72 (11/19 0955) Resp:  [18] 18 (11/19 0955) BP: (162)/(68) 162/68 (11/19 0955) SpO2:  [96 %] 96 % (11/19 0955) Weight:  [117.9 kg] 117.9 kg (11/19 1006) Constitutional:  Alert and oriented, No acute distress Cardiovascular: Regular rate and rhythm, No JVD Respiratory: Normal respiratory effort, Lungs clear bilaterally GI: Abdomen is soft, nontender, nondistended, no abdominal masses GU: No CVA tenderness Lymphatic: No lymphadenopathy Neurologic: Grossly intact, no focal deficits Psychiatric: Normal mood and affect  Laboratory Data:  No results for input(s): WBC, HGB, HCT, PLT in the last 72 hours.  No results for input(s): NA, K, CL, GLUCOSE, BUN, CALCIUM, CREATININE in the last 72 hours.  Invalid input(s): CO3   No results found for this or any previous visit (from the past 24 hours). No results found for this  or any previous visit (from the past 240 hours).  Renal Function: Recent Labs    02/07/24 0829  CREATININE 0.62   Estimated Creatinine Clearance: 77.9 mL/min (by C-G formula based on SCr of 0.62 mg/dL).  Radiologic Imaging: No results found.  I independently reviewed the above imaging studies.  Assessment and Plan Alicia Brennan is a 73 y.o. female with left-sided hydronephrosis thought to be secondary to an obstructing left distal ureteral stone  The risks, benefits and alternatives of cystoscopy with  LEFT ureteroscopy, laser lithotripsy and ureteral stent placement was discussed the patient and her daughter.  Risks included, but are not limited to: bleeding, urinary tract infection, ureteral injury/avulsion, ureteral stricture formation, retained stone fragments, the possibility that multiple surgeries may be required to treat the stone(s), MI, stroke, PE and the inherent risks of general anesthesia.  The patient's daughter voices understanding on behalf of her mother and wishes to proceed.      Lonni Han, MD 02/13/2024, 10:32 AM  [facility] Pager: [phone]

## 2024-02-13 NOTE — Op Note (Addendum)
 Operative Note  Preoperative diagnosis:  1.  Left hydronephrosis 2.  Hx of anal cancer requiring pelvic radiation in 2008  Postoperative diagnosis: 1.  Left hydronephrosis 2.  2.  Hx of anal cancer requiring pelvic radiation in 2008 3.  Stenotic left distal ureter  Procedure(s): 1.  Cystoscopy with left ureteroscopy and left ureteral stent exchange 2.  Left retrograde pyelogram with intraoperative interpretation of fluoroscopic imaging  Surgeon: Lonni Han, MD  Assistants:  None  Anesthesia:  General  Complications:  None  EBL: Less than 5 mL  Specimens: 1.  Previously placed left ureteral stent was removed intact, inspected and discarded  Drains/Catheters: 1.  Left 6 French, 24 cm JJ stent without tether  Intraoperative findings:   Slightly edematous bladder mucosa surrounding the left ureteral orifice likely related to prolonged stent placement Left retrograde pyelogram revealed a stenotic area within the distal to mid aspect of the left ureter with no evidence of an obstructing stone or ureteral mass.  No filling defects within the left renal pelvis or the associated calyces. During ureteroscopy, the left distal to mid ureter was found to be stenotic and was somewhat difficult to advance the semirigid ureteroscope beyond the level of the mid ureter.  There was a 1 to 2 cm area of posterior mucosal disruption within the mid ureter following advancement of the semirigid ureteroscope.  No obstructing stone or ureteral mass could be identified.  Indication:  Alicia Brennan is a 73 y.o. female with a history of left-sided hydronephrosis seen on CT from October presumably secondary to an obstructing stone as her hip prosthesis obscured the distal aspects of her left ureter on her scan.  She also has a significant past medical history of vascular dementia and requires an indwelling Foley catheter due to incontinence along with recurrent urinary tract infections and anal cancer  treated via XRT in 2008.  The patient's daughter has been consented for the above procedures, voices understanding and wishes to proceed.  Description of procedure:  After informed consent was obtained, the patient was brought to the operating room and general LMA anesthesia was administered. The patient was then placed in the dorsolithotomy position and prepped and draped in the usual sterile fashion. A timeout was performed. A 21 French rigid cystoscope was then inserted into the urethral meatus and advanced into the bladder under direct vision. A complete bladder survey revealed no intravesical pathology.  Her previously placed left ureteral stent was grasped at its distal curl and retracted to the urethral meatus.  A Glidewire was then advanced through the lumen of the stent and up to the left renal pelvis, under fluoroscopic guidance.  The previously placed stent was then removed intact, inspected and discarded.  A semirigid ureteroscope was then advanced into the bladder and into the distal aspects of the left ureter.  Upon reaching the level of the pelvic brim, the caliber of the ureter became somewhat stenotic and fixed making advancement of the semirigid ureteroscope difficult.  I placed an additional Glidewire through the scope and up to the left renal pelvis, confirming placement via fluoroscopy.  The ureteroscope was then advanced over the wire and into the mid aspects of the left ureter where no obstructing stone or luminal mass was identified.  There was a 1 to 2 cm area of mucosal disruption involving the posterior aspect of the left ureter following ureteroscopy.  The semirigid ureteroscope was then removed, leaving the Glidewire in place.  A 5 French ureteral catheter was then  advanced over the Glidewire and into position within the proximal aspects of the left ureter.  A retrograde pyelogram was obtained, with the findings listed above.  A new 6 French, 24 cm JJ stent was then advanced  over the guidewire and into good position within the left collecting system, confirming placement via fluoroscopy.  The patient's bladder was drained.  A 16 French Foley catheter was then inserted into the bladder and placed to gravity drainage.  She tolerated the procedure well was transferred to the postanesthesia in stable condition.  Plan: Follow-up in 1 month for cystoscopy and left stent removal along with catheter exchange

## 2024-02-13 NOTE — Anesthesia Postprocedure Evaluation (Signed)
 Anesthesia Post Note  Patient: Kasidy Gianino  Procedure(s) Performed: CYSTOSCOPY/URETEROSCOPY/HOLMIUM LASER/STENT PLACEMENT (Left)     Patient location during evaluation: PACU Anesthesia Type: General Level of consciousness: sedated, oriented and patient cooperative Pain management: pain level controlled Vital Signs Assessment: post-procedure vital signs reviewed and stable Respiratory status: spontaneous breathing, nonlabored ventilation, respiratory function stable and patient connected to nasal cannula oxygen Cardiovascular status: blood pressure returned to baseline and stable Postop Assessment: no apparent nausea or vomiting Anesthetic complications: no   No notable events documented.  Last Vitals:  Vitals:   02/13/24 1615 02/13/24 1630  BP: (!) 190/75 (!) 188/81  Pulse: 67 69  Resp: 16 17  Temp:    SpO2: 94% 94%    Last Pain:  Vitals:   02/13/24 1630  TempSrc:   PainSc: 0-No pain                 Hobie Kohles,E. Zuleica Seith

## 2024-02-13 NOTE — Anesthesia Procedure Notes (Signed)
 Procedure Name: LMA Insertion Date/Time: 02/13/2024 12:06 PM  Performed by: Obadiah Reyes BROCKS, CRNAPre-anesthesia Checklist: Patient identified, Emergency Drugs available, Suction available and Patient being monitored Patient Re-evaluated:Patient Re-evaluated prior to induction Oxygen Delivery Method: Circle System Utilized Preoxygenation: Pre-oxygenation with 100% oxygen Induction Type: IV induction Ventilation: Mask ventilation without difficulty LMA: LMA inserted LMA Size: 4.0 Number of attempts: 1 Airway Equipment and Method: Bite block Placement Confirmation: positive ETCO2 Tube secured with: Tape Dental Injury: Teeth and Oropharynx as per pre-operative assessment

## 2024-02-13 NOTE — Transfer of Care (Signed)
 Immediate Anesthesia Transfer of Care Note  Patient: Alicia Brennan  Procedure(s) Performed: CYSTOSCOPY/URETEROSCOPY/HOLMIUM LASER/STENT PLACEMENT (Left)  Patient Location: PACU  Anesthesia Type:General  Level of Consciousness: sedated and responds to stimulation  Airway & Oxygen Therapy: Patient Spontanous Breathing and Patient connected to face mask oxygen  Post-op Assessment: Report given to RN and Post -op Vital signs reviewed and stable  Post vital signs: Reviewed and stable  Last Vitals:  Vitals Value Taken Time  BP 108/65 02/13/24 13:15  Temp    Pulse 64 02/13/24 13:17  Resp 18 02/13/24 13:17  SpO2 100 % 02/13/24 13:17  Vitals shown include unfiled device data.  Last Pain:  Vitals:   02/13/24 0955  TempSrc: Oral         Complications: No notable events documented.

## 2024-02-13 NOTE — Anesthesia Preprocedure Evaluation (Signed)
 Anesthesia Evaluation  Patient identified by MRN, date of birth, ID band Patient awake and Patient confused    Reviewed: Allergy & Precautions, NPO status , Patient's Chart, lab work & pertinent test results  Airway Mallampati: II  TM Distance: >3 FB Neck ROM: Full    Dental  (+) Dental Advisory Given   Pulmonary    breath sounds clear to auscultation       Cardiovascular hypertension, Pt. on medications  Rhythm:Regular Rate:Normal  08/2023 ECHO: 60 to 65%.  1. The LV has normal function, no regional wall motion abnormalities. There is mild concentric LVH with moderate hypertrophy of basilar septal segment. Left ventricular diastolic parameters were normal.   2. RVF is normal. The right ventricular size is normal.   3. The mitral valve is normal in structure. Trivial mitral valve regurgitation. No evidence of mitral stenosis.   4. The aortic valve is tricuspid. Aortic valve regurgitation is not  visualized. Aortic valve sclerosis/calcification is present, without any  evidence of aortic stenosis.     Neuro/Psych Seizures -,  PSYCHIATRIC DISORDERS     Dementia    GI/Hepatic Neg liver ROS,GERD  Medicated,,  Endo/Other  BMI 44.6  Renal/GU negative Renal ROS     Musculoskeletal  (+) Arthritis ,    Abdominal   Peds  Hematology Hb 12.0, plt 168k   Anesthesia Other Findings   Reproductive/Obstetrics                              Anesthesia Physical Anesthesia Plan  ASA: 3  Anesthesia Plan: General   Post-op Pain Management: Minimal or no pain anticipated   Induction: Intravenous  PONV Risk Score and Plan: 3 and Ondansetron  and Dexamethasone   Airway Management Planned: LMA  Additional Equipment: None  Intra-op Plan:   Post-operative Plan:   Informed Consent: I have reviewed the patients History and Physical, chart, labs and discussed the procedure including the risks, benefits and  alternatives for the proposed anesthesia with the patient or authorized representative who has indicated his/her understanding and acceptance.     Dental advisory given and Consent reviewed with POA  Plan Discussed with: Surgeon and CRNA  Anesthesia Plan Comments:          Anesthesia Quick Evaluation

## 2024-02-14 ENCOUNTER — Encounter (HOSPITAL_COMMUNITY): Payer: Self-pay | Admitting: Urology

## 2024-03-23 ENCOUNTER — Inpatient Hospital Stay
Admission: EM | Admit: 2024-03-23 | Discharge: 2024-03-31 | DRG: 100 | Source: Skilled Nursing Facility | Attending: Hospitalist | Admitting: Hospitalist

## 2024-03-23 ENCOUNTER — Other Ambulatory Visit: Payer: Self-pay

## 2024-03-23 ENCOUNTER — Emergency Department

## 2024-03-23 DIAGNOSIS — E162 Hypoglycemia, unspecified: Secondary | ICD-10-CM | POA: Diagnosis present

## 2024-03-23 DIAGNOSIS — Z96643 Presence of artificial hip joint, bilateral: Secondary | ICD-10-CM | POA: Diagnosis present

## 2024-03-23 DIAGNOSIS — R4182 Altered mental status, unspecified: Principal | ICD-10-CM

## 2024-03-23 DIAGNOSIS — A4150 Gram-negative sepsis, unspecified: Secondary | ICD-10-CM | POA: Diagnosis present

## 2024-03-23 DIAGNOSIS — I959 Hypotension, unspecified: Secondary | ICD-10-CM | POA: Diagnosis not present

## 2024-03-23 DIAGNOSIS — I10 Essential (primary) hypertension: Secondary | ICD-10-CM | POA: Diagnosis present

## 2024-03-23 DIAGNOSIS — D860 Sarcoidosis of lung: Secondary | ICD-10-CM | POA: Diagnosis present

## 2024-03-23 DIAGNOSIS — F32A Depression, unspecified: Secondary | ICD-10-CM | POA: Diagnosis present

## 2024-03-23 DIAGNOSIS — R509 Fever, unspecified: Secondary | ICD-10-CM

## 2024-03-23 DIAGNOSIS — Z9049 Acquired absence of other specified parts of digestive tract: Secondary | ICD-10-CM

## 2024-03-23 DIAGNOSIS — R531 Weakness: Secondary | ICD-10-CM | POA: Diagnosis present

## 2024-03-23 DIAGNOSIS — R6510 Systemic inflammatory response syndrome (SIRS) of non-infectious origin without acute organ dysfunction: Secondary | ICD-10-CM | POA: Diagnosis present

## 2024-03-23 DIAGNOSIS — Z79899 Other long term (current) drug therapy: Secondary | ICD-10-CM

## 2024-03-23 DIAGNOSIS — G40909 Epilepsy, unspecified, not intractable, without status epilepticus: Principal | ICD-10-CM | POA: Diagnosis present

## 2024-03-23 DIAGNOSIS — Z85048 Personal history of other malignant neoplasm of rectum, rectosigmoid junction, and anus: Secondary | ICD-10-CM

## 2024-03-23 DIAGNOSIS — G9341 Metabolic encephalopathy: Secondary | ICD-10-CM | POA: Diagnosis present

## 2024-03-23 DIAGNOSIS — K219 Gastro-esophageal reflux disease without esophagitis: Secondary | ICD-10-CM | POA: Diagnosis present

## 2024-03-23 DIAGNOSIS — E876 Hypokalemia: Secondary | ICD-10-CM | POA: Diagnosis present

## 2024-03-23 DIAGNOSIS — M21371 Foot drop, right foot: Secondary | ICD-10-CM | POA: Diagnosis present

## 2024-03-23 DIAGNOSIS — N390 Urinary tract infection, site not specified: Secondary | ICD-10-CM | POA: Diagnosis present

## 2024-03-23 DIAGNOSIS — R8271 Bacteriuria: Secondary | ICD-10-CM | POA: Diagnosis present

## 2024-03-23 DIAGNOSIS — F0153 Vascular dementia, unspecified severity, with mood disturbance: Secondary | ICD-10-CM | POA: Diagnosis present

## 2024-03-23 LAB — COMPREHENSIVE METABOLIC PANEL WITH GFR
ALT: 11 U/L (ref 0–44)
AST: 32 U/L (ref 15–41)
Albumin: 2.9 g/dL — ABNORMAL LOW (ref 3.5–5.0)
Alkaline Phosphatase: 69 U/L (ref 38–126)
Anion gap: 14 (ref 5–15)
BUN: 10 mg/dL (ref 8–23)
CO2: 19 mmol/L — ABNORMAL LOW (ref 22–32)
Calcium: 8.1 mg/dL — ABNORMAL LOW (ref 8.9–10.3)
Chloride: 110 mmol/L (ref 98–111)
Creatinine, Ser: 0.65 mg/dL (ref 0.44–1.00)
GFR, Estimated: >60 mL/min
Glucose, Bld: 63 mg/dL — ABNORMAL LOW (ref 70–99)
Potassium: 3.1 mmol/L — ABNORMAL LOW (ref 3.5–5.1)
Sodium: 142 mmol/L (ref 135–145)
Total Bilirubin: 0.8 mg/dL (ref 0.0–1.2)
Total Protein: 6.5 g/dL (ref 6.5–8.1)

## 2024-03-23 LAB — CBC
HCT: 43.1 % (ref 36.0–46.0)
Hemoglobin: 13.6 g/dL (ref 12.0–15.0)
MCH: 30.1 pg (ref 26.0–34.0)
MCHC: 31.6 g/dL (ref 30.0–36.0)
MCV: 95.4 fL (ref 80.0–100.0)
Platelets: 217 K/uL (ref 150–400)
RBC: 4.52 MIL/uL (ref 3.87–5.11)
RDW: 13.8 % (ref 11.5–15.5)
WBC: 6.1 K/uL (ref 4.0–10.5)
nRBC: 0 % (ref 0.0–0.2)

## 2024-03-23 LAB — RESP PANEL BY RT-PCR (RSV, FLU A&B, COVID)  RVPGX2
Influenza A by PCR: NEGATIVE
Influenza B by PCR: NEGATIVE
Resp Syncytial Virus by PCR: NEGATIVE
SARS Coronavirus 2 by RT PCR: NEGATIVE

## 2024-03-23 MED ORDER — SODIUM CHLORIDE 0.9 % IV BOLUS
1000.0000 mL | Freq: Once | INTRAVENOUS | Status: AC
  Administered 2024-03-23: 1000 mL via INTRAVENOUS

## 2024-03-23 NOTE — ED Triage Notes (Signed)
 As per ems the patient has no idea as to the based line. The patient last known normal is unknown and baseline normal is unknown.

## 2024-03-23 NOTE — ED Provider Notes (Signed)
 "  [facility] Provider Note    Event Date/Time   First MD Initiated Contact with Patient 03/23/24 2040     (approximate)  History   Chief Complaint: Altered Mental Status (As per EMS the patient altered)  HPI  Felissa Blouch is a 73 y.o. female with a pmhx of htn, seizures, vascular dementia who presents to the ED for AMS.  According to EMS pt is coming from [place].  Has a hx of dementia with varying reports from staff as to what her baseline mental state is.  EMS reported some staff thought she was close to baseline and others said she could normally answer or attempt to answer questions.  Daughter was informed by SNF and requested her be sent here for eval.  Here pt is awake.  Will look at you when you speak to her but is not answering any questions and not following commands.  Unknown last normal if this is a change from baseline.   Physical Exam   Triage Vital Signs: ED Triage Vitals  Encounter Vitals Group     BP 03/23/24 2035 (!) 169/73     Girls Systolic BP Percentile --      Girls Diastolic BP Percentile --      Boys Systolic BP Percentile --      Boys Diastolic BP Percentile --      Pulse Rate 03/23/24 2035 88     Resp 03/23/24 2035 19     Temp 03/23/24 2035 99.6 F (37.6 C)     Temp Source 03/23/24 2035 Oral     SpO2 --      Weight --      Height --      Head Circumference --      Peak Flow --      Pain Score 03/23/24 2040 0     Pain Loc --      Pain Education --      Exclude from Growth Chart --     Most recent vital signs: Vitals:   03/23/24 2035  BP: (!) 169/73  Pulse: 88  Resp: 19  Temp: 99.6 F (37.6 C)    General: Awake, no distress, will not answer questions or follow commands.   CV:  Good peripheral perfusion.  Regular rate and rhythm  Resp:  Normal effort.  Equal breath sounds bilaterally.  Abd:  No distention.  Soft, nontender.  No rebound or guarding.  ED Results / Procedures / Treatments    RADIOLOGY  I reviewed and interpreted the CT head images.  No obvious bleed seen on my evaluation.  Large ventricles. Radiologist read the CT scan is negative for acute abnormality.  MEDICATIONS ORDERED IN ED: Medications - No data to display   IMPRESSION / MDM / ASSESSMENT AND PLAN / ED COURSE  I reviewed the triage vital signs and the nursing notes.  Patient's presentation is most consistent with acute presentation with potential threat to life or bodily function.  Pt with AMS from SNF with hx of vascular dementia, unknown last normal.  Awaiting family arrival for futher hx.  We will check labs including blood, urine, ct head given hx of vascular dementia, covid/flu.  We will IV hydrate and cont to closely monitor while awaiting results and additional hx.   CBC is normal.  Respiratory panel negative.  CT scan of the head negative.  I was able to talk to the patient's son over the phone.  He states at baseline  patient is largely bedbound but will talk and converse with the often times she will no even details such as the date but sometimes she will not.  Today seems like a major change from her baseline.  The son states the patient did have a ureteral stent removed approximately 2 weeks ago.  She has a temperature of 99.6 in the emergency department will obtain an In-N-Out cath urine sample to evaluate for possible urinary tract infection which could be the cause of the altered mental status.  Remainder of the workup is pending, patient care signed out to oncoming provider.    FINAL CLINICAL IMPRESSION(S) / ED DIAGNOSES   Confusion  Note:  This document was prepared using Dragon voice recognition software and may include unintentional dictation errors.   Dorothyann Drivers, MD 03/23/24 2321  "

## 2024-03-24 ENCOUNTER — Emergency Department

## 2024-03-24 DIAGNOSIS — B999 Unspecified infectious disease: Secondary | ICD-10-CM | POA: Diagnosis not present

## 2024-03-24 DIAGNOSIS — E162 Hypoglycemia, unspecified: Secondary | ICD-10-CM | POA: Diagnosis present

## 2024-03-24 DIAGNOSIS — F03C0 Unspecified dementia, severe, without behavioral disturbance, psychotic disturbance, mood disturbance, and anxiety: Secondary | ICD-10-CM | POA: Diagnosis not present

## 2024-03-24 DIAGNOSIS — R6510 Systemic inflammatory response syndrome (SIRS) of non-infectious origin without acute organ dysfunction: Secondary | ICD-10-CM | POA: Diagnosis present

## 2024-03-24 DIAGNOSIS — E870 Hyperosmolality and hypernatremia: Secondary | ICD-10-CM | POA: Diagnosis not present

## 2024-03-24 DIAGNOSIS — A4150 Gram-negative sepsis, unspecified: Secondary | ICD-10-CM | POA: Diagnosis not present

## 2024-03-24 DIAGNOSIS — R609 Edema, unspecified: Secondary | ICD-10-CM | POA: Diagnosis not present

## 2024-03-24 DIAGNOSIS — G40802 Other epilepsy, not intractable, without status epilepticus: Secondary | ICD-10-CM | POA: Diagnosis not present

## 2024-03-24 DIAGNOSIS — K219 Gastro-esophageal reflux disease without esophagitis: Secondary | ICD-10-CM | POA: Diagnosis present

## 2024-03-24 DIAGNOSIS — Z96643 Presence of artificial hip joint, bilateral: Secondary | ICD-10-CM | POA: Diagnosis present

## 2024-03-24 DIAGNOSIS — F39 Unspecified mood [affective] disorder: Secondary | ICD-10-CM | POA: Diagnosis not present

## 2024-03-24 DIAGNOSIS — D860 Sarcoidosis of lung: Secondary | ICD-10-CM | POA: Diagnosis present

## 2024-03-24 DIAGNOSIS — F32A Depression, unspecified: Secondary | ICD-10-CM | POA: Diagnosis present

## 2024-03-24 DIAGNOSIS — G40801 Other epilepsy, not intractable, with status epilepticus: Secondary | ICD-10-CM | POA: Diagnosis not present

## 2024-03-24 DIAGNOSIS — N390 Urinary tract infection, site not specified: Secondary | ICD-10-CM | POA: Diagnosis not present

## 2024-03-24 DIAGNOSIS — G9341 Metabolic encephalopathy: Secondary | ICD-10-CM | POA: Diagnosis present

## 2024-03-24 DIAGNOSIS — I959 Hypotension, unspecified: Secondary | ICD-10-CM | POA: Diagnosis not present

## 2024-03-24 DIAGNOSIS — Z515 Encounter for palliative care: Secondary | ICD-10-CM | POA: Diagnosis not present

## 2024-03-24 DIAGNOSIS — G40919 Epilepsy, unspecified, intractable, without status epilepticus: Secondary | ICD-10-CM | POA: Diagnosis not present

## 2024-03-24 DIAGNOSIS — F0153 Vascular dementia, unspecified severity, with mood disturbance: Secondary | ICD-10-CM | POA: Diagnosis present

## 2024-03-24 DIAGNOSIS — Z9049 Acquired absence of other specified parts of digestive tract: Secondary | ICD-10-CM | POA: Diagnosis not present

## 2024-03-24 DIAGNOSIS — Z66 Do not resuscitate: Secondary | ICD-10-CM | POA: Diagnosis not present

## 2024-03-24 DIAGNOSIS — E46 Unspecified protein-calorie malnutrition: Secondary | ICD-10-CM | POA: Diagnosis not present

## 2024-03-24 DIAGNOSIS — Z7189 Other specified counseling: Secondary | ICD-10-CM | POA: Diagnosis not present

## 2024-03-24 DIAGNOSIS — E876 Hypokalemia: Secondary | ICD-10-CM | POA: Diagnosis present

## 2024-03-24 DIAGNOSIS — M21371 Foot drop, right foot: Secondary | ICD-10-CM | POA: Diagnosis present

## 2024-03-24 DIAGNOSIS — G40909 Epilepsy, unspecified, not intractable, without status epilepticus: Secondary | ICD-10-CM

## 2024-03-24 DIAGNOSIS — Z79899 Other long term (current) drug therapy: Secondary | ICD-10-CM | POA: Diagnosis not present

## 2024-03-24 DIAGNOSIS — R8271 Bacteriuria: Secondary | ICD-10-CM | POA: Diagnosis present

## 2024-03-24 DIAGNOSIS — R531 Weakness: Secondary | ICD-10-CM | POA: Diagnosis present

## 2024-03-24 DIAGNOSIS — I10 Essential (primary) hypertension: Secondary | ICD-10-CM | POA: Diagnosis present

## 2024-03-24 DIAGNOSIS — N179 Acute kidney failure, unspecified: Secondary | ICD-10-CM | POA: Diagnosis not present

## 2024-03-24 DIAGNOSIS — E860 Dehydration: Secondary | ICD-10-CM | POA: Diagnosis not present

## 2024-03-24 DIAGNOSIS — F0390 Unspecified dementia without behavioral disturbance: Secondary | ICD-10-CM | POA: Diagnosis not present

## 2024-03-24 DIAGNOSIS — R509 Fever, unspecified: Secondary | ICD-10-CM | POA: Diagnosis not present

## 2024-03-24 DIAGNOSIS — Z85048 Personal history of other malignant neoplasm of rectum, rectosigmoid junction, and anus: Secondary | ICD-10-CM | POA: Diagnosis not present

## 2024-03-24 DIAGNOSIS — R569 Unspecified convulsions: Secondary | ICD-10-CM | POA: Diagnosis not present

## 2024-03-24 LAB — RESPIRATORY PANEL BY PCR

## 2024-03-24 LAB — BASIC METABOLIC PANEL WITH GFR
Anion gap: 12 (ref 5–15)
BUN: 10 mg/dL (ref 8–23)
CO2: 24 mmol/L (ref 22–32)
Calcium: 9.4 mg/dL (ref 8.9–10.3)
Chloride: 105 mmol/L (ref 98–111)
Creatinine, Ser: 0.79 mg/dL (ref 0.44–1.00)
GFR, Estimated: >60 mL/min
Glucose, Bld: 141 mg/dL — ABNORMAL HIGH (ref 70–99)
Potassium: 4 mmol/L (ref 3.5–5.1)
Sodium: 140 mmol/L (ref 135–145)

## 2024-03-24 LAB — CBG MONITORING, ED: Glucose-Capillary: 67 mg/dL — ABNORMAL LOW (ref 70–99)

## 2024-03-24 LAB — GLUCOSE, CAPILLARY
Glucose-Capillary: 96 mg/dL (ref 70–99)
Glucose-Capillary: 82 mg/dL (ref 70–99)
Glucose-Capillary: 152 mg/dL — ABNORMAL HIGH (ref 70–99)
Glucose-Capillary: 123 mg/dL — ABNORMAL HIGH (ref 70–99)
Glucose-Capillary: 140 mg/dL — ABNORMAL HIGH (ref 70–99)

## 2024-03-24 LAB — CBC
HCT: 39.5 % (ref 36.0–46.0)
Hemoglobin: 12.5 g/dL (ref 12.0–15.0)
MCH: 29.8 pg (ref 26.0–34.0)
MCHC: 31.6 g/dL (ref 30.0–36.0)
MCV: 94.3 fL (ref 80.0–100.0)
Platelets: 210 K/uL (ref 150–400)
RBC: 4.19 MIL/uL (ref 3.87–5.11)
RDW: 13.8 % (ref 11.5–15.5)
WBC: 5.9 K/uL (ref 4.0–10.5)
nRBC: 0 % (ref 0.0–0.2)

## 2024-03-24 LAB — PROTIME-INR
INR: 1.2 (ref 0.8–1.2)
Prothrombin Time: 15.7 s — ABNORMAL HIGH (ref 11.4–15.2)

## 2024-03-24 LAB — URINALYSIS, COMPLETE (UACMP) WITH MICROSCOPIC
Bilirubin Urine: NEGATIVE
Glucose, UA: NEGATIVE mg/dL
Ketones, ur: NEGATIVE mg/dL
Nitrite: NEGATIVE
Protein, ur: 100 mg/dL — AB
RBC / HPF: >50 RBC/hpf (ref 0–5)
Specific Gravity, Urine: 1.009 (ref 1.005–1.030)
Squamous Epithelial / HPF: 0 /HPF (ref 0–5)
WBC, UA: >50 WBC/hpf (ref 0–5)
pH: 6 (ref 5.0–8.0)

## 2024-03-24 LAB — CORTISOL-AM, BLOOD: Cortisol - AM: 17.2 ug/dL (ref 6.7–22.6)

## 2024-03-24 LAB — LACTIC ACID, PLASMA: Lactic Acid, Venous: 1 mmol/L (ref 0.5–1.9)

## 2024-03-24 MED ORDER — ENSURE PLUS HIGH PROTEIN PO LIQD
237.0000 mL | Freq: Two times a day (BID) | ORAL | Status: DC
  Administered 2024-03-24 – 2024-03-29 (×8): 237 mL via ORAL

## 2024-03-24 MED ORDER — LACTATED RINGERS IV SOLN: 150.0000 mL/h | INTRAVENOUS | Status: DC

## 2024-03-24 MED ORDER — LACTULOSE 10 GM/15ML PO SOLN
10.0000 g | Freq: Every day | ORAL | Status: DC
  Administered 2024-03-24 – 2024-03-30 (×7): 10 g via ORAL
  Filled 2024-03-24 (×8): qty 30

## 2024-03-24 MED ORDER — LACOSAMIDE 50 MG PO TABS
50.0000 mg | ORAL_TABLET | Freq: Two times a day (BID) | ORAL | Status: DC
  Administered 2024-03-24 – 2024-03-30 (×13): 50 mg via ORAL
  Filled 2024-03-24 (×13): qty 1

## 2024-03-24 MED ORDER — SODIUM CHLORIDE 0.9 % IV SOLN
1.0000 g | INTRAVENOUS | Status: DC
  Administered 2024-03-24 – 2024-03-25 (×2): 1 g via INTRAVENOUS
  Filled 2024-03-24 (×2): qty 10

## 2024-03-24 MED ORDER — PANTOPRAZOLE SODIUM 40 MG PO TBEC
40.0000 mg | DELAYED_RELEASE_TABLET | Freq: Every day | ORAL | Status: DC
  Administered 2024-03-24 – 2024-03-30 (×7): 40 mg via ORAL
  Filled 2024-03-24 (×8): qty 1

## 2024-03-24 MED ORDER — DIVALPROEX SODIUM 250 MG PO DR TAB
250.0000 mg | DELAYED_RELEASE_TABLET | Freq: Three times a day (TID) | ORAL | Status: DC
  Administered 2024-03-24 – 2024-03-30 (×20): 250 mg via ORAL
  Filled 2024-03-24 (×23): qty 1

## 2024-03-24 MED ORDER — LAMOTRIGINE 25 MG PO TABS
25.0000 mg | ORAL_TABLET | Freq: Two times a day (BID) | ORAL | Status: DC
  Administered 2024-03-24 – 2024-03-26 (×5): 25 mg via ORAL
  Filled 2024-03-24 (×5): qty 1

## 2024-03-24 MED ORDER — ONDANSETRON HCL 4 MG/2ML IJ SOLN: 4.0000 mg | Freq: Four times a day (QID) | INTRAMUSCULAR | Status: DC | PRN

## 2024-03-24 MED ORDER — CHLORHEXIDINE GLUCONATE CLOTH 2 % EX PADS
6.0000 | MEDICATED_PAD | Freq: Every day | CUTANEOUS | Status: DC
  Administered 2024-03-25 – 2024-03-29 (×4): 6 via TOPICAL

## 2024-03-24 MED ORDER — MAGNESIUM HYDROXIDE 400 MG/5ML PO SUSP
30.0000 mL | Freq: Every day | ORAL | Status: DC | PRN
  Administered 2024-03-27: 30 mL via ORAL
  Filled 2024-03-24: qty 30

## 2024-03-24 MED ORDER — ACETAMINOPHEN 325 MG RE SUPP
650.0000 mg | Freq: Once | RECTAL | Status: AC
  Administered 2024-03-24: 650 mg via RECTAL
  Filled 2024-03-24: qty 2

## 2024-03-24 MED ORDER — ACETAMINOPHEN 650 MG RE SUPP
650.0000 mg | Freq: Four times a day (QID) | RECTAL | Status: DC | PRN
  Administered 2024-03-29 – 2024-03-30 (×2): 650 mg via RECTAL
  Filled 2024-03-24 (×2): qty 1

## 2024-03-24 MED ORDER — DEXTROSE 5 % IV SOLN: Freq: Once | INTRAVENOUS | Status: AC

## 2024-03-24 MED ORDER — TRAZODONE HCL 50 MG PO TABS: 25.0000 mg | ORAL_TABLET | Freq: Every evening | ORAL | Status: DC | PRN

## 2024-03-24 MED ORDER — SODIUM CHLORIDE 0.9 % IV SOLN
2.0000 g | Freq: Once | INTRAVENOUS | Status: AC
  Administered 2024-03-24: 2 g via INTRAVENOUS
  Filled 2024-03-24: qty 20

## 2024-03-24 MED ORDER — ONDANSETRON HCL 4 MG PO TABS: 4.0000 mg | ORAL_TABLET | Freq: Four times a day (QID) | ORAL | Status: DC | PRN

## 2024-03-24 MED ORDER — POTASSIUM CHLORIDE 20 MEQ PO PACK: 40.0000 meq | PACK | Freq: Once | ORAL | Status: DC

## 2024-03-24 MED ORDER — HYDRALAZINE HCL 50 MG PO TABS
25.0000 mg | ORAL_TABLET | Freq: Three times a day (TID) | ORAL | Status: DC
  Administered 2024-03-24 – 2024-03-25 (×4): 25 mg via ORAL
  Filled 2024-03-24 (×4): qty 1

## 2024-03-24 MED ORDER — POTASSIUM CHLORIDE 10 MEQ/100ML IV SOLN
10.0000 meq | INTRAVENOUS | Status: AC
  Administered 2024-03-24 (×4): 10 meq via INTRAVENOUS
  Filled 2024-03-24 (×4): qty 100

## 2024-03-24 MED ORDER — SERTRALINE HCL 50 MG PO TABS
25.0000 mg | ORAL_TABLET | Freq: Every day | ORAL | Status: DC
  Administered 2024-03-24 – 2024-03-30 (×7): 25 mg via ORAL
  Filled 2024-03-24 (×8): qty 1

## 2024-03-24 MED ORDER — IRBESARTAN 75 MG PO TABS
75.0000 mg | ORAL_TABLET | Freq: Every day | ORAL | Status: DC
  Administered 2024-03-24 – 2024-03-25 (×2): 75 mg via ORAL
  Filled 2024-03-24 (×2): qty 1

## 2024-03-24 MED ORDER — LEVETIRACETAM 500 MG PO TABS
500.0000 mg | ORAL_TABLET | Freq: Two times a day (BID) | ORAL | Status: DC
  Administered 2024-03-24 – 2024-03-30 (×13): 500 mg via ORAL
  Filled 2024-03-24 (×13): qty 1

## 2024-03-24 MED ORDER — DEXTROSE 50 % IV SOLN: 1.0000 | Freq: Once | INTRAVENOUS | Status: AC

## 2024-03-24 MED ORDER — SENNA 8.6 MG PO TABS
1.0000 | ORAL_TABLET | Freq: Every day | ORAL | Status: DC
  Administered 2024-03-24 – 2024-03-29 (×6): 8.6 mg via ORAL
  Filled 2024-03-24 (×6): qty 1

## 2024-03-24 MED ORDER — ENOXAPARIN SODIUM 40 MG/0.4ML IJ SOSY
40.0000 mg | PREFILLED_SYRINGE | INTRAMUSCULAR | Status: DC
  Administered 2024-03-24 – 2024-03-30 (×7): 40 mg via SUBCUTANEOUS
  Filled 2024-03-24 (×7): qty 0.4

## 2024-03-24 MED ORDER — ACETAMINOPHEN 325 MG PO TABS: 650.0000 mg | ORAL_TABLET | Freq: Four times a day (QID) | ORAL | Status: DC | PRN

## 2024-03-24 NOTE — Assessment & Plan Note (Signed)
-   This is likely contributing to her acute metabolic encephalopathy. - She has been placed on D5W and fusion and was ordered an amp of D50.

## 2024-03-24 NOTE — Assessment & Plan Note (Signed)
-   Will continue antihypertensive therapy.

## 2024-03-24 NOTE — Assessment & Plan Note (Signed)
-   This is likely secondary to #1 and #2 - Management as above. - Will continue to monitor mental status.

## 2024-03-24 NOTE — Assessment & Plan Note (Signed)
-   Will continue Keppra  and Vimpat  and Lamictal .

## 2024-03-24 NOTE — Assessment & Plan Note (Signed)
"  Continue Zoloft   "

## 2024-03-24 NOTE — Assessment & Plan Note (Signed)
-   Potassium will be replaced and magnesium level will be checked. ?

## 2024-03-24 NOTE — H&P (Signed)
 "     [place]   PATIENT NAME: Alicia Brennan    MR#:  [id number]  DATE OF BIRTH:  01/29/1951  DATE OF ADMISSION:  03/23/2024  PRIMARY CARE PHYSICIAN: [name], Edra GRADE, MD   Patient is coming from: [facility]  REQUESTING/REFERRING PHYSICIAN: Clarine Sharper, MD   CHIEF COMPLAINT:   Chief Complaint  Patient presents with   Altered Mental Status    As per EMS the patient altered    HISTORY OF PRESENT ILLNESS:  Alicia Brennan is a 73 y.o. African-American female with medical history significant for osteoarthritis, essential hypertension, IBS, continue lymphedema, sarcoidosis, seizures and vascular dementia, who presented to the emergency room with acute onset of altered mental status.  She has been looking at her daughter and SNF staff but has not been answering questions or following commands.  It is unclear when the last time she was at her normal baseline.  No reported fever or chills at her SNF.  No reported nausea or vomiting or abdominal pain or diarrhea.  No reported chest pain or palpitations.  No reported cough or wheezing or dyspnea.  She is a very poor historian due to dementia  ED Course: When she came to the ER, BP was 169/73, temperature 99.6 and later one 1.4 with otherwise normal vital signs.  Rate was later 98.  Labs revealed hypokalemia of 3.1 and CO2 19 with a blood glucose of 63 albumin  2.9 calcium 8.1 and total protein 6.5.  Lactic acid was 1.  CBC was normal.  Respiratory panel came back negative. EKG as reviewed by me : None. Imaging: Portable chest x-ray showed mild cardiomegaly without evidence of CHF.  It showed no acute infiltrates.  Noncontrasted head CT scan revealed no acute intracranial normalities.  It showed moderate global parenchymal volume loss, slightly advanced for age with mild periventricular white matter changes likely due to small vessel ischemia, mild ventriculomegaly that is unchanged.  The patient was given 2 g of IV  Rocephin , 650 mg p.r. Tylenol , and 1 L bolus of IV normal saline.  She will be admitted to a medical telemetry bed for further evaluation and management. 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   Foley catheter in place    Hypertension    IBS (irritable bowel syndrome)    Leg fracture, left    Lymphedema    Mood disturbance    Recurrent headache    Sarcoidosis of lung    Seizures (HCC)    Thrombocytopenia    Vascular dementia (HCC) 01/31/2024   Wound of buttock    Right, pressure wound    PAST SURGICAL HISTORY:   Past Surgical History:  Procedure Laterality Date   BILATERAL ANTERIOR TOTAL HIP ARTHROPLASTY Bilateral 11/15/2015   Procedure: BILATERAL ANTERIOR TOTAL HIP ARTHROPLASTY;  Surgeon: Redell Shoals, MD;  Location: [facility] OR;  Service: Orthopedics;  Laterality: Bilateral;   CARPAL TUNNEL RELEASE Bilateral    CHOLECYSTECTOMY     COLONOSCOPY     CYSTOSCOPY W/ URETERAL STENT PLACEMENT Left 01/03/2024   Procedure: CYSTOSCOPY, WITH RETROGRADE PYELOGRAM AND URETERAL STENT INSERTION;  Surgeon: Devere Lonni Righter, MD;  Location: [facility];  Service: Urology;  Laterality: Left;   CYSTOSCOPY/URETEROSCOPY/HOLMIUM LASER/STENT PLACEMENT Left 02/13/2024   Procedure: CYSTOSCOPY/URETEROSCOPY/HOLMIUM LASER/STENT PLACEMENT;  Surgeon: Devere Lonni Righter, MD;  Location: [facility];  Service: Urology;  Laterality: Left;  CYSTOSCOPY/LEFT URETEROSCOPY/HOLMIUM LASER/STENT PLACEMENT  EYE SURGERY     r/t sarcoidisis   ORIF ELBOW FRACTURE Left    TIBIA FRACTURE SURGERY Left     SOCIAL HISTORY:   Social History   Tobacco Use   Smoking status: Never   Smokeless tobacco: Never  Substance Use Topics   Alcohol  use: No    Alcohol /week: 0.0 standard drinks of alcohol     FAMILY HISTORY:   Family History  Problem Relation Age of Onset   Dementia Mother    High blood pressure Father    Dementia Maternal Aunt    Sarcoidosis  Son    Seizures Neg Hx     DRUG ALLERGIES:  Allergies[1]  REVIEW OF SYSTEMS:   ROS As per history of present illness unobtainable due to altered mental status.  MEDICATIONS AT HOME:   Prior to Admission medications  Medication Sig Start Date End Date Taking? Authorizing Provider  divalproex  (DEPAKOTE ) 250 MG DR tablet Take 1 tablet (250 mg total) by mouth 3 (three) times daily. 03/25/18   Harris, Abigail, PA-C  feeding supplement (ENSURE PLUS HIGH PROTEIN) LIQD Take 237 mLs by mouth 2 (two) times daily between meals. 01/10/24   Lue Elsie BROCKS, MD  furosemide  (LASIX ) 40 MG tablet Take 1 tablet (40 mg total) by mouth daily as needed. 02/05/24   Laurence Locus, DO  hydrALAZINE  (APRESOLINE ) 25 MG tablet Take 1 tablet (25 mg total) by mouth every 8 (eight) hours. 12/11/23   Odell Celinda Balo, MD  lacosamide  (VIMPAT ) 50 MG TABS tablet Take 1 tablet (50 mg total) by mouth 2 (two) times daily. 01/10/24   Lue Elsie BROCKS, MD  lactulose  (CHRONULAC ) 10 GM/15ML solution Take 15 mLs (10 g total) by mouth daily. 12/11/23   Odell Celinda Balo, MD  lamoTRIgine  (LAMICTAL ) 25 MG tablet Take 25 mg by mouth 2 (two) times daily. 11/19/23 04/05/24  [provider]  levETIRAcetam  (KEPPRA ) 500 MG tablet Take 1 tablet (500 mg total) by mouth 2 (two) times daily. 12/11/23   Odell Celinda Balo, MD  [Paused] NAYZILAM  5 MG/0.1ML SOLN GIVE ONE SPRAY INTO 1 NOSTRIL. IF NO RESPONSE IN 10 MINUTES, MAY GIVE SECOND SPRAY IN OTHER NOSTRIL. DO NOT GIVE SECOND DOSE IF PATIENT HAS TROUBLE BREATHING OR EXCESSIVE SEDATION, MAX 2 DOSES/SEIZURE EPISODE, 1 EPISODE EVERY 3 DAYS OR 5 EPISODES/MONTH Nasal for 6 Days Wait to take this until your doctor or other care provider tells you to start again. 09/24/23   [provider]  pantoprazole  (PROTONIX ) 40 MG tablet Take 1 tablet (40 mg total) by mouth daily. 02/08/24 02/07/25  Regalado, Belkys A, MD  senna (SENOKOT) 8.6 MG tablet Take 1 tablet by mouth at  bedtime.    [provider]  sertraline  (ZOLOFT ) 25 MG tablet Take 25 mg by mouth daily.    [provider]  telmisartan (MICARDIS) 80 MG tablet Take 80 mg by mouth daily. 12/04/22   [provider]      VITAL SIGNS:  Blood pressure (!) 169/73, pulse 88, temperature (!) 101.4 F (38.6 C), temperature source Rectal, resp. rate 19.  PHYSICAL EXAMINATION:  Physical Exam  GENERAL:  73 y.o.-year-old African-American female patient lying in the bed with no acute distress.  EYES: Pupils equal, round, reactive to light and accommodation. No scleral icterus. Extraocular muscles intact.  HEENT: Head atraumatic, normocephalic. Oropharynx and nasopharynx clear.  NECK:  Supple, no jugular venous distention. No thyroid enlargement, no tenderness.  LUNGS: Normal breath sounds bilaterally, no wheezing, rales,rhonchi or crepitation. No use  of accessory muscles of respiration.  CARDIOVASCULAR: Regular rate and rhythm, S1, S2 normal. No murmurs, rubs, or gallops.  ABDOMEN: Soft, nondistended, nontender. Bowel sounds present. No organomegaly or mass.  EXTREMITIES: No pedal edema, cyanosis, or clubbing.  NEUROLOGIC: Cranial nerves II through XII are intact. Muscle strength 5/5 in all extremities. Sensation intact. Gait not checked.  PSYCHIATRIC: The patient is alert and oriented x 3.  Normal affect and good eye contact. SKIN: No obvious rash, lesion, or ulcer.   LABORATORY PANEL:   CBC Recent Labs  Lab 03/23/24 2116  WBC 6.1  HGB 13.6  HCT 43.1  PLT 217   ------------------------------------------------------------------------------------------------------------------  Chemistries  Recent Labs  Lab 03/23/24 2245  NA 142  K 3.1*  CL 110  CO2 19*  GLUCOSE 63*  BUN 10  CREATININE 0.65  CALCIUM 8.1*  AST 32  ALT 11  ALKPHOS 69  BILITOT 0.8    ------------------------------------------------------------------------------------------------------------------  Cardiac Enzymes No results for input(s): TROPONINI in the last 168 hours. ------------------------------------------------------------------------------------------------------------------  RADIOLOGY:  DG Chest Portable 1 View Result Date: 03/24/2024 EXAM: 1 VIEW(S) XRAY OF THE CHEST 03/24/2024 02:34:00 AM COMPARISON: Portable chest 01/31/2024. CLINICAL HISTORY: fever, unclear source -- eval for pneumonia FINDINGS: LUNGS AND PLEURA: No focal pulmonary opacity. No pleural effusion. No pneumothorax. HEART AND MEDIASTINUM: Stable mediastinum with aortic tortuosity and atherosclerosis. Mild cardiomegaly. No evidence of CHF. BONES AND SOFT TISSUES: Osteopenia with thoracic spondylosis. IMPRESSION: 1. No acute infiltrate is seen. 2. Mild cardiomegaly without evidence of CHF. Electronically signed by: Francis Quam MD 03/24/2024 02:57 AM EST RP Workstation: [id number]   CT HEAD WO CONTRAST ( ) Result Date: 03/23/2024 EXAM: CT HEAD WITHOUT CONTRAST 03/23/2024 09:04:55 PM TECHNIQUE: CT of the head was performed without the administration of intravenous contrast. Automated exposure control, iterative reconstruction, and/or weight based adjustment of the mA/kV was utilized to reduce the radiation dose to as low as reasonably achievable. COMPARISON: 01/02/2024 CLINICAL HISTORY: Mental status change, unknown cause FINDINGS: BRAIN AND VENTRICLES: No acute hemorrhage. No evidence of acute infarct. Moderate global parenchymal volume loss slightly advanced in relation to the patient's stated age. Mild periventricular white matter changes are present likely reflective of small vessel ischemia. Mild ventriculomegaly is present, commensurate with a degree of parenchymal atrophy and likely representing ex vacuo dilation. This appears unchanged from prior examination. No extra-axial collection. No mass  effect or midline shift. ORBITS: No acute abnormality. SINUSES: No acute abnormality. SOFT TISSUES AND SKULL: No acute soft tissue abnormality. No skull fracture. IMPRESSION: 1. No acute intracranial abnormality. 2. Moderate global parenchymal volume loss, slightly advanced for age, with mild periventricular white matter changes likely due to small vessel ischemia. 3. Mild ventriculomegaly, likely ex vacuo, unchanged. Electronically signed by: Dorethia Molt MD 03/23/2024 09:54 PM EST RP Workstation: [id number]      IMPRESSION AND PLAN:  Assessment and Plan: * Sepsis due to gram-negative UTI (HCC) - This is manifested by fever and tachycardia. - She will be admitted to a telemetry bed. - Will continue antibiotic therapy with IV Rocephin . - Will follow blood and urine cultures.  Hypoglycemia - This is likely contributing to her acute metabolic encephalopathy. - She has been placed on D5W and fusion and was ordered an amp of D50.  Acute metabolic encephalopathy - This is likely secondary to #1 and #2 - Management as above. - Will continue to monitor mental status.  Hypokalemia - Potassium will be replaced and magnesium level will be checked  Essential hypertension - Will continue antihypertensive  therapy.  Seizure disorder (HCC) - Will continue Keppra  and Vimpat  and Lamictal .  Depression - Continue Zoloft .  GERD without esophagitis - Continue PPI therapy.   DVT prophylaxis: Lovenox . Advanced Care Planning:  Code Status: full code. Family Communication:  The plan of care was discussed in details with the patient (and family). I answered all questions. The patient agreed to proceed with the above mentioned plan. Further management will depend upon hospital course. Disposition Plan: Back to previous home environment Consults called: none. All the records are reviewed and case discussed with ED provider.  Status is: Inpatient  At the time of the admission, it appears that the  appropriate admission status for this patient is inpatient.  This is judged to be reasonable and necessary in order to provide the required intensity of service to ensure the patient's safety given the presenting symptoms, physical exam findings and initial radiographic and laboratory data in the context of comorbid conditions.  The patient requires inpatient status due to high intensity of service, high risk of further deterioration and high frequency of surveillance required.  I certify that at the time of admission, it is my clinical judgment that the patient will require inpatient hospital care extending more than 2 midnights.                            Dispo: The patient is from: Home              Anticipated d/c is to: Home              Patient currently is not medically stable to d/c.              Difficult to place patient: No  Madison DELENA Peaches M.D on 03/24/2024 at 4:35 AM  [facility]   From 7 PM-7 AM, contact night-coverage [email or web address]  CC: Primary care physician; [name], Edra GRADE, MD     [1] No Known Allergies  "

## 2024-03-24 NOTE — Assessment & Plan Note (Signed)
 Continue PPI therapy.

## 2024-03-24 NOTE — ED Provider Notes (Signed)
" °  Physical Exam  BP (!) 144/66 (BP Location: Right Arm)   Pulse 73   Temp 98.4 F (36.9 C) (Oral)   Resp 17   Ht 5' 4 (1.626 m)   Wt 69.1 kg   SpO2 94%   BMI 26.15 kg/m   Physical Exam  Procedures  .Critical Care  Performed by: Clarine Ozell LABOR, MD Authorized by: Clarine Ozell LABOR, MD   Critical care provider statement:    Critical care time (minutes):  30   Critical care time was exclusive of:  Separately billable procedures and treating other patients   Critical care was necessary to treat or prevent imminent or life-threatening deterioration of the following conditions:  Sepsis   Critical care was time spent personally by me on the following activities:  Development of treatment plan with patient or surrogate, discussions with consultants, evaluation of patient's response to treatment, examination of patient, ordering and review of laboratory studies, ordering and review of radiographic studies, ordering and performing treatments and interventions, pulse oximetry, re-evaluation of patient's condition and review of old charts   Care discussed with: admitting provider     ED Course / MDM   Clinical Course as of 03/24/24 0805  Sun Mar 23, 2024  2302 S/o from Dr. Dorothyann: - 38F AMS, nonverbal, not following commands - unclear baseline - cbc wnl, resp panel wnl, CTH neg  TO DO: - f/u cmp, UA - collateral [MM]  Mon Mar 24, 2024  0143 Discussed with nursing regarding straight cath, but will perform now.  On their reassessment, patient has developed a fever, axillary temperature 100.4 axillary, 101.4 rectal.  I reevaluated patient, does feel quite warm.  Remains altered from her current baseline in which she is normally conversive.  Is awake and alert, protecting airway without difficulty.  Does attempt to speak a few times.  Will perform urine catheterization now and also add chest x-ray.  Adding blood cultures and lactate given fever, this with some tachycardia on re-eval is  concerning for sepsis, not meeting septic shock criteria.  Viral swab negative.  No leukocytosis. [MM]  0229 WBC, UA: >50 [MM]  [address]): MODERATE [MM]  0229 Bacteria, UA(!): MANY [MM]  0229 UA grossly consistent with infection, will treat [MM]  0229 Hospitalist consult order placed [MM]  0231 Patient also with some persistent mild hypoglycemia, not safely tolerating p.o., will start on D5 infusion [MM]    Clinical Course User Index [MM] Clarine Ozell LABOR, MD   Medical Decision Making Amount and/or Complexity of Data Reviewed Labs: ordered. Decision-making details documented in ED Course. Radiology: ordered.  Risk OTC drugs. Prescription drug management. Decision regarding hospitalization.          Clarine Ozell LABOR, MD 03/24/24 [phone]  "

## 2024-03-24 NOTE — Assessment & Plan Note (Signed)
-   This is manifested by fever and tachycardia. - She will be admitted to a telemetry bed. - Will continue antibiotic therapy with IV Rocephin . - Will follow blood and urine cultures.

## 2024-03-25 DIAGNOSIS — A4150 Gram-negative sepsis, unspecified: Secondary | ICD-10-CM | POA: Diagnosis not present

## 2024-03-25 DIAGNOSIS — N390 Urinary tract infection, site not specified: Secondary | ICD-10-CM | POA: Diagnosis not present

## 2024-03-25 LAB — URINE CULTURE

## 2024-03-25 LAB — GLUCOSE, CAPILLARY
Glucose-Capillary: 114 mg/dL — ABNORMAL HIGH (ref 70–99)
Glucose-Capillary: 121 mg/dL — ABNORMAL HIGH (ref 70–99)
Glucose-Capillary: 103 mg/dL — ABNORMAL HIGH (ref 70–99)
Glucose-Capillary: 137 mg/dL — ABNORMAL HIGH (ref 70–99)
Glucose-Capillary: 106 mg/dL — ABNORMAL HIGH (ref 70–99)
Glucose-Capillary: 114 mg/dL — ABNORMAL HIGH (ref 70–99)

## 2024-03-25 LAB — MRSA NEXT GEN BY PCR, NASAL: MRSA by PCR Next Gen: NOT DETECTED

## 2024-03-25 MED ORDER — SODIUM CHLORIDE 0.9 % IV BOLUS
250.0000 mL | Freq: Once | INTRAVENOUS | Status: AC
  Administered 2024-03-25: 250 mL via INTRAVENOUS

## 2024-03-25 NOTE — Progress Notes (Signed)
" °  PROGRESS NOTE    Alicia Brennan  [name] DOB: October 09, 1950 DOA: 03/23/2024 PCP: [name], MD  113A/113A-AA  LOS: 1 day   Brief hospital course:   Assessment & Plan: Alicia Brennan is a 73 y.o. African-American female with medical history significant for osteoarthritis, essential hypertension, IBS, continue lymphedema, sarcoidosis, seizures and vascular dementia, who presented to the emergency room with acute onset of altered mental status.  She has been looking at her daughter and SNF staff but has not been answering questions or following commands.  It is unclear when the last time she was at her normal baseline.    SIRS - This is manifested by fever and tachycardia.  Bacteruria  --urine cx grew multiple species, likely chronic colonization.  Hypoglycemia, resolved - This is likely contributing to her acute metabolic encephalopathy.  Acute metabolic encephalopathy Underlying dementia  Hypokalemia --monitor and supplement PRN  Essential hypertension --d/c hydralazine  and irbesartan  today due to hypotension  Seizure disorder (HCC) - Will continue Keppra  and Vimpat  and Lamictal  and Depakote   Depression - Continue Zoloft .  GERD without esophagitis - Continue PPI therapy.   DVT prophylaxis: Lovenox  SQ Code Status: Full code  Family Communication:  Level of care: Telemetry Dispo:   The patient is from: SNF Anticipated d/c is to: SNF Anticipated d/c date is: 1-2 days   Subjective and Interval History:  RN reported pt not having much oral intake.     Objective: Vitals:   03/25/24 0452 03/25/24 0751 03/25/24 1605 03/25/24 1823  BP: 121/64 123/73 (!) 87/45 131/68  Pulse: 85 78 71 72  Resp: 16 16 16    Temp: 98.8 F (37.1 C) 98.6 F (37 C) (!) 97.5 F (36.4 C)   TempSrc:  Oral    SpO2: 94% 97% 97%   Weight:      Height:       No intake or output data in the 24 hours ending 03/25/24 2133 Filed Weights   03/24/24 0446  Weight: 69.1 kg     Examination:   Constitutional: NAD CV: No cyanosis.   RESP: normal respiratory effort, on RA SKIN: warm, dry   Data Reviewed: I have personally reviewed labs and imaging studies  Time spent: 50 minutes  Ellouise Haber, MD [facility] If 7PM-7AM, please contact night-coverage 03/25/2024, 9:33 PM   "

## 2024-03-25 NOTE — Plan of Care (Signed)

## 2024-03-26 ENCOUNTER — Inpatient Hospital Stay

## 2024-03-26 ENCOUNTER — Encounter: Payer: Self-pay | Admitting: Family Medicine

## 2024-03-26 DIAGNOSIS — A4150 Gram-negative sepsis, unspecified: Secondary | ICD-10-CM | POA: Diagnosis not present

## 2024-03-26 DIAGNOSIS — N390 Urinary tract infection, site not specified: Secondary | ICD-10-CM | POA: Diagnosis not present

## 2024-03-26 NOTE — Progress Notes (Signed)
" °  PROGRESS NOTE    Alicia Brennan  [name] DOB: [birth year] DOA: 03/23/2024 PCP: [name], MD  113A/113A-AA  LOS: 2 days   Brief hospital course:   Assessment & Plan: Alicia Brennan is a 73 y.o. African-American female with medical history significant for osteoarthritis, essential hypertension, IBS, continue lymphedema, sarcoidosis, seizures and vascular dementia, who presented to the emergency room with acute onset of altered mental status.  She has been looking at her daughter and SNF staff but has not been answering questions or following commands.  It is unclear when the last time she was at her normal baseline.    SIRS - This is manifested by fever and tachycardia.  Bacteruria  --urine cx grew multiple species, likely chronic colonization.  Completed 3 days of empiric ceftriaxone .  Hypoglycemia, resolved - This is likely contributing to her acute metabolic encephalopathy. --encourage oral intake  Acute metabolic encephalopathy Underlying dementia  Hypokalemia --monitor and supplement PRN  Essential hypertension --hold hydralazine  and irbesartan  due to hypotension  Seizure disorder (HCC) - continue Keppra , Vimpat  and Depakote  --d/c Lamictal , since no mention of this in most recent neuro note.  Depression - Continue Zoloft .  GERD without esophagitis - Continue PPI therapy.   DVT prophylaxis: Lovenox  SQ Code Status: Full code  Family Communication:  Level of care: Telemetry Dispo:   The patient is from: SNF rehab Anticipated d/c is to: SNF rehab Anticipated d/c date is: medical ready tomorrow   Subjective and Interval History:  RN noted pain and weakness of pt's right arm.    Objective: Vitals:   03/26/24 0425 03/26/24 0821 03/26/24 1853 03/26/24 1949  BP: (!) 129/55 (!) 101/52 (!) 142/65 123/73  Pulse: 61 62 70 72  Resp: 18 16 17 17   Temp: 98.1 F (36.7 C) 97.8 F (36.6 C) 98.6 F (37 C) 97.7 F (36.5 C)  TempSrc: Oral   Oral   SpO2: 93% 97% 100% 96%  Weight:      Height:        Intake/Output Summary (Last 24 hours) at 03/26/2024 2145 Last data filed at 03/26/2024 1900 Gross per 24 hour  Intake 1080 ml  Output --  Net 1080 ml   Filed Weights   03/24/24 0446  Weight: 69.1 kg    Examination:   Constitutional: NAD, awake, not oriented, could answer simple questions today HEENT: conjunctivae and lids normal, EOMI CV: No cyanosis.   RESP: normal respiratory effort, on RA Extremities: edema in right forearm SKIN: warm, dry   Data Reviewed: I have personally reviewed labs and imaging studies  Time spent: 35 minutes  Ellouise Haber, MD [facility] If 7PM-7AM, please contact night-coverage 03/26/2024, 9:45 PM   "

## 2024-03-26 NOTE — Plan of Care (Signed)

## 2024-03-26 NOTE — [place] FL2 (Signed)
 " [place]  MEDICAID FL2 LEVEL OF CARE FORM     IDENTIFICATION  Patient Name: Alicia Brennan Birthdate: 01-Feb-1951 Sex: female Admission Date (Current Location): 03/23/2024  [facility] and [place] Number:  Chiropodist and Address:  [facility], [address], [place], [place] [address]      Provider Number: [id number]  Attending Physician Name and Address:  Awanda City, MD  Relative Name and Phone Number:       Current Level of Care: Hospital Recommended Level of Care: Skilled Nursing Facility Prior Approval Number:    Date Approved/Denied:   PASRR Number:    Discharge Plan: SNF    Current Diagnoses: Patient Active Problem List   Diagnosis Date Noted   Sepsis due to gram-negative UTI (HCC) 03/24/2024   Hypoglycemia 03/24/2024   Seizure disorder (HCC) 03/24/2024   Essential hypertension 03/24/2024   GERD without esophagitis 03/24/2024   Depression 03/24/2024   Acute metabolic encephalopathy 03/24/2024   Hypokalemia 03/24/2024   Debility 02/05/2024   Obesity, Class I, BMI 30-34.9 02/05/2024   UTI (urinary tract infection) due to urinary indwelling Foley catheter 01/31/2024   Vascular dementia (HCC) 01/31/2024   Osteoarthritis of both hips 11/15/2015   Avascular necrosis of bones of both hips (HCC) 11/15/2015   Solitary pulmonary nodule 05/03/2015   Localization-related (focal) (partial) idiopathic epilepsy and epileptic syndromes with seizures of localized onset, not intractable, without status epilepticus (HCC) 04/25/2015   Cancer (HCC)    Sarcoidosis of lung    Hypertension    Recurrent headache     Orientation RESPIRATION BLADDER Height & Weight     Self, Place  Normal Incontinent Weight: 69.1 kg Height:  5' 4 (162.6 cm)  BEHAVIORAL SYMPTOMS/MOOD NEUROLOGICAL BOWEL NUTRITION STATUS      Incontinent Diet (Heart healthy)  AMBULATORY STATUS COMMUNICATION OF NEEDS Skin   Extensive Assist Verbally PU Stage and Appropriate Care                        Personal Care Assistance Level of Assistance  Bathing, Feeding, Dressing Bathing Assistance: Maximum assistance Feeding assistance: Limited assistance Dressing Assistance: Maximum assistance     Functional Limitations Info             SPECIAL CARE FACTORS FREQUENCY                       Contractures Contractures Info: Not present    Additional Factors Info  Code Status, Allergies, Isolation Precautions Code Status Info: Full Allergies Info: NKA     Isolation Precautions Info: MRSA     Current Medications (03/26/2024):  This is the current hospital active medication list Current Facility-Administered Medications  Medication Dose Route Frequency Provider Last Rate Last Admin   acetaminophen  (TYLENOL ) tablet 650 mg  650 mg Oral Q6H PRN Mansy, Jan A, MD       Or   acetaminophen  (TYLENOL ) suppository 650 mg  650 mg Rectal Q6H PRN Mansy, Madison LABOR, MD       Chlorhexidine  Gluconate Cloth 2 % PADS 6 each  6 each Topical Q0600 Awanda City, MD   6 each at 03/26/24 0536   dextrose  50 % solution 50 mL  1 ampule Intravenous Once Mansy, Jan A, MD       divalproex  (DEPAKOTE ) DR tablet 250 mg  250 mg Oral TID Mansy, Jan A, MD   250 mg at 03/26/24 1559   enoxaparin  (LOVENOX ) injection 40 mg  40 mg Subcutaneous Q24H Mansy, Jan A, MD   40 mg at 03/26/24 9096   feeding supplement (ENSURE PLUS HIGH PROTEIN) liquid 237 mL  237 mL Oral BID BM Mansy, Jan A, MD   237 mL at 03/26/24 1559   lacosamide  (VIMPAT ) tablet 50 mg  50 mg Oral BID Mansy, Jan A, MD   50 mg at 03/26/24 9096   lactulose  (CHRONULAC ) 10 GM/15ML solution 10 g  10 g Oral Daily Mansy, Jan A, MD   10 g at 03/26/24 9097   levETIRAcetam  (KEPPRA ) tablet 500 mg  500 mg Oral BID Mansy, Jan A, MD   500 mg at 03/26/24 0903   magnesium hydroxide (MILK OF MAGNESIA) suspension 30 mL  30 mL Oral Daily PRN Mansy, Jan A, MD       ondansetron  (ZOFRAN ) tablet 4 mg  4 mg Oral Q6H PRN Mansy, Jan A, MD       Or    ondansetron  (ZOFRAN ) injection 4 mg  4 mg Intravenous Q6H PRN Mansy, Jan A, MD       pantoprazole  (PROTONIX ) EC tablet 40 mg  40 mg Oral Daily Mansy, Jan A, MD   40 mg at 03/26/24 0904   senna (SENOKOT) tablet 8.6 mg  1 tablet Oral QHS Mansy, Jan A, MD   8.6 mg at 03/25/24 2241   sertraline  (ZOLOFT ) tablet 25 mg  25 mg Oral Daily Mansy, Jan A, MD   25 mg at 03/26/24 0903   traZODone (DESYREL) tablet 25 mg  25 mg Oral QHS PRN Mansy, Madison LABOR, MD         Discharge Medications: Please see discharge summary for a list of discharge medications.  Relevant Imaging Results:  Relevant Lab Results:   Additional Information SSN   [id number]  Nathanael CHRISTELLA Ring, RN     "

## 2024-03-26 NOTE — TOC Initial Note (Signed)
 Transition of Care [facility]) - Initial/Assessment Note    Patient Details  Name: Alicia Brennan MRN: [id number] Date of Birth: Mar 27, 1951  [facility] CM/SW Contact:    Nathanael CHRISTELLA Ring, RN Phone Number: 03/26/2024, 4:37 PM  Clinical Narrative:                 CM met with patient and son at the bedside.  Patient is drinking Ensure, she finished a whole bottle and nurse opened another and she started drinking that, introduced self and explained role in DC planning.  Son reports that patient has been at [facility] for a couple of months he says they are working on LTC but he would really like to take her home if he can get in home care with Medicaid, he knows to go through her PCP for the [facility] paperwork. Plan for discharge back to [facility]. Asked MD to order PT/OT evals and will start auth once evals are in.   Expected Discharge Plan: Skilled Nursing Facility Barriers to Discharge: Continued Medical Work up   Patient Goals and CMS Choice Patient states their goals for this hospitalization and ongoing recovery are:: Son would like to take patient home but for now she cannot walk and he can't take care of her CMS Medicare.gov Compare Post Acute Care list provided to:: Patient Represenative (must comment) Choice offered to / list presented to : Adult Children      Expected Discharge Plan and Services   Discharge Planning Services: CM Consult Post Acute Care Choice: Skilled Nursing Facility Living arrangements for the past 2 months: Skilled Nursing Facility                 DME Arranged: N/A         HH Arranged: NA          Prior Living Arrangements/Services Living arrangements for the past 2 months: Skilled Nursing Facility Lives with:: Facility Resident Patient language and need for interpreter reviewed:: Yes Do you feel safe going back to the place where you live?: Yes      Need for Family Participation in Patient Care: Yes (Comment) Care  giver support system in place?: Yes (comment)   Criminal Activity/Legal Involvement Pertinent to Current Situation/Hospitalization: No - Comment as needed  Activities of Daily Living   ADL Screening (condition at time of admission) Independently performs ADLs?: No Does the patient have a NEW difficulty with bathing/dressing/toileting/self-feeding that is expected to last >3 days?: Yes (Initiates electronic notice to provider for possible OT consult) Does the patient have a NEW difficulty with getting in/out of bed, walking, or climbing stairs that is expected to last >3 days?: Yes (Initiates electronic notice to provider for possible PT consult) Does the patient have a NEW difficulty with communication that is expected to last >3 days?: No Is the patient deaf or have difficulty hearing?: No Does the patient have difficulty seeing, even when wearing glasses/contacts?: No Does the patient have difficulty concentrating, remembering, or making decisions?: Yes  Permission Sought/Granted Permission sought to share information with : Facility Medical Sales Representative, Family Supports Permission granted to share information with : Yes, Verbal Permission Granted     Permission granted to share info w AGENCY: Neurosurgeon granted to share info w Relationship: son     Emotional Assessment Appearance:: Appears stated age Attitude/Demeanor/Rapport: Engaged Affect (typically observed): Accepting Orientation: : Oriented to Self Alcohol  / Substance Use: Not Applicable Psych Involvement: No (comment)  Admission diagnosis:  Hypoglycemia [E16.2] Fever, unspecified fever cause [R50.9] Urinary tract infection with hematuria, site unspecified [N39.0, R31.9] Altered mental status, unspecified altered mental status type [R41.82] Sepsis due to gram-negative UTI (HCC) [A41.50, N39.0] Patient Active Problem List   Diagnosis Date Noted   Sepsis due to gram-negative UTI (HCC) 03/24/2024    Hypoglycemia 03/24/2024   Seizure disorder (HCC) 03/24/2024   Essential hypertension 03/24/2024   GERD without esophagitis 03/24/2024   Depression 03/24/2024   Acute metabolic encephalopathy 03/24/2024   Hypokalemia 03/24/2024   Debility 02/05/2024   Obesity, Class I, BMI 30-34.9 02/05/2024   UTI (urinary tract infection) due to urinary indwelling Foley catheter 01/31/2024   Vascular dementia (HCC) 01/31/2024   Osteoarthritis of both hips 11/15/2015   Avascular necrosis of bones of both hips (HCC) 11/15/2015   Solitary pulmonary nodule 05/03/2015   Localization-related (focal) (partial) idiopathic epilepsy and epileptic syndromes with seizures of localized onset, not intractable, without status epilepticus (HCC) 04/25/2015   Cancer (HCC)    Sarcoidosis of lung    Hypertension    Recurrent headache    PCP:  [name], Edra GRADE, MD Pharmacy:  No Pharmacies Listed    Social Drivers of Health (SDOH) Social History: SDOH Screenings   Food Insecurity: Food Insecurity Present (01/31/2024)  Housing: Low Risk (01/31/2024)  Transportation Needs: No Transportation Needs (01/31/2024)  Utilities: Not At Risk (01/31/2024)  Financial Resource Strain: Low Risk (11/16/2023)   Received from [facility]  Social Connections: Moderately Isolated (01/31/2024)  Tobacco Use: Low Risk (03/26/2024)   SDOH Interventions:     Readmission Risk Interventions     No data to display

## 2024-03-27 ENCOUNTER — Inpatient Hospital Stay

## 2024-03-27 DIAGNOSIS — N390 Urinary tract infection, site not specified: Secondary | ICD-10-CM | POA: Diagnosis not present

## 2024-03-27 DIAGNOSIS — A4150 Gram-negative sepsis, unspecified: Secondary | ICD-10-CM | POA: Diagnosis not present

## 2024-03-27 LAB — BASIC METABOLIC PANEL WITH GFR
Anion gap: 9 (ref 5–15)
BUN: 12 mg/dL (ref 8–23)
CO2: 24 mmol/L (ref 22–32)
Calcium: 8.8 mg/dL — ABNORMAL LOW (ref 8.9–10.3)
Chloride: 102 mmol/L (ref 98–111)
Creatinine, Ser: 0.6 mg/dL (ref 0.44–1.00)
GFR, Estimated: >60 mL/min
Glucose, Bld: 107 mg/dL — ABNORMAL HIGH (ref 70–99)
Potassium: 3.5 mmol/L (ref 3.5–5.1)
Sodium: 136 mmol/L (ref 135–145)

## 2024-03-27 LAB — CBC
HCT: 33.4 % — ABNORMAL LOW (ref 36.0–46.0)
Hemoglobin: 10.7 g/dL — ABNORMAL LOW (ref 12.0–15.0)
MCH: 30 pg (ref 26.0–34.0)
MCHC: 32 g/dL (ref 30.0–36.0)
MCV: 93.6 fL (ref 80.0–100.0)
Platelets: 181 K/uL (ref 150–400)
RBC: 3.57 MIL/uL — ABNORMAL LOW (ref 3.87–5.11)
RDW: 13.2 % (ref 11.5–15.5)
WBC: 4.6 K/uL (ref 4.0–10.5)
nRBC: 0 % (ref 0.0–0.2)

## 2024-03-27 LAB — MAGNESIUM: Magnesium: 1.9 mg/dL (ref 1.7–2.4)

## 2024-03-27 NOTE — Evaluation (Signed)
 Occupational Therapy Evaluation Patient Details Name: Alicia Brennan MRN: [id number] DOB: 02/26/1951 Today's Date: 03/27/2024   History of Present Illness   Pt is a 74 year old female presented to the emergency room with acute onset of altered mental status, MD work up includes SIRS, bacteruria, hypoglycemia, hypokalemia     PMH significant for osteoarthritis, essential hypertension, IBS, continue lymphedema, sarcoidosis, seizures and vascular dementi     Clinical Impressions Chart reviewed to date, pt greeted semi supine in bed, oriented to self only. Increased time and multi modal cues required for one step directions. Pt is a poor historian, per chart is from SNF. Per therapy notes from 08/2023 she was MODI-I in ADL/IADL, amb with SPC at that time but per additional notes during admission in 01/2024 used Stedy to stand with +2 assist. Pt presents with deficits in strength, endurance, activity tolerance, balance, cognition, affecting safe and optimal ADL completion. She also presents with generalized weakness although RUE>LUE and ?tone noted in the elbow. Nurse/MD notified. MAX-TOTAL A +2 required for bed mobility with MAX A for static sitting on edge of bed. MAX A required for ADLs at this time. Pt is left as received, all needs met. OT will follow acutely to facilitate optimal ADL/functional mobility performance.      If plan is discharge home, recommend the following:   Two people to help with walking and/or transfers;Two people to help with bathing/dressing/bathroom     Functional Status Assessment   Patient has had a recent decline in their functional status and demonstrates the ability to make significant improvements in function in a reasonable and predictable amount of time.     Equipment Recommendations   Other (comment) (defer to next venue of care)     Recommendations for Other Services         Precautions/Restrictions   Precautions Precautions: Fall Recall of  Precautions/Restrictions: Impaired Restrictions Weight Bearing Restrictions Per Provider Order: No     Mobility Bed Mobility Overal bed mobility: Needs Assistance Bed Mobility: Supine to Sit, Sit to Supine     Supine to sit: Max assist, +2 for physical assistance, +2 for safety/equipment, HOB elevated, Total assist Sit to supine: Max assist, +2 for physical assistance, +2 for safety/equipment, Total assist   General bed mobility comments: step by step multi modal cues for technique    Transfers                   General transfer comment: NT      Balance Overall balance assessment: Needs assistance Sitting-balance support: Feet supported Sitting balance-Leahy Scale: Zero Sitting balance - Comments: MAX A +1 static sitting on edge of bed for approx 5 minutes                                   ADL either performed or assessed with clinical judgement   ADL Overall ADL's : Needs assistance/impaired Eating/Feeding: Maximal assistance;Bed level;Cueing for sequencing;Cueing for compensatory techinques   Grooming: Maximal assistance;Bed level;Cueing for sequencing;Cueing for compensatory techniques               Lower Body Dressing: Total assistance;Bed level Lower Body Dressing Details (indicate cue type and reason): donn/doff socks                     Vision   Additional Comments: will continue to assess     Perception  Praxis         Pertinent Vitals/Pain Pain Assessment Pain Assessment: PAINAD Breathing: normal Negative Vocalization: occasional moan/groan, low speech, negative/disapproving quality Facial Expression: sad, frightened, frown Body Language: relaxed Consolability: distracted or reassured by voice/touch PAINAD Score: 3 Pain Intervention(s): Monitored during session, Repositioned, Limited activity within patient's tolerance     Extremity/Trunk Assessment Upper Extremity Assessment Upper Extremity  Assessment: Right hand dominant;RUE deficits/detail;Generalized weakness;Difficult to assess due to impaired cognition RUE Deficits / Details: Global generalized weakness, ?tone in R elbow, nurse/MD notified   Lower Extremity Assessment Lower Extremity Assessment: Difficult to assess due to impaired cognition;RLE deficits/detail;Defer to PT evaluation RLE Deficits / Details: wincing/grimacing with attempts at R ankle flexion, unable to flex to neutral       Communication Communication Communication: Impaired Factors Affecting Communication: Difficulty expressing self   Cognition Arousal: Lethargic Behavior During Therapy: Flat affect Cognition: History of cognitive impairments, No family/caregiver present to determine baseline, Cognition impaired   Orientation impairments: Place, Time, Situation Awareness: Intellectual awareness impaired, Online awareness impaired Memory impairment (select all impairments): Short-term memory, Non-declarative long-term memory, Declarative long-term memory Attention impairment (select first level of impairment): Focused attention Executive functioning impairment (select all impairments): Initiation, Organization, Sequencing, Reasoning, Problem solving                   Following commands: Impaired Following commands impaired: Follows one step commands with increased time     Cueing  General Comments   Cueing Techniques: Verbal cues;Gestural cues;Tactile cues;Visual cues  vss   Exercises Other Exercises Other Exercises: edu re role of OT, role of rehab   Shoulder Instructions      Home Living Family/patient expects to be discharged to:: Skilled nursing facility                                 Additional Comments: Per chart, pt is from SNF      Prior Functioning/Environment Prior Level of Function : Patient poor historian/Family not available;Needs assist             Mobility Comments: poor historian, pt  endorses she does not get out of bed at facility but need to confirm ADLs Comments: assist for all ADL/IADL at facility    OT Problem List: Decreased strength;Decreased range of motion;Decreased activity tolerance;Impaired balance (sitting and/or standing);Decreased cognition;Decreased knowledge of precautions;Decreased knowledge of use of DME or AE;Impaired UE functional use   OT Treatment/Interventions: Self-care/ADL training;Therapeutic exercise;Energy conservation;DME and/or AE instruction;Therapeutic activities;Visual/perceptual remediation/compensation;Patient/family education;Balance training      OT Goals(Current goals can be found in the care plan section)   Acute Rehab OT Goals Patient Stated Goal: eat Time For Goal Achievement: 04/10/24 Potential to Achieve Goals: Good ADL Goals Pt Will Perform Grooming: with mod assist Pt Will Perform Lower Body Dressing: with mod assist Pt Will Transfer to Toilet: with max assist;stand pivot transfer;bedside commode Pt Will Perform Toileting - Clothing Manipulation and hygiene: with max assist   OT Frequency:  Min 2X/week    Co-evaluation PT/OT/SLP Co-Evaluation/Treatment: Yes Reason for Co-Treatment: For patient/therapist safety;Necessary to address cognition/behavior during functional activity;To address functional/ADL transfers;Complexity of the patient's impairments (multi-system involvement)   OT goals addressed during session: ADL's and self-care;Proper use of Adaptive equipment and DME      AM-PAC OT 6 Clicks Daily Activity     Outcome Measure Help from another person eating meals?: A Lot Help from another person taking  care of personal grooming?: A Lot Help from another person toileting, which includes using toliet, bedpan, or urinal?: Total Help from another person bathing (including washing, rinsing, drying)?: Total Help from another person to put on and taking off regular upper body clothing?: A Lot Help from another  person to put on and taking off regular lower body clothing?: Total 6 Click Score: 9   End of Session Nurse Communication: Mobility status (team re: RUE/LE weakness)  Activity Tolerance: Patient limited by lethargy Patient left: in bed;with call bell/phone within reach;with bed alarm set  OT Visit Diagnosis: Other abnormalities of gait and mobility (R26.89);Muscle weakness (generalized) (M62.81);Other symptoms and signs involving the nervous system (R29.898);Other symptoms and signs involving cognitive function                Time: 9076-9061 OT Time Calculation (min): 15 min Charges:  OT General Charges $OT Visit: 1 Visit OT Evaluation $OT Eval Moderate Complexity: 1 Mod  Therisa Sheffield, OTD OTR/L  03/27/2024, 10:40 AM

## 2024-03-27 NOTE — Progress Notes (Addendum)
" °  PROGRESS NOTE    Alicia Brennan  [name] DOB: 06/24/1950 DOA: 03/23/2024 PCP: Alicia Brennan [name], MD  113A/[id number]-AA  LOS: 3 days   Brief hospital course:   Assessment & Plan: Alicia Brennan is a 74 y.o. African-American female with medical history significant for osteoarthritis, essential hypertension, IBS, continue lymphedema, sarcoidosis, seizures and vascular dementia, who presented to the emergency room with acute onset of altered mental status.  She has been looking at her daughter and SNF staff but has not been answering questions or following commands.  It is unclear when the last time she was at her normal baseline.    Sepsis ruled out --no definite infectious source  Bacteruria  --urine cx grew multiple species, likely chronic colonization.  Completed 3 days of empiric ceftriaxone .  Hypoglycemia, resolved - This is likely contributing to her acute metabolic encephalopathy. --encourage oral intake  Acute metabolic encephalopathy Underlying dementia --nursing assistance with feeding  Hypokalemia --monitor and supplement PRN  Essential hypertension --hold hydralazine  and irbesartan  due to hypotension  Seizure disorder (HCC) --d/c'ed Lamictal , since no mention of this in most recent neuro note. --cont Keppra , Vimpat  and Depakote   Depression - Continue Zoloft .  GERD without esophagitis - Continue PPI therapy.  Right-sided weakness --appears to be chronic, with associated right foot drop. --MRI brain neg for acute or previous strokes    DVT prophylaxis: Lovenox  SQ Code Status: Full code  Family Communication:  Level of care: Telemetry Dispo:   The patient is from: SNF rehab Anticipated d/c is to: SNF rehab Anticipated d/c date is: whenever SNF accepts   Subjective and Interval History:  IV from right forearm removed, right forearm less edematous today and not painful to palpation.    Due to weakness of right side observed by PT and RN, MRI  brain performed today, which was neg for stroke.   Objective: Vitals:   03/27/24 0459 03/27/24 0754 03/27/24 1602 03/27/24 2042  BP: 127/65 136/63 (!) 164/67 (!) 129/51  Pulse: 71 64 67 69  Resp: 17 16 16 20   Temp: 97.6 F (36.4 C) 98.4 F (36.9 C) 98.1 F (36.7 C) 98.4 F (36.9 C)  TempSrc:    Oral  SpO2: 96% 97% 100% 97%  Weight:      Height:       No intake or output data in the 24 hours ending 03/27/24 2250  Filed Weights   03/24/24 0446  Weight: 69.1 kg    Examination:   Constitutional: NAD, awake HEENT: conjunctivae and lids normal, EOMI CV: No cyanosis.   RESP: normal respiratory effort, on RA Extremities: R forearm less swollen, right foot drop SKIN: warm, dry   Data Reviewed: I have personally reviewed labs and imaging studies  Time spent: 35 minutes  Alicia Haber, MD [facility] If 7PM-7AM, please contact night-coverage 03/27/2024, 10:50 PM   "

## 2024-03-27 NOTE — Plan of Care (Signed)
  Problem: Nutrition: Goal: Adequate nutrition will be maintained Outcome: Progressing   Problem: Coping: Goal: Level of anxiety will decrease Outcome: Progressing   Problem: Pain Managment: Goal: General experience of comfort will improve and/or be controlled Outcome: Progressing   Problem: Safety: Goal: Ability to remain free from injury will improve Outcome: Progressing

## 2024-03-27 NOTE — Evaluation (Signed)
 Physical Therapy Evaluation Patient Details Name: Alicia Brennan MRN: [id number] DOB: 12/04/1950 Today's Date: 03/27/2024  History of Present Illness  Pt is a 74 year old female presented to the emergency room with acute onset of altered mental status, MD work up includes SIRS, bacteruria, hypoglycemia, hypokalemia     PMH significant for osteoarthritis, essential hypertension, IBS, continue lymphedema, sarcoidosis, seizures and vascular dementia   Clinical Impression  Patient received in bed, OT present in room. Patient oriented to self. Delayed responses. She requires +2 total A for bed mobility and max assist for seated balance at edge of bed. Increased tone noted in R UE and R LE with passive movement. Patient unable to follow direction to raise arms or legs when asked.  Unsure what her baseline mobility is, as she is not able to tell us  and family is not present. Will continue to follow patient while here to work on mobility.         If plan is discharge home, recommend the following: Two people to help with walking and/or transfers;A lot of help with bathing/dressing/bathroom   Can travel by private vehicle   No    Equipment Recommendations None recommended by PT  Recommendations for Other Services       Functional Status Assessment Patient has had a recent decline in their functional status and/or demonstrates limited ability to make significant improvements in function in a reasonable and predictable amount of time     Precautions / Restrictions Precautions Precautions: Fall Recall of Precautions/Restrictions: Impaired Restrictions Weight Bearing Restrictions Per Provider Order: No      Mobility  Bed Mobility Overal bed mobility: Needs Assistance Bed Mobility: Supine to Sit, Sit to Supine     Supine to sit: +2 for physical assistance, Total assist Sit to supine: Total assist, +2 for physical assistance   General bed mobility comments: No assistance provided by patient  to move to edge of bed or scoot.    Transfers                   General transfer comment: unable    Ambulation/Gait               General Gait Details: unable  Stairs            Wheelchair Mobility     Tilt Bed    Modified Rankin (Stroke Patients Only)       Balance Overall balance assessment: Needs assistance Sitting-balance support: Feet supported Sitting balance-Leahy Scale: Zero Sitting balance - Comments: MAX A +1 static sitting on edge of bed for approx 5 minutes Postural control: Posterior lean, Left lateral lean                                   Pertinent Vitals/Pain Pain Assessment Pain Assessment: PAINAD Negative Vocalization: occasional moan/groan, low speech, negative/disapproving quality Facial Expression: facial grimacing Body Language: relaxed Consolability: no need to console Pain Intervention(s): Repositioned    Home Living Family/patient expects to be discharged to:: Skilled nursing facility                   Additional Comments: Per chart, pt is from SNF    Prior Function Prior Level of Function : Patient poor historian/Family not available;Needs assist             Mobility Comments: poor historian, pt endorses she does not get out of bed  at facility but need to confirm ADLs Comments: assist for all ADL/IADL at facility     Extremity/Trunk Assessment   Upper Extremity Assessment Upper Extremity Assessment: Defer to OT evaluation RUE Deficits / Details: Global generalized weakness, ?tone in R elbow, nurse/MD notified    Lower Extremity Assessment Lower Extremity Assessment: Generalized weakness;RLE deficits/detail;LLE deficits/detail;Difficult to assess due to impaired cognition RLE Deficits / Details: Unable to extend knees actively, increased tone in R LE with passive knee extension RLE Coordination: decreased gross motor LLE Deficits / Details: unable to extend knees with direction.  Increased tone in knees with passive extension LLE Coordination: decreased gross motor    Cervical / Trunk Assessment Cervical / Trunk Assessment: Kyphotic  Communication   Communication Communication: Impaired Factors Affecting Communication: Difficulty expressing self    Cognition Arousal: Alert Behavior During Therapy: WFL for tasks assessed/performed   PT - Cognitive impairments: History of cognitive impairments                         Following commands: Impaired Following commands impaired: Follows one step commands with increased time     Cueing Cueing Techniques: Verbal cues, Gestural cues, Tactile cues, Visual cues     General Comments General comments (skin integrity, edema, etc.): vss    Exercises     Assessment/Plan    PT Assessment Patient needs continued PT services  PT Problem List Decreased strength;Decreased activity tolerance;Decreased balance;Decreased mobility;Decreased cognition       PT Treatment Interventions Functional mobility training;Therapeutic activities;Therapeutic exercise;Balance training;Neuromuscular re-education;Patient/family education    PT Goals (Current goals can be found in the Care Plan section)  Acute Rehab PT Goals Patient Stated Goal: none stated PT Goal Formulation: Patient unable to participate in goal setting Time For Goal Achievement: 04/10/24    Frequency Min 1X/week     Co-evaluation PT/OT/SLP Co-Evaluation/Treatment: Yes Reason for Co-Treatment: For patient/therapist safety;Necessary to address cognition/behavior during functional activity;To address functional/ADL transfers PT goals addressed during session: Mobility/safety with mobility;Balance OT goals addressed during session: ADL's and self-care;Proper use of Adaptive equipment and DME       AM-PAC PT 6 Clicks Mobility  Outcome Measure Help needed turning from your back to your side while in a flat bed without using bedrails?: Total Help  needed moving from lying on your back to sitting on the side of a flat bed without using bedrails?: Total Help needed moving to and from a bed to a chair (including a wheelchair)?: Total Help needed standing up from a chair using your arms (e.g., wheelchair or bedside chair)?: Total Help needed to walk in hospital room?: Total Help needed climbing 3-5 steps with a railing? : Total 6 Click Score: 6    End of Session   Activity Tolerance: Patient tolerated treatment well Patient left: in bed;with call bell/phone within reach;with bed alarm set Nurse Communication: Mobility status PT Visit Diagnosis: Other abnormalities of gait and mobility (R26.89)    Time: 9074-9064 PT Time Calculation (min) (ACUTE ONLY): 10 min   Charges:   PT Evaluation $PT Eval Low Complexity: 1 Low   PT General Charges $$ ACUTE PT VISIT: 1 Visit         Khiem Gargis, PT, GCS 03/27/2024,10:53 AM

## 2024-03-27 NOTE — Care Management Important Message (Signed)
 Important Message  Patient Details  Name: Alicia Brennan MRN: [id number] Date of Birth: 1950-04-28   Important Message Given:  Yes - Medicare IM     Yennifer Segovia W, CMA 03/27/2024, 11:58 AM

## 2024-03-28 DIAGNOSIS — N390 Urinary tract infection, site not specified: Secondary | ICD-10-CM | POA: Diagnosis not present

## 2024-03-28 DIAGNOSIS — A4150 Gram-negative sepsis, unspecified: Secondary | ICD-10-CM | POA: Diagnosis not present

## 2024-03-28 MED ORDER — IRBESARTAN 150 MG PO TABS
300.0000 mg | ORAL_TABLET | Freq: Every day | ORAL | Status: DC
  Administered 2024-03-28 – 2024-03-30 (×3): 300 mg via ORAL
  Filled 2024-03-28 (×3): qty 2

## 2024-03-28 MED ORDER — HYDRALAZINE HCL 50 MG PO TABS
25.0000 mg | ORAL_TABLET | Freq: Three times a day (TID) | ORAL | Status: DC
  Administered 2024-03-28 – 2024-03-29 (×4): 25 mg via ORAL
  Filled 2024-03-28 (×4): qty 1

## 2024-03-28 NOTE — Progress Notes (Signed)
" °  PROGRESS NOTE    Alicia Brennan  [name] DOB: 23-Nov-1950 DOA: 03/23/2024 PCP: [name], MD  113A/113A-AA  LOS: 4 days   Brief hospital course:   Assessment & Plan: Alicia Brennan is a 74 y.o. African-American female with medical history significant for osteoarthritis, essential hypertension, IBS, continue lymphedema, sarcoidosis, seizures and vascular dementia, who presented to the emergency room with acute onset of altered mental status.  She has been looking at her daughter and SNF staff but has not been answering questions or following commands.  It is unclear when the last time she was at her normal baseline.    Sepsis ruled out --no definite infectious source  Bacteruria  --urine cx grew multiple species, likely chronic colonization.  Completed 3 days of empiric ceftriaxone .  Hypoglycemia, resolved - This is likely contributing to her acute metabolic encephalopathy. --encourage oral intake  Acute metabolic encephalopathy Underlying dementia --nursing assistance with feeding  Hypokalemia --monitor and supplement PRN  Essential hypertension --resume hydralazine  and irbesartan   Seizure disorder (HCC) --d/c'ed Lamictal , since no mention of this in most recent neuro note. --cont Keppra , Vimpat  and Depakote   Depression - Continue Zoloft .  GERD without esophagitis --cont PPI  Right-sided weakness --appears to be chronic, with associated right foot drop. --MRI brain neg for acute or previous strokes   DVT prophylaxis: Lovenox  SQ Code Status: Full code  Family Communication: daughter and son updated on the phone today Level of care: Telemetry Dispo:   The patient is from: SNF rehab Anticipated d/c is to: SNF rehab Anticipated d/c date is: whenever SNF accepts   Subjective and Interval History:  Sleeping most of the time.   Objective: Vitals:   03/28/24 0416 03/28/24 0830 03/28/24 1632 03/28/24 2013  BP: (!) 169/68 (!) 173/77 (!)  175/84 (!) 141/69  Pulse: 71 86 89 (!) 103  Resp: 18 18 18 16   Temp: 98.3 F (36.8 C) 97.8 F (36.6 C) 98.4 F (36.9 C) 99.1 F (37.3 C)  TempSrc: Oral Oral  Oral  SpO2: 98% 97% 100% 97%  Weight:      Height:       No intake or output data in the 24 hours ending 03/28/24 2020  Filed Weights   03/24/24 0446  Weight: 69.1 kg    Examination:   Constitutional: NAD CV: No cyanosis.   RESP: normal respiratory effort, on RA Extremities: right foot drop SKIN: warm, dry   Data Reviewed: I have personally reviewed labs and imaging studies  Time spent: 35 minutes  Ellouise Haber, MD [facility] If 7PM-7AM, please contact night-coverage 03/28/2024, 8:20 PM   "

## 2024-03-28 NOTE — TOC Progression Note (Signed)
 Transition of Care [facility]) - Progression Note    Patient Details  Name: Alicia Brennan MRN: [id number] Date of Birth: 1950/04/03  Transition of Care [facility]) CM/SW Contact  Alicia CHRISTELLA Ring, Alicia Brennan Phone Number: 03/28/2024, 4:31 PM  Clinical Narrative:    Patient is ready for discharge but it is the new year and her [employer] has termed and she now has Community Education Officer.  [facility] does not accept [facility].  Called and spoke with daughter, Alicia Brennan.  She reports that she was not very happy with [facility] and is okay with another facility if we could find one in [place] or [place] that would work because she is in [place] and her brother is in [place].  Bed search started.  1st choice is [facility] but they are out of network.   TOC will reach out to daughter once bed offers received.    Expected Discharge Plan: Skilled Nursing Facility Barriers to Discharge: Continued Medical Work up               Expected Discharge Plan and Services   Discharge Planning Services: CM Consult Post Acute Care Choice: Skilled Nursing Facility Living arrangements for the past 2 months: Skilled Nursing Facility                 DME Arranged: N/A         HH Arranged: NA           Social Drivers of Health (SDOH) Interventions SDOH Screenings   Food Insecurity: Food Insecurity Present (01/31/2024)  Housing: Low Risk (01/31/2024)  Transportation Needs: No Transportation Needs (01/31/2024)  Utilities: Not At Risk (01/31/2024)  Financial Resource Strain: Low Risk (11/16/2023)   Received from [facility]  Social Connections: Moderately Isolated (01/31/2024)  Tobacco Use: Low Risk (03/26/2024)    Readmission Risk Interventions     No data to display

## 2024-03-28 NOTE — Plan of Care (Signed)
  Problem: Activity: Goal: Risk for activity intolerance will decrease Outcome: Progressing   Problem: Coping: Goal: Level of anxiety will decrease Outcome: Progressing   Problem: Elimination: Goal: Will not experience complications related to urinary retention Outcome: Progressing   Problem: Safety: Goal: Ability to remain free from injury will improve Outcome: Progressing   

## 2024-03-28 NOTE — Plan of Care (Signed)

## 2024-03-29 ENCOUNTER — Inpatient Hospital Stay

## 2024-03-29 DIAGNOSIS — A4150 Gram-negative sepsis, unspecified: Secondary | ICD-10-CM | POA: Diagnosis not present

## 2024-03-29 DIAGNOSIS — N390 Urinary tract infection, site not specified: Secondary | ICD-10-CM | POA: Diagnosis not present

## 2024-03-29 LAB — CULTURE, BLOOD (ROUTINE X 2)
Culture: NO GROWTH
Culture: NO GROWTH

## 2024-03-29 LAB — RESP PANEL BY RT-PCR (RSV, FLU A&B, COVID)  RVPGX2
Influenza A by PCR: NEGATIVE
Influenza B by PCR: NEGATIVE
Resp Syncytial Virus by PCR: NEGATIVE
SARS Coronavirus 2 by RT PCR: NEGATIVE

## 2024-03-29 MED ORDER — HYDRALAZINE HCL 20 MG/ML IJ SOLN: 10.0000 mg | Freq: Four times a day (QID) | INTRAMUSCULAR | Status: DC | PRN

## 2024-03-29 MED ORDER — ACETAMINOPHEN 500 MG PO TABS
1000.0000 mg | ORAL_TABLET | Freq: Three times a day (TID) | ORAL | Status: DC | PRN
  Administered 2024-03-29: 1000 mg via ORAL
  Filled 2024-03-29: qty 2

## 2024-03-29 MED ORDER — HYDRALAZINE HCL 50 MG PO TABS
50.0000 mg | ORAL_TABLET | Freq: Three times a day (TID) | ORAL | Status: DC
  Administered 2024-03-29 – 2024-03-30 (×3): 50 mg via ORAL
  Filled 2024-03-29 (×3): qty 1

## 2024-03-29 NOTE — Discharge Instructions (Signed)
 Private Care Duty  [facility] [phone]  [email or web address]    All [facility] Senior Care Advisory services are available at no cost to you and come with our promise to help you find the best senior care options for your needs.  Our senior advisory services extend peace of mind and support to the families of older adults, ensuring they are well-informed and comfortable with the care decisions made.

## 2024-03-29 NOTE — Plan of Care (Signed)
  Problem: Pain Managment: Goal: General experience of comfort will improve and/or be controlled Outcome: Progressing   Problem: Safety: Goal: Ability to remain free from injury will improve Outcome: Progressing

## 2024-03-29 NOTE — Progress Notes (Signed)
" °  PROGRESS NOTE    Alicia Brennan  [name] DOB: 1950/04/06 DOA: 03/23/2024 PCP: [name], MD  113A/113A-AA  LOS: 5 days   Brief hospital course:   Assessment & Plan: Alicia Brennan is a 74 y.o. African-American female with medical history significant for osteoarthritis, essential hypertension, IBS, continue lymphedema, sarcoidosis, seizures and vascular dementia, who presented to the emergency room with acute onset of altered mental status.  She has been looking at her daughter and SNF staff but has not been answering questions or following commands.  It is unclear when the last time she was at her normal baseline.    Fever --unclear etiology.  Repeat CXR no acute finding.  Repeat resp panel neg.  Possible aspiration? --repeat blood cx --monitor for now  Bacteruria  --urine cx grew multiple species, likely chronic colonization.  Completed 3 days of empiric ceftriaxone .  Hypoglycemia, resolved - This is likely contributing to her acute metabolic encephalopathy. --encourage oral intake  Acute metabolic encephalopathy Underlying dementia --nursing assistance with feeding  Hypokalemia --monitor and supplement PRN  Essential hypertension --BP high today --cont irbesartan  --increase hydralazine  to 50 TID --IV hydralazine  PRN  Seizure disorder (HCC) --d/c'ed Lamictal , since no mention of this in most recent neuro note. --cont Keppra , Vimpat  and Depakote   Depression --cont Zoloft   GERD without esophagitis --cont PPI  Right-sided weakness --appears to be chronic, with associated right foot drop. --MRI brain neg for acute or previous strokes  Sepsis ruled out --no definite infectious source   DVT prophylaxis: Lovenox  SQ Code Status: Full code  Family Communication:  Level of care: Telemetry Dispo:   The patient is from: SNF rehab Anticipated d/c is to: SNF rehab Anticipated d/c date is: 1-2 days   Subjective and Interval History:  Pt had  a fever this afternoon.  Sleeping most of the time.   Objective: Vitals:   03/29/24 1612 03/29/24 1626 03/29/24 1729 03/29/24 1847  BP: (!) 180/90 (!) 174/77 (!) 183/79 (!) 187/84  Pulse: (!) 113 (!) 111 (!) 115 (!) 110  Resp: (!) 22 (!) 24 (!) 24 (!) 22  Temp: (!) 101.7 F (38.7 C) (!) 103 F (39.4 C) (!) 102.3 F (39.1 C) (!) 100.8 F (38.2 C)  TempSrc:  Axillary Axillary Oral  SpO2: 95% 99% 96% 96%  Weight:      Height:        Intake/Output Summary (Last 24 hours) at 03/29/2024 1912 Last data filed at 03/29/2024 1333 Gross per 24 hour  Intake --  Output 650 ml  Net -650 ml    Filed Weights   03/24/24 0446  Weight: 69.1 kg    Examination:   Constitutional: NAD CV: No cyanosis.   RESP: normal respiratory effort, on RA   Data Reviewed: I have personally reviewed labs and imaging studies  Time spent: 50 minutes  Ellouise Haber, MD [facility] If 7PM-7AM, please contact night-coverage 03/29/2024, 7:12 PM   "

## 2024-03-29 NOTE — TOC Progression Note (Signed)
 Transition of Care [facility]) - Progression Note    Patient Details  Name: Alicia Brennan MRN: [id number] Date of Birth: [birth year]  Transition [facility]) CM/SW Contact  Carneshia Raker L Jameica Couts, [place] Phone Number: 03/29/2024, 1:17 PM  Clinical Narrative:     CSW spoke with patient daughter, [name], regarding bed offers. [name] declined the bed offers. CSW advised of offers still pending and encouraged daughter to follow up with those facilities. More offers are expected to come in by Monday.   Resources added to the AVS for private duty and care patrol.   Expected Discharge Plan: Skilled Nursing Facility Barriers to Discharge: Continued Medical Work up               Expected Discharge Plan and Services   Discharge Planning Services: CM Consult Post Acute Care Choice: Skilled Nursing Facility Living arrangements for the past 2 months: Skilled Nursing Facility                 DME Arranged: N/A         HH Arranged: NA           Social Drivers of Health (SDOH) Interventions SDOH Screenings   Food Insecurity: Food Insecurity Present (01/31/2024)  Housing: Low Risk (01/31/2024)  Transportation Needs: No Transportation Needs (01/31/2024)  Utilities: Not At Risk (01/31/2024)  Financial Resource Strain: Low Risk (11/16/2023)   Received from [facility]  Social Connections: Moderately Isolated (01/31/2024)  Tobacco Use: Low Risk (03/26/2024)    Readmission Risk Interventions     No data to display

## 2024-03-30 ENCOUNTER — Encounter (HOSPITAL_COMMUNITY): Payer: Self-pay

## 2024-03-30 DIAGNOSIS — G40801 Other epilepsy, not intractable, with status epilepticus: Secondary | ICD-10-CM

## 2024-03-30 DIAGNOSIS — F0390 Unspecified dementia without behavioral disturbance: Secondary | ICD-10-CM

## 2024-03-30 DIAGNOSIS — A4150 Gram-negative sepsis, unspecified: Secondary | ICD-10-CM | POA: Diagnosis not present

## 2024-03-30 DIAGNOSIS — B999 Unspecified infectious disease: Secondary | ICD-10-CM | POA: Diagnosis not present

## 2024-03-30 DIAGNOSIS — N390 Urinary tract infection, site not specified: Secondary | ICD-10-CM | POA: Diagnosis not present

## 2024-03-30 LAB — CK: Total CK: 100 U/L (ref 38–234)

## 2024-03-30 LAB — PROCALCITONIN: Procalcitonin: 0.33 ng/mL

## 2024-03-30 MED ORDER — SODIUM CHLORIDE 0.9 % IV SOLN
2.0000 g | INTRAVENOUS | Status: DC
  Administered 2024-03-30 – 2024-03-31 (×4): 2 g via INTRAVENOUS
  Filled 2024-03-30 (×7): qty 2000

## 2024-03-30 MED ORDER — SODIUM CHLORIDE 0.9 % IV SOLN: 2.0000 g | Freq: Two times a day (BID) | INTRAVENOUS | Status: DC

## 2024-03-30 MED ORDER — VANCOMYCIN HCL 1250 MG/250ML IV SOLN: 1250.0000 mg | INTRAVENOUS | Status: DC

## 2024-03-30 MED ORDER — SODIUM CHLORIDE 0.9 % IV SOLN: 125.0000 mL | INTRAVENOUS | Status: DC

## 2024-03-30 MED ORDER — LORAZEPAM 2 MG/ML IJ SOLN
0.5000 mg | Freq: Once | INTRAMUSCULAR | Status: AC
  Administered 2024-03-30: 0.5 mg via INTRAVENOUS
  Filled 2024-03-30: qty 1

## 2024-03-30 MED ORDER — DEXTROSE 5 % IV SOLN
10.0000 mg/kg | Freq: Three times a day (TID) | INTRAVENOUS | Status: DC
  Administered 2024-03-30 – 2024-03-31 (×3): 690 mg via INTRAVENOUS
  Filled 2024-03-30 (×4): qty 13.8

## 2024-03-30 MED ORDER — SODIUM CHLORIDE 0.9 % IV SOLN
50.0000 mg | Freq: Two times a day (BID) | INTRAVENOUS | Status: DC
  Administered 2024-03-30 – 2024-03-31 (×2): 50 mg via INTRAVENOUS
  Filled 2024-03-30 (×3): qty 5

## 2024-03-30 MED ORDER — LEVETIRACETAM (KEPPRA) 500 MG/5 ML ADULT IV PUSH
500.0000 mg | Freq: Two times a day (BID) | INTRAVENOUS | Status: DC
  Administered 2024-03-30 – 2024-03-31 (×2): 500 mg via INTRAVENOUS
  Filled 2024-03-30 (×3): qty 5

## 2024-03-30 MED ORDER — DEXTROSE 5 % IV SOLN: 10.0000 mg/kg | Freq: Three times a day (TID) | INTRAVENOUS | Status: DC

## 2024-03-30 MED ORDER — VANCOMYCIN HCL 1500 MG/300ML IV SOLN
1500.0000 mg | Freq: Once | INTRAVENOUS | Status: AC
  Administered 2024-03-31: 1500 mg via INTRAVENOUS
  Filled 2024-03-30: qty 300

## 2024-03-30 MED ORDER — SODIUM CHLORIDE 0.9 % IV BOLUS
1000.0000 mL | Freq: Once | INTRAVENOUS | Status: AC
  Administered 2024-03-30: 1000 mL via INTRAVENOUS

## 2024-03-30 MED ORDER — SODIUM CHLORIDE 0.9 % IV SOLN: 2.0000 g | INTRAVENOUS | Status: DC

## 2024-03-30 MED ORDER — SODIUM CHLORIDE 0.9 % IV SOLN: INTRAVENOUS | Status: DC

## 2024-03-30 MED ORDER — SODIUM CHLORIDE 0.9 % IV SOLN
2.0000 g | Freq: Two times a day (BID) | INTRAVENOUS | Status: DC
  Administered 2024-03-30 – 2024-03-31 (×2): 2 g via INTRAVENOUS
  Filled 2024-03-30 (×3): qty 20

## 2024-03-30 NOTE — Progress Notes (Signed)
 Physical Therapy Treatment Patient Details Name: Alicia Brennan MRN: [id number] DOB: [birth year] Today's Date: 03/30/2024   History of Present Illness Pt is a 74 year old female presented to the emergency room with acute onset of altered mental status, MD work up includes SIRS, bacteruria, hypoglycemia, hypokalemia     PMH significant for osteoarthritis, essential hypertension, IBS, continue lymphedema, sarcoidosis, seizures and vascular dementia    PT Comments  Pt in bed.  Opens eyes briefly but does keep them closed through most of session.  BLE AA/PROM x 10.  Le's generally still and painful initially but do loosen up and appears more comfortable with less grimmacing with reps.  Cued pt to roll left and right and assist with task but she is unable to reach and hold rail with either hand.  Dependant assist to reposition on R hip for changing position, skin integrity and general comfort.  Pt does voice she feels better after session.  Per chart [facility] lives at [facility].  Hoyer type lift is recommended for any transfers with nursing staff and OOB needs.     If plan is discharge home, recommend the following: Two people to help with walking and/or transfers;A lot of help with bathing/dressing/bathroom;Assistance with cooking/housework;Assist for transportation;Help with stairs or ramp for entrance   Can travel by private vehicle     No  Equipment Recommendations  None recommended by PT    Recommendations for Other Services       Precautions / Restrictions Precautions Precautions: Fall Recall of Precautions/Restrictions: Impaired Restrictions Weight Bearing Restrictions Per Provider Order: No     Mobility  Bed Mobility Overal bed mobility: Needs Assistance Bed Mobility: Rolling Rolling: Total assist         General bed mobility comments: cues to reach for rails right and left to assist with rolling but she makes no effort to assist. Patient Response: Flat affect  Transfers                    General transfer comment: unable - would be Deitra style lift appropriate.  no [facility].    Ambulation/Gait               General Gait Details: unable   Stairs             Wheelchair Mobility     Tilt Bed Tilt Bed Patient Response: Flat affect  Modified Rankin (Stroke Patients Only)       Balance                                            Communication Communication Communication: Impaired Factors Affecting Communication: Difficulty expressing self  Cognition Arousal: Alert Behavior During Therapy: WFL for tasks assessed/performed, Flat affect   PT - Cognitive impairments: History of cognitive impairments                       PT - Cognition Comments: generally non-verbal during session but when repostitioned and I asked if she felt better she said yes Following commands: Impaired Following commands impaired: Follows one step commands with increased time    Cueing Cueing Techniques: Verbal cues, Gestural cues, Tactile cues, Visual cues  Exercises Other Exercises Other Exercises: BLE AA/PROM x 10    General Comments        Pertinent Vitals/Pain Pain Assessment Pain Assessment: Faces Faces Pain  Scale: Hurts whole lot Pain Location: initially wiht ROM but does ease with reps Pain Descriptors / Indicators: Grimacing, Sore Pain Intervention(s): Limited activity within patient's tolerance, Monitored during session, Repositioned    Home Living                          Prior Function            PT Goals (current goals can now be found in the care plan section) Progress towards PT goals: Progressing toward goals    Frequency    Min 1X/week      PT Plan      Co-evaluation              AM-PAC PT 6 Clicks Mobility   Outcome Measure  Help needed turning from your back to your side while in a flat bed without using bedrails?: Total   Help needed moving to and from a bed to a chair  (including a wheelchair)?: Total Help needed standing up from a chair using your arms (e.g., wheelchair or bedside chair)?: Total Help needed to walk in hospital room?: Total Help needed climbing 3-5 steps with a railing? : Total 6 Click Score: 5    End of Session   Activity Tolerance: Patient tolerated treatment well Patient left: in bed;with call bell/phone within reach;with bed alarm set Nurse Communication: Mobility status PT Visit Diagnosis: Other abnormalities of gait and mobility (R26.89)     Time: 9146-9098 PT Time Calculation (min) (ACUTE ONLY): 8 min  Charges:    $Therapeutic Exercise: 8-22 mins PT General Charges $$ ACUTE PT VISIT: 1 Visit                   Lauraine Gills, PTA 03/30/2024, 9:26 AM

## 2024-03-30 NOTE — Plan of Care (Addendum)
 [facility] emergency department [place] progressive unit transfer:  Alicia Brennan Female, 74 y.o., 05-03-1950  MRN: [id number] Bed: 113A-AA Code: FULL  74 year old female past medical history of chronic right sided weakness with associated right foot drop, essential hypertension, osteoarthritis, IBS, lymphedema, sarcoidosis seizure and vascular dementia presented to emergency department and admitted for fever concern for aspiration.  Further workup revealed patient has bacteriuria likely colonization completed 7 days course of ceftriaxone .  Patient also found to have acute metabolic encephalopathy from underlying dementia as well. Sepsis has been ruled out initially.  For the management of seizure patient is on Keppra , Vimpat  and Depakote .  Per Dr. Awanda patient was getting ready to discharge however today she spiked fever and in the evening around 6:55 PM patient developed shaking however at bedside evaluation it seems like patient has high-grade fever that is causing shaking without any seizure activity.  Patient's family was very concerned and they wanted to transfer the patient to [facility] for further workup for seizure. Patient has repeat chest x-ray which is negative.  Pending repeat UA and respiratory panel.   Previous blood culture, urine culture and respiratory panel is negative.  At this time there is no source of infection identified yet. Patient is a spiking fever and borderline hypotensive concern for development of sepsis without identifiable source over yet.  Dr. Awanda spoke with neurology Dr. Lyndall recommended to transfer to [facility] for continuous EEG monitoring and need to rule out meningitis and recommended to get a LP done tomorrow and start broad-spectrum antibiotic coverage for empiric treatment for meningitis. Patient being started on IV vancomycin , ceftriaxone , ampicillin  and acyclovir .  Accepting patient to transfer to [facility] progressive unit for sepsis of unknown  origin, concern for meningitis and need for EEG from underlying seizure.   [name], MD [facility] 03/30/2024, 7:58 PM

## 2024-03-30 NOTE — Progress Notes (Signed)
 RN called to patient room 2 RN at bedside. Patient appears to be having seizure activity, no response to calling name and shaking patient, sternum rub patient open eyes and moved left arm. See flowsheet for VS. Dr. Awanda at patient bedside states that she do not think patient is having seizure activity, aware of patient temp will given Tylenol , MD states that she will start patient on IV fluids, and wait on neuro recommendation. Dr. Awanda spoke with patient daughter.

## 2024-03-30 NOTE — Plan of Care (Signed)

## 2024-03-30 NOTE — Progress Notes (Signed)
" °   03/30/24 1138  Assess: if the MEWS score is Yellow or Red  Were vital signs accurate and taken at a resting state? Yes  Does the patient meet 2 or more of the SIRS criteria? No  Does the patient have a confirmed or suspected source of infection? No  MEWS guidelines implemented  No, previously yellow, continue vital signs every 4 hours    "

## 2024-03-30 NOTE — Consult Note (Signed)
 NEUROLOGY CONSULT NOTE   Date of service: March 30, 2024 Patient Name: Alicia Brennan MRN:  [id number] DOB:  1950/06/29 Chief Complaint: Decreased speech Requesting Provider: Awanda City, MD  History of Present Illness  Alicia Brennan is a 74 y.o. female with hx of advanced dementia at baseline is only able to say one or two words.  Has a history of nonconvulsive seizures and is currently on three antiepileptics but was found to have decreased mental status at her facility and was brought in for evaluation.  In the hospital, she has had a fever which was initially attributed to urinary tract infection, and indeed her initial UA showed findings concerning for UTI and she was started on ceftriaxone , however the urine culture grew multiple species.  She continues to be worse than typical, continues to have fevers, and there is some concern for seizures and therefore neurology has been consulted.  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   Foley catheter in place    Hypertension    IBS (irritable bowel syndrome)    Leg fracture, left    Lymphedema    Mood disturbance    Recurrent headache    Sarcoidosis of lung    Seizures (HCC)    Thrombocytopenia    Vascular dementia (HCC) 01/31/2024   Wound of buttock    Right, pressure wound    Past Surgical History:  Procedure Laterality Date   BILATERAL ANTERIOR TOTAL HIP ARTHROPLASTY Bilateral 11/15/2015   Procedure: BILATERAL ANTERIOR TOTAL HIP ARTHROPLASTY;  Surgeon: Redell Shoals, MD;  Location: [facility] OR;  Service: Orthopedics;  Laterality: Bilateral;   CARPAL TUNNEL RELEASE Bilateral    CHOLECYSTECTOMY     COLONOSCOPY     CYSTOSCOPY W/ URETERAL STENT PLACEMENT Left 01/03/2024   Procedure: CYSTOSCOPY, WITH RETROGRADE PYELOGRAM AND URETERAL STENT INSERTION;  Surgeon: Devere Lonni Righter, MD;  Location: [facility];  Service: Urology;  Laterality: Left;   CYSTOSCOPY/URETEROSCOPY/HOLMIUM  LASER/STENT PLACEMENT Left 02/13/2024   Procedure: CYSTOSCOPY/URETEROSCOPY/HOLMIUM LASER/STENT PLACEMENT;  Surgeon: Devere Lonni Righter, MD;  Location: [facility];  Service: Urology;  Laterality: Left;  CYSTOSCOPY/LEFT URETEROSCOPY/HOLMIUM LASER/STENT PLACEMENT   EYE SURGERY     r/t sarcoidisis   ORIF ELBOW FRACTURE Left    TIBIA FRACTURE SURGERY Left     Family History: Family History  Problem Relation Age of Onset   Dementia Mother    High blood pressure Father    Dementia Maternal Aunt    Sarcoidosis Son    Seizures Neg Hx     Social History  reports that she has never smoked. She has never used smokeless tobacco. She reports that she does not drink alcohol  and does not use drugs.  Allergies[1]  Medications  Current Medications[2]  Vitals   Vitals:   03/29/24 2229 03/30/24 0228 03/30/24 1032 03/30/24 1438  BP: (!) 91/56 136/71 (!) 112/58 136/70  Pulse: (!) 114 96 (!) 119 (!) 118  Resp: 16 15 20 16   Temp: 100.3 F (37.9 C) 98.6 F (37 C) 98 F (36.7 C) 99.7 F (37.6 C)  TempSrc: Oral     SpO2: 93% 97% 95% 97%  Weight:      Height:        Body mass index is 26.15 kg/m.   Physical Exam   Constitutional: Appears well-developed and well-nourished.   Neurologic Examination    Neuro: Mental Status: Patient is awake, alert, she attempts to speak when  I ask her her name, but I am unable to understand her.  She tracks me around the room. Cranial Nerves: II: She blinks to threat bilaterally III,IV, VI: She does not look to either side very well, but does cross midline in both directions when I am applying noxious stimuli and looks to both sides.  She does fixate and track me across midline V: She blinks to eyelid stimulation bilaterally VII: Facial movement is symmetric.  Motor: Tone is increased with some tremor bilaterally, suspect paratonia Sensory: She grimaces to noxious stimulation x 4 Cerebellar: Does not perform  Labs/Imaging/Neurodiagnostic  studies   CBC:  Recent Labs  Lab 04-13-2024 1151 03/27/24 0510  WBC 5.9 4.6  HGB 12.5 10.7*  HCT 39.5 33.4*  MCV 94.3 93.6  PLT 210 181   Basic Metabolic Panel:  Lab Results  Component Value Date   NA 136 03/27/2024   K 3.5 03/27/2024   CO2 24 03/27/2024   GLUCOSE 107 (H) 03/27/2024   BUN 12 03/27/2024   CREATININE 0.60 03/27/2024   CALCIUM 8.8 (L) 03/27/2024   GFRNONAA >60 03/27/2024   GFRAA >60 08/16/2019   Lipid Panel: No results found for: LDLCALC HgbA1c: No results found for: HGBA1C Urine Drug Screen:     Component Value Date/Time   LABOPIA NONE DETECTED 04/25/2015 0825   COCAINSCRNUR NONE DETECTED 04/25/2015 0825   LABBENZ NONE DETECTED 04/25/2015 0825   AMPHETMU NONE DETECTED 04/25/2015 0825   THCU NONE DETECTED 04/25/2015 0825   LABBARB NONE DETECTED 04/25/2015 0825    Alcohol  Level     Component Value Date/Time   ETH <15 09/11/2023 2335   INR  Lab Results  Component Value Date   INR 1.2 2024/04/13   APTT  Lab Results  Component Value Date   APTT 30 11/27/2021   AED levels:  Lab Results  Component Value Date   LAMOTRIGINE  6.1 01/02/2024   LEVETIRACETA 28.6 01/02/2024    MRI Brain(Personally reviewed): Significant atrophy  ASSESSMENT   [name] is a 74 y.o. female with fevers of unknown origin and worsening mental status in the setting of dementia and history of nonconvulsive seizures.  She is on three antiepileptics, and unclear if she has been having seizures.  She does resist neck movement, but I do not think it is true meningismus given her resistance to movements everywhere else.  My suspicion is that her dementia is worsening in the setting of what ever is causing her fever, however it is difficult to rule out other possible contributing factors including NCSE or even CNS infection.  For now, I would favor broad coverage until an LP can be performed  RECOMMENDATIONS  Broad CNS coverage with ampicillin , vancomycin , ceftriaxone ,  acyclovir  until LP can be obtained.  Transfer to institution that can perform long-term monitoring EEG Conitnue vimpat  50 bid Continue depacon  250 tid Continue keppra  500mg  BID Will follow at [facility] if transferred ______________________________________________________________________    Signed, Aisha Seals, MD [facility]    [1] No Known Allergies [2]  Current Facility-Administered Medications:    [DISCONTINUED] acetaminophen  (TYLENOL ) tablet 650 mg, 650 mg, Oral, Q6H PRN **OR** acetaminophen  (TYLENOL ) suppository 650 mg, 650 mg, Rectal, Q6H PRN, Mansy, Jan A, MD, 650 mg at 03/29/24 1631   acetaminophen  (TYLENOL ) tablet 1,000 mg, 1,000 mg, Oral, TID PRN, Awanda City, MD, 1,000 mg at 03/29/24 2113   dextrose  50 % solution 50 mL, 1 ampule, Intravenous, Once, Mansy, Jan A, MD   divalproex  (DEPAKOTE ) DR tablet 250 mg,  250 mg, Oral, TID, Mansy, Jan A, MD, 250 mg at 03/30/24 1515   enoxaparin  (LOVENOX ) injection 40 mg, 40 mg, Subcutaneous, Q24H, Mansy, Jan A, MD, 40 mg at 03/30/24 9061   feeding supplement (ENSURE PLUS HIGH PROTEIN) liquid 237 mL, 237 mL, Oral, BID BM, Mansy, Jan A, MD, 237 mL at 03/29/24 1526   hydrALAZINE  (APRESOLINE ) injection 10 mg, 10 mg, Intravenous, Q6H PRN, Awanda City, MD   hydrALAZINE  (APRESOLINE ) tablet 50 mg, 50 mg, Oral, TID, Awanda City, MD, 50 mg at 03/30/24 1515   irbesartan  (AVAPRO ) tablet 300 mg, 300 mg, Oral, Daily, Awanda City, MD, 300 mg at 03/30/24 9062   lacosamide  (VIMPAT ) tablet 50 mg, 50 mg, Oral, BID, Mansy, Jan A, MD, 50 mg at 03/30/24 9061   lactulose  (CHRONULAC ) 10 GM/15ML solution 10 g, 10 g, Oral, Daily, Mansy, Jan A, MD, 10 g at 03/30/24 9048   levETIRAcetam  (KEPPRA ) tablet 500 mg, 500 mg, Oral, BID, Mansy, Jan A, MD, 500 mg at 03/30/24 9062   magnesium  hydroxide (MILK OF MAGNESIA) suspension 30 mL, 30 mL, Oral, Daily PRN, Mansy, Jan A, MD, 30 mL at 03/27/24 1735   ondansetron  (ZOFRAN ) tablet 4 mg, 4 mg, Oral, Q6H PRN **OR**  ondansetron  (ZOFRAN ) injection 4 mg, 4 mg, Intravenous, Q6H PRN, Mansy, Jan A, MD   pantoprazole  (PROTONIX ) EC tablet 40 mg, 40 mg, Oral, Daily, Mansy, Jan A, MD, 40 mg at 03/30/24 9062   senna (SENOKOT) tablet 8.6 mg, 1 tablet, Oral, QHS, Mansy, Jan A, MD, 8.6 mg at 03/29/24 2114   sertraline  (ZOLOFT ) tablet 25 mg, 25 mg, Oral, Daily, Mansy, Jan A, MD, 25 mg at 03/30/24 9061   traZODone  (DESYREL ) tablet 25 mg, 25 mg, Oral, QHS PRN, Mansy, [place] LABOR, MD

## 2024-03-30 NOTE — Progress Notes (Signed)
 Pharmacy Antibiotic Note  Alicia Brennan is a 74 y.o. female admitted on 03/23/2024 with altered mental status. PMH significant for osteoarthritis, essential hypertension, IBS, continue lymphedema, sarcoidosis, seizures and vascular dementia. Patient has worsening mental status and is febrile (Tmax 102.9) with WBC 4.6. Pharmacy has been consulted to start empiric meningitis coverage with vancomycin , ceftriaxone , ampicillin , and acyclovir .   Plan: Give vancomycin  1500 mg IV x1 Start vancomycin  1250 mg IV every 24 hours (CrCl 58 mL/min, Wt 69.1 kg) Start ceftriaxone  2g IV every 12 hours Start ampicillin  2 g IV every 4 hours Start acyclovir  690 mg IV every 8 hours Continue to monitor renal function, clinical status, culture data, and antibiotic LOT  Height: 5' 4 (162.6 cm) Weight: 69.1 kg (152 lb 5.4 oz) IBW/kg (Calculated) : 54.7  Temp (24hrs), Avg:99.9 F (37.7 C), Min:98 F (36.7 C), Max:102.9 F (39.4 C)  Recent Labs  Lab 03/23/24 2116 03/23/24 2245 03/24/24 0147 03/24/24 1151 03/27/24 0510  WBC 6.1  --   --  5.9 4.6  CREATININE  --  0.65  --  0.79 0.60  LATICACIDVEN  --   --  1.0  --   --     Estimated Creatinine Clearance: 58 mL/min (by C-G formula based on SCr of 0.6 mg/dL).    Allergies[1]  Antimicrobials this admission: vancomycin  01/04 >>  Ampicillin  01/04 >>  Ceftriaxone  01/04 >> Acyclovir  01/04 >>  Dose adjustments this admission:  N/A  Microbiology results: 01/03 BCx: NGTD  Thank you for involving pharmacy in this patient's care.   Damien Napoleon, PharmD Clinical Pharmacist 03/30/2024 8:11 PM     [1] No Known Allergies

## 2024-03-30 NOTE — Telephone Encounter (Signed)
 Patient's daughter called after hours resident requesting for possible consideration of transfer as she is at an outside hospital and believes that they do not understand her epilepsy and how to manage.  Discussed that she will need to discuss this with the primary team at the outside hospital.  Lolita Cope, MD Resident Physician - PGY4 Department of Neurology

## 2024-03-30 NOTE — Discharge Summary (Signed)
 "  Physician Discharge Summary   Alicia Brennan  female DOB: 1951-01-31  [name]  PCP: [name], MD  Admit date: 03/23/2024 Discharge date: 03/31/2024  Admitted From: SNF Disposition:  [place] Daughter updated at bedside prior to discharge. CODE STATUS: Full code  Discharge Instructions     No wound care   Complete by: As directed       Hospital Course:  For full details, please see H&P, progress notes, consult notes and ancillary notes.  Briefly,  Alicia Brennan is a 74 y.o. African-American female with medical history significant for essential hypertension, IBS, sarcoidosis, seizures and vascular dementia, who presented to the emergency room with acute onset of altered mental status.   Reportedly pt had been looking at her daughter and SNF staff but not answering questions or following commands.  Per son and daughter, pt normally talks.   Fever, SIRS --fever on presentation, and then and then on afternoon of 1/3 and 1/4.  No defined infectious source yet.  Repeat CXR no acute finding.  Repeat resp panel and RVP both neg.  Blood cx on presentation neg growth.  Urine cx on presentation multiple species (suggests chronic colonization).  Possible aspiration as cause of fevers considered and discussed with daughter, and offered to work up for silent aspirations, however, daughter insisted that fever was a result of pt having seizures. --started vanc, ceftriaxone , amp and acyclovir  on 1/4 for empiric coverage of meningitis, per neuro rec.  LP performed on 1/5, with prelim cell count neg for infection. --cont empiric vanc, ceftriaxone , amp and acyclovir  pending further neuro rec.  Concern for break-through seizures --Son expressed concern for seizure during the day of 1/4, citing pt not talking.  Neuro therefore consulted.  Later on 1/4, per daughter, fever is a symptom of pt having seizures.  Pt currently mostly unresponsive, with intermittent body shakes.    --pt presented with Keppra , Vimpat , Depakote  and Lamictal  on home med list.  Last neuro clinic visit in Nov only had pt on Keppra , Vimpat  and Depakote , therefore, Lamictal  was held, due to concern of pt's reduced responsiveness. --Family requested pt to be transferred to [facility] for continuous EEG monitoring.  Neuro Dr. Michaela approved of the transfer.  Pt was checked out to [facility] neuro hospitalist. --cont Keppra , Vimpat  and Depakote  in IV form if pt can not safely take oral.  Bacteruria  --urine cx grew multiple species, likely chronic colonization.  Completed 3 days of empiric ceftriaxone .   Hypoglycemia, resolved --likely due to poor oral intake. --encourage oral intake   Acute metabolic encephalopathy Underlying dementia --pt noted to pocket food. --nursing assistance with feeding --consider workup for silent aspirations.   Hypokalemia --monitored and supplemented PRN   Essential hypertension --BP varied widely.  Currently low. --hold home hydralazine  and telmisartan  Depression --cont Zoloft    GERD without esophagitis --cont PPI   Right-sided weakness --appears to be chronic, with associated right foot drop. --MRI brain neg for acute or previous strokes    Discharge Diagnoses:  Principal Problem:   Sepsis due to gram-negative UTI [facility]) Active Problems:   Hypoglycemia   Acute metabolic encephalopathy   Essential hypertension   Hypokalemia   Seizure disorder (HCC)   GERD without esophagitis   Depression   30 Day Unplanned Readmission Risk Score    Flowsheet Row ED to Hosp-Admission (Current) from 03/23/2024 in [facility] 1C MEDICAL TELEMETRY  30 Day Unplanned Readmission Risk Score (%) 23.95 Filed at 03/30/2024 2000  This score is the patient's risk of an unplanned readmission within 30 days of being discharged (0 -100%). The score is based on dignosis, age, lab data, medications, orders, and past utilization.   Low:  0-14.9    Medium: 15-21.9   High: 22-29.9   Extreme: 30 and above         Discharge Instructions:  Allergies as of 03/30/2024   No Known Allergies      Medication List     PAUSE taking these medications    furosemide  40 MG tablet Wait to take this until your doctor or other care provider tells you to start again. Due to low blood pressure. Commonly known as: LASIX  Take 1 tablet (40 mg total) by mouth daily as needed.   hydrALAZINE  25 MG tablet Wait to take this until your doctor or other care provider tells you to start again. Due to low blood pressure. Commonly known as: APRESOLINE  Take 1 tablet (25 mg total) by mouth every 8 (eight) hours.   lamoTRIgine  25 MG tablet Wait to take this until your doctor or other care provider tells you to start again. Neuro followup. Commonly known as: LAMICTAL  Take 25 mg by mouth 2 (two) times daily.   Nayzilam  5 MG/0.1ML Soln Wait to take this until your doctor or other care provider tells you to start again. Generic drug: Midazolam  GIVE ONE SPRAY INTO 1 NOSTRIL. IF NO RESPONSE IN 10 MINUTES, MAY GIVE SECOND SPRAY IN OTHER NOSTRIL. DO NOT GIVE SECOND DOSE IF PATIENT HAS TROUBLE BREATHING OR EXCESSIVE SEDATION, MAX 2 DOSES/SEIZURE EPISODE, 1 EPISODE EVERY 3 DAYS OR 5 EPISODES/MONTH Nasal for 6 Days   telmisartan 80 MG tablet Wait to take this until your doctor or other care provider tells you to start again. Due to low blood pressure. Commonly known as: MICARDIS Take 80 mg by mouth daily.       STOP taking these medications    docusate sodium  100 MG capsule Commonly known as: COLACE   senna 8.6 MG tablet Commonly known as: SENOKOT       TAKE these medications    acyclovir  690 mg in dextrose  5 % 100 mL Inject 690 mg into the vein every 8 (eight) hours.   ampicillin  2 g in sodium chloride  0.9 % 100 mL Inject 2 g into the vein every 4 (four) hours.   cefTRIAXone  2 g in sodium chloride  0.9 % 100 mL Inject 2 g into the vein  every 12 (twelve) hours.   divalproex  250 MG DR tablet Commonly known as: Depakote  Take 1 tablet (250 mg total) by mouth 3 (three) times daily.   feeding supplement Liqd Take 237 mLs by mouth 2 (two) times daily between meals.   lacosamide  50 MG Tabs tablet Commonly known as: VIMPAT  Take 1 tablet (50 mg total) by mouth 2 (two) times daily.   lactulose  10 GM/15ML solution Commonly known as: CHRONULAC  Take 15 mLs (10 g total) by mouth daily.   levETIRAcetam  500 MG tablet Commonly known as: KEPPRA  Take 1 tablet (500 mg total) by mouth 2 (two) times daily.   pantoprazole  40 MG tablet Commonly known as: Protonix  Take 1 tablet (40 mg total) by mouth daily.   sertraline  25 MG tablet Commonly known as: ZOLOFT  Take 25 mg by mouth daily.   sodium chloride  0.9 % infusion Inject 125 mLs into the vein continuous.   vancomycin  1250 MG/250ML Soln Commonly known as: VANCOREADY Inject 250 mLs (1,250 mg total) into the vein daily.  Start taking on: March 31, 2024          Allergies[1]   The results of significant diagnostics from this hospitalization (including imaging, microbiology, ancillary and laboratory) are listed below for reference.   Consultations:   Procedures/Studies: DG Chest Port 1 View Result Date: 03/29/2024 CLINICAL DATA:  Fever EXAM: PORTABLE CHEST 1 VIEW COMPARISON:  March 24, 2024 FINDINGS: The heart size and mediastinal contours are within normal limits. Right lung is clear. Minimal left lingular subsegmental atelectasis is noted. The visualized skeletal structures are unremarkable. IMPRESSION: Minimal left lingular subsegmental atelectasis. Electronically Signed   By: Lynwood Landy Raddle M.D.   On: 03/29/2024 17:55   MR BRAIN WO CONTRAST Result Date: 03/27/2024 EXAM: MRI BRAIN WITHOUT CONTRAST 03/27/2024 12:03:52 PM TECHNIQUE: Multiplanar multisequence MRI of the head/brain was performed without the administration of intravenous contrast. COMPARISON: MRI Head  Without IV contrast 01/02/2024; 09/12/2023. CLINICAL HISTORY: right-sided weakness FINDINGS: BRAIN AND VENTRICLES: Scattered and confluent T2 and FLAIR hyperintensity in the periventricular and subcortical white matter suggestive of moderate chronic microvascular ischemic changes. Moderate generalized parenchymal volume loss. There is concordant prominence of the lateral ventricles. No acute infarct. No intracranial hemorrhage. No mass. No midline shift. No hydrocephalus. The sella is unremarkable. Normal flow voids. ORBITS: No acute abnormality. SINUSES AND MASTOIDS: Moderate left mastoid effusion. BONES AND SOFT TISSUES: Normal marrow signal. No acute soft tissue abnormality. Degenerative changes in the visualized upper cervical spine. IMPRESSION: 1. No acute intracranial abnormality. 2. Moderate chronic microvascular ischemic changes. 3. Moderate generalized parenchymal volume loss with concordant prominence of the lateral ventricles. 4. Moderate left mastoid effusion. Electronically signed by: Donnice Mania MD 03/27/2024 01:54 PM EST RP Workstation: [id number]   US  Venous Img Upper Uni Right(DVT) Result Date: 03/27/2024 CLINICAL DATA:  74 year old female with history of right upper extremity pain and swelling. EXAM: RIGHT UPPER EXTREMITY VENOUS DOPPLER ULTRASOUND TECHNIQUE: Gray-scale sonography with graded compression, as well as color Doppler and duplex ultrasound were performed to evaluate the upper extremity deep venous system from the level of the subclavian vein and including the jugular, axillary, basilic, radial, ulnar and upper cephalic vein. Spectral Doppler was utilized to evaluate flow at rest and with distal augmentation maneuvers. COMPARISON:  None Available. FINDINGS: Contralateral Subclavian Vein: Respiratory phasicity is normal and symmetric with the symptomatic side. No evidence of thrombus. Normal compressibility. Internal Jugular Vein: No evidence of thrombus. Normal compressibility,  respiratory phasicity and response to augmentation. Subclavian Vein: No evidence of thrombus. Normal compressibility, respiratory phasicity and response to augmentation. Axillary Vein: No evidence of thrombus. Normal compressibility, respiratory phasicity and response to augmentation. Cephalic Vein: Not visualized. Basilic Vein: No evidence of thrombus. Normal compressibility, respiratory phasicity and response to augmentation. Brachial Veins: No evidence of thrombus. Normal compressibility, respiratory phasicity and response to augmentation. Radial Veins: No evidence of thrombus. Normal compressibility, respiratory phasicity and response to augmentation. Ulnar Veins: No evidence of thrombus. Normal compressibility, respiratory phasicity and response to augmentation. Other Findings:  None visualized. IMPRESSION: No evidence of superficial or deep venous thrombosis in the right upper extremity. Ester Sides, MD [facility] Electronically Signed   By: Ester Sides M.D.   On: 03/27/2024 08:49   DG Chest Portable 1 View Result Date: 03/24/2024 EXAM: 1 VIEW(S) XRAY OF THE CHEST 03/24/2024 02:34:00 AM COMPARISON: Portable chest 01/31/2024. CLINICAL HISTORY: fever, unclear source -- eval for pneumonia FINDINGS: LUNGS AND PLEURA: No focal pulmonary opacity. No pleural effusion. No pneumothorax. HEART AND MEDIASTINUM:  Stable mediastinum with aortic tortuosity and atherosclerosis. Mild cardiomegaly. No evidence of CHF. BONES AND SOFT TISSUES: Osteopenia with thoracic spondylosis. IMPRESSION: 1. No acute infiltrate is seen. 2. Mild cardiomegaly without evidence of CHF. Electronically signed by: Francis Quam MD 03/24/2024 02:57 AM EST RP Workstation: [id number]   CT HEAD WO CONTRAST ( ) Result Date: 03/23/2024 EXAM: CT HEAD WITHOUT CONTRAST 03/23/2024 09:04:55 PM TECHNIQUE: CT of the head was performed without the administration of intravenous contrast.  Automated exposure control, iterative reconstruction, and/or weight based adjustment of the mA/kV was utilized to reduce the radiation dose to as low as reasonably achievable. COMPARISON: 01/02/2024 CLINICAL HISTORY: Mental status change, unknown cause FINDINGS: BRAIN AND VENTRICLES: No acute hemorrhage. No evidence of acute infarct. Moderate global parenchymal volume loss slightly advanced in relation to the patient's stated age. Mild periventricular white matter changes are present likely reflective of small vessel ischemia. Mild ventriculomegaly is present, commensurate with a degree of parenchymal atrophy and likely representing ex vacuo dilation. This appears unchanged from prior examination. No extra-axial collection. No mass effect or midline shift. ORBITS: No acute abnormality. SINUSES: No acute abnormality. SOFT TISSUES AND SKULL: No acute soft tissue abnormality. No skull fracture. IMPRESSION: 1. No acute intracranial abnormality. 2. Moderate global parenchymal volume loss, slightly advanced for age, with mild periventricular white matter changes likely due to small vessel ischemia. 3. Mild ventriculomegaly, likely ex vacuo, unchanged. Electronically signed by: Dorethia Molt MD 03/23/2024 09:54 PM EST RP Workstation: [id number]      Labs: BNP (last 3 results) Recent Labs    09/12/23 1138  BNP 45.2   Basic Metabolic Panel: Recent Labs  Lab 03/23/24 2245 03/24/24 1151 03/27/24 0510  NA 142 140 136  K 3.1* 4.0 3.5  CL 110 105 102  CO2 19* 24 24  GLUCOSE 63* 141* 107*  BUN 10 10 12   CREATININE 0.65 0.79 0.60  CALCIUM 8.1* 9.4 8.8*  MG  --   --  1.9   Liver Function Tests: Recent Labs  Lab 03/23/24 2245  AST 32  ALT 11  ALKPHOS 69  BILITOT 0.8  PROT 6.5  ALBUMIN  2.9*   No results for input(s): LIPASE, AMYLASE in the last 168 hours. No results for input(s): AMMONIA in the last 168 hours. CBC: Recent Labs  Lab 03/23/24 2116 03/24/24 1151 03/27/24 0510  WBC 6.1  5.9 4.6  HGB 13.6 12.5 10.7*  HCT 43.1 39.5 33.4*  MCV 95.4 94.3 93.6  PLT 217 210 181   Cardiac Enzymes: No results for input(s): CKTOTAL, CKMB, CKMBINDEX, TROPONINI in the last 168 hours. BNP: Invalid input(s): POCBNP CBG: Recent Labs  Lab 03/25/24 0449 03/25/24 0752 03/25/24 1209 03/25/24 1638 03/25/24 2135  GLUCAP 121* 103* 137* 106* 114*   D-Dimer No results for input(s): DDIMER in the last 72 hours. Hgb A1c No results for input(s): HGBA1C in the last 72 hours. Lipid Profile No results for input(s): CHOL, HDL, LDLCALC, TRIG, CHOLHDL, LDLDIRECT in the last 72 hours. Thyroid function studies No results for input(s): TSH, T4TOTAL, T3FREE, THYROIDAB in the last 72 hours.  Invalid input(s): FREET3 Anemia work up No results for input(s): VITAMINB12, FOLATE, FERRITIN, TIBC, IRON, RETICCTPCT in the last 72 hours. Urinalysis    Component Value Date/Time   COLORURINE YELLOW (A) 03/24/2024 0155   APPEARANCEUR TURBID (A) 03/24/2024 0155   APPEARANCEUR Clear 05/17/2013 1525   LABSPEC 1.009 03/24/2024 0155   LABSPEC 1.019 05/17/2013 1525   PHURINE 6.0 03/24/2024 0155   GLUCOSEU NEGATIVE 03/24/2024 0155  GLUCOSEU Negative 05/17/2013 1525   HGBUR SMALL (A) 03/24/2024 0155   BILIRUBINUR NEGATIVE 03/24/2024 0155   BILIRUBINUR Negative 05/17/2013 1525   KETONESUR NEGATIVE 03/24/2024 0155   PROTEINUR 100 (A) 03/24/2024 0155   NITRITE NEGATIVE 03/24/2024 0155   LEUKOCYTESUR MODERATE (A) 03/24/2024 0155   LEUKOCYTESUR Trace 05/17/2013 1525   Sepsis Labs Recent Labs  Lab 03/23/24 2116 03/24/24 1151 03/27/24 0510  WBC 6.1 5.9 4.6   Microbiology Recent Results (from the past 240 hours)  Resp panel by RT-PCR (RSV, Flu A&B, Covid) Anterior Nasal Swab     Status: None   Collection Time: 03/23/24  9:16 PM   Specimen: Anterior Nasal Swab  Result Value Ref Range Status   SARS Coronavirus 2 by RT PCR NEGATIVE NEGATIVE Final     Comment: (NOTE) SARS-CoV-2 target nucleic acids are NOT DETECTED.  The SARS-CoV-2 RNA is generally detectable in upper respiratory specimens during the acute phase of infection. The lowest concentration of SARS-CoV-2 viral copies this assay can detect is 138 copies/mL. A negative result does not preclude SARS-Cov-2 infection and should not be used as the sole basis for treatment or other patient management decisions. A negative result may occur with  improper specimen collection/handling, submission of specimen other than nasopharyngeal swab, presence of viral mutation(s) within the areas targeted by this assay, and inadequate number of viral copies(<138 copies/mL). A negative result must be combined with clinical observations, patient history, and epidemiological information. The expected result is Negative.  Fact Sheet for Patients:  bloggercourse.com  Fact Sheet for Healthcare Providers:  seriousbroker.it  This test is no t yet approved or cleared by the [place]  FDA and  has been authorized for detection and/or diagnosis of SARS-CoV-2 by FDA under an Emergency Use Authorization (EUA). This EUA will remain  in effect (meaning this test can be used) for the duration of the COVID-19 declaration under Section 564(b)(1) of the Act, 21 U.S.C.section 360bbb-3(b)(1), unless the authorization is terminated  or revoked sooner.       Influenza A by PCR NEGATIVE NEGATIVE Final   Influenza B by PCR NEGATIVE NEGATIVE Final    Comment: (NOTE) The Xpert Xpress SARS-CoV-2/FLU/RSV plus assay is intended as an aid in the diagnosis of influenza from Nasopharyngeal swab specimens and should not be used as a sole basis for treatment. Nasal washings and aspirates are unacceptable for Xpert Xpress SARS-CoV-2/FLU/RSV testing.  Fact Sheet for Patients: bloggercourse.com  Fact Sheet for Healthcare  Providers: seriousbroker.it  This test is not yet approved or cleared by the [place]  FDA and has been authorized for detection and/or diagnosis of SARS-CoV-2 by FDA under an Emergency Use Authorization (EUA). This EUA will remain in effect (meaning this test can be used) for the duration of the COVID-19 declaration under Section 564(b)(1) of the Act, 21 U.S.C. section 360bbb-3(b)(1), unless the authorization is terminated or revoked.     Resp Syncytial Virus by PCR NEGATIVE NEGATIVE Final    Comment: (NOTE) Fact Sheet for Patients: bloggercourse.com  Fact Sheet for Healthcare Providers: seriousbroker.it  This test is not yet approved or cleared by the [place]  FDA and has been authorized for detection and/or diagnosis of SARS-CoV-2 by FDA under an Emergency Use Authorization (EUA). This EUA will remain in effect (meaning this test can be used) for the duration of the COVID-19 declaration under Section 564(b)(1) of the Act, 21 U.S.C. section 360bbb-3(b)(1), unless the authorization is terminated or revoked.  Performed at [facility], [address]  [place], [place] [address]   Blood culture (routine x 2)     Status: None   Collection Time: 03/24/24  1:47 AM   Specimen: BLOOD LEFT ARM  Result Value Ref Range Status   Specimen Description BLOOD LEFT ARM  Final   Special Requests   Final    BOTTLES DRAWN AEROBIC AND ANAEROBIC Blood Culture results may not be optimal due to an inadequate volume of blood received in culture bottles   Culture   Final    NO GROWTH 5 DAYS Performed at [facility], [address] [place], [place] [address]    Report Status 03/29/2024 FINAL  Final  Blood culture (routine x 2)     Status: None   Collection Time: 03/24/24  1:52 AM   Specimen: BLOOD RIGHT ARM  Result Value Ref Range Status   Specimen Description BLOOD RIGHT ARM  Final    Special Requests   Final    BOTTLES DRAWN AEROBIC AND ANAEROBIC Blood Culture results may not be optimal due to an inadequate volume of blood received in culture bottles   Culture   Final    NO GROWTH 5 DAYS Performed at [facility], [address] [place], [place] [address]    Report Status 03/29/2024 FINAL  Final  Culture, Urine (Do not remove urinary catheter, catheter placed by urology or difficult to place)     Status: Abnormal   Collection Time: 03/24/24  1:55 AM   Specimen: Urine, Catheterized  Result Value Ref Range Status   Specimen Description   Final    URINE, CATHETERIZED Performed at [facility], [address] [place], [place] [address]    Special Requests   Final    NONE Performed at [facility], [address] [place], [place] [address]    Culture MULTIPLE SPECIES PRESENT, SUGGEST RECOLLECTION (A)  Final   Report Status 03/25/2024 FINAL  Final  Respiratory (~20 pathogens) panel by PCR     Status: None   Collection Time: 03/24/24 11:17 AM   Specimen: Nasopharyngeal Swab; Respiratory  Result Value Ref Range Status   Adenovirus NOT DETECTED NOT DETECTED Final   Coronavirus 229E NOT DETECTED NOT DETECTED Final    Comment: (NOTE) The Coronavirus on the Respiratory Panel, DOES NOT test for the novel  Coronavirus (2019 nCoV)    Coronavirus HKU1 NOT DETECTED NOT DETECTED Final   Coronavirus NL63 NOT DETECTED NOT DETECTED Final   Coronavirus OC43 NOT DETECTED NOT DETECTED Final   Metapneumovirus NOT DETECTED NOT DETECTED Final   Rhinovirus / Enterovirus NOT DETECTED NOT DETECTED Final   Influenza A NOT DETECTED NOT DETECTED Final   Influenza B NOT DETECTED NOT DETECTED Final   Parainfluenza Virus 1 NOT DETECTED NOT DETECTED Final   Parainfluenza Virus 2 NOT DETECTED NOT DETECTED Final   Parainfluenza Virus 3 NOT DETECTED NOT DETECTED Final   Parainfluenza Virus 4 NOT DETECTED NOT DETECTED Final   Respiratory Syncytial Virus NOT  DETECTED NOT DETECTED Final   Bordetella pertussis NOT DETECTED NOT DETECTED Final   Bordetella Parapertussis NOT DETECTED NOT DETECTED Final   Chlamydophila pneumoniae NOT DETECTED NOT DETECTED Final   Mycoplasma pneumoniae NOT DETECTED NOT DETECTED Final    Comment: Performed at [facility], [address], [place], [place] [address]  MRSA Next Gen by PCR, Nasal     Status: None   Collection Time: 03/25/24 12:45 PM   Specimen: Nasal Mucosa; Nasal Swab  Result Value Ref Range  Status   MRSA by PCR Next Gen NOT DETECTED NOT DETECTED Final    Comment: (NOTE) The GeneXpert MRSA Assay (FDA approved for NASAL specimens only), is one component of a comprehensive MRSA colonization surveillance program. It is not intended to diagnose MRSA infection nor to guide or monitor treatment for MRSA infections. Test performance is not FDA approved in patients less than 9 years old. Performed at [facility], [address] [place], [place] [address]   Resp panel by RT-PCR (RSV, Flu A&B, Covid) Anterior Nasal Swab     Status: None   Collection Time: 03/29/24  4:45 PM   Specimen: Anterior Nasal Swab  Result Value Ref Range Status   SARS Coronavirus 2 by RT PCR NEGATIVE NEGATIVE Final    Comment: (NOTE) SARS-CoV-2 target nucleic acids are NOT DETECTED.  The SARS-CoV-2 RNA is generally detectable in upper respiratory specimens during the acute phase of infection. The lowest concentration of SARS-CoV-2 viral copies this assay can detect is 138 copies/mL. A negative result does not preclude SARS-Cov-2 infection and should not be used as the sole basis for treatment or other patient management decisions. A negative result may occur with  improper specimen collection/handling, submission of specimen other than nasopharyngeal swab, presence of viral mutation(s) within the areas targeted by this assay, and inadequate number of viral copies(<138 copies/mL). A negative result must be  combined with clinical observations, patient history, and epidemiological information. The expected result is Negative.  Fact Sheet for Patients:  bloggercourse.com  Fact Sheet for Healthcare Providers:  seriousbroker.it  This test is no t yet approved or cleared by the [place]  FDA and  has been authorized for detection and/or diagnosis of SARS-CoV-2 by FDA under an Emergency Use Authorization (EUA). This EUA will remain  in effect (meaning this test can be used) for the duration of the COVID-19 declaration under Section 564(b)(1) of the Act, 21 U.S.C.section 360bbb-3(b)(1), unless the authorization is terminated  or revoked sooner.       Influenza A by PCR NEGATIVE NEGATIVE Final   Influenza B by PCR NEGATIVE NEGATIVE Final    Comment: (NOTE) The Xpert Xpress SARS-CoV-2/FLU/RSV plus assay is intended as an aid in the diagnosis of influenza from Nasopharyngeal swab specimens and should not be used as a sole basis for treatment. Nasal washings and aspirates are unacceptable for Xpert Xpress SARS-CoV-2/FLU/RSV testing.  Fact Sheet for Patients: bloggercourse.com  Fact Sheet for Healthcare Providers: seriousbroker.it  This test is not yet approved or cleared by the [place]  FDA and has been authorized for detection and/or diagnosis of SARS-CoV-2 by FDA under an Emergency Use Authorization (EUA). This EUA will remain in effect (meaning this test can be used) for the duration of the COVID-19 declaration under Section 564(b)(1) of the Act, 21 U.S.C. section 360bbb-3(b)(1), unless the authorization is terminated or revoked.     Resp Syncytial Virus by PCR NEGATIVE NEGATIVE Final    Comment: (NOTE) Fact Sheet for Patients: bloggercourse.com  Fact Sheet for Healthcare Providers: seriousbroker.it  This test is not yet  approved or cleared by the [place]  FDA and has been authorized for detection and/or diagnosis of SARS-CoV-2 by FDA under an Emergency Use Authorization (EUA). This EUA will remain in effect (meaning this test can be used) for the duration of the COVID-19 declaration under Section 564(b)(1) of the Act, 21 U.S.C. section 360bbb-3(b)(1), unless the authorization is terminated or revoked.  Performed at [facility], [address] [place], [place] [address]  Culture, blood (Routine X 2) w Reflex to ID Panel     Status: None (Preliminary result)   Collection Time: 03/29/24  6:23 PM   Specimen: BLOOD  Result Value Ref Range Status   Specimen Description BLOOD BLOOD RIGHT HAND  Final   Special Requests   Final    BOTTLES DRAWN AEROBIC AND ANAEROBIC Blood Culture results may not be optimal due to an inadequate volume of blood received in culture bottles   Culture   Final    NO GROWTH < 12 HOURS Performed at [facility], [address] [place], [place] [address]    Report Status PENDING  Incomplete  Culture, blood (Routine X 2) w Reflex to ID Panel     Status: None (Preliminary result)   Collection Time: 03/29/24 11:16 PM   Specimen: BLOOD RIGHT HAND  Result Value Ref Range Status   Specimen Description BLOOD RIGHT HAND  Final   Special Requests   Final    BOTTLES DRAWN AEROBIC ONLY Blood Culture adequate volume   Culture   Final    NO GROWTH < 12 HOURS Performed at [facility], [address], [place], [place] [address]    Report Status PENDING  Incomplete     Total time spend on discharging this patient, including the last patient exam, discussing the hospital stay, instructions for ongoing care as it relates to all pertinent caregivers, as well as preparing the medical discharge records, prescriptions, and/or referrals as applicable, is 45 minutes.    Ellouise Haber, MD  [facility] 03/30/2024, 9:01 PM       [1] No Known  Allergies  "

## 2024-03-31 ENCOUNTER — Inpatient Hospital Stay (HOSPITAL_COMMUNITY)
Admission: EM | Admit: 2024-03-31 | Discharge: 2024-04-23 | DRG: 100 | Disposition: A | Discharge status: Skilled Nursing Facility | Source: Other Acute Inpatient Hospital | Attending: Internal Medicine | Admitting: Internal Medicine

## 2024-03-31 ENCOUNTER — Inpatient Hospital Stay

## 2024-03-31 DIAGNOSIS — K219 Gastro-esophageal reflux disease without esophagitis: Secondary | ICD-10-CM | POA: Diagnosis present

## 2024-03-31 DIAGNOSIS — Z515 Encounter for palliative care: Secondary | ICD-10-CM | POA: Diagnosis not present

## 2024-03-31 DIAGNOSIS — N390 Urinary tract infection, site not specified: Secondary | ICD-10-CM | POA: Diagnosis present

## 2024-03-31 DIAGNOSIS — G40909 Epilepsy, unspecified, not intractable, without status epilepticus: Secondary | ICD-10-CM | POA: Diagnosis present

## 2024-03-31 DIAGNOSIS — G40919 Epilepsy, unspecified, intractable, without status epilepticus: Secondary | ICD-10-CM | POA: Diagnosis not present

## 2024-03-31 DIAGNOSIS — E860 Dehydration: Secondary | ICD-10-CM | POA: Diagnosis present

## 2024-03-31 DIAGNOSIS — R627 Adult failure to thrive: Secondary | ICD-10-CM | POA: Diagnosis present

## 2024-03-31 DIAGNOSIS — K589 Irritable bowel syndrome without diarrhea: Secondary | ICD-10-CM | POA: Diagnosis present

## 2024-03-31 DIAGNOSIS — J690 Pneumonitis due to inhalation of food and vomit: Secondary | ICD-10-CM | POA: Diagnosis present

## 2024-03-31 DIAGNOSIS — F03C0 Unspecified dementia, severe, without behavioral disturbance, psychotic disturbance, mood disturbance, and anxiety: Secondary | ICD-10-CM | POA: Diagnosis not present

## 2024-03-31 DIAGNOSIS — Z5941 Food insecurity: Secondary | ICD-10-CM

## 2024-03-31 DIAGNOSIS — M7989 Other specified soft tissue disorders: Secondary | ICD-10-CM | POA: Diagnosis present

## 2024-03-31 DIAGNOSIS — Z79899 Other long term (current) drug therapy: Secondary | ICD-10-CM

## 2024-03-31 DIAGNOSIS — R509 Fever, unspecified: Secondary | ICD-10-CM

## 2024-03-31 DIAGNOSIS — Z6826 Body mass index (BMI) 26.0-26.9, adult: Secondary | ICD-10-CM

## 2024-03-31 DIAGNOSIS — F0390 Unspecified dementia without behavioral disturbance: Secondary | ICD-10-CM | POA: Diagnosis not present

## 2024-03-31 DIAGNOSIS — N179 Acute kidney failure, unspecified: Secondary | ICD-10-CM | POA: Diagnosis present

## 2024-03-31 DIAGNOSIS — R609 Edema, unspecified: Secondary | ICD-10-CM | POA: Diagnosis not present

## 2024-03-31 DIAGNOSIS — Z66 Do not resuscitate: Secondary | ICD-10-CM | POA: Diagnosis present

## 2024-03-31 DIAGNOSIS — Z7401 Bed confinement status: Secondary | ICD-10-CM

## 2024-03-31 DIAGNOSIS — G40802 Other epilepsy, not intractable, without status epilepticus: Secondary | ICD-10-CM | POA: Diagnosis not present

## 2024-03-31 DIAGNOSIS — E870 Hyperosmolality and hypernatremia: Secondary | ICD-10-CM | POA: Diagnosis present

## 2024-03-31 DIAGNOSIS — R569 Unspecified convulsions: Secondary | ICD-10-CM | POA: Diagnosis present

## 2024-03-31 DIAGNOSIS — E876 Hypokalemia: Secondary | ICD-10-CM | POA: Diagnosis present

## 2024-03-31 DIAGNOSIS — E46 Unspecified protein-calorie malnutrition: Secondary | ICD-10-CM | POA: Diagnosis present

## 2024-03-31 DIAGNOSIS — F32A Depression, unspecified: Secondary | ICD-10-CM | POA: Diagnosis present

## 2024-03-31 DIAGNOSIS — Z96643 Presence of artificial hip joint, bilateral: Secondary | ICD-10-CM | POA: Diagnosis present

## 2024-03-31 DIAGNOSIS — M21371 Foot drop, right foot: Secondary | ICD-10-CM | POA: Diagnosis present

## 2024-03-31 DIAGNOSIS — F01B3 Vascular dementia, moderate, with mood disturbance: Secondary | ICD-10-CM | POA: Diagnosis present

## 2024-03-31 DIAGNOSIS — E7220 Disorder of urea cycle metabolism, unspecified: Secondary | ICD-10-CM | POA: Diagnosis present

## 2024-03-31 DIAGNOSIS — R1310 Dysphagia, unspecified: Secondary | ICD-10-CM | POA: Diagnosis present

## 2024-03-31 DIAGNOSIS — L89302 Pressure ulcer of unspecified buttock, stage 2: Secondary | ICD-10-CM | POA: Diagnosis present

## 2024-03-31 DIAGNOSIS — Z7189 Other specified counseling: Secondary | ICD-10-CM | POA: Diagnosis not present

## 2024-03-31 DIAGNOSIS — F0150 Vascular dementia without behavioral disturbance: Secondary | ICD-10-CM | POA: Diagnosis not present

## 2024-03-31 DIAGNOSIS — G9341 Metabolic encephalopathy: Secondary | ICD-10-CM | POA: Diagnosis present

## 2024-03-31 DIAGNOSIS — I10 Essential (primary) hypertension: Secondary | ICD-10-CM | POA: Diagnosis present

## 2024-03-31 DIAGNOSIS — F39 Unspecified mood [affective] disorder: Secondary | ICD-10-CM | POA: Diagnosis not present

## 2024-03-31 LAB — MENINGITIS/ENCEPHALITIS PANEL (CSF)

## 2024-03-31 LAB — PROTEIN AND GLUCOSE, CSF
Glucose, CSF: 58 mg/dL (ref 40–70)
Total  Protein, CSF: 25 mg/dL (ref 15–45)

## 2024-03-31 LAB — RESPIRATORY PANEL BY PCR

## 2024-03-31 LAB — BASIC METABOLIC PANEL WITH GFR
Anion gap: 12 (ref 5–15)
BUN: 25 mg/dL — ABNORMAL HIGH (ref 8–23)
CO2: 24 mmol/L (ref 22–32)
Calcium: 9.4 mg/dL (ref 8.9–10.3)
Chloride: 112 mmol/L — ABNORMAL HIGH (ref 98–111)
Creatinine, Ser: 1.17 mg/dL — ABNORMAL HIGH (ref 0.44–1.00)
GFR, Estimated: 48 mL/min — ABNORMAL LOW
Glucose, Bld: 103 mg/dL — ABNORMAL HIGH (ref 70–99)
Potassium: 3.8 mmol/L (ref 3.5–5.1)
Sodium: 148 mmol/L — ABNORMAL HIGH (ref 135–145)

## 2024-03-31 LAB — CSF CELL COUNT WITH DIFFERENTIAL
RBC Count, CSF: 20 /mm3 — ABNORMAL HIGH (ref 0–3)
Tube #: 4
WBC, CSF: 3 /mm3 (ref 0–5)

## 2024-03-31 MED ORDER — VANCOMYCIN HCL 500 MG/100ML IV SOLN
500.0000 mg | Freq: Two times a day (BID) | INTRAVENOUS | Status: DC
  Administered 2024-03-31: 500 mg via INTRAVENOUS
  Filled 2024-03-31 (×2): qty 100

## 2024-03-31 MED ORDER — DEXTROSE 5 % IV SOLN
10.0000 mg/kg | Freq: Two times a day (BID) | INTRAVENOUS | Status: DC
  Filled 2024-03-31: qty 13.8

## 2024-03-31 MED ORDER — VANCOMYCIN HCL 500 MG/100ML IV SOLN: 500.0000 mg | Freq: Two times a day (BID) | INTRAVENOUS | Status: DC

## 2024-03-31 MED ORDER — LIDOCAINE HCL (PF) 1 % IJ SOLN
5.0000 mL | Freq: Once | INTRAMUSCULAR | Status: AC
  Administered 2024-03-31: 5 mL via INTRADERMAL

## 2024-03-31 MED ORDER — VALPROATE SODIUM 100 MG/ML IV SOLN
250.0000 mg | Freq: Three times a day (TID) | INTRAVENOUS | Status: DC
  Administered 2024-03-31: 250 mg via INTRAVENOUS
  Filled 2024-03-31 (×2): qty 2.5

## 2024-03-31 MED ORDER — SODIUM CHLORIDE 0.9 % IV SOLN
2.0000 g | Freq: Four times a day (QID) | INTRAVENOUS | Status: DC
  Filled 2024-03-31 (×2): qty 2000

## 2024-03-31 MED ORDER — ORAL CARE MOUTH RINSE: 15.0000 mL | OROMUCOSAL | Status: DC | PRN

## 2024-03-31 MED ORDER — LIDOCAINE 1 % OPTIME INJ - NO CHARGE
10.0000 mL | Freq: Once | INTRAMUSCULAR | Status: AC
  Administered 2024-03-31: 10 mL via INTRADERMAL
  Filled 2024-03-31: qty 10

## 2024-03-31 MED ORDER — ORAL CARE MOUTH RINSE
15.0000 mL | OROMUCOSAL | Status: DC
  Administered 2024-04-01 – 2024-04-23 (×82): 15 mL via OROMUCOSAL

## 2024-03-31 MED ORDER — VALPROATE SODIUM 100 MG/ML IV SOLN: 250.0000 mg | Freq: Three times a day (TID) | INTRAVENOUS | Status: DC

## 2024-03-31 NOTE — Plan of Care (Signed)
 Patient noted to exhibit increased drowsy/sleepy  affect.  Opens eyes with name being called, and/or slight tactile maneuvering.  Patient noted to require new PIV to (R) FA due to noted infiltration of current PIV to (L) arm device.  Patient continues to receive IV antibiotics per MD order.  No additional medical interventions required at this time.  Patient to continue to be monitored by hospital staff.

## 2024-03-31 NOTE — Progress Notes (Signed)
 Pt with decreased LOC.  She only responds to physical stimuli.  Unable to administer PO meds. Dr. Awanda made aware and advised OK to hold PO meds.

## 2024-03-31 NOTE — Plan of Care (Signed)

## 2024-03-31 NOTE — TOC Progression Note (Addendum)
 Transition of Care [facility]) - Progression Note    Patient Details  Name: Alicia Brennan MRN: [id number] Date of Birth: 11-26-1950  [facility] CM/SW Contact  Dalia GORMAN Fuse, RN Phone Number: 03/31/2024, 2:27 PM  Clinical Narrative:     TOC visited the patient's room and the patient was off the floor.  Patient on IV abx for sepsis and recs are to transfer to [facility] for continuous EEG monitoring and LP to r/o Meningistis. Per nurse note the patient is only responding to painful stimuli  Patient with bed offers from [facility] and [facility] in [place].  TOC placed call to the patient's daughter Judson [phone] and lvmm requesting a callback.  1522: TOC received a call from [place]. She explained that they brought the patient in for the hospital for seizures, not a UTI. She advised that her mom isn't doing well and she likely won't need a discharge plan.  TOC will continue to follow for discharge planning.  Expected Discharge Plan: Skilled Nursing Facility Barriers to Discharge: Continued Medical Work up               Expected Discharge Plan and Services   Discharge Planning Services: CM Consult Post Acute Care Choice: Skilled Nursing Facility Living arrangements for the past 2 months: Skilled Nursing Facility Expected Discharge Date: 03/30/24               DME Arranged: N/A         HH Arranged: NA           Social Drivers of Health (SDOH) Interventions SDOH Screenings   Food Insecurity: Food Insecurity Present (01/31/2024)  Housing: Low Risk (01/31/2024)  Transportation Needs: No Transportation Needs (01/31/2024)  Utilities: Not At Risk (01/31/2024)  Financial Resource Strain: Low Risk (11/16/2023)   Received from [facility]  Social Connections: Moderately Isolated (01/31/2024)  Tobacco Use: Low Risk (03/26/2024)    Readmission Risk Interventions     No data to display

## 2024-03-31 NOTE — Progress Notes (Signed)
 OT Cancellation Note  Patient Details Name: Alicia Brennan MRN: [id number] DOB: [birth year]   Cancelled Treatment:    Reason Eval/Treat Not Completed: Medical issues which prohibited therapy. Pt with decreased LOC. Per chart, MD recommending transfer to [place] for continuous EEG monitoring and need to rule out meningitis. Will hold therapy at this time and continue to follow acutely for appropriateness.   Xiamara Hulet R., MPH, MS, OTR/L ascom [phone] 03/31/2024, 3:56 PM

## 2024-03-31 NOTE — Progress Notes (Signed)
 Pt's daughter [name] called and notified of pt's transport to [facility]. All belongings taken with pt.

## 2024-03-31 NOTE — H&P (Addendum)
 " History and Physical    Patient: Alicia Brennan [name] DOB: 12-10-1950 DOA: 03/31/2024 DOS: the patient was seen and examined on 03/31/2024 PCP: [name], Edra GRADE, MD  Patient coming from: Transfer from [place]  Chief Complaint: Concern for breakthrough seizure  HPI: Alicia Brennan is a 74 y.o. 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  Workup showed bacteriuria thought was possibly due to colonization and she was treated with ceftriaxone  for 7 days.  She developed acute metabolic encephalopathy which may be related to underlying dementia.  She was treated with Vimpat , Depakote  and Keppra  for seizures.  Patient was going to be discharged yesterday (1/4) when she spiked fever and developed shaking episodes yesterday in the evening.  Family was concerned and requested for patient to be transferred to [facility] for further workup for seizure. At bedside, patient could not speak much, but was alert and oriented to person, place and situation, but not to time.  Patient is hemodynamically stable.  BMP today showed sodium of 148, chloride 112, BUN/creatinine 25/1.17 (baseline creatinine at 0.6-0.8).  Meningitis/encephalitis panel was negative.  CSF was normal except for red blood cell count of 20.    Review of Systems: As mentioned in the history of present illness. All other systems reviewed and are negative. 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   Foley catheter in place    Hypertension    IBS (irritable bowel syndrome)    Leg fracture, left    Lymphedema    Mood disturbance    Recurrent headache    Sarcoidosis of lung    Seizures (HCC)    Thrombocytopenia    Vascular dementia (HCC) 01/31/2024   Wound of buttock     Right, pressure wound   Past Surgical History:  Procedure Laterality Date   BILATERAL ANTERIOR TOTAL HIP ARTHROPLASTY Bilateral 11/15/2015   Procedure: BILATERAL ANTERIOR TOTAL HIP ARTHROPLASTY;  Surgeon: Redell Shoals, MD;  Location: [facility] OR;  Service: Orthopedics;  Laterality: Bilateral;   CARPAL TUNNEL RELEASE Bilateral    CHOLECYSTECTOMY     COLONOSCOPY     CYSTOSCOPY W/ URETERAL STENT PLACEMENT Left 01/03/2024   Procedure: CYSTOSCOPY, WITH RETROGRADE PYELOGRAM AND URETERAL STENT INSERTION;  Surgeon: Devere Lonni Righter, MD;  Location: [facility] ORS;  Service: Urology;  Laterality: Left;   CYSTOSCOPY/URETEROSCOPY/HOLMIUM LASER/STENT PLACEMENT Left 02/13/2024   Procedure: CYSTOSCOPY/URETEROSCOPY/HOLMIUM LASER/STENT PLACEMENT;  Surgeon: Devere Lonni Righter, MD;  Location: [facility] ORS;  Service: Urology;  Laterality: Left;  CYSTOSCOPY/LEFT URETEROSCOPY/HOLMIUM LASER/STENT PLACEMENT   EYE SURGERY     r/t sarcoidisis   ORIF ELBOW FRACTURE Left    TIBIA FRACTURE SURGERY Left    Social History:  reports that she has never smoked. She has never used smokeless tobacco. She reports that she does not drink alcohol  and does not use drugs.  Allergies[1]  Family History  Problem Relation Age of Onset   Dementia Mother    High blood pressure Father    Dementia Maternal Aunt    Sarcoidosis Son    Seizures Neg Hx     Prior to Admission medications  Medication Sig Start Date End Date Taking? Authorizing Provider  acyclovir  690 mg in dextrose  5 % 100 mL Inject 690 mg into the vein every 8 (eight) hours. 03/30/24  Awanda City, MD  ampicillin  2 g in sodium chloride  0.9 % 100 mL Inject 2 g into the vein every 4 (four) hours. 03/30/24   Awanda City, MD  cefTRIAXone  2 g in sodium chloride  0.9 % 100 mL Inject 2 g into the vein every 12 (twelve) hours. 03/30/24   Awanda City, MD  divalproex  (DEPAKOTE ) 250 MG DR tablet Take 1 tablet (250 mg total) by mouth 3 (three) times daily. 03/25/18   Harris, Abigail, PA-C   feeding supplement (ENSURE PLUS HIGH PROTEIN) LIQD Take 237 mLs by mouth 2 (two) times daily between meals. 01/10/24   Lue Elsie BROCKS, MD  [Paused] furosemide  (LASIX ) 40 MG tablet Take 1 tablet (40 mg total) by mouth daily as needed. Wait to take this until your doctor or other care provider tells you to start again. 02/05/24   Laurence Locus, DO  [Paused] hydrALAZINE  (APRESOLINE ) 25 MG tablet Take 1 tablet (25 mg total) by mouth every 8 (eight) hours. Wait to take this until your doctor or other care provider tells you to start again. 12/11/23   Odell Celinda Balo, MD  lacosamide  (VIMPAT ) 50 MG TABS tablet Take 1 tablet (50 mg total) by mouth 2 (two) times daily. 01/10/24   Lue Elsie BROCKS, MD  lactulose  (CHRONULAC ) 10 GM/15ML solution Take 15 mLs (10 g total) by mouth daily. 12/11/23   Odell Celinda Balo, MD  [Paused] lamoTRIgine  (LAMICTAL ) 25 MG tablet Take 25 mg by mouth 2 (two) times daily. Wait to take this until your doctor or other care provider tells you to start again. 11/19/23 04/05/24  [provider]  levETIRAcetam  (KEPPRA ) 500 MG tablet Take 1 tablet (500 mg total) by mouth 2 (two) times daily. 12/11/23   Odell Celinda Balo, MD  [Paused] NAYZILAM  5 MG/0.1ML SOLN GIVE ONE SPRAY INTO 1 NOSTRIL. IF NO RESPONSE IN 10 MINUTES, MAY GIVE SECOND SPRAY IN OTHER NOSTRIL. DO NOT GIVE SECOND DOSE IF PATIENT HAS TROUBLE BREATHING OR EXCESSIVE SEDATION, MAX 2 DOSES/SEIZURE EPISODE, 1 EPISODE EVERY 3 DAYS OR 5 EPISODES/MONTH Nasal for 6 Days Wait to take this until your doctor or other care provider tells you to start again. 09/24/23   [provider]  pantoprazole  (PROTONIX ) 40 MG tablet Take 1 tablet (40 mg total) by mouth daily. 02/08/24 02/07/25  Regalado, Belkys A, MD  sertraline  (ZOLOFT ) 25 MG tablet Take 25 mg by mouth daily.    [provider]  sodium chloride  0.9 % infusion Inject 125 mLs into the vein continuous. 03/30/24   Awanda City, MD  [Paused] telmisartan  (MICARDIS) 80 MG tablet Take 80 mg by mouth daily. Wait to take this until your doctor or other care provider tells you to start again. 12/04/22   [provider]  valproate 250 mg in dextrose  5 % 50 mL Inject 250 mg into the vein every 8 (eight) hours. 03/31/24   Awanda City, MD  vancomycin  (VANCOREADY) 500 MG/100ML IVPB Inject 100 mLs (500 mg total) into the vein every 12 (twelve) hours. 04/01/24   Awanda City, MD    Physical Exam: Vitals:   03/31/24 2118  BP: (!) 122/58  Pulse: 87  Resp: 16  Temp: 98.5 F (36.9 C)  TempSrc: Oral  SpO2: 96%  Weight: 69 kg  Height: 5' 4 (1.626 m)   General: Elderly female. Awake and alert and oriented x3. Not in any acute distress.  HEENT: NCAT.  PERRLA. EOMI. Sclerae anicteric.  Moist mucosal membranes. Neck: Neck supple without lymphadenopathy. No  carotid bruits. No masses palpated.  Cardiovascular: Regular rate with normal S1-S2 sounds. No murmurs, rubs or gallops auscultated. No JVD.  Respiratory: Clear breath sounds.  No accessory muscle use. Abdomen: Soft, nontender, nondistended. Active bowel sounds. No masses or hepatosplenomegaly  Skin: No rashes, lesions, or ulcerations.  Dry, warm to touch. Musculoskeletal:  2+ dorsalis pedis and radial pulses. Good ROM.  No contractures  Psychiatric: Intact judgment and insight.  Mood appropriate to current condition. Neurologic: No focal neurological deficits. Strength is 4/5 in both upper extremities.    Assessment and Plan: Shaking spells with concern for breakthrough seizures Patient spiked fever yesterday and presented with shaking spell without any seizure activity.  But family was concerned and wanted patient to be transferred to [facility] for further workup for seizure. Neurologist (Dr. Michaela) was consulted by Dr. Awanda and patient was transferred for continuous EEG monitoring and need to rule out meningitis. LP done was only positive for RBC of 20 Continue empiric treatment for meningitis  with IV vancomycin , ceftriaxone , ampicillin , acyclovir   Fever Unknown etiology Repeated chest x-ray and respiratory panel at [facility] were negative Blood culture pending  Hypernatremia Continue IV hydration  Acute kidney injury Dehydration BUN/creatinine 25/1.17 (baseline creatinine at 0.6-0.8) Continue gentle hydration Renally adjust medications, avoid nephrotoxic agents/dehydration/hypotension  Acute metabolic encephalopathy Underlying dementia Nursing assistance with feeding  Essential hypertension Continue irbesartan , hydralazine    Seizure disorder Continue Keppra , Vimpat  and Depakote    Depression Continue Zoloft    GERD without esophagitis Continue PPI   Right-sided weakness This appears to be chronic, with associated right foot drop. Continue PT/OT eval and treat  Aspiration risk She failed bedside swallow test Patient will be placed n.p.o. Speech therapy eval in the morning  Advance Care Planning: Full code  Consults: Neurology  Family Communication: None at bedside  Severity of Illness: The appropriate patient status for this patient is INPATIENT. Inpatient status is judged to be reasonable and necessary in order to provide the required intensity of service to ensure the patient's safety. The patient's presenting symptoms, physical exam findings, and initial radiographic and laboratory data in the context of their chronic comorbidities is felt to place them at high risk for further clinical deterioration. Furthermore, it is not anticipated that the patient will be medically stable for discharge from the hospital within 2 midnights of admission.   * I certify that at the point of admission it is my clinical judgment that the patient will require inpatient hospital care spanning beyond 2 midnights from the point of admission due to high intensity of service, high risk for further deterioration and high frequency of surveillance required.*  Author: Tineshia Becraft, DO 03/31/2024 10:37 PM  For on call review [email or web address].      [1] No Known Allergies  "

## 2024-03-31 NOTE — Progress Notes (Addendum)
 Pharmacy Antibiotic Note  Alicia Brennan is a 74 y.o. female admitted on 03/23/2024 with altered mental status. PMH significant for osteoarthritis, essential hypertension, IBS, continue lymphedema, sarcoidosis, seizures and vascular dementia. Patient has worsening mental status and is febrile (Tmax 102.9) with WBC 4.6. Pharmacy has been consulted to start empiric meningitis coverage with vancomycin , ceftriaxone , ampicillin , and acyclovir .   Plan: Change vancomycin  to 500 mg IV every 12 hours per nomogram targeting trough 15-20 (wt 69.1 kg, eCrCl 58 ml/min) Vancomycin  levels at steady state or as clinically indicated Continue ceftriaxone  2g IV every 12 hours Continue ampicillin  2 g IV every 4 hours Continue acyclovir  690 mg IV every 8 hours Continue to monitor renal function, clinical status, culture data, and antibiotic LOT  Height: 5' 4 (162.6 cm) Weight: 69.1 kg (152 lb 5.4 oz) IBW/kg (Calculated) : 54.7  Temp (24hrs), Avg:100.3 F (37.9 C), Min:98 F (36.7 C), Max:102.9 F (39.4 C)  Recent Labs  Lab 03/24/24 1151 03/27/24 0510  WBC 5.9 4.6  CREATININE 0.79 0.60    Estimated Creatinine Clearance: 58 mL/min (by C-G formula based on SCr of 0.6 mg/dL).    Allergies[1]  Antimicrobials this admission: vancomycin  01/04 >>  Ampicillin  01/04 >>  Ceftriaxone  01/04 >> Acyclovir  01/04 >>  Dose adjustments this admission:  N/A  Microbiology results: 01/03 BCx: NGTD  Thank you for involving pharmacy in this patient's care.   Kayla Blew III, PharmD Clinical Pharmacist 03/31/2024 7:20 AM      [1] No Known Allergies

## 2024-04-01 ENCOUNTER — Inpatient Hospital Stay (HOSPITAL_COMMUNITY)

## 2024-04-01 ENCOUNTER — Encounter (HOSPITAL_COMMUNITY): Payer: Self-pay | Admitting: Internal Medicine

## 2024-04-01 ENCOUNTER — Other Ambulatory Visit: Payer: Self-pay

## 2024-04-01 DIAGNOSIS — G40919 Epilepsy, unspecified, intractable, without status epilepticus: Secondary | ICD-10-CM

## 2024-04-01 DIAGNOSIS — I10 Essential (primary) hypertension: Secondary | ICD-10-CM | POA: Diagnosis not present

## 2024-04-01 DIAGNOSIS — E860 Dehydration: Secondary | ICD-10-CM

## 2024-04-01 DIAGNOSIS — F39 Unspecified mood [affective] disorder: Secondary | ICD-10-CM

## 2024-04-01 DIAGNOSIS — K219 Gastro-esophageal reflux disease without esophagitis: Secondary | ICD-10-CM | POA: Diagnosis not present

## 2024-04-01 DIAGNOSIS — F0390 Unspecified dementia without behavioral disturbance: Secondary | ICD-10-CM | POA: Diagnosis not present

## 2024-04-01 DIAGNOSIS — G40802 Other epilepsy, not intractable, without status epilepticus: Secondary | ICD-10-CM

## 2024-04-01 DIAGNOSIS — E46 Unspecified protein-calorie malnutrition: Secondary | ICD-10-CM

## 2024-04-01 LAB — CBC
HCT: 37.7 % (ref 36.0–46.0)
Hemoglobin: 11.9 g/dL — ABNORMAL LOW (ref 12.0–15.0)
MCH: 30.5 pg (ref 26.0–34.0)
MCHC: 31.6 g/dL (ref 30.0–36.0)
MCV: 96.7 fL (ref 80.0–100.0)
Platelets: 201 K/uL (ref 150–400)
RBC: 3.9 MIL/uL (ref 3.87–5.11)
RDW: 14.6 % (ref 11.5–15.5)
WBC: 9.8 K/uL (ref 4.0–10.5)
nRBC: 0 % (ref 0.0–0.2)

## 2024-04-01 LAB — URINALYSIS, ROUTINE W REFLEX MICROSCOPIC
Bilirubin Urine: NEGATIVE
Glucose, UA: NEGATIVE mg/dL
Ketones, ur: NEGATIVE mg/dL
Nitrite: NEGATIVE
Protein, ur: 30 mg/dL — AB
Specific Gravity, Urine: 1.017 (ref 1.005–1.030)
WBC, UA: >50 WBC/hpf (ref 0–5)
pH: 6 (ref 5.0–8.0)

## 2024-04-01 LAB — TSH: TSH: 3.19 u[IU]/mL (ref 0.350–4.500)

## 2024-04-01 LAB — COMPREHENSIVE METABOLIC PANEL WITH GFR
ALT: 41 U/L (ref 0–44)
AST: 90 U/L — ABNORMAL HIGH (ref 15–41)
Albumin: 2.7 g/dL — ABNORMAL LOW (ref 3.5–5.0)
Alkaline Phosphatase: 80 U/L (ref 38–126)
Anion gap: 12 (ref 5–15)
BUN: 23 mg/dL (ref 8–23)
CO2: 22 mmol/L (ref 22–32)
Calcium: 9.5 mg/dL (ref 8.9–10.3)
Chloride: 112 mmol/L — ABNORMAL HIGH (ref 98–111)
Creatinine, Ser: 0.9 mg/dL (ref 0.44–1.00)
GFR, Estimated: >60 mL/min
Glucose, Bld: 81 mg/dL (ref 70–99)
Potassium: 3.4 mmol/L — ABNORMAL LOW (ref 3.5–5.1)
Sodium: 146 mmol/L — ABNORMAL HIGH (ref 135–145)
Total Bilirubin: 0.4 mg/dL (ref 0.0–1.2)
Total Protein: 7 g/dL (ref 6.5–8.1)

## 2024-04-01 LAB — PHOSPHORUS: Phosphorus: 3.1 mg/dL (ref 2.5–4.6)

## 2024-04-01 LAB — SEDIMENTATION RATE: Sed Rate: 97 mm/h — ABNORMAL HIGH (ref 0–22)

## 2024-04-01 LAB — MAGNESIUM: Magnesium: 2.2 mg/dL (ref 1.7–2.4)

## 2024-04-01 LAB — VITAMIN B12: Vitamin B-12: 703 pg/mL (ref 180–914)

## 2024-04-01 LAB — FOLATE: Folate: 10.9 ng/mL

## 2024-04-01 LAB — AMMONIA: Ammonia: 26 umol/L (ref 9–35)

## 2024-04-01 MED ORDER — PANTOPRAZOLE SODIUM 40 MG PO TBEC
40.0000 mg | DELAYED_RELEASE_TABLET | Freq: Every day | ORAL | Status: DC
  Administered 2024-04-01 – 2024-04-23 (×22): 40 mg via ORAL
  Filled 2024-04-01 (×22): qty 1

## 2024-04-01 MED ORDER — SODIUM CHLORIDE 0.9 % IV SOLN
2.0000 g | Freq: Two times a day (BID) | INTRAVENOUS | Status: DC
  Administered 2024-04-01: 2 g via INTRAVENOUS
  Filled 2024-04-01: qty 20

## 2024-04-01 MED ORDER — DEXTROSE 5 % IV SOLN
700.0000 mg | Freq: Two times a day (BID) | INTRAVENOUS | Status: DC
  Administered 2024-04-01: 700 mg via INTRAVENOUS
  Filled 2024-04-01 (×2): qty 14

## 2024-04-01 MED ORDER — SODIUM CHLORIDE 0.45 % IV SOLN: INTRAVENOUS | Status: AC

## 2024-04-01 MED ORDER — SODIUM CHLORIDE 0.9 % IV SOLN
2.0000 g | Freq: Four times a day (QID) | INTRAVENOUS | Status: DC
  Administered 2024-04-01: 2 g via INTRAVENOUS
  Filled 2024-04-01 (×2): qty 2000

## 2024-04-01 MED ORDER — SODIUM CHLORIDE 0.9 % IV SOLN: 2.0000 g | INTRAVENOUS | Status: DC

## 2024-04-01 MED ORDER — ENOXAPARIN SODIUM 40 MG/0.4ML IJ SOSY
40.0000 mg | PREFILLED_SYRINGE | INTRAMUSCULAR | Status: DC
  Administered 2024-04-01 – 2024-04-23 (×23): 40 mg via SUBCUTANEOUS
  Filled 2024-04-01 (×23): qty 0.4

## 2024-04-01 MED ORDER — SODIUM CHLORIDE 0.9 % IV SOLN: 2.0000 g | Freq: Two times a day (BID) | INTRAVENOUS | Status: DC

## 2024-04-01 MED ORDER — LACOSAMIDE 50 MG PO TABS
50.0000 mg | ORAL_TABLET | Freq: Two times a day (BID) | ORAL | Status: DC
  Administered 2024-04-01: 50 mg via ORAL
  Filled 2024-04-01 (×2): qty 1

## 2024-04-01 MED ORDER — LEVETIRACETAM (KEPPRA) 500 MG/5 ML ADULT IV PUSH
500.0000 mg | Freq: Two times a day (BID) | INTRAVENOUS | Status: DC
  Administered 2024-04-01 – 2024-04-03 (×4): 500 mg via INTRAVENOUS
  Filled 2024-04-01 (×4): qty 5

## 2024-04-01 MED ORDER — VALPROATE SODIUM 100 MG/ML IV SOLN
250.0000 mg | Freq: Three times a day (TID) | INTRAVENOUS | Status: DC
  Administered 2024-04-01 – 2024-04-03 (×7): 250 mg via INTRAVENOUS
  Filled 2024-04-01 (×4): qty 250
  Filled 2024-04-01 (×3): qty 2.5
  Filled 2024-04-01: qty 250
  Filled 2024-04-01 (×2): qty 2.5

## 2024-04-01 MED ORDER — VANCOMYCIN HCL IN DEXTROSE 1-5 GM/200ML-% IV SOLN: 1000.0000 mg | INTRAVENOUS | Status: DC

## 2024-04-01 MED ORDER — VANCOMYCIN VARIABLE DOSE PER UNSTABLE RENAL FUNCTION (PHARMACIST DOSING): Status: DC

## 2024-04-01 MED ORDER — ONDANSETRON HCL 4 MG/2ML IJ SOLN: 4.0000 mg | Freq: Four times a day (QID) | INTRAMUSCULAR | Status: DC | PRN

## 2024-04-01 MED ORDER — SERTRALINE HCL 25 MG PO TABS
25.0000 mg | ORAL_TABLET | Freq: Every day | ORAL | Status: DC
  Administered 2024-04-01 – 2024-04-23 (×22): 25 mg via ORAL
  Filled 2024-04-01 (×22): qty 1

## 2024-04-01 MED ORDER — LEVETIRACETAM 500 MG PO TABS
500.0000 mg | ORAL_TABLET | Freq: Two times a day (BID) | ORAL | Status: DC
  Administered 2024-04-01: 500 mg via ORAL
  Filled 2024-04-01 (×2): qty 1

## 2024-04-01 MED ORDER — POTASSIUM CHLORIDE 10 MEQ/100ML IV SOLN
10.0000 meq | INTRAVENOUS | Status: AC
  Administered 2024-04-01 (×2): 10 meq via INTRAVENOUS
  Filled 2024-04-01 (×2): qty 100

## 2024-04-01 MED ORDER — ACETAMINOPHEN 325 MG PO TABS
650.0000 mg | ORAL_TABLET | Freq: Four times a day (QID) | ORAL | Status: DC | PRN
  Administered 2024-04-06 – 2024-04-16 (×2): 650 mg via ORAL
  Filled 2024-04-01 (×2): qty 2

## 2024-04-01 MED ORDER — ONDANSETRON HCL 4 MG PO TABS: 4.0000 mg | ORAL_TABLET | Freq: Four times a day (QID) | ORAL | Status: DC | PRN

## 2024-04-01 MED ORDER — SODIUM CHLORIDE 0.9 % IV SOLN
3.0000 g | Freq: Four times a day (QID) | INTRAVENOUS | Status: DC
  Administered 2024-04-01 – 2024-04-03 (×8): 3 g via INTRAVENOUS
  Filled 2024-04-01 (×8): qty 8

## 2024-04-01 MED ORDER — SODIUM CHLORIDE 0.9 % IV SOLN
50.0000 mg | Freq: Two times a day (BID) | INTRAVENOUS | Status: DC
  Administered 2024-04-02 – 2024-04-03 (×4): 50 mg via INTRAVENOUS
  Filled 2024-04-01 (×5): qty 5

## 2024-04-01 MED ORDER — ACETAMINOPHEN 650 MG RE SUPP: 650.0000 mg | Freq: Four times a day (QID) | RECTAL | Status: DC | PRN

## 2024-04-01 NOTE — Progress Notes (Signed)
 " PROGRESS NOTE    Alicia Brennan  [name] DOB: June 30, 1950 DOA: 03/23/2024 PCP: [name], MD  113A/113A-AA  LOS: 7 days   Brief hospital course:   Assessment & Plan: [name] is a 74 y.o. African-American female with medical history significant for osteoarthritis, essential hypertension, IBS, continue lymphedema, sarcoidosis, seizures and vascular dementia, who presented to the emergency room with acute onset of altered mental status.  She has been looking at her daughter and SNF staff but has not been answering questions or following commands.  It is unclear when the last time she was at her normal baseline.    Recurrent fevers --unclear etiology.  Repeat CXR no acute finding.  Repeat resp panel neg.  Possible aspiration? --repeat blood cx neg so far. --start vanc, ceftriaxone , amp and acyclovir  for empiric coverage of meningitis.    Bacteruria  --urine cx grew multiple species, likely chronic colonization.  Completed 3 days of empiric ceftriaxone .  Hypoglycemia, resolved - likely contributing to her acute metabolic encephalopathy on presentation. --encourage oral intake  Acute metabolic encephalopathy Underlying dementia --nursing assistance with feeding --consider possible silent aspiration as a etiology of fever.   Hypokalemia --monitor and supplement PRN  Essential hypertension --BP varied.  Low this evening. --hold irbesartan  and hydralazine   Seizure disorder (HCC) --d/c'ed Lamictal , since no mention of this in most recent neuro note. --cont Keppra , Vimpat  and Depakote  as IV form since oral intake unreliable. --transfer to [facility] for continuous EEG monitoring, per daughter's request  Depression --cont Zoloft   GERD without esophagitis --cont PPI  Right-sided weakness --appears to be chronic, with associated right foot drop. --MRI brain neg for acute or previous strokes  Sepsis ruled out --no definite infectious source yet   DVT  prophylaxis: Lovenox  SQ Code Status: Full code  Family Communication: son and daughter both updated at bedside today at different times Level of care: Telemetry Dispo:   The patient is from: SNF rehab Anticipated d/c is to: [facility] Anticipated d/c date is: whenever [facility] accepts   Subjective and Interval History:  Son expressed concern for seizure earlier in the day, citing pt not talking.  Neuro therefore consulted.  Later in the evening, pt developed another fever.  Daughter was concerned that fever was a sign of pt having seizures.  I went to bedside to evaluate.  Pt was not interactive, kept her eyes closed, but did moan and protest when being turned.  Did not show signs of seizures except for not talking which was how pt has been like since presentation.    Daughter requested transfer to [facility] for continuous EEG monitoring.  Discussed with Dr. Michaela who approved of the transfer.  Pt was also started on empiric tx for meningitis.    Transfer request made for transfer to [facility].  Pt checked out to accepting hospitalist at [facility].     Objective: Vitals:   03/31/24 0435 03/31/24 0835 03/31/24 1235 03/31/24 1836  BP: (!) 124/51 (!) 112/55 108/60 (!) 129/59  Pulse: (!) 103 (!) 110 91 93  Resp:  19 19 18   Temp: 100.2 F (37.9 C) (!) 97.4 F (36.3 C) 98.1 F (36.7 C) 98.7 F (37.1 C)  TempSrc: Axillary Oral Axillary Oral  SpO2: 97% 94% 98% 98%  Weight:      Height:       No intake or output data in the 24 hours ending 04/06/24 2111   Filed Weights   03/24/24 0446  Weight: 69.1 kg    Examination:  Constitutional: NAD, not interactive, eyes kept closed, but aroused to being turned or moved. CV: No cyanosis.   RESP: normal respiratory effort SKIN: warm, dry   Data Reviewed: I have personally reviewed labs and imaging studies  Time spent: 100 minutes  Ellouise Haber, MD [facility] If 7PM-7AM, please contact night-coverage 04/06/2024, 9:11 PM  "

## 2024-04-01 NOTE — Evaluation (Signed)
 Physical Therapy Evaluation Patient Details Name: Alicia Brennan MRN: [id number] DOB: [birth year] Today's Date: 04/01/2024  History of Present Illness  74 yo F adm to 12/29 [facility] with AMS fevere concerns for aspiration pending d/c on 1/4 that was stopped due to seizure and fever. Family requested transfer to [facility] for seizures PMH pulmonary sarcoidosis, IBS, OA, HTN, dementia, R foot drop, chronic R side weakness and seizures.   Clinical Impression  Pt is currently mobilizing below her baseline due to weakness, fatigue, and possible tone. Pt is currently being monitored for seizure activity. Pt requires maxA for bed mobility and consistent trunk support sitting EOB due to posterior and R lateral trunk lean. Pt follows cues to attempt to pull self forward using HHA but is unable to maintain upright trunk position. STS completed using sara stedy and maxA to complete stance. Significant eccentric control required to lower safely to chair. Pt tolerates standing with support for short durations. Pt would benefit from continued PT services focused on bed mobility, seated balance, and transfers to progress functional mobility. Pt's PLOF unclear at this time, will continue to follow up regarding level of assist required at baseline.         If plan is discharge home, recommend the following: A lot of help with walking and/or transfers;A lot of help with bathing/dressing/bathroom;Assistance with cooking/housework;Assistance with feeding;Direct supervision/assist for medications management;Direct supervision/assist for financial management;Assist for transportation;Help with stairs or ramp for entrance;Supervision due to cognitive status   Can travel by private vehicle   No    Equipment Recommendations Rolling walker (2 wheels);BSC/3in1;Wheelchair (measurements PT);Wheelchair cushion (measurements PT)  Recommendations for Other Services       Functional Status Assessment Patient has had a recent decline in  their functional status and demonstrates the ability to make significant improvements in function in a reasonable and predictable amount of time.     Precautions / Restrictions Precautions Precautions: Fall Recall of Precautions/Restrictions: Impaired Precaution/Restrictions Comments: EEG Restrictions Weight Bearing Restrictions Per Provider Order: No      Mobility  Bed Mobility Overal bed mobility: Needs Assistance Bed Mobility: Supine to Sit     Supine to sit: +2 for physical assistance, Total assist, Max assist     General bed mobility comments: LE and trunk support required to complete bed mobility. Management of EEG lines. Assist with trunk control upon sitting EOB, posterior and mild R lean noted.    Transfers Overall transfer level: Needs assistance Equipment used: Ambulation equipment used Transfers: Sit to/from Stand, Bed to chair/wheelchair/BSC Sit to Stand: Max assist, +2 physical assistance           General transfer comment: Pt does well reaching for handlebar on stedy. Assist needed to initiate stance and achieve hip extension to place posterior platforms. Guarding for trunk control during transfer to chair. Significant assist for eccentric control upon sitting in chair. Transfer via Lift Equipment: Stedy  Ambulation/Gait                  Stairs            Wheelchair Mobility     Tilt Bed    Modified Rankin (Stroke Patients Only)       Balance Overall balance assessment: Needs assistance Sitting-balance support: Feet supported, No upper extremity supported Sitting balance-Leahy Scale: Poor Sitting balance - Comments: Hand held assist and cues to pull trunk forward. Posterior and R lean throughout. Postural control: Posterior lean, Right lateral lean Standing balance support: Bilateral upper extremity supported,  Reliant on assistive device for balance Standing balance-Leahy Scale: Zero Standing balance comment: Stands on stedy. B UE  assist and posterior platforms to maintain standing.                             Pertinent Vitals/Pain Pain Assessment Pain Assessment: No/denies pain    Home Living Family/patient expects to be discharged to:: Skilled nursing facility                   Additional Comments: Came from SNF?    Prior Function Prior Level of Function : Patient poor historian/Family not available;Needs assist       Physical Assist : Mobility (physical);ADLs (physical) Mobility (physical): Bed mobility;Transfers;Gait ADLs (physical): Grooming;Bathing;Dressing;Toileting;IADLs Mobility Comments: Pt reports transferring to wheelchair at facility with assist, when asked how far she was walking pt states so so. ADLs Comments: assist for all ADL/IADL at facility     Extremity/Trunk Assessment        Lower Extremity Assessment Lower Extremity Assessment: RLE deficits/detail;LLE deficits/detail;Generalized weakness RLE Deficits / Details: Appears sensitive to touch with sensation testing. Is able to wiggle toes but does not demonstrate dorsiflexion/plantarflexion. Muscle activation noted when cued to bend/straighten knee but no movement occurs. Tone vs tightness with passive knee flexion. R>L weakness noted with functional mobility. LLE Deficits / Details: Appears sensitive to touch with sensation testing. Is able to wiggle toes but does not demonstrate dorsiflexion/plantarflexion. Muscle activation noted when cued to bend/straighten knee but no movement occurs. Tone vs tightness with passive knee flexion.    Cervical / Trunk Assessment Cervical / Trunk Assessment: Kyphotic  Communication   Communication Communication: Impaired Factors Affecting Communication: Difficulty expressing self;Reduced clarity of speech    Cognition Arousal: Alert Behavior During Therapy: Flat affect   PT - Cognitive impairments: Difficult to assess Difficult to assess due to: Impaired communication                      PT - Cognition Comments: Attempts to answer questions, words are quiet and unclear. Pt unable to answer orientation questions. Following commands: Impaired Following commands impaired: Follows one step commands with increased time, Follows one step commands inconsistently     Cueing Cueing Techniques: Verbal cues, Gestural cues, Tactile cues, Visual cues     General Comments General comments (skin integrity, edema, etc.): VSS throughout. EEG intact. No significant skin abnormalities noted.    Exercises     Assessment/Plan    PT Assessment Patient needs continued PT services  PT Problem List Decreased strength;Decreased mobility;Decreased safety awareness;Impaired tone;Decreased range of motion;Decreased coordination;Decreased knowledge of precautions;Decreased activity tolerance;Decreased cognition;Decreased balance;Decreased knowledge of use of DME       PT Treatment Interventions DME instruction;Wheelchair mobility training;Therapeutic exercise;Gait training;Balance training;Stair training;Neuromuscular re-education;Functional mobility training;Therapeutic activities;Patient/family education    PT Goals (Current goals can be found in the Care Plan section)  Acute Rehab PT Goals Patient Stated Goal: None stated    Frequency Min 2X/week     Co-evaluation PT/OT/SLP Co-Evaluation/Treatment: Yes Reason for Co-Treatment: Complexity of the patient's impairments (multi-system involvement);Necessary to address cognition/behavior during functional activity;To address functional/ADL transfers PT goals addressed during session: Mobility/safety with mobility;Balance OT goals addressed during session: ADL's and self-care       AM-PAC PT 6 Clicks Mobility  Outcome Measure Help needed turning from your back to your side while in a flat bed without using bedrails?: Total Help needed moving from lying  on your back to sitting on the side of a flat bed without using  bedrails?: Total Help needed moving to and from a bed to a chair (including a wheelchair)?: Total Help needed standing up from a chair using your arms (e.g., wheelchair or bedside chair)?: Total Help needed to walk in hospital room?: Total Help needed climbing 3-5 steps with a railing? : Total 6 Click Score: 6    End of Session Equipment Utilized During Treatment: Gait belt Activity Tolerance: Patient limited by fatigue Patient left: in chair;with call bell/phone within reach;with chair alarm set Nurse Communication: Mobility status;Need for lift equipment PT Visit Diagnosis: Unsteadiness on feet (R26.81);Muscle weakness (generalized) (M62.81);Other abnormalities of gait and mobility (R26.89)    Time: 8899-8871 PT Time Calculation (min) (ACUTE ONLY): 28 min   Charges:   PT Evaluation $PT Eval High Complexity: 1 High PT Treatments $Therapeutic Activity: 8-22 mins PT General Charges $$ ACUTE PT VISIT: 1 Visit         Sabra Morel, PT, DPT  Acute Rehabilitation Services         Office: [phone]     Sabra MARLA Morel 04/01/2024, 3:56 PM

## 2024-04-01 NOTE — Progress Notes (Signed)
 PHARMACY NOTE:  ANTIMICROBIAL RENAL DOSAGE ADJUSTMENT  Current antimicrobial regimen includes a mismatch between antimicrobial dosage and estimated renal function.  As per policy approved by the Pharmacy & Therapeutics and Medical Executive Committees, the antimicrobial dosage will be adjusted accordingly.  Current antimicrobial dosage:    -Vancomycin  500mg  IV every 12 hours   Indication: Meningitis r/o  Renal Function:  Estimated Creatinine Clearance: 39.6 mL/min (A) (by C-G formula based on SCr of 1.17 mg/dL (H)). []      On intermittent HD, scheduled: []      On CRRT    Antimicrobial dosage has been changed to:   -Vancomycin  variable given sCr 0.6 >> 1.17  -Will continue to monitor and f/u with levels as needed -Last dose of vancomycin  given @1430  1/5   Additional comments:   Thank you for allowing pharmacy to be a part of this patient's care.  Lynwood Poplar, PharmD, BCPS Clinical Pharmacist 04/01/2024 3:08 AM

## 2024-04-01 NOTE — Progress Notes (Signed)
 LTM EEG hooked up and recording with CT compatible leads. Patient is being monitored by [facility] and test button was tested.

## 2024-04-01 NOTE — Progress Notes (Addendum)
 "                        PROGRESS NOTE        PATIENT DETAILS Name: Alicia Brennan Age: 74 y.o. Sex: female Date of Birth: 1950-09-02 Admit Date: 03/31/2024 Admitting Physician Posey Maier, DO ERE:Qzops Tor Edra GRADE, MD  Brief Summary: Patient is a 74 y.o.  female with history of dementia, seizure disorder-who was transferred from [facility] for LTM EEG.  Significant events: 12/28-01/5>> hospitalization at [facility] for altered mental status-fever at [facility] workup negative-transferred to [facility] for LTM EEG. 01/5>> admit to TRH at [facility].  Significant studies: 12/28>> CT head: No acute intracranial abnormality 12/29>> CXR: No PNA 12/31>> RUE Doppler: No DVT 01/01>> MRI brain: No acute intracranial abnormality 01/03>> CXR: Left lingula subsegmental atelectasis.  Significant microbiology data: 12/28>> COVID/influenza/RSV PCR: Negative 12/29>> blood culture: No growth 12/29>> urine culture: Multiple species 12/29>> respiratory virus panel: Negative 01/03>> blood culture: No growth 01/03>> COVID/influenza/RSV PCR: Negative 01/05>> respiratory virus panel: Negative 01/05>> CSF meningitis panel: Negative 01/05>> CSF culture: Pending  Procedures: 1/5>> fluoroscopy-guided LP  Consults: Neurology  Subjective: Awake-afebrile overnight-answers simple questions appropriately.  Objective: Vitals: Blood pressure (!) 122/58, pulse 87, temperature 98.5 F (36.9 C), temperature source Oral, resp. rate 16, height 5' 4 (1.626 m), weight 69 kg, SpO2 96%.   Exam: Gen Exam:Awake-not in any distress HEENT:atraumatic, normocephalic Chest: B/L clear to auscultation anteriorly CVS:S1S2 regular Abdomen:soft non tender, non distended Extremities:no edema Neurology: Appears to have generalized weakness-appears to have chronic rigidity-in some amount of intentional tremor at baseline.  Exam however is nonfocal. Skin: no rash  Pertinent Labs/Radiology:    Latest Ref Rng & Units 04/01/2024     2:57 AM 03/27/2024    5:10 AM 03/24/2024   11:51 AM  CBC  WBC 4.0 - 10.5 K/uL 9.8  4.6  5.9   Hemoglobin 12.0 - 15.0 g/dL 88.0  89.2  87.4   Hematocrit 36.0 - 46.0 % 37.7  33.4  39.5   Platelets 150 - 400 K/uL 201  181  210     Lab Results  Component Value Date   NA 146 (H) 04/01/2024   K 3.4 (L) 04/01/2024   CL 112 (H) 04/01/2024   CO2 22 04/01/2024      Assessment/Plan: Fever Had episodes of fever at [facility] on 1/3-1/5 Has had extensive workup-see above-no source apparent Although UA suggestive of UTI-urine cultures nondiagnostic I wonder if the fever was from aspiration pneumonitis in the setting of altered mental status Thankfully afebrile for more than 24 hours We will stop antibiotics to cover meningitis/encephalitis as CSF is benign-will switch her to IV Unasyn  for another 1-2 more days to complete a 7-day course. If fever reoccurs-needs CT imaging of chest/abdomen and then B/L Dopplers.  Acute metabolic encephalopathy Thought to be secondary to infectious disease in the setting of fever-but concerned that patient could be having nonconvulsive seizures-LTM EEG planned for today In the interim-continue with Keppra /Vimpat /Depakote .  Seizure disorder See above  Mood disorder Zoloft   GERD PPI  HTN BP stable Micardis on hold-resume when able.  Hypokalemia Replete/recheck  Hypernatremia Likely secondary to poor oral intake Start half-normal saline-recheck electrolytes tomorrow.  Dementia Delirium precautions  Pressure Ulcer: Agree with assessment as outlined below. Wound 03/24/24 1721 Pressure Injury Buttocks Medial Stage 2 -  Partial thickness loss of dermis presenting as a shallow open injury with a red, pink wound bed without slough. (Active)   Code status:  Code Status: Full Code   DVT Prophylaxis: enoxaparin  (LOVENOX ) injection 40 mg Start: 04/01/24 1000 SCDs Start: 04/01/24 0023   Family Communication: None at bedside   Disposition  Plan: Status is: Inpatient Remains inpatient appropriate because: Severity of illness   Planned Discharge Destination:Skilled nursing facility   Diet: Diet Order             Diet NPO time specified  Diet effective now                     Antimicrobial agents: Anti-infectives (From admission, onward)    Start     Dose/Rate Route Frequency Ordered Stop   04/01/24 1000  cefTRIAXone  (ROCEPHIN ) 2 g in sodium chloride  0.9 % 100 mL IVPB  Status:  Discontinued        2 g 200 mL/hr over 30 Minutes Intravenous Every 12 hours 04/01/24 0137 04/01/24 0152   04/01/24 1000  acyclovir  (ZOVIRAX ) 700 mg in dextrose  5 % 100 mL IVPB        700 mg 114 mL/hr over 60 Minutes Intravenous Every 12 hours 04/01/24 0238     04/01/24 1000  cefTRIAXone  (ROCEPHIN ) 2 g in sodium chloride  0.9 % 100 mL IVPB        2 g 200 mL/hr over 30 Minutes Intravenous Every 12 hours 04/01/24 0238     04/01/24 1000  vancomycin  (VANCOCIN ) IVPB 1000 mg/200 mL premix  Status:  Discontinued        1,000 mg 200 mL/hr over 60 Minutes Intravenous Every 24 hours 04/01/24 0238 04/01/24 0307   04/01/24 1000  cefTRIAXone  (ROCEPHIN ) 2 g in sodium chloride  0.9 % 100 mL IVPB  Status:  Discontinued        2 g 200 mL/hr over 30 Minutes Intravenous Every 24 hours 04/01/24 0152 04/01/24 0259   04/01/24 0600  ampicillin  (OMNIPEN) 2 g in sodium chloride  0.9 % 100 mL IVPB        2 g 300 mL/hr over 20 Minutes Intravenous Every 6 hours 04/01/24 0238     04/01/24 0307  vancomycin  variable dose per unstable renal function (pharmacist dosing)         Does not apply See admin instructions 04/01/24 0307          MEDICATIONS: Scheduled Meds:  enoxaparin  (LOVENOX ) injection  40 mg Subcutaneous Q24H   lacosamide   50 mg Oral BID   levETIRAcetam   500 mg Oral BID   mouth rinse  15 mL Mouth Rinse 4 times per day   pantoprazole   40 mg Oral Daily   sertraline   25 mg Oral Daily   vancomycin  variable dose per unstable renal function (pharmacist  dosing)   Does not apply See admin instructions   Continuous Infusions:  acyclovir  (ZOVIRAX ) 700 mg in dextrose  5 % 100 mL IVPB     ampicillin  (OMNIPEN) 2 g in sodium chloride  0.9 % 100 mL IVPB 2 g (04/01/24 0658)   cefTRIAXone  (ROCEPHIN ) 2 g in sodium chloride  0.9 % 100 mL IVPB 2 g (04/01/24 0914)   valproate (DEPACON ) 250 mg in dextrose  5 % 50 mL IVPB 52.5 mL/hr at 04/01/24 0616   PRN Meds:.acetaminophen  **OR** acetaminophen , ondansetron  **OR** ondansetron  (ZOFRAN ) IV, mouth rinse   I have personally reviewed following labs and imaging studies  LABORATORY DATA: CBC: Recent Labs  Lab 03/27/24 0510 04/01/24 0257  WBC 4.6 9.8  HGB 10.7* 11.9*  HCT 33.4* 37.7  MCV 93.6 96.7  PLT 181 201  Basic Metabolic Panel: Recent Labs  Lab 03/27/24 0510 03/31/24 0656 04/01/24 0257  NA 136 148* 146*  K 3.5 3.8 3.4*  CL 102 112* 112*  CO2 24 24 22   GLUCOSE 107* 103* 81  BUN 12 25* 23  CREATININE 0.60 1.17* 0.90  CALCIUM 8.8* 9.4 9.5  MG 1.9  --  2.2  PHOS  --   --  3.1    GFR: Estimated Creatinine Clearance: 51.5 mL/min (by C-G formula based on SCr of 0.9 mg/dL).  Liver Function Tests: Recent Labs  Lab 04/01/24 0257  AST 90*  ALT 41  ALKPHOS 80  BILITOT 0.4  PROT 7.0  ALBUMIN  2.7*   No results for input(s): LIPASE, AMYLASE in the last 168 hours. No results for input(s): AMMONIA in the last 168 hours.  Coagulation Profile: No results for input(s): INR, PROTIME in the last 168 hours.  Cardiac Enzymes: Recent Labs  Lab 03/30/24 2040  CKTOTAL 100    BNP (last 3 results) Recent Labs    12/06/23 1658  PROBNP 64.3    Lipid Profile: No results for input(s): CHOL, HDL, LDLCALC, TRIG, CHOLHDL, LDLDIRECT in the last 72 hours.  Thyroid Function Tests: No results for input(s): TSH, T4TOTAL, FREET4, T3FREE, THYROIDAB in the last 72 hours.  Anemia Panel: No results for input(s): VITAMINB12, FOLATE, FERRITIN, TIBC, IRON,  RETICCTPCT in the last 72 hours.  Urine analysis:    Component Value Date/Time   COLORURINE YELLOW (A) 03/24/2024 0155   APPEARANCEUR TURBID (A) 03/24/2024 0155   APPEARANCEUR Clear 05/17/2013 1525   LABSPEC 1.009 03/24/2024 0155   LABSPEC 1.019 05/17/2013 1525   PHURINE 6.0 03/24/2024 0155   GLUCOSEU NEGATIVE 03/24/2024 0155   GLUCOSEU Negative 05/17/2013 1525   HGBUR SMALL (A) 03/24/2024 0155   BILIRUBINUR NEGATIVE 03/24/2024 0155   BILIRUBINUR Negative 05/17/2013 1525   KETONESUR NEGATIVE 03/24/2024 0155   PROTEINUR 100 (A) 03/24/2024 0155   NITRITE NEGATIVE 03/24/2024 0155   LEUKOCYTESUR MODERATE (A) 03/24/2024 0155   LEUKOCYTESUR Trace 05/17/2013 1525    Sepsis Labs: Lactic Acid, Venous    Component Value Date/Time   LATICACIDVEN 1.0 03/24/2024 0147    MICROBIOLOGY: Recent Results (from the past 240 hours)  Resp panel by RT-PCR (RSV, Flu A&B, Covid) Anterior Nasal Swab     Status: None   Collection Time: 03/23/24  9:16 PM   Specimen: Anterior Nasal Swab  Result Value Ref Range Status   SARS Coronavirus 2 by RT PCR NEGATIVE NEGATIVE Final    Comment: (NOTE) SARS-CoV-2 target nucleic acids are NOT DETECTED.  The SARS-CoV-2 RNA is generally detectable in upper respiratory specimens during the acute phase of infection. The lowest concentration of SARS-CoV-2 viral copies this assay can detect is 138 copies/mL. A negative result does not preclude SARS-Cov-2 infection and should not be used as the sole basis for treatment or other patient management decisions. A negative result may occur with  improper specimen collection/handling, submission of specimen other than nasopharyngeal swab, presence of viral mutation(s) within the areas targeted by this assay, and inadequate number of viral copies(<138 copies/mL). A negative result must be combined with clinical observations, patient history, and epidemiological information. The expected result is Negative.  Fact Sheet  for Patients:  bloggercourse.com  Fact Sheet for Healthcare Providers:  seriousbroker.it  This test is no t yet approved or cleared by the [place]  FDA and  has been authorized for detection and/or diagnosis of SARS-CoV-2 by FDA under an Emergency Use Authorization (EUA). This  EUA will remain  in effect (meaning this test can be used) for the duration of the COVID-19 declaration under Section 564(b)(1) of the Act, 21 U.S.C.section 360bbb-3(b)(1), unless the authorization is terminated  or revoked sooner.       Influenza A by PCR NEGATIVE NEGATIVE Final   Influenza B by PCR NEGATIVE NEGATIVE Final    Comment: (NOTE) The Xpert Xpress SARS-CoV-2/FLU/RSV plus assay is intended as an aid in the diagnosis of influenza from Nasopharyngeal swab specimens and should not be used as a sole basis for treatment. Nasal washings and aspirates are unacceptable for Xpert Xpress SARS-CoV-2/FLU/RSV testing.  Fact Sheet for Patients: bloggercourse.com  Fact Sheet for Healthcare Providers: seriousbroker.it  This test is not yet approved or cleared by the [place]  FDA and has been authorized for detection and/or diagnosis of SARS-CoV-2 by FDA under an Emergency Use Authorization (EUA). This EUA will remain in effect (meaning this test can be used) for the duration of the COVID-19 declaration under Section 564(b)(1) of the Act, 21 U.S.C. section 360bbb-3(b)(1), unless the authorization is terminated or revoked.     Resp Syncytial Virus by PCR NEGATIVE NEGATIVE Final    Comment: (NOTE) Fact Sheet for Patients: bloggercourse.com  Fact Sheet for Healthcare Providers: seriousbroker.it  This test is not yet approved or cleared by the [place]  FDA and has been authorized for detection and/or diagnosis of SARS-CoV-2 by FDA under an  Emergency Use Authorization (EUA). This EUA will remain in effect (meaning this test can be used) for the duration of the COVID-19 declaration under Section 564(b)(1) of the Act, 21 U.S.C. section 360bbb-3(b)(1), unless the authorization is terminated or revoked.  Performed at [facility], [address] [place], [place] [address]   Blood culture (routine x 2)     Status: None   Collection Time: 03/24/24  1:47 AM   Specimen: BLOOD LEFT ARM  Result Value Ref Range Status   Specimen Description BLOOD LEFT ARM  Final   Special Requests   Final    BOTTLES DRAWN AEROBIC AND ANAEROBIC Blood Culture results may not be optimal due to an inadequate volume of blood received in culture bottles   Culture   Final    NO GROWTH 5 DAYS Performed at [facility], [address] [place], [place] [address]    Report Status 03/29/2024 FINAL  Final  Blood culture (routine x 2)     Status: None   Collection Time: 03/24/24  1:52 AM   Specimen: BLOOD RIGHT ARM  Result Value Ref Range Status   Specimen Description BLOOD RIGHT ARM  Final   Special Requests   Final    BOTTLES DRAWN AEROBIC AND ANAEROBIC Blood Culture results may not be optimal due to an inadequate volume of blood received in culture bottles   Culture   Final    NO GROWTH 5 DAYS Performed at [facility], [address] [place], [place] [address]    Report Status 03/29/2024 FINAL  Final  Culture, Urine (Do not remove urinary catheter, catheter placed by urology or difficult to place)     Status: Abnormal   Collection Time: 03/24/24  1:55 AM   Specimen: Urine, Catheterized  Result Value Ref Range Status   Specimen Description   Final    URINE, CATHETERIZED Performed at [facility], [address], [place], [place] [address]    Special Requests   Final    NONE Performed at [facility], [address] [place], [place] [address]    Culture MULTIPLE SPECIES PRESENT, SUGGEST  RECOLLECTION (A)  Final   Report Status 03/25/2024 FINAL  Final  Respiratory (~20 pathogens) panel by PCR     Status: None   Collection Time: 03/24/24 11:17 AM   Specimen: Nasopharyngeal Swab; Respiratory  Result Value Ref Range Status   Adenovirus NOT DETECTED NOT DETECTED Final   Coronavirus 229E NOT DETECTED NOT DETECTED Final    Comment: (NOTE) The Coronavirus on the Respiratory Panel, DOES NOT test for the novel  Coronavirus (2019 nCoV)    Coronavirus HKU1 NOT DETECTED NOT DETECTED Final   Coronavirus NL63 NOT DETECTED NOT DETECTED Final   Coronavirus OC43 NOT DETECTED NOT DETECTED Final   Metapneumovirus NOT DETECTED NOT DETECTED Final   Rhinovirus / Enterovirus NOT DETECTED NOT DETECTED Final   Influenza A NOT DETECTED NOT DETECTED Final   Influenza B NOT DETECTED NOT DETECTED Final   Parainfluenza Virus 1 NOT DETECTED NOT DETECTED Final   Parainfluenza Virus 2 NOT DETECTED NOT DETECTED Final   Parainfluenza Virus 3 NOT DETECTED NOT DETECTED Final   Parainfluenza Virus 4 NOT DETECTED NOT DETECTED Final   Respiratory Syncytial Virus NOT DETECTED NOT DETECTED Final   Bordetella pertussis NOT DETECTED NOT DETECTED Final   Bordetella Parapertussis NOT DETECTED NOT DETECTED Final   Chlamydophila pneumoniae NOT DETECTED NOT DETECTED Final   Mycoplasma pneumoniae NOT DETECTED NOT DETECTED Final    Comment: Performed at [facility], [address] [place], [place] [address]  MRSA Next Gen by PCR, Nasal     Status: None   Collection Time: 03/25/24 12:45 PM   Specimen: Nasal Mucosa; Nasal Swab  Result Value Ref Range Status   MRSA by PCR Next Gen NOT DETECTED NOT DETECTED Final    Comment: (NOTE) The GeneXpert MRSA Assay (FDA approved for NASAL specimens only), is one component of a comprehensive MRSA colonization surveillance program. It is not intended to diagnose MRSA infection nor to guide or monitor treatment for MRSA infections. Test performance is not FDA approved  in patients less than 19 years old. Performed at [facility], [address] [place], [place] [address]   Resp panel by RT-PCR (RSV, Flu A&B, Covid) Anterior Nasal Swab     Status: None   Collection Time: 03/29/24  4:45 PM   Specimen: Anterior Nasal Swab  Result Value Ref Range Status   SARS Coronavirus 2 by RT PCR NEGATIVE NEGATIVE Final    Comment: (NOTE) SARS-CoV-2 target nucleic acids are NOT DETECTED.  The SARS-CoV-2 RNA is generally detectable in upper respiratory specimens during the acute phase of infection. The lowest concentration of SARS-CoV-2 viral copies this assay can detect is 138 copies/mL. A negative result does not preclude SARS-Cov-2 infection and should not be used as the sole basis for treatment or other patient management decisions. A negative result may occur with  improper specimen collection/handling, submission of specimen other than nasopharyngeal swab, presence of viral mutation(s) within the areas targeted by this assay, and inadequate number of viral copies(<138 copies/mL). A negative result must be combined with clinical observations, patient history, and epidemiological information. The expected result is Negative.  Fact Sheet for Patients:  bloggercourse.com  Fact Sheet for Healthcare Providers:  seriousbroker.it  This test is no t yet approved or cleared by the [place]  FDA and  has been authorized for detection and/or diagnosis of SARS-CoV-2 by FDA under an Emergency Use Authorization (EUA). This EUA will remain  in effect (meaning this test can be used) for the duration of the COVID-19 declaration under Section 564(b)(1) of the Act, 21 U.S.C.section 360bbb-3(b)(1), unless the authorization is terminated  or revoked sooner.       Influenza A by PCR NEGATIVE NEGATIVE Final   Influenza B by PCR NEGATIVE NEGATIVE Final    Comment: (NOTE) The Xpert Xpress SARS-CoV-2/FLU/RSV  plus assay is intended as an aid in the diagnosis of influenza from Nasopharyngeal swab specimens and should not be used as a sole basis for treatment. Nasal washings and aspirates are unacceptable for Xpert Xpress SARS-CoV-2/FLU/RSV testing.  Fact Sheet for Patients: bloggercourse.com  Fact Sheet for Healthcare Providers: seriousbroker.it  This test is not yet approved or cleared by the [place]  FDA and has been authorized for detection and/or diagnosis of SARS-CoV-2 by FDA under an Emergency Use Authorization (EUA). This EUA will remain in effect (meaning this test can be used) for the duration of the COVID-19 declaration under Section 564(b)(1) of the Act, 21 U.S.C. section 360bbb-3(b)(1), unless the authorization is terminated or revoked.     Resp Syncytial Virus by PCR NEGATIVE NEGATIVE Final    Comment: (NOTE) Fact Sheet for Patients: bloggercourse.com  Fact Sheet for Healthcare Providers: seriousbroker.it  This test is not yet approved or cleared by the [place]  FDA and has been authorized for detection and/or diagnosis of SARS-CoV-2 by FDA under an Emergency Use Authorization (EUA). This EUA will remain in effect (meaning this test can be used) for the duration of the COVID-19 declaration under Section 564(b)(1) of the Act, 21 U.S.C. section 360bbb-3(b)(1), unless the authorization is terminated or revoked.  Performed at [facility], [address] [place], [place] [address]   Culture, blood (Routine X 2) w Reflex to ID Panel     Status: None (Preliminary result)   Collection Time: 03/29/24  6:23 PM   Specimen: BLOOD  Result Value Ref Range Status   Specimen Description BLOOD BLOOD RIGHT HAND  Final   Special Requests   Final    BOTTLES DRAWN AEROBIC AND ANAEROBIC Blood Culture results may not be optimal due to an inadequate volume of blood  received in culture bottles   Culture   Final    NO GROWTH 3 DAYS Performed at [facility], [address] [place], [place] [address]    Report Status PENDING  Incomplete  Culture, blood (Routine X 2) w Reflex to ID Panel     Status: None (Preliminary result)   Collection Time: 03/29/24 11:16 PM   Specimen: BLOOD RIGHT HAND  Result Value Ref Range Status   Specimen Description BLOOD RIGHT HAND  Final   Special Requests   Final    BOTTLES DRAWN AEROBIC ONLY Blood Culture adequate volume   Culture   Final    NO GROWTH 3 DAYS Performed at [facility], [address], [place], [place] [address]    Report Status PENDING  Incomplete  Respiratory (~20 pathogens) panel by PCR     Status: None   Collection Time: 03/31/24  9:00 AM   Specimen: Nasopharyngeal Swab; Respiratory  Result Value Ref Range Status   Adenovirus NOT DETECTED NOT DETECTED Final   Coronavirus 229E NOT DETECTED NOT DETECTED Final    Comment: (NOTE) The Coronavirus on the Respiratory Panel, DOES NOT test for the novel  Coronavirus (2019 nCoV)    Coronavirus HKU1 NOT DETECTED NOT DETECTED Final   Coronavirus NL63 NOT DETECTED NOT DETECTED Final   Coronavirus  OC43 NOT DETECTED NOT DETECTED Final   Metapneumovirus NOT DETECTED NOT DETECTED Final   Rhinovirus / Enterovirus NOT DETECTED NOT DETECTED Final   Influenza A NOT DETECTED NOT DETECTED Final   Influenza B NOT DETECTED NOT DETECTED Final   Parainfluenza Virus 1 NOT DETECTED NOT DETECTED Final   Parainfluenza Virus 2 NOT DETECTED NOT DETECTED Final   Parainfluenza Virus 3 NOT DETECTED NOT DETECTED Final   Parainfluenza Virus 4 NOT DETECTED NOT DETECTED Final   Respiratory Syncytial Virus NOT DETECTED NOT DETECTED Final   Bordetella pertussis NOT DETECTED NOT DETECTED Final   Bordetella Parapertussis NOT DETECTED NOT DETECTED Final   Chlamydophila pneumoniae NOT DETECTED NOT DETECTED Final   Mycoplasma pneumoniae NOT DETECTED NOT DETECTED  Final    Comment: Performed at [facility], [address] [place], [place] [address]  CSF culture w Gram Stain     Status: None (Preliminary result)   Collection Time: 03/31/24 11:07 AM   Specimen: PATH Cytology CSF; Cerebrospinal Fluid  Result Value Ref Range Status   Specimen Description CSF  Final   Special Requests C2  Final   Gram Stain   Final    WBC SEEN RED BLOOD CELLS NO ORGANISMS SEEN Performed at [facility], [address] [place], [place] [address]    Culture PENDING  Incomplete   Report Status PENDING  Incomplete    RADIOLOGY STUDIES/RESULTS: DG FL GUIDED LUMBAR PUNCTURE Result Date: 04/01/2024 CLINICAL DATA:  74 year old female admitted with acute altered mental status, complicated by fever of unknown origin. IR was requested for diagnostic lumbar puncture. EXAM: DIAGNOSTIC LUMBAR PUNCTURE UNDER FLUOROSCOPIC GUIDANCE COMPARISON:  None Available. FLUOROSCOPY: Radiation Exposure Index (as provided by the fluoroscopic device): 13.5 mGy Kerma PROCEDURE: Informed consent was obtained from the patient prior to the procedure, including potential complications of headache, allergy, and pain. With the patient prone, the lower back was prepped with Betadine . 1% Lidocaine  was used for local anesthesia. Lumbar puncture was performed at the L4-L5 level using a 22 gauge needle with return of clear CSF with an opening pressure of 15 cm water. Five ml of CSF were obtained for laboratory studies. The patient tolerated the procedure well and there were no apparent complications. IMPRESSION: Technically successful lumbar puncture. Procedure performed Carlin Griffon, PA-C, under the direct supervision of Dr. Frederic Specking, MD Electronically Signed   By: CHRISTELLA.  Shick M.D.   On: 04/01/2024 08:42     LOS: 1 day   Donalda Applebaum, MD  [facility]    To contact the attending provider between 7A-7P or the covering provider during after hours 7P-7A, please log into the web  site [email or web address] and access using universal [place] password for that web site. If you do not have the password, please call the hospital operator.  04/01/2024, 9:50 AM    "

## 2024-04-01 NOTE — Progress Notes (Signed)
 Patient unable to safely follow commands or swallow. Would only bite the straw. PO medication held and MD notified.

## 2024-04-01 NOTE — Evaluation (Signed)
 Occupational Therapy Evaluation Patient Details Name: Alicia Brennan MRN: [id number] DOB: 12-12-1950 Today's Date: 04/01/2024   History of Present Illness   74 yo F adm to 12/29 [facility] with AMS fevere concerns for aspiration pending d/c on 1/4 that was stopped due to seizure and fever. Family requested transfer to [facility] for seizures PMH pulmonary sarcoidosis, IBS, OA, HTN, dementia, R foot drop, chronic R side weakness and seizures.     Clinical Impressions Patient admitted for the diagnosis above.  PTA, it appears she was undergoing ST Rehab at a local SNF.  OT will continue efforts in the acute setting to address self feeding and possible improvement with transfers for decreased burden of care.  Patient will benefit from continued inpatient follow up therapy, <3 hours/day.     If plan is discharge home, recommend the following:   Two people to help with walking and/or transfers;Two people to help with bathing/dressing/bathroom     Functional Status Assessment   Patient has had a recent decline in their functional status and demonstrates the ability to make significant improvements in function in a reasonable and predictable amount of time.     Equipment Recommendations   None recommended by OT     Recommendations for Other Services         Precautions/Restrictions   Precautions Precautions: Fall Recall of Precautions/Restrictions: Impaired Precaution/Restrictions Comments: EEG Restrictions Weight Bearing Restrictions Per Provider Order: No     Mobility Bed Mobility   Bed Mobility: Supine to Sit     Supine to sit: +2 for physical assistance, Total assist, Max assist          Transfers Overall transfer level: Needs assistance Equipment used: Ambulation equipment used Transfers: Sit to/from Stand, Bed to chair/wheelchair/BSC Sit to Stand: Max assist, +2 physical assistance             Transfer via Lift Equipment: Stedy    Balance Overall balance  assessment: Needs assistance Sitting-balance support: Feet supported Sitting balance-Leahy Scale: Zero   Postural control: Posterior lean, Left lateral lean                                 ADL either performed or assessed with clinical judgement   ADL Overall ADL's : Needs assistance/impaired Eating/Feeding: Maximal assistance;Bed level;Cueing for sequencing;Cueing for compensatory techinques   Grooming: Maximal assistance;Bed level;Cueing for sequencing;Cueing for compensatory techniques   Upper Body Bathing: Total assistance   Lower Body Bathing: Total assistance   Upper Body Dressing : Total assistance   Lower Body Dressing: Total assistance   Toilet Transfer: Maximal assistance;+2 for safety/equipment Toilet Transfer Details (indicate cue type and reason): Use of Psychologist, Educational and Hygiene: Total assistance               Vision Patient Visual Report: No change from baseline       Perception Perception: Not tested       Praxis Praxis: Not tested       Pertinent Vitals/Pain Pain Assessment Pain Assessment: Faces Faces Pain Scale: Hurts even more Pain Location: generalized with supine to sit and with standing.  Unable to localize Pain Descriptors / Indicators: Grimacing, Sore Pain Intervention(s): Monitored during session     Extremity/Trunk Assessment Upper Extremity Assessment Upper Extremity Assessment: Overall WFL for tasks assessed;LUE deficits/detail;RUE deficits/detail RUE Deficits / Details: Global generalized weakness, ?tone versus stiffness to R elbow and shoulder RUE Coordination: decreased fine motor;decreased  gross motor LUE Deficits / Details: able to reach with L hand, but unable to reach her mouth LUE Coordination: decreased fine motor;decreased gross motor   Lower Extremity Assessment Lower Extremity Assessment: Defer to PT evaluation   Cervical / Trunk Assessment Cervical / Trunk Assessment:  Kyphotic   Communication Communication Communication: Impaired Factors Affecting Communication: Difficulty expressing self;Reduced clarity of speech   Cognition Arousal: Alert Behavior During Therapy: Flat affect Cognition: History of cognitive impairments, No family/caregiver present to determine baseline, Cognition impaired   Orientation impairments: Place, Time, Situation Awareness: Intellectual awareness impaired, Online awareness impaired Memory impairment (select all impairments): Short-term memory, Non-declarative long-term memory, Declarative long-term memory Attention impairment (select first level of impairment): Focused attention Executive functioning impairment (select all impairments): Initiation, Organization, Sequencing, Reasoning, Problem solving                   Following commands: Impaired Following commands impaired: Follows one step commands with increased time, Follows one step commands inconsistently     Cueing  General Comments   Cueing Techniques: Verbal cues;Gestural cues;Tactile cues      Exercises     Shoulder Instructions      Home Living Family/patient expects to be discharged to:: Skilled nursing facility                                        Prior Functioning/Environment Prior Level of Function : Patient poor historian/Family not available;Needs assist               ADLs Comments: assist for all ADL/IADL at facility    OT Problem List: Decreased strength;Decreased range of motion;Decreased activity tolerance;Impaired balance (sitting and/or standing);Decreased cognition;Decreased knowledge of precautions;Decreased knowledge of use of DME or AE;Impaired UE functional use   OT Treatment/Interventions: Self-care/ADL training;Therapeutic exercise;Energy conservation;DME and/or AE instruction;Therapeutic activities;Visual/perceptual remediation/compensation;Patient/family education;Balance training      OT  Goals(Current goals can be found in the care plan section)   Acute Rehab OT Goals Patient Stated Goal: Drink of water OT Goal Formulation: With patient Time For Goal Achievement: 04/15/24 Potential to Achieve Goals: Good ADL Goals Pt Will Perform Eating: with min assist;sitting Pt Will Perform Grooming: with min assist;sitting Pt Will Transfer to Toilet: with min assist;with +2 assist;stand pivot transfer;bedside commode   OT Frequency:  Min 2X/week    Co-evaluation PT/OT/SLP Co-Evaluation/Treatment: Yes Reason for Co-Treatment: For patient/therapist safety;Necessary to address cognition/behavior during functional activity;To address functional/ADL transfers   OT goals addressed during session: ADL's and self-care      AM-PAC OT 6 Clicks Daily Activity     Outcome Measure Help from another person eating meals?: A Lot Help from another person taking care of personal grooming?: A Lot Help from another person toileting, which includes using toliet, bedpan, or urinal?: Total Help from another person bathing (including washing, rinsing, drying)?: Total Help from another person to put on and taking off regular upper body clothing?: A Lot Help from another person to put on and taking off regular lower body clothing?: Total 6 Click Score: 9   End of Session Equipment Utilized During Treatment: Gait belt Nurse Communication: Mobility status  Activity Tolerance: Patient tolerated treatment well Patient left: in chair;with call bell/phone within reach  OT Visit Diagnosis: Other abnormalities of gait and mobility (R26.89);Muscle weakness (generalized) (M62.81);Other symptoms and signs involving the nervous system (R29.898);Other symptoms and signs involving cognitive function  Time: 1107-1130 OT Time Calculation (min): 23 min Charges:  OT General Charges $OT Visit: 1 Visit OT Evaluation $OT Eval Moderate Complexity: 1 Mod  04/01/2024  RP, OTR/L  Acute  Rehabilitation Services  Office:  [phone]   [name] 04/01/2024, 11:39 AM

## 2024-04-01 NOTE — TOC Initial Note (Signed)
 Transition of Care [facility]) - Initial/Assessment Note    Patient Details  Name: Alicia Brennan MRN: [id number] Date of Birth: May 21, 1950  Transition of Care [facility]) CM/SW Contact:    [name], LCSW Phone Number: 04/01/2024, 3:10 PM  Clinical Narrative:                 Patient transferred from [facility]. Workup was ongoing to try to find patient a new SNF for rehab as her prior one [facility]) is not in network with her new insurance plan. CSW faxed out referral to obtain new bed offers.     Expected Discharge Plan: Skilled Nursing Facility Barriers to Discharge: Insurance Authorization, SNF Pending bed offer, Continued Medical Work up   Patient Goals and CMS Choice Patient states their goals for this hospitalization and ongoing recovery are:: Return to SNF CMS Medicare.gov Compare Post Acute Care list provided to:: Patient Represenative (must comment) Choice offered to / list presented to : Adult Children [place] ownership interest in [facility].provided to:: Adult Children    Expected Discharge Plan and Services In-house Referral: Clinical Social Work   Post Acute Care Choice: Skilled Nursing Facility Living arrangements for the past 2 months: Skilled Nursing Facility                                      Prior Living Arrangements/Services Living arrangements for the past 2 months: Skilled Nursing Facility Lives with:: Facility Resident Patient language and need for interpreter reviewed:: Yes Do you feel safe going back to the place where you live?: Yes      Need for Family Participation in Patient Care: Yes (Comment) Care giver support system in place?: Yes (comment)   Criminal Activity/Legal Involvement Pertinent to Current Situation/Hospitalization: No - Comment as needed  Activities of Daily Living      Permission Sought/Granted Permission sought to share information with : Facility Medical Sales Representative, Family Supports Permission  granted to share information with : No  Share Information with NAME: Michala, Deblanc -Daughter [phone]  Permission granted to share info w AGENCY: SNFs        Emotional Assessment Appearance:: Appears stated age Attitude/Demeanor/Rapport: Unable to Assess Affect (typically observed): Unable to Assess Orientation: : Oriented to Self Alcohol  / Substance Use: Not Applicable Psych Involvement: No (comment)  Admission diagnosis:  Seizure (HCC) [R56.9] Fever [R50.9] Sepsis (HCC) [A41.9] Breakthrough seizure (HCC) [G40.919] Patient Active Problem List   Diagnosis Date Noted   Breakthrough seizure (HCC) 03/31/2024   Sepsis due to gram-negative UTI (HCC) 03/24/2024   Hypoglycemia 03/24/2024   Seizure disorder (HCC) 03/24/2024   Essential hypertension 03/24/2024   GERD without esophagitis 03/24/2024   Depression 03/24/2024   Acute metabolic encephalopathy 03/24/2024   Hypokalemia 03/24/2024   Debility 02/05/2024   Obesity, Class I, BMI 30-34.9 02/05/2024   UTI (urinary tract infection) due to urinary indwelling Foley catheter 01/31/2024   Vascular dementia (HCC) 01/31/2024   Osteoarthritis of both hips 11/15/2015   Avascular necrosis of bones of both hips (HCC) 11/15/2015   Solitary pulmonary nodule 05/03/2015   Localization-related (focal) (partial) idiopathic epilepsy and epileptic syndromes with seizures of localized onset, not intractable, without status epilepticus (HCC) 04/25/2015   Cancer (HCC)    Sarcoidosis of lung    Hypertension    Recurrent headache    PCP:  [name], Edra GRADE, MD Pharmacy:  No Pharmacies Listed    Social  Drivers of Health (SDOH) Social History: SDOH Screenings   Food Insecurity: Patient Unable To Answer (03/31/2024)  Recent Concern: Food Insecurity - Food Insecurity Present (01/31/2024)  Housing: Patient Unable To Answer (03/31/2024)  Transportation Needs: No Transportation Needs (01/31/2024)  Utilities: Not At Risk (01/31/2024)   Financial Resource Strain: Low Risk (11/16/2023)   Received from [facility]  Social Connections: Unknown (03/31/2024)  Recent Concern: Social Connections - Moderately Isolated (01/31/2024)  Tobacco Use: Low Risk (03/26/2024)   SDOH Interventions: Food Insecurity Interventions: Intervention Not Indicated Social Connections Interventions: Intervention Not Indicated   Readmission Risk Interventions     No data to display

## 2024-04-01 NOTE — Progress Notes (Signed)
 " NEUROLOGY CONSULT FOLLOW-UP NOTE   Date of service: April 01, 2024 Patient Name: Alicia Brennan MRN:  [id number] DOB:  18-Jun-1950 Chief Complaint: decreased speech Requesting Provider: Raenelle Donalda HERO, MD  History of Present Illness  Alicia Brennan is a 74 y.o. female with hx of advanced dementia who presented to [facility] d/t decreased altered mental status. She was found to have a fever, contributed to a UTI. This culture came back growing multiple species, she has continued on multiple antibiotics. There was continued concern for seizures and neurology was consulted, recommended transfer to [facility] for continuous EEG monitoring and her home AED medications were continued. LP was completed prior to transfer, negative for infection. MRI brain negative for acute abnormality, shows significant atrophy.   Patient drowsy in bed, awakens to voice. No further seizures noted overnight. Pending hook up to LTM EEG. Afebrile overnight, tMax 98.7. She is able to answer questions in a quiet voice. Oriented to self, knows her birthday, states its 2024.  Extensive conversation with daughter, Alicia Brennan (who works at [employer]) at bedside. She endorses that patient progressively got worse after her stent surgery, stent was removed and no infection was found. Patient was diagnosed with vascular dementia 3 years ago, but nothing was done about it. She states that patient usually is able to talk a little, but has been practically bed bound since going to the SNF in November.   Daughter has severe concerns about the facility her mother was at. Alicia Brennan states that she couldn't get the staff to answer her questions when she was trying to figure out why her mothers presentation was so different. She states that her mother was not being fed or given water, often her food was just sitting in front of her and no one helped her. With this, she has concerns about medication compliance at the facility, assuming that her  mother was not getting all of her medications. Patient also developed a pressure ulcer during her relatively short stay at this facility. Daughter states that she was told that Alicia Brennan was writing up a safety report on this facility.   Daughter states that patient often has shakes, but not to the degree that she had on my exam, where they resembled bilateral upper extremity tremors, stopping when held. She is unable to lift either leg, chronic right-sided weakness with right foot drop syndrome. Able to lift left arm off bed slowly, with tremor like movement that does stop when held.   Seizure onset 2016. cEEG August 2025 captured right temporal lobe seizure.  OP neurology visit 02/14/24: f/u for recent prolonged non-convulsive seizures. Home meds listed: Depakote  ER 750mg  at bedtime, Vimpat  50mg  BID, Lamictal  weaned up to 50mg  BID, Keppra  500mg  BID    ROS   Unable to ascertain due to AMS  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   Foley catheter in place    Hypertension    IBS (irritable bowel syndrome)    Leg fracture, left    Lymphedema    Mood disturbance    Recurrent headache    Sarcoidosis of lung    Seizures (HCC)    Thrombocytopenia    Vascular dementia (HCC) 01/31/2024   Wound of buttock    Right, pressure wound    Past Surgical History:  Procedure Laterality Date   BILATERAL ANTERIOR TOTAL HIP ARTHROPLASTY Bilateral 11/15/2015   Procedure: BILATERAL ANTERIOR TOTAL HIP ARTHROPLASTY;  Surgeon: Redell Shoals, MD;  Location: [facility];  Service: Orthopedics;  Laterality: Bilateral;   CARPAL TUNNEL RELEASE Bilateral    CHOLECYSTECTOMY     COLONOSCOPY     CYSTOSCOPY W/ URETERAL STENT PLACEMENT Left 01/03/2024   Procedure: CYSTOSCOPY, WITH RETROGRADE PYELOGRAM AND URETERAL STENT INSERTION;  Surgeon: Devere Lonni Righter, MD;  Location: [facility];  Service: Urology;  Laterality: Left;   CYSTOSCOPY/URETEROSCOPY/HOLMIUM  LASER/STENT PLACEMENT Left 02/13/2024   Procedure: CYSTOSCOPY/URETEROSCOPY/HOLMIUM LASER/STENT PLACEMENT;  Surgeon: Devere Lonni Righter, MD;  Location: [facility];  Service: Urology;  Laterality: Left;  CYSTOSCOPY/LEFT URETEROSCOPY/HOLMIUM LASER/STENT PLACEMENT   EYE SURGERY     r/t sarcoidisis   ORIF ELBOW FRACTURE Left    TIBIA FRACTURE SURGERY Left     Family History: Family History  Problem Relation Age of Onset   Dementia Mother    High blood pressure Father    Dementia Maternal Aunt    Sarcoidosis Son    Seizures Neg Hx     Social History  reports that she has never smoked. She has never used smokeless tobacco. She reports that she does not drink alcohol  and does not use drugs.  Allergies[1]  Medications  Current Medications[2]  Vitals   Vitals:   03/31/24 2118  BP: (!) 122/58  Pulse: 87  Resp: 16  Temp: 98.5 F (36.9 C)  TempSrc: Oral  SpO2: 96%  Weight: 69 kg  Height: 5' 4 (1.626 m)    Body mass index is 26.11 kg/m.   Physical Exam   Constitutional: Appears well-developed, elderly.  Cardiovascular: Normal rate and regular rhythm.  Respiratory: Effort normal, non-labored breathing.  Neurologic Examination   Patient is lying in bed. She is able to tell me her name, states 2023 for the year. Unable to tell me the month, her age or her birthday. When I ask her where she is, she says, they keep telling me Im at [facility]. At first, she did not recognize her daughter at bedside. After she was reminded who she was, she was able to repeat her daughter's name.  She intermittently follows very simple commands, unable to answer further orientation questions or follow complex commands.  She does not consistently hold eye contact and rarely tracks examiner. She has chronic right-sided weakness. Able to lift left arm off bed. Unable to lift either leg of bed, bilateral feet ar swollen and she groans in pain when legs are passively moved.  Bilateral upper  extremity tremor like movement seen, this stops when patient's hands are held. Daughter states that shakes are normal in patient, but not to this degree.   Labs/Imaging/Neurodiagnostic studies   CBC:  Recent Labs  Lab 2024-03-31 0510 04/01/24 0257  WBC 4.6 9.8  HGB 10.7* 11.9*  HCT 33.4* 37.7  MCV 93.6 96.7  PLT 181 201   Basic Metabolic Panel:  Lab Results  Component Value Date   NA 146 (H) 04/01/2024   K 3.4 (L) 04/01/2024   CO2 22 04/01/2024   GLUCOSE 81 04/01/2024   BUN 23 04/01/2024   CREATININE 0.90 04/01/2024   CALCIUM 9.5 04/01/2024   GFRNONAA >60 04/01/2024   GFRAA >60 08/16/2019   Lipid Panel: No results found for: LDLCALC HgbA1c: No results found for: HGBA1C Urine Drug Screen:     Component Value Date/Time   LABOPIA NONE DETECTED 04/25/2015 0825   COCAINSCRNUR NONE DETECTED 04/25/2015 0825   LABBENZ NONE DETECTED 04/25/2015 0825   AMPHETMU NONE DETECTED 04/25/2015 0825   THCU NONE DETECTED 04/25/2015  0825   LABBARB NONE DETECTED 04/25/2015 0825    Alcohol  Level     Component Value Date/Time   ETH <15 09/11/2023 2335   INR  Lab Results  Component Value Date   INR 1.2 03/24/2024   APTT  Lab Results  Component Value Date   APTT 30 11/27/2021   AED levels:  Lab Results  Component Value Date   LAMOTRIGINE  6.1 01/02/2024   LEVETIRACETA 28.6 01/02/2024    LP labs: CSF Cell Count: Clear, WBC: 3, RBC: 20 CSF Culture: Negative M/E Panel: Negative Protein: 25, Glucose: 58  Resp Panel: Negative ProCal: 0.33 Blood Culture: Negative  Urine Culture: Multiple Species present, suggest recollection  CT Head without contrast(Personally reviewed):  No acute intracranial abnormality. Moderate global parenchymal volume loss, slightly advanced for age, with mild periventricular white matter changes likely due to small vessel ischemia. Mild ventriculomegaly, likely ex vacuo, unchanged.  MRI Brain 03/27/2024 (Personally reviewed): No acute intracranial  abnormality. Moderate chronic microvascular ischemic changes. Moderate generalized parenchymal volume loss with concordant prominence of the lateral ventricles. Moderate left mastoid effusion.  Neurodiagnostics cEEG:  Pending hook up  ASSESSMENT   Takila Kronberg is a 74 y.o. female with fevers of unknown origin and worsening mental status in the setting of dementia and history of nonconvulsive seizures. She is on three antiepileptics, and unclear if she has been having seizures.   Daughter endorses decreased PO intake at facility, recent worsening of baseline confusion and dementia (diagnosed 3 years ago). Albumin  low, Na elvated, Cr elevated on admission--dehydration and malnutrition likely. With these findings and her presentation, would add on vitamin labs to see if these could be part of the current etiology.  Aspiration pneumonia was also a concern as the source of her fever.   Patient has a known seizure disorder and medication compliance is a family concern at her current facility. Pending continuous EEG placement and review. No changes to home meds needed at this time.   RECOMMENDATIONS   - LTM EEG monitoring - repeat UA and Culture (ordered) - Ammonia level (ordered) - Conitnue vimpat  50 bid - Continue depacon  250 tid - Continue keppra  500mg  BID - Unable to take home Lamictal , as she is still NPO. Continue once able to take PO meds.  ______________________________________________________________________  Bonney Rocky JAYSON Judithe, NP [facility]   I have seen the patient reviewed the above note.  LP was unremarkable, no need to continue CNS coverage.  Given her history of seizures, we will continue her home medications and I do think that monitoring overnight with LTM EEG would be prudent  If this is negative and she continues to improve, then I would simply continue her current regimen.  Aisha Seals, MD [facility]   If 7pm- 7am, please  page neurology on call as listed in AMION.      [1] No Known Allergies [2]  Current Facility-Administered Medications:    acetaminophen  (TYLENOL ) tablet 650 mg, 650 mg, Oral, Q6H PRN **OR** acetaminophen  (TYLENOL ) suppository 650 mg, 650 mg, Rectal, Q6H PRN, Adefeso, Oladapo, DO   acyclovir  (ZOVIRAX ) 700 mg in dextrose  5 % 100 mL IVPB, 700 mg, Intravenous, Q12H, Adefeso, Oladapo, DO   ampicillin  (OMNIPEN) 2 g in sodium chloride  0.9 % 100 mL IVPB, 2 g, Intravenous, Q6H, Adefeso, Oladapo, DO, Last Rate: 300 mL/hr at 04/01/24 0658, 2 g at 04/01/24 0658   cefTRIAXone  (ROCEPHIN ) 2 g in sodium chloride  0.9 % 100 mL IVPB, 2 g, Intravenous, Q12H, Adefeso, Oladapo, DO  enoxaparin  (LOVENOX ) injection 40 mg, 40 mg, Subcutaneous, Q24H, Adefeso, Oladapo, DO   lacosamide  (VIMPAT ) tablet 50 mg, 50 mg, Oral, BID, Adefeso, Oladapo, DO   levETIRAcetam  (KEPPRA ) tablet 500 mg, 500 mg, Oral, BID, Adefeso, Oladapo, DO   ondansetron  (ZOFRAN ) tablet 4 mg, 4 mg, Oral, Q6H PRN **OR** ondansetron  (ZOFRAN ) injection 4 mg, 4 mg, Intravenous, Q6H PRN, Adefeso, Oladapo, DO   Oral care mouth rinse, 15 mL, Mouth Rinse, 4 times per day, Adefeso, Oladapo, DO   Oral care mouth rinse, 15 mL, Mouth Rinse, PRN, Adefeso, Oladapo, DO   pantoprazole  (PROTONIX ) EC tablet 40 mg, 40 mg, Oral, Daily, Adefeso, Oladapo, DO   sertraline  (ZOLOFT ) tablet 25 mg, 25 mg, Oral, Daily, Adefeso, Oladapo, DO   valproate (DEPACON ) 250 mg in dextrose  5 % 50 mL IVPB, 250 mg, Intravenous, Q8H, Adefeso, Oladapo, DO, Last Rate: 52.5 mL/hr at 04/01/24 0616, Infusion Verify at 04/01/24 0616   vancomycin  variable dose per unstable renal function (pharmacist dosing), , Does not apply, See admin instructions, Laron Agent, RPH  "

## 2024-04-01 NOTE — Evaluation (Signed)
 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   Foley catheter in place    Hypertension    IBS (irritable bowel syndrome)    Leg fracture, left    Lymphedema    Mood disturbance    Recurrent headache    Sarcoidosis of lung    Seizures (HCC)    Thrombocytopenia    Vascular dementia (HCC) 01/31/2024   Wound of buttock    Right, pressure wound   Past Surgical History:  Past Surgical History:  Procedure Laterality Date   BILATERAL ANTERIOR TOTAL HIP ARTHROPLASTY Bilateral 11/15/2015   Procedure: BILATERAL ANTERIOR TOTAL HIP ARTHROPLASTY;  Surgeon: Redell Shoals, MD;  Location: [facility] OR;  Service: Orthopedics;  Laterality: Bilateral;   CARPAL TUNNEL RELEASE Bilateral    CHOLECYSTECTOMY     COLONOSCOPY     CYSTOSCOPY W/ URETERAL STENT PLACEMENT Left 01/03/2024   Procedure: CYSTOSCOPY, WITH RETROGRADE PYELOGRAM AND URETERAL STENT INSERTION;  Surgeon: Devere Lonni Righter, MD;  Location: [facility];  Service: Urology;  Laterality: Left;   CYSTOSCOPY/URETEROSCOPY/HOLMIUM LASER/STENT PLACEMENT Left 02/13/2024   Procedure: CYSTOSCOPY/URETEROSCOPY/HOLMIUM LASER/STENT PLACEMENT;  Surgeon: Devere Lonni Righter, MD;  Location: [facility];  Service: Urology;  Laterality: Left;  CYSTOSCOPY/LEFT URETEROSCOPY/HOLMIUM LASER/STENT PLACEMENT   EYE SURGERY     r/t sarcoidisis   ORIF ELBOW FRACTURE Left    TIBIA FRACTURE SURGERY Left    HPI:  74 yo female admitted to [facility] 12/29 with AMS and fever. Transferred to [facility] 1/5 for LTM. MRI negative. CXR 1/3 shows minimal L lingular subsegmental atelectasis. Most recently seen in September 2023 with functional-appearing swallowing and moderate  cognitive impairment. PMH includes dementia, pulmonary sarcoidosis, IBS, OA, chronic R sided weakness, seizures    Assessment / Plan / Recommendation  Clinical Impression  Recommend regular diet with thin liquids and full supervision in light of fluctuating mentation. She independently uses strategies to clear her oral cavity. Ongoing SLP f/u is not recommended acutely.   Pt is alert and sitting upright in the chair. She followed commands and made her needs known. One instance of throat clearance followed the initial sip of water but this did not recur in any subsequent trial across three 8 oz cups. Oral clearance was complete and although transit was a bit prolonged with solids, she independently used a liquid wash.   SLP Visit Diagnosis: Dysphagia, unspecified (R13.10)    Aspiration Risk  Mild aspiration risk    Diet Recommendation           Other Recommendations Oral Care Recommendations: Oral care BID     Swallow Evaluation Recommendations Recommendations: PO diet PO Diet Recommendation: Regular;Thin liquids (Level 0) Liquid Administration via: Cup;Straw Medication Administration: Whole meds with puree Supervision: Full assist for feeding;Full supervision/cueing for swallowing strategies Swallowing strategies  : Minimize environmental distractions;Slow rate;Small bites/sips Postural changes: Position pt fully upright for meals Oral care recommendations: Oral care BID (2x/day)   Assistance Recommended at Discharge    Functional Status Assessment Patient has not had a recent decline in their functional status  Frequency and Duration            Prognosis Prognosis for improved oropharyngeal function: Good Barriers to Reach Goals:  Cognitive deficits      Swallow Study   General HPI: 74 yo female admitted to [facility] 12/29 with AMS and fever. Transferred to [facility] 1/5 for LTM. MRI negative. CXR 1/3 shows minimal L lingular subsegmental atelectasis. Most recently seen in September  2023 with functional-appearing swallowing and moderate cognitive impairment. PMH includes dementia, pulmonary sarcoidosis, IBS, OA, chronic R sided weakness, seizures Type of Study: Bedside Swallow Evaluation Previous Swallow Assessment: see HPI Diet Prior to this Study: NPO Temperature Spikes Noted: No Respiratory Status: Room air History of Recent Intubation: No Behavior/Cognition: Alert;Cooperative Oral Cavity Assessment: Within Functional Limits Oral Care Completed by SLP: No Oral Cavity - Dentition: Adequate natural dentition Vision: Functional for self-feeding Self-Feeding Abilities: Able to feed self Patient Positioning: Upright in chair Baseline Vocal Quality: Normal Volitional Cough: Strong Volitional Swallow: Able to elicit    Oral/Motor/Sensory Function Overall Oral Motor/Sensory Function: Within functional limits   Ice Chips Ice chips: Not tested   Thin Liquid Thin Liquid: Impaired Presentation: Straw;Self Fed Pharyngeal  Phase Impairments: Throat Clearing - Immediate    Nectar Thick Nectar Thick Liquid: Not tested   Honey Thick Honey Thick Liquid: Not tested   Puree Puree: Within functional limits Presentation: Spoon   Solid     Solid: Within functional limits      Damien Blumenthal, M.A., CCC-SLP Speech Language Pathology, Acute Rehabilitation Services  Secure Chat preferred [phone]  04/01/2024,1:03 PM

## 2024-04-01 NOTE — Plan of Care (Signed)
 RN reported that due to altered mental status unable to swallow oral Vimpat  and Keppra .  I have switched oral to IV forms.   Chapman Matteucci, MD [facility] 04/01/2024, 10:57 PM

## 2024-04-01 NOTE — Procedures (Signed)
 PROCEDURE SUMMARY:  Successful fluoroscopic guided lumbar puncture. No immediate complications.  Pt tolerated well.   EBL = none  Please see full dictation in imaging section of Epic for procedure details.

## 2024-04-02 ENCOUNTER — Inpatient Hospital Stay (HOSPITAL_COMMUNITY)

## 2024-04-02 DIAGNOSIS — G40919 Epilepsy, unspecified, intractable, without status epilepticus: Secondary | ICD-10-CM | POA: Diagnosis not present

## 2024-04-02 DIAGNOSIS — I10 Essential (primary) hypertension: Secondary | ICD-10-CM | POA: Diagnosis not present

## 2024-04-02 DIAGNOSIS — R569 Unspecified convulsions: Secondary | ICD-10-CM | POA: Diagnosis not present

## 2024-04-02 DIAGNOSIS — K219 Gastro-esophageal reflux disease without esophagitis: Secondary | ICD-10-CM | POA: Diagnosis not present

## 2024-04-02 DIAGNOSIS — F39 Unspecified mood [affective] disorder: Secondary | ICD-10-CM | POA: Diagnosis not present

## 2024-04-02 DIAGNOSIS — F0390 Unspecified dementia without behavioral disturbance: Secondary | ICD-10-CM | POA: Diagnosis not present

## 2024-04-02 LAB — BASIC METABOLIC PANEL WITH GFR
Anion gap: 11 (ref 5–15)
BUN: 15 mg/dL (ref 8–23)
CO2: 23 mmol/L (ref 22–32)
Calcium: 9 mg/dL (ref 8.9–10.3)
Chloride: 111 mmol/L (ref 98–111)
Creatinine, Ser: 0.65 mg/dL (ref 0.44–1.00)
GFR, Estimated: >60 mL/min
Glucose, Bld: 102 mg/dL — ABNORMAL HIGH (ref 70–99)
Potassium: 2.9 mmol/L — ABNORMAL LOW (ref 3.5–5.1)
Sodium: 145 mmol/L (ref 135–145)

## 2024-04-02 LAB — CBC
HCT: 32.3 % — ABNORMAL LOW (ref 36.0–46.0)
Hemoglobin: 10.4 g/dL — ABNORMAL LOW (ref 12.0–15.0)
MCH: 30.2 pg (ref 26.0–34.0)
MCHC: 32.2 g/dL (ref 30.0–36.0)
MCV: 93.9 fL (ref 80.0–100.0)
Platelets: 200 K/uL (ref 150–400)
RBC: 3.44 MIL/uL — ABNORMAL LOW (ref 3.87–5.11)
RDW: 14.3 % (ref 11.5–15.5)
WBC: 7.5 K/uL (ref 4.0–10.5)
nRBC: 0 % (ref 0.0–0.2)

## 2024-04-02 LAB — SYPHILIS: RPR W/REFLEX TO RPR TITER AND TREPONEMAL ANTIBODIES, TRADITIONAL SCREENING AND DIAGNOSIS ALGORITHM: RPR Ser Ql: NONREACTIVE

## 2024-04-02 MED ORDER — POTASSIUM CHLORIDE IN NACL 20-0.45 MEQ/L-% IV SOLN
INTRAVENOUS | Status: AC
  Filled 2024-04-02 (×2): qty 1000

## 2024-04-02 MED ORDER — POTASSIUM CHLORIDE 20 MEQ PO PACK
40.0000 meq | PACK | Freq: Once | ORAL | Status: AC
  Administered 2024-04-02: 40 meq via ORAL
  Filled 2024-04-02: qty 2

## 2024-04-02 NOTE — Progress Notes (Signed)
 "                        PROGRESS NOTE        PATIENT DETAILS Name: Alicia Brennan Age: 74 y.o. Sex: female Date of Birth: 08-21-1950 Admit Date: 03/31/2024 Admitting Physician Posey Maier, DO ERE:Qzops Tor Edra GRADE, MD  Brief Summary: Patient is a 74 y.o.  female with history of dementia, seizure disorder-who was transferred from [facility] for LTM EEG.  Significant events: 12/28-01/5>> hospitalization at [facility] for altered mental status-fever at [facility] workup negative-transferred to [facility] for LTM EEG. 01/5>> admit to TRH at [facility].  Significant studies: 12/28>> CT head: No acute intracranial abnormality 12/29>> CXR: No PNA 12/31>> RUE Doppler: No DVT 01/01>> MRI brain: No acute intracranial abnormality 01/03>> CXR: Left lingula subsegmental atelectasis.  Significant microbiology data: 12/28>> COVID/influenza/RSV PCR: Negative 12/29>> blood culture: No growth 12/29>> urine culture: Multiple species 12/29>> respiratory virus panel: Negative 01/03>> blood culture: No growth 01/03>> COVID/influenza/RSV PCR: Negative 01/05>> respiratory virus panel: Negative 01/05>> CSF meningitis panel: Negative 01/05>> CSF culture: Pending  Procedures: 1/5>> fluoroscopy-guided LP  Consults: Neurology  Subjective: Awake-following simple commands.  Objective: Vitals: Blood pressure (!) 145/65, pulse 76, temperature 98 F (36.7 C), temperature source Oral, resp. rate 18, height 5' 4 (1.626 m), weight 69 kg, SpO2 96%.   Exam: Frail appearing but not in any distress.  Chest: Clear to auscultation anteriorly CVS: S1-S2 regular Abdomen: Soft nontender nondistended Extremities: No edema Neurology: Generalized weakness but nonfocal-appears to have some amount of intention tremor and mild rigidity at baseline.  Pertinent Labs/Radiology:    Latest Ref Rng & Units 04/02/2024    3:41 AM 04/01/2024    2:57 AM 03/27/2024    5:10 AM  CBC  WBC 4.0 - 10.5 K/uL 7.5  9.8  4.6   Hemoglobin  12.0 - 15.0 g/dL 89.5  88.0  89.2   Hematocrit 36.0 - 46.0 % 32.3  37.7  33.4   Platelets 150 - 400 K/uL 200  201  181     Lab Results  Component Value Date   NA 145 04/02/2024   K 2.9 (L) 04/02/2024   CL 111 04/02/2024   CO2 23 04/02/2024      Assessment/Plan: Fever Had episodes of fever at [facility] on 1/3-1/5-none since then. Has had extensive workup-see above-no source apparent Although UA suggestive of UTI-urine cultures nondiagnostic Suspicion for aspiration dermatitis-in the setting of AMS-and now on Unasyn .   If fever reoccurs-needs CT imaging of chest/abdomen and then B/L Dopplers.  Acute metabolic encephalopathy Thought to be secondary to infectious disease in the setting of fever-but concerned that patient could be having nonconvulsive seizures-LTM EEG negative for seizures. Continue Keppra /Vimpat /Depakote .  Seizure disorder See above  Mood disorder Zoloft   GERD PPI  HTN BP stable Micardis on hold-resume when able.  Hypokalemia Replete/recheck.  Hypernatremia Likely secondary to poor oral intake Encourage oral intake Continue half-normal saline for another 24 hours.  Dementia Delirium precautions  Palliative care Remains a full code Long discussion with patient's daughter over the phone on 1/7-although at SNF-patient has refused PT/OT for several months-and has had very poor oral intake for around 4-5 months.  Currently-much worse than usual baseline with debility/deconditioning and even worse oral intake.  Daughter apparently was asking nursing staff for feeding tubes-after my conversation with patient's daughter-we have decided to first pursue palliative care conversation.  She understands that placing feeding tubes in a  patient with advanced dementia-really does  not prolong life nor does it provide any quality.  She will need to be sedated to keep the tube in-which increases the risk of aspiration.  After extensive discussion-we have decided to hold off  for now-hydrate with IVF-treat underlying etiologies-follow clinical course and continue goals of care discussion  Pressure Ulcer: Agree with assessment as outlined below. Wound 03/24/24 1721 Pressure Injury Buttocks Medial Stage 2 -  Partial thickness loss of dermis presenting as a shallow open injury with a red, pink wound bed without slough. (Active)   Code status:   Code Status: Full Code   DVT Prophylaxis: enoxaparin  (LOVENOX ) injection 40 mg Start: 04/01/24 1000 SCDs Start: 04/01/24 0023   Family Communication:Daughter-[name] [phone] (cell)-[phone] (work)-updated 1/7   Disposition Plan: Status is: Inpatient Remains inpatient appropriate because: Severity of illness   Planned Discharge Destination:Skilled nursing facility   Diet: Diet Order             Diet regular Room service appropriate? Yes with Assist; Fluid consistency: Thin  Diet effective now                     Antimicrobial agents: Anti-infectives (From admission, onward)    Start     Dose/Rate Route Frequency Ordered Stop   04/01/24 1100  Ampicillin -Sulbactam (UNASYN ) 3 g in sodium chloride  0.9 % 100 mL IVPB        3 g 200 mL/hr over 30 Minutes Intravenous Every 6 hours 04/01/24 1003 04/06/24 1359   04/01/24 1000  cefTRIAXone  (ROCEPHIN ) 2 g in sodium chloride  0.9 % 100 mL IVPB  Status:  Discontinued        2 g 200 mL/hr over 30 Minutes Intravenous Every 12 hours 04/01/24 0137 04/01/24 0152   04/01/24 1000  acyclovir  (ZOVIRAX ) 700 mg in dextrose  5 % 100 mL IVPB  Status:  Discontinued        700 mg 114 mL/hr over 60 Minutes Intravenous Every 12 hours 04/01/24 0238 04/01/24 1002   04/01/24 1000  cefTRIAXone  (ROCEPHIN ) 2 g in sodium chloride  0.9 % 100 mL IVPB  Status:  Discontinued        2 g 200 mL/hr over 30 Minutes Intravenous Every 12 hours 04/01/24 0238 04/01/24 1002   04/01/24 1000  vancomycin  (VANCOCIN ) IVPB 1000 mg/200 mL premix  Status:  Discontinued        1,000 mg 200 mL/hr  over 60 Minutes Intravenous Every 24 hours 04/01/24 0238 04/01/24 0307   04/01/24 1000  cefTRIAXone  (ROCEPHIN ) 2 g in sodium chloride  0.9 % 100 mL IVPB  Status:  Discontinued        2 g 200 mL/hr over 30 Minutes Intravenous Every 24 hours 04/01/24 0152 04/01/24 0259   04/01/24 0600  ampicillin  (OMNIPEN) 2 g in sodium chloride  0.9 % 100 mL IVPB  Status:  Discontinued        2 g 300 mL/hr over 20 Minutes Intravenous Every 6 hours 04/01/24 0238 04/01/24 1002   04/01/24 0307  vancomycin  variable dose per unstable renal function (pharmacist dosing)  Status:  Discontinued         Does not apply See admin instructions 04/01/24 0307 04/01/24 1002        MEDICATIONS: Scheduled Meds:  enoxaparin  (LOVENOX ) injection  40 mg Subcutaneous Q24H   levETIRAcetam   500 mg Intravenous Q12H   mouth rinse  15 mL Mouth Rinse 4 times per day   pantoprazole   40 mg Oral Daily   potassium chloride   40  mEq Oral Once   sertraline   25 mg Oral Daily   Continuous Infusions:  ampicillin -sulbactam (UNASYN ) IV 3 g (04/02/24 0828)   lacosamide  (VIMPAT ) IV 50 mg (04/02/24 0938)   valproate (DEPACON ) 250 mg in dextrose  5 % 50 mL IVPB 250 mg (04/02/24 0606)   PRN Meds:.acetaminophen  **OR** acetaminophen , ondansetron  **OR** ondansetron  (ZOFRAN ) IV, mouth rinse   I have personally reviewed following labs and imaging studies  LABORATORY DATA: CBC: Recent Labs  Lab 03/27/24 0510 04/01/24 0257 04/02/24 0341  WBC 4.6 9.8 7.5  HGB 10.7* 11.9* 10.4*  HCT 33.4* 37.7 32.3*  MCV 93.6 96.7 93.9  PLT 181 201 200    Basic Metabolic Panel: Recent Labs  Lab 03/27/24 0510 03/31/24 0656 04/01/24 0257 04/02/24 0341  NA 136 148* 146* 145  K 3.5 3.8 3.4* 2.9*  CL 102 112* 112* 111  CO2 24 24 22 23   GLUCOSE 107* 103* 81 102*  BUN 12 25* 23 15  CREATININE 0.60 1.17* 0.90 0.65  CALCIUM 8.8* 9.4 9.5 9.0  MG 1.9  --  2.2  --   PHOS  --   --  3.1  --     GFR: Estimated Creatinine Clearance: 57.9 mL/min (by C-G  formula based on SCr of 0.65 mg/dL).  Liver Function Tests: Recent Labs  Lab 04/01/24 0257  AST 90*  ALT 41  ALKPHOS 80  BILITOT 0.4  PROT 7.0  ALBUMIN  2.7*   No results for input(s): LIPASE, AMYLASE in the last 168 hours. Recent Labs  Lab 04/01/24 0915  AMMONIA 26    Coagulation Profile: No results for input(s): INR, PROTIME in the last 168 hours.  Cardiac Enzymes: Recent Labs  Lab 03/30/24 2040  CKTOTAL 100    BNP (last 3 results) Recent Labs    12/06/23 1658  PROBNP 64.3    Lipid Profile: No results for input(s): CHOL, HDL, LDLCALC, TRIG, CHOLHDL, LDLDIRECT in the last 72 hours.  Thyroid Function Tests: Recent Labs    04/01/24 1146  TSH 3.190    Anemia Panel: Recent Labs    04/01/24 1146  VITAMINB12 703  FOLATE 10.9    Urine analysis:    Component Value Date/Time   COLORURINE YELLOW 03/31/2024 2112   APPEARANCEUR CLOUDY (A) 03/31/2024 2112   APPEARANCEUR Clear 05/17/2013 1525   LABSPEC 1.017 03/31/2024 2112   LABSPEC 1.019 05/17/2013 1525   PHURINE 6.0 03/31/2024 2112   GLUCOSEU NEGATIVE 03/31/2024 2112   GLUCOSEU Negative 05/17/2013 1525   HGBUR SMALL (A) 03/31/2024 2112   BILIRUBINUR NEGATIVE 03/31/2024 2112   BILIRUBINUR Negative 05/17/2013 1525   KETONESUR NEGATIVE 03/31/2024 2112   PROTEINUR 30 (A) 03/31/2024 2112   NITRITE NEGATIVE 03/31/2024 2112   LEUKOCYTESUR LARGE (A) 03/31/2024 2112   LEUKOCYTESUR Trace 05/17/2013 1525    Sepsis Labs: Lactic Acid, Venous    Component Value Date/Time   LATICACIDVEN 1.0 03/24/2024 0147    MICROBIOLOGY: Recent Results (from the past 240 hours)  Resp panel by RT-PCR (RSV, Flu A&B, Covid) Anterior Nasal Swab     Status: None   Collection Time: 03/23/24  9:16 PM   Specimen: Anterior Nasal Swab  Result Value Ref Range Status   SARS Coronavirus 2 by RT PCR NEGATIVE NEGATIVE Final    Comment: (NOTE) SARS-CoV-2 target nucleic acids are NOT DETECTED.  The SARS-CoV-2  RNA is generally detectable in upper respiratory specimens during the acute phase of infection. The lowest concentration of SARS-CoV-2 viral copies this assay can detect is 138  copies/mL. A negative result does not preclude SARS-Cov-2 infection and should not be used as the sole basis for treatment or other patient management decisions. A negative result may occur with  improper specimen collection/handling, submission of specimen other than nasopharyngeal swab, presence of viral mutation(s) within the areas targeted by this assay, and inadequate number of viral copies(<138 copies/mL). A negative result must be combined with clinical observations, patient history, and epidemiological information. The expected result is Negative.  Fact Sheet for Patients:  bloggercourse.com  Fact Sheet for Healthcare Providers:  seriousbroker.it  This test is no t yet approved or cleared by the [place]  FDA and  has been authorized for detection and/or diagnosis of SARS-CoV-2 by FDA under an Emergency Use Authorization (EUA). This EUA will remain  in effect (meaning this test can be used) for the duration of the COVID-19 declaration under Section 564(b)(1) of the Act, 21 U.S.C.section 360bbb-3(b)(1), unless the authorization is terminated  or revoked sooner.       Influenza A by PCR NEGATIVE NEGATIVE Final   Influenza B by PCR NEGATIVE NEGATIVE Final    Comment: (NOTE) The Xpert Xpress SARS-CoV-2/FLU/RSV plus assay is intended as an aid in the diagnosis of influenza from Nasopharyngeal swab specimens and should not be used as a sole basis for treatment. Nasal washings and aspirates are unacceptable for Xpert Xpress SARS-CoV-2/FLU/RSV testing.  Fact Sheet for Patients: bloggercourse.com  Fact Sheet for Healthcare Providers: seriousbroker.it  This test is not yet approved or cleared by the  [place]  FDA and has been authorized for detection and/or diagnosis of SARS-CoV-2 by FDA under an Emergency Use Authorization (EUA). This EUA will remain in effect (meaning this test can be used) for the duration of the COVID-19 declaration under Section 564(b)(1) of the Act, 21 U.S.C. section 360bbb-3(b)(1), unless the authorization is terminated or revoked.     Resp Syncytial Virus by PCR NEGATIVE NEGATIVE Final    Comment: (NOTE) Fact Sheet for Patients: bloggercourse.com  Fact Sheet for Healthcare Providers: seriousbroker.it  This test is not yet approved or cleared by the [place]  FDA and has been authorized for detection and/or diagnosis of SARS-CoV-2 by FDA under an Emergency Use Authorization (EUA). This EUA will remain in effect (meaning this test can be used) for the duration of the COVID-19 declaration under Section 564(b)(1) of the Act, 21 U.S.C. section 360bbb-3(b)(1), unless the authorization is terminated or revoked.  Performed at [facility], [address] [place], [place] [address]   Blood culture (routine x 2)     Status: None   Collection Time: 03/24/24  1:47 AM   Specimen: BLOOD LEFT ARM  Result Value Ref Range Status   Specimen Description BLOOD LEFT ARM  Final   Special Requests   Final    BOTTLES DRAWN AEROBIC AND ANAEROBIC Blood Culture results may not be optimal due to an inadequate volume of blood received in culture bottles   Culture   Final    NO GROWTH 5 DAYS Performed at [facility], [address] [place], [place] [address]    Report Status 03/29/2024 FINAL  Final  Blood culture (routine x 2)     Status: None   Collection Time: 03/24/24  1:52 AM   Specimen: BLOOD RIGHT ARM  Result Value Ref Range Status   Specimen Description BLOOD RIGHT ARM  Final   Special Requests   Final    BOTTLES DRAWN AEROBIC AND ANAEROBIC Blood Culture results may not be optimal due  to an inadequate volume of blood received in culture bottles   Culture   Final    NO GROWTH 5 DAYS Performed at [facility], [address] [place], [place] [address]    Report Status 03/29/2024 FINAL  Final  Culture, Urine (Do not remove urinary catheter, catheter placed by urology or difficult to place)     Status: Abnormal   Collection Time: 03/24/24  1:55 AM   Specimen: Urine, Catheterized  Result Value Ref Range Status   Specimen Description   Final    URINE, CATHETERIZED Performed at [facility], [address] [place], [place] [address]    Special Requests   Final    NONE Performed at [facility], [address] [place], [place] [address]    Culture MULTIPLE SPECIES PRESENT, SUGGEST RECOLLECTION (A)  Final   Report Status 03/25/2024 FINAL  Final  Respiratory (~20 pathogens) panel by PCR     Status: None   Collection Time: 03/24/24 11:17 AM   Specimen: Nasopharyngeal Swab; Respiratory  Result Value Ref Range Status   Adenovirus NOT DETECTED NOT DETECTED Final   Coronavirus 229E NOT DETECTED NOT DETECTED Final    Comment: (NOTE) The Coronavirus on the Respiratory Panel, DOES NOT test for the novel  Coronavirus (2019 nCoV)    Coronavirus HKU1 NOT DETECTED NOT DETECTED Final   Coronavirus NL63 NOT DETECTED NOT DETECTED Final   Coronavirus OC43 NOT DETECTED NOT DETECTED Final   Metapneumovirus NOT DETECTED NOT DETECTED Final   Rhinovirus / Enterovirus NOT DETECTED NOT DETECTED Final   Influenza A NOT DETECTED NOT DETECTED Final   Influenza B NOT DETECTED NOT DETECTED Final   Parainfluenza Virus 1 NOT DETECTED NOT DETECTED Final   Parainfluenza Virus 2 NOT DETECTED NOT DETECTED Final   Parainfluenza Virus 3 NOT DETECTED NOT DETECTED Final   Parainfluenza Virus 4 NOT DETECTED NOT DETECTED Final   Respiratory Syncytial Virus NOT DETECTED NOT DETECTED Final   Bordetella pertussis NOT DETECTED NOT DETECTED Final   Bordetella Parapertussis  NOT DETECTED NOT DETECTED Final   Chlamydophila pneumoniae NOT DETECTED NOT DETECTED Final   Mycoplasma pneumoniae NOT DETECTED NOT DETECTED Final    Comment: Performed at [facility], [address], [place], [place] [address]  MRSA Next Gen by PCR, Nasal     Status: None   Collection Time: 03/25/24 12:45 PM   Specimen: Nasal Mucosa; Nasal Swab  Result Value Ref Range Status   MRSA by PCR Next Gen NOT DETECTED NOT DETECTED Final    Comment: (NOTE) The GeneXpert MRSA Assay (FDA approved for NASAL specimens only), is one component of a comprehensive MRSA colonization surveillance program. It is not intended to diagnose MRSA infection nor to guide or monitor treatment for MRSA infections. Test performance is not FDA approved in patients less than 67 years old. Performed at [facility], [address] [place], [place] [address]   Resp panel by RT-PCR (RSV, Flu A&B, Covid) Anterior Nasal Swab     Status: None   Collection Time: 03/29/24  4:45 PM   Specimen: Anterior Nasal Swab  Result Value Ref Range Status   SARS Coronavirus 2 by RT PCR NEGATIVE NEGATIVE Final    Comment: (NOTE) SARS-CoV-2 target nucleic acids are NOT DETECTED.  The SARS-CoV-2 RNA is generally detectable in upper respiratory specimens during the acute phase of infection. The lowest concentration of SARS-CoV-2 viral copies this assay can detect is 138 copies/mL. A negative result does  not preclude SARS-Cov-2 infection and should not be used as the sole basis for treatment or other patient management decisions. A negative result may occur with  improper specimen collection/handling, submission of specimen other than nasopharyngeal swab, presence of viral mutation(s) within the areas targeted by this assay, and inadequate number of viral copies(<138 copies/mL). A negative result must be combined with clinical observations, patient history, and epidemiological information. The expected result is  Negative.  Fact Sheet for Patients:  bloggercourse.com  Fact Sheet for Healthcare Providers:  seriousbroker.it  This test is no t yet approved or cleared by the [place]  FDA and  has been authorized for detection and/or diagnosis of SARS-CoV-2 by FDA under an Emergency Use Authorization (EUA). This EUA will remain  in effect (meaning this test can be used) for the duration of the COVID-19 declaration under Section 564(b)(1) of the Act, 21 U.S.C.section 360bbb-3(b)(1), unless the authorization is terminated  or revoked sooner.       Influenza A by PCR NEGATIVE NEGATIVE Final   Influenza B by PCR NEGATIVE NEGATIVE Final    Comment: (NOTE) The Xpert Xpress SARS-CoV-2/FLU/RSV plus assay is intended as an aid in the diagnosis of influenza from Nasopharyngeal swab specimens and should not be used as a sole basis for treatment. Nasal washings and aspirates are unacceptable for Xpert Xpress SARS-CoV-2/FLU/RSV testing.  Fact Sheet for Patients: bloggercourse.com  Fact Sheet for Healthcare Providers: seriousbroker.it  This test is not yet approved or cleared by the [place]  FDA and has been authorized for detection and/or diagnosis of SARS-CoV-2 by FDA under an Emergency Use Authorization (EUA). This EUA will remain in effect (meaning this test can be used) for the duration of the COVID-19 declaration under Section 564(b)(1) of the Act, 21 U.S.C. section 360bbb-3(b)(1), unless the authorization is terminated or revoked.     Resp Syncytial Virus by PCR NEGATIVE NEGATIVE Final    Comment: (NOTE) Fact Sheet for Patients: bloggercourse.com  Fact Sheet for Healthcare Providers: seriousbroker.it  This test is not yet approved or cleared by the [place]  FDA and has been authorized for detection and/or diagnosis of  SARS-CoV-2 by FDA under an Emergency Use Authorization (EUA). This EUA will remain in effect (meaning this test can be used) for the duration of the COVID-19 declaration under Section 564(b)(1) of the Act, 21 U.S.C. section 360bbb-3(b)(1), unless the authorization is terminated or revoked.  Performed at [facility], [address] [place], [place] [address]   Culture, blood (Routine X 2) w Reflex to ID Panel     Status: None (Preliminary result)   Collection Time: 03/29/24  6:23 PM   Specimen: BLOOD  Result Value Ref Range Status   Specimen Description BLOOD BLOOD RIGHT HAND  Final   Special Requests   Final    BOTTLES DRAWN AEROBIC AND ANAEROBIC Blood Culture results may not be optimal due to an inadequate volume of blood received in culture bottles   Culture   Final    NO GROWTH 4 DAYS Performed at [facility], [address] [place], [place] [address]    Report Status PENDING  Incomplete  Culture, blood (Routine X 2) w Reflex to ID Panel     Status: None (Preliminary result)   Collection Time: 03/29/24 11:16 PM   Specimen: BLOOD RIGHT HAND  Result Value Ref Range Status   Specimen Description BLOOD RIGHT HAND  Final   Special Requests   Final    BOTTLES DRAWN AEROBIC ONLY Blood Culture  adequate volume   Culture   Final    NO GROWTH 4 DAYS Performed at [facility], [address] [place], [place] [address]    Report Status PENDING  Incomplete  Respiratory (~20 pathogens) panel by PCR     Status: None   Collection Time: 03/31/24  9:00 AM   Specimen: Nasopharyngeal Swab; Respiratory  Result Value Ref Range Status   Adenovirus NOT DETECTED NOT DETECTED Final   Coronavirus 229E NOT DETECTED NOT DETECTED Final    Comment: (NOTE) The Coronavirus on the Respiratory Panel, DOES NOT test for the novel  Coronavirus (2019 nCoV)    Coronavirus HKU1 NOT DETECTED NOT DETECTED Final   Coronavirus NL63 NOT DETECTED NOT DETECTED Final   Coronavirus  OC43 NOT DETECTED NOT DETECTED Final   Metapneumovirus NOT DETECTED NOT DETECTED Final   Rhinovirus / Enterovirus NOT DETECTED NOT DETECTED Final   Influenza A NOT DETECTED NOT DETECTED Final   Influenza B NOT DETECTED NOT DETECTED Final   Parainfluenza Virus 1 NOT DETECTED NOT DETECTED Final   Parainfluenza Virus 2 NOT DETECTED NOT DETECTED Final   Parainfluenza Virus 3 NOT DETECTED NOT DETECTED Final   Parainfluenza Virus 4 NOT DETECTED NOT DETECTED Final   Respiratory Syncytial Virus NOT DETECTED NOT DETECTED Final   Bordetella pertussis NOT DETECTED NOT DETECTED Final   Bordetella Parapertussis NOT DETECTED NOT DETECTED Final   Chlamydophila pneumoniae NOT DETECTED NOT DETECTED Final   Mycoplasma pneumoniae NOT DETECTED NOT DETECTED Final    Comment: Performed at [facility], [address], [place], [place] [address]  CSF culture w Gram Stain     Status: None (Preliminary result)   Collection Time: 03/31/24 11:07 AM   Specimen: PATH Cytology CSF; Cerebrospinal Fluid  Result Value Ref Range Status   Specimen Description   Final    CSF Performed at [facility], [address], [place], [place] [address]    Special Requests   Final    C2 Performed at [facility], [address] [place], [place] [address]    Gram Stain   Final    WBC SEEN RED BLOOD CELLS NO ORGANISMS SEEN Performed at [facility], [address] [place], [place] [address]    Culture   Final    NO GROWTH 2 DAYS Performed at [facility], [address] [place], [place] [address]    Report Status PENDING  Incomplete    RADIOLOGY STUDIES/RESULTS: Overnight EEG with video Result Date: 04/02/2024 Shelton Arlin KIDD, MD     04/02/2024  9:02 AM Patient Name: Cambre Matson MRN: [id number] Epilepsy Attending: Arlin KIDD Shelton Referring Physician/Provider: Khaliqdina, Salman, MD Duration: 04/01/2024 1033 to 04/02/2024 0900 Patient history:  74 y.o. female with fevers of  unknown origin and worsening mental status in the setting of dementia and history of nonconvulsive seizures. She is on three antiepileptics, and unclear if she has been having seizures. EEG to evaluate for seizure Level of alertness: Awake, asleep AEDs during EEG study: LEV, VPA, LCM Technical aspects: This EEG study was done with scalp electrodes positioned according to the 10-20 International system of electrode placement. Electrical activity was reviewed with band pass filter of 1-70Hz , sensitivity of 7 uV/mm, display speed of 74mm/sec with a 60Hz  notched filter applied as appropriate. EEG data were recorded continuously and digitally stored.  Video monitoring was available and reviewed as appropriate. Description: The posterior dominant rhythm consists of 7.5 Hz activity of moderate voltage (  25-35 uV) seen predominantly in posterior head regions, symmetric and reactive to eye opening and eye closing. Sleep was characterized by vertex waves, sleep spindles (12 to 14 Hz), maximal frontocentral region. Hyperventilation and photic stimulation were not performed.   IMPRESSION: This study is within normal limits. No seizures or epileptiform discharges were seen throughout the recording. A normal interictal EEG does not exclude the diagnosis of epilepsy. Priyanka O Yadav   DG Chest Port 1V same Day Result Date: 04/01/2024 CLINICAL DATA:  Fever. EXAM: PORTABLE CHEST 1 VIEW COMPARISON:  03/29/2024 FINDINGS: Stable normal sized heart. Tortuous and partially calcified thoracic aorta. Decreased linear atelectasis in the left lower lung zone. The remainder of the lungs remain clear with normal vascularity. No acute bony abnormality. IMPRESSION: No acute abnormality.  Improved linear atelectasis on the left. Electronically Signed   By: Elspeth Bathe M.D.   On: 04/01/2024 15:18     LOS: 2 days   Donalda Applebaum, MD  [facility]    To contact the attending provider between 7A-7P or the covering provider during  after hours 7P-7A, please log into the web site [email or web address] and access using universal [place] password for that web site. If you do not have the password, please call the hospital operator.  04/02/2024, 11:25 AM    "

## 2024-04-02 NOTE — Progress Notes (Signed)
 NEUROLOGY CONSULT FOLLOW UP NOTE   Date of service: April 02, 2024 Patient Name: Alicia Brennan MRN:  [id number] DOB:  26-Jan-1951  Interval Hx/subjective   Sitting up in bed, knows she is at [facility]. Able to tell me her name. Confused to month, year. Follows simple commands.  Afebrile overnight. Continues on Unasyn .  Home seizure medications continued.  LTM EEG read shows no seizures.   Vitals   Vitals:   04/01/24 2130 04/01/24 2239 04/02/24 0129 04/02/24 0513  BP:  (!) 155/73 (!) 136/59 (!) 143/61  Pulse:  80 80 76  Resp:  20 19 18   Temp:   98.1 F (36.7 C) 98.6 F (37 C)  TempSrc: Axillary  Axillary Axillary  SpO2:  96% 96% 96%  Weight:      Height:         Body mass index is 26.11 kg/m.  Physical Exam    Constitutional: Appears well-developed, elderly.  Cardiovascular: Normal rate and regular rhythm.  Respiratory: Effort normal, non-labored breathing.   Neurologic Examination    Patient is lying in bed. She is able to tell me her name and birthday, that she is at [facility]. Unable to tell me the month, her age.  She intermittently follows very simple commands, unable to answer further orientation questions or follow complex commands.  She tracks examiner more consistently.  She has chronic right-sided weakness. Able to lift left arm off bed. Unable to lift either leg of bed, bilateral feet are swollen Bilateral upper extremity tremor like movement seen, this stops when patient's hands are held, this movement is improved since yesterday.   Medications Current Medications[1]  Labs and Diagnostic Imaging   CBC:  Recent Labs  Lab 04/01/24 0257 04/02/24 0341  WBC 9.8 7.5  HGB 11.9* 10.4*  HCT 37.7 32.3*  MCV 96.7 93.9  PLT 201 200    Basic Metabolic Panel:  Lab Results  Component Value Date   NA 145 04/02/2024   K 2.9 (L) 04/02/2024   CO2 23 04/02/2024   GLUCOSE 102 (H) 04/02/2024   BUN 15 04/02/2024   CREATININE 0.65 04/02/2024   CALCIUM 9.0  04/02/2024   GFRNONAA >60 04/02/2024   GFRAA >60 08/16/2019   Lipid Panel: No results found for: LDLCALC HgbA1c: No results found for: HGBA1C Urine Drug Screen:     Component Value Date/Time   LABOPIA NONE DETECTED 04/25/2015 0825   COCAINSCRNUR NONE DETECTED 04/25/2015 0825   LABBENZ NONE DETECTED 04/25/2015 0825   AMPHETMU NONE DETECTED 04/25/2015 0825   THCU NONE DETECTED 04/25/2015 0825   LABBARB NONE DETECTED 04/25/2015 0825    Alcohol  Level     Component Value Date/Time   ETH <15 09/11/2023 2335   INR  Lab Results  Component Value Date   INR 1.2 03/24/2024   APTT  Lab Results  Component Value Date   APTT 30 11/27/2021   AED levels:  Lab Results  Component Value Date   LAMOTRIGINE  6.1 01/02/2024   LEVETIRACETA 28.6 01/02/2024    LP labs: CSF Cell Count: Clear, WBC: 3, RBC: 20 CSF Culture: Negative M/E Panel: Negative Protein: 25, Glucose: 58   Resp Panel: Negative ProCal: 0.33 Blood Culture: Negative  Urine Culture: Multiple Species present, suggest recollection   CT Head without contrast(Personally reviewed):  No acute intracranial abnormality. Moderate global parenchymal volume loss, slightly advanced for age, with mild periventricular white matter changes likely due to small vessel ischemia. Mild ventriculomegaly, likely ex vacuo, unchanged.   MRI  Brain 03/27/2024 (Personally reviewed): No acute intracranial abnormality. Moderate chronic microvascular ischemic changes. Moderate generalized parenchymal volume loss with concordant prominence of the lateral ventricles. Moderate left mastoid effusion.  LTM EEG:  This study is within normal limits. No seizures or epileptiform discharges were seen throughout the recording.   Assessment   Alicia Brennan is a 74 y.o. female with fevers of unknown origin and worsening mental status in the setting of dementia and history of nonconvulsive seizures. She is on three antiepileptics, and unclear if she has  been having seizures. Recently taken off Depakote  due to hyperammonemia side effect.   LTM negative for seizures. Patient has improved mental status from admission, seems to be more at her baseline. Continue treatment for infections and electrolyte abnormalities. She should continue on her home AED regimen and follow-up with her outpatient neurologist.   Recommendations   - DC LTM EEG - continue home AED medications  - Conitnue vimpat  50 bid - Continue keppra  500mg  BID - Continue home Depakote   - follow up with her established outpatient neurologist, Alicia Brennan ______________________________________________________________________  Signed, Alicia JAYSON Likes, NP [facility]    I have seen the patient reviewed the above note.  She continues to improve, no further testing is needed from a neurology perspective, we continue her home antiepileptics.  Alicia Seals, MD [facility]   If 7pm- 7am, please page neurology on call as listed in AMION.     [1]  Current Facility-Administered Medications:    0.45 % sodium chloride  infusion, , Intravenous, Continuous, Ghimire, Donalda HERO, MD, Last Rate: 75 mL/hr at 04/01/24 1244, New Bag at 04/01/24 1244   acetaminophen  (TYLENOL ) tablet 650 mg, 650 mg, Oral, Q6H PRN **OR** acetaminophen  (TYLENOL ) suppository 650 mg, 650 mg, Rectal, Q6H PRN, Adefeso, Oladapo, DO   Ampicillin -Sulbactam (UNASYN ) 3 g in sodium chloride  0.9 % 100 mL IVPB, 3 g, Intravenous, Q6H, Perri Olam POUR, RPH, Last Rate: 200 mL/hr at 04/02/24 0256, 3 g at 04/02/24 0256   enoxaparin  (LOVENOX ) injection 40 mg, 40 mg, Subcutaneous, Q24H, Adefeso, Oladapo, DO, 40 mg at 04/01/24 1002   lacosamide  (VIMPAT ) 50 mg in sodium chloride  0.9 % 25 mL IVPB, 50 mg, Intravenous, Q12H, Sundil, Subrina, MD, Last Rate: 60 mL/hr at 04/02/24 0214, 50 mg at 04/02/24 0214   levETIRAcetam  (KEPPRA ) undiluted injection 500 mg, 500 mg, Intravenous, Q12H, Sundil, Subrina, MD, 500 mg  at 04/01/24 2311   ondansetron  (ZOFRAN ) tablet 4 mg, 4 mg, Oral, Q6H PRN **OR** ondansetron  (ZOFRAN ) injection 4 mg, 4 mg, Intravenous, Q6H PRN, Adefeso, Oladapo, DO   Oral care mouth rinse, 15 mL, Mouth Rinse, 4 times per day, Adefeso, Oladapo, DO, 15 mL at 04/01/24 1700   Oral care mouth rinse, 15 mL, Mouth Rinse, PRN, Adefeso, Oladapo, DO   pantoprazole  (PROTONIX ) EC tablet 40 mg, 40 mg, Oral, Daily, Adefeso, Oladapo, DO, 40 mg at 04/01/24 1235   sertraline  (ZOLOFT ) tablet 25 mg, 25 mg, Oral, Daily, Adefeso, Oladapo, DO, 25 mg at 04/01/24 1235   valproate (DEPACON ) 250 mg in dextrose  5 % 50 mL IVPB, 250 mg, Intravenous, Q8H, Adefeso, Oladapo, DO, Last Rate: 52.5 mL/hr at 04/02/24 0606, 250 mg at 04/02/24 [phone]

## 2024-04-02 NOTE — Procedures (Addendum)
 Patient Name: Alicia Brennan  MRN: [id number]  Epilepsy Attending: Arlin MALVA Krebs  Referring Physician/Provider: Khaliqdina, Salman, MD  Duration: 04/01/2024 1033 to 04/02/2024 1022  Patient history:  74 y.o. female with fevers of unknown origin and worsening mental status in the setting of dementia and history of nonconvulsive seizures. She is on three antiepileptics, and unclear if she has been having seizures. EEG to evaluate for seizure  Level of alertness: Awake, asleep  AEDs during EEG study: LEV, VPA, LCM  Technical aspects: This EEG study was done with scalp electrodes positioned according to the 10-20 International system of electrode placement. Electrical activity was reviewed with band pass filter of 1-70Hz , sensitivity of 7 uV/mm, display speed of 72mm/sec with a 60Hz  notched filter applied as appropriate. EEG data were recorded continuously and digitally stored.  Video monitoring was available and reviewed as appropriate.  Description: The posterior dominant rhythm consists of 7.5 Hz activity of moderate voltage (25-35 uV) seen predominantly in posterior head regions, symmetric and reactive to eye opening and eye closing. Sleep was characterized by vertex waves, sleep spindles (12 to 14 Hz), maximal frontocentral region. Hyperventilation and photic stimulation were not performed.     IMPRESSION: This study is within normal limits. No seizures or epileptiform discharges were seen throughout the recording.  A normal interictal EEG does not exclude the diagnosis of epilepsy.  Kailo Kosik O Tara Wich

## 2024-04-02 NOTE — Progress Notes (Signed)
 LTM VIDEO EEG discontinued - no skin breakdown at [facility].

## 2024-04-03 DIAGNOSIS — F0150 Vascular dementia without behavioral disturbance: Secondary | ICD-10-CM

## 2024-04-03 DIAGNOSIS — I10 Essential (primary) hypertension: Secondary | ICD-10-CM | POA: Diagnosis not present

## 2024-04-03 DIAGNOSIS — K219 Gastro-esophageal reflux disease without esophagitis: Secondary | ICD-10-CM | POA: Diagnosis not present

## 2024-04-03 DIAGNOSIS — J690 Pneumonitis due to inhalation of food and vomit: Secondary | ICD-10-CM

## 2024-04-03 DIAGNOSIS — G40919 Epilepsy, unspecified, intractable, without status epilepticus: Secondary | ICD-10-CM | POA: Diagnosis not present

## 2024-04-03 DIAGNOSIS — F39 Unspecified mood [affective] disorder: Secondary | ICD-10-CM | POA: Diagnosis not present

## 2024-04-03 LAB — CBC
HCT: 36.4 % (ref 36.0–46.0)
Hemoglobin: 11.4 g/dL — ABNORMAL LOW (ref 12.0–15.0)
MCH: 29.8 pg (ref 26.0–34.0)
MCHC: 31.3 g/dL (ref 30.0–36.0)
MCV: 95 fL (ref 80.0–100.0)
Platelets: 205 K/uL (ref 150–400)
RBC: 3.83 MIL/uL — ABNORMAL LOW (ref 3.87–5.11)
RDW: 14.1 % (ref 11.5–15.5)
WBC: 7.3 K/uL (ref 4.0–10.5)
nRBC: 0 % (ref 0.0–0.2)

## 2024-04-03 LAB — BASIC METABOLIC PANEL WITH GFR
Anion gap: 9 (ref 5–15)
BUN: 9 mg/dL (ref 8–23)
CO2: 25 mmol/L (ref 22–32)
Calcium: 9 mg/dL (ref 8.9–10.3)
Chloride: 112 mmol/L — ABNORMAL HIGH (ref 98–111)
Creatinine, Ser: 0.54 mg/dL (ref 0.44–1.00)
GFR, Estimated: >60 mL/min
Glucose, Bld: 101 mg/dL — ABNORMAL HIGH (ref 70–99)
Potassium: 3.5 mmol/L (ref 3.5–5.1)
Sodium: 146 mmol/L — ABNORMAL HIGH (ref 135–145)

## 2024-04-03 LAB — CSF CULTURE W GRAM STAIN: Culture: NO GROWTH

## 2024-04-03 MED ORDER — AMLODIPINE BESYLATE 5 MG PO TABS
5.0000 mg | ORAL_TABLET | Freq: Every day | ORAL | Status: DC
  Administered 2024-04-03 – 2024-04-11 (×9): 5 mg via ORAL
  Filled 2024-04-03 (×9): qty 1

## 2024-04-03 MED ORDER — POTASSIUM CHLORIDE IN NACL 20-0.45 MEQ/L-% IV SOLN
INTRAVENOUS | Status: DC
  Filled 2024-04-03 (×2): qty 1000

## 2024-04-03 MED ORDER — DIVALPROEX SODIUM 250 MG PO DR TAB
250.0000 mg | DELAYED_RELEASE_TABLET | Freq: Three times a day (TID) | ORAL | Status: DC
  Administered 2024-04-03 – 2024-04-04 (×3): 250 mg via ORAL
  Filled 2024-04-03 (×6): qty 1

## 2024-04-03 MED ORDER — LABETALOL HCL 5 MG/ML IV SOLN: 10.0000 mg | INTRAVENOUS | Status: DC | PRN

## 2024-04-03 MED ORDER — LACOSAMIDE 50 MG PO TABS
50.0000 mg | ORAL_TABLET | Freq: Two times a day (BID) | ORAL | Status: DC
  Administered 2024-04-03 – 2024-04-04 (×2): 50 mg via ORAL
  Filled 2024-04-03 (×2): qty 1

## 2024-04-03 MED ORDER — HYDRALAZINE HCL 20 MG/ML IJ SOLN
10.0000 mg | Freq: Four times a day (QID) | INTRAMUSCULAR | Status: DC | PRN
  Administered 2024-04-03 – 2024-04-07 (×2): 10 mg via INTRAVENOUS
  Filled 2024-04-03 (×3): qty 1

## 2024-04-03 MED ORDER — LEVETIRACETAM 500 MG PO TABS
500.0000 mg | ORAL_TABLET | Freq: Two times a day (BID) | ORAL | Status: DC
  Administered 2024-04-03 – 2024-04-04 (×2): 500 mg via ORAL
  Filled 2024-04-03: qty 1
  Filled 2024-04-03: qty 2

## 2024-04-03 MED ORDER — SODIUM CHLORIDE 0.9 % IV SOLN
3.0000 g | Freq: Four times a day (QID) | INTRAVENOUS | Status: AC
  Administered 2024-04-03 (×2): 3 g via INTRAVENOUS
  Filled 2024-04-03 (×2): qty 8

## 2024-04-03 MED ORDER — ENSURE PLUS HIGH PROTEIN PO LIQD
237.0000 mL | Freq: Two times a day (BID) | ORAL | Status: DC
  Administered 2024-04-04 – 2024-04-07 (×8): 237 mL via ORAL
  Administered 2024-04-08: 120 mL via ORAL
  Administered 2024-04-09 – 2024-04-23 (×23): 237 mL via ORAL
  Filled 2024-04-03: qty 237

## 2024-04-03 NOTE — Care Management Important Message (Signed)
 Important Message  Patient Details  Name: Alicia Brennan MRN: [id number] Date of Birth: 1951/03/07   Important Message Given:  Yes - Medicare IM     Claretta Deed 04/03/2024, 2:43 PM

## 2024-04-03 NOTE — Progress Notes (Signed)
 Initial Nutrition Assessment  DOCUMENTATION CODES:      INTERVENTION:   Add Ensure Plus High Protein po BID, each supplement provides 350 kcal and 20 grams of protein   Add Magic cup TID with meals, each supplement provides 290 kcal and 9 grams of protein   Add-on PHOS/Mg to monitor for refeeding to avoid arrhythmias   Monitor GOC discussion and modify plan of care as indicated  No PEG  No artificial nutrition support    NUTRITION DIAGNOSIS:  Inadequate oral intake related to lethargy/confusion as evidenced by meal completion < 25%.  GOAL:  Other (Comment) (focus on nutrition intake as it relates to comfort and avoiding electrolyte derangement)  MONITOR:  PO intake, Supplement acceptance, Labs  REASON FOR ASSESSMENT:  Consult Assessment of nutrition requirement/status  ASSESSMENT:   Pt with PMH significant for: dementia, seizure disorder, sarcoidosis of lung, HTN, arthritis, IBS and cancer (2008). Originally admitted to [facility] for AMS and fever. Work up was negative and patient transferred to [facility] for LTM EEG.  12/28 - 1/05 admitted to [facility] with work up unrevealing 1/05 - transfer to [facility] for LTM EEG  Patient work up has been largely unrevealing. GOC discussions ongoing. No aggressive interventions. No PEG or Cortrak. Focus on comfort, but not full comfort measures at this time.   Average Meal Intake 1/07: 20% x1 documented meal  No family at bedside to report on nutrition-related history. Per chart review, patient reported with poor PO intake for last 4-5 months, but even more so in last few weeks. MD spoke with patient's daughter around nutrition support in the setting of advanced dementia, as patient's daughter reportedly had questions around this. Have since decided against aggressive nutrition intervention. Continue Ensures and [employer]. Will briefly monitor for refeeding to avoid arrhythmias. PHOS/Mg add-on to this morning.  Intake remains variable as as acceptance of  medications. Mentation fluctuating.   Admit/Current Weight: 69 kg - weight transferred over with patient from [facility]  Per chart review, weight history inconsistent with multiple weights pulled forward and significant fluctuations. UBW 3-5 months ago appears to be around 89kg. Based off current weight, she has shown 16.9% weight loss in that time, which is considered clinically significant for the time frame and is in line with family-reported poor PO intake for last 4-5 months.   Meds:  pantoprazole   Labs: Na+ 146 (H) K+ 3.4>2.9>3.5 (wdl) CBGs 101-102 x24 hours  NUTRITION - FOCUSED PHYSICAL EXAM:  Flowsheet Row Most Recent Value  Orbital Region Mild depletion  Upper Arm Region Mild depletion  Thoracic and Lumbar Region No depletion  Buccal Region No depletion  Temple Region Mild depletion  Clavicle Bone Region Moderate depletion  Clavicle and Acromion Bone Region Mild depletion  Scapular Bone Region Mild depletion  Dorsal Hand Mild depletion  Patellar Region No depletion  Anterior Thigh Region No depletion  Posterior Calf Region Unable to assess  Edema (RD Assessment) Mild  Hair Reviewed  Eyes Reviewed  Mouth Reviewed  Skin Reviewed  Nails Reviewed    Diet Order:   Diet Order             Diet regular Room service appropriate? Yes with Assist; Fluid consistency: Thin  Diet effective now            EDUCATION NEEDS:   Not appropriate for education at this time  Skin:  Skin Assessment: Skin Integrity Issues: Skin Integrity Issues:: Stage II Stage II: medial buttocks  Last BM:  1/07 - type 2  x1  Height:  Ht Readings from Last 1 Encounters:  03/31/24 5' 4 (1.626 m)   Weight:  Wt Readings from Last 1 Encounters:  03/31/24 69 kg   Ideal Body Weight:  54.5 kg  BMI:  Body mass index is 26.11 kg/m.  Estimated Nutritional Needs:   Kcal:  1400-1600 kcals  Protein:  70-85g  Fluid:  1.4-1.6L/day  Blair Deaner MS, RD, LDN Registered Dietitian I Clinical  Nutrition RD Inpatient Contact Info in Amion

## 2024-04-03 NOTE — Progress Notes (Signed)
 Physical Therapy Treatment Patient Details Name: Alicia Brennan MRN: [id number] DOB: 1950/05/25 Today's Date: 04/03/2024   History of Present Illness 74 yo F adm to 12/29 [facility] with AMS fevere concerns for aspiration pending d/c on 1/4 that was stopped due to seizure and fever. Family requested transfer to [facility] for seizures PMH pulmonary sarcoidosis, IBS, OA, HTN, dementia, R foot drop, chronic R side weakness and seizures.    PT Comments  Pt with increased pain and tone noted in B LE and R UE when repositioning in bed to initiate session. Pt with difficulty tolerating light touch and passive ROM of B LE, groaning and closing eyes when passive movement is attempted. Pt soiled upon arrival, NT present to assist with peri care. Pt maxAx2 for rolling, holding self in side lying position on R side with minA but requiring mod-maxA to maintain side lying position on L due to being unable to hold railing because of increased tone in R UE. Pt declining further mobility or being placed in chair position after peri care. Will continue to progress mobility pending improved tolerance. Pt would benefit from continued PT services focused on bed mobility, seated balance, and transfers to progress functional mobility.    If plan is discharge home, recommend the following: Two people to help with walking and/or transfers;Two people to help with bathing/dressing/bathroom;Assistance with cooking/housework;Assistance with feeding;Direct supervision/assist for medications management;Direct supervision/assist for financial management;Assist for transportation;Help with stairs or ramp for entrance;Supervision due to cognitive status   Can travel by private vehicle     No  Equipment Recommendations  Rolling walker (2 wheels);BSC/3in1;Wheelchair (measurements PT);Wheelchair cushion (measurements PT);Hospital bed;Hoyer lift    Recommendations for Other Services       Precautions / Restrictions Precautions Precautions:  Fall Recall of Precautions/Restrictions: Impaired Precaution/Restrictions Comments: Seizure precautions Restrictions Weight Bearing Restrictions Per Provider Order: No     Mobility  Bed Mobility Overal bed mobility: Needs Assistance Bed Mobility: Rolling Rolling: Max assist, +2 for physical assistance, Used rails         General bed mobility comments: Heavy cues to reach for rails, able to hold with L UE when rolling to R. Does not attempt to reach for rail with R UE, increased tone noted. Rolling completed for peri care. TotalA to boost. Pt declining furhter mobility following peri care, can we try another day?. Offered to place in chair position for upright positioning, pt declined.    Transfers                        Ambulation/Gait                   Stairs             Wheelchair Mobility     Tilt Bed    Modified Rankin (Stroke Patients Only)       Balance       Sitting balance - Comments: Not assessed, pt declining attempt to sit EOB due to fatigue and level of pain.                                    Communication Communication Communication: Impaired Factors Affecting Communication: Difficulty expressing self;Reduced clarity of speech  Cognition Arousal: Alert Behavior During Therapy: Flat affect   PT - Cognitive impairments: Difficult to assess Difficult to assess due to: Impaired communication  PT - Cognition Comments: Attempts to answer questions, words are quiet and unclear. Mostly communicates with head shakes and groaning today. Following commands: Impaired Following commands impaired: Follows one step commands with increased time, Follows one step commands inconsistently    Cueing Cueing Techniques: Verbal cues, Gestural cues, Tactile cues, Visual cues  Exercises      General Comments        Pertinent Vitals/Pain Pain Assessment Pain Assessment: Faces Faces Pain  Scale: Hurts whole lot Pain Location: B LE Pain Descriptors / Indicators: Grimacing, Moaning Pain Intervention(s): Limited activity within patient's tolerance, Repositioned, Monitored during session    Home Living                          Prior Function            PT Goals (current goals can now be found in the care plan section) Progress towards PT goals: Not progressing toward goals - comment (Limited to rolling bed mobility this session, difficulty tolerating due to pain and fatigue)    Frequency    Min 2X/week      PT Plan      Co-evaluation              AM-PAC PT 6 Clicks Mobility   Outcome Measure  Help needed turning from your back to your side while in a flat bed without using bedrails?: A Lot Help needed moving from lying on your back to sitting on the side of a flat bed without using bedrails?: Total Help needed moving to and from a bed to a chair (including a wheelchair)?: Total Help needed standing up from a chair using your arms (e.g., wheelchair or bedside chair)?: Total Help needed to walk in hospital room?: Total Help needed climbing 3-5 steps with a railing? : Total 6 Click Score: 7    End of Session   Activity Tolerance: Patient limited by pain;Patient limited by fatigue Patient left: in bed;with call bell/phone within reach;with bed alarm set Nurse Communication: Mobility status;Need for lift equipment PT Visit Diagnosis: Unsteadiness on feet (R26.81);Muscle weakness (generalized) (M62.81);Other abnormalities of gait and mobility (R26.89)     Time: 8688-8658 PT Time Calculation (min) (ACUTE ONLY): 30 min  Charges:    $Therapeutic Activity: 23-37 mins PT General Charges $$ ACUTE PT VISIT: 1 Visit                     Sabra Morel, PT, DPT  Acute Rehabilitation Services         Office: [phone]      Sabra MARLA Morel 04/03/2024, 4:07 PM

## 2024-04-03 NOTE — Consult Note (Signed)
 "                                               Consultation Note Date: 04/03/2024   Patient Name: Alicia Brennan  DOB: 1950/07/26  MRN: [id number]  Age / Sex: 74 y.o., female  PCP: [name], Edra GRADE, MD Referring Physician: Raenelle Donalda HERO, MD  Reason for Consultation: Establishing goals of care  HPI/Patient Profile: 74 y.o. 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 Was at [facility] for fever; extensive work up was negative. Has not had fevers since 1/5. Suspicion she had aspiration pneumonitis. LTM EEG was negative. Overall has improved. Neurology recommended continuation of home AEDs. PMT consulted for GOC discussion.   Clinical Assessment and Goals of Care: I have reviewed medical records including EPIC notes, labs and imaging, received report from RN, Burnard, assessed the patient and then met with patients 2 daughters [name] and Dinelle with their brother [name] on the phone  to discuss diagnosis prognosis, GOC, EOL wishes, disposition and options.  I introduced Palliative Medicine as specialized medical care for people living with serious illness. It focuses on providing relief from the symptoms and stress of a serious illness. The goal is to improve quality of life for both the patient and the family.  Confirmed all 3 children are next of kin and serve as joint decision makers together.   Family tells me of patient's diagnosis of dementia in 2022 but no real follow up care or education about what that meant. They tell me she hasn't been walking for a while but has really declined since November. Has also had a poor appetite - drinks well and consumes ensures. They tell me she sleeps a lot and isnt as verbal as she used to be. Has good days/bad days.    We discussed patient's current  illness and what it means in the larger context of patient's on-going co-morbidities.  Natural disease trajectory and expectations at EOL were discussed.  We discussed that dementia is a chronic, progressive disease. We discussed expectations as the disease continues such as ongoing loss of function, loss of speech, poor appetite, potential trouble swallowing. We reviewed specific indicators that patient may have reached advanced stages, including decreased ability to communicate, bed bound/non-ambulatory status, and decreased oral intake.   I attempted to elicit values and goals of care important to the patient.    Encouraged family to consider DNR/DNI status understanding evidenced based poor outcomes in similar hospitalized patients, as the cause of the arrest is likely associated with chronic/terminal disease rather than a reversible acute cardio-pulmonary event.   Initially family expressed some uncertainty about this noting they would want attempts at resuscitation; but as conversation continued son shared that patient had previously told him she wouldn't want CPR and daughter notes patient had previously said she would not want to be on a ventilator. All three children agreed to DNR/DNI; allowing her to pass naturally whenever that time may be.   We also reviewed feeding tubes. We discussed that evidence has shown that PEG tubes in patients with advanced dementia do not increase survival, prevent aspiration, or improve wound healing.  Use of PEG tubes is not medically  recommended in this population; rather, careful hand feeding with aspiration precautions has been shown to provide the best quality of life. They agree with this and share they would not want to put her through a PEG tube.   The difference between aggressive medical intervention and comfort care was considered in light of the patient's goals of care. Family would like the focus on her care to be on her comfort, avoiding aggressive  medical care.   We reviewed that she has not been benefiting from rehab, not participating. Discussed whether or not continuing on rehab pathway makes sense, especially in light of goals to focus on comfort.   Hospice and Palliative Care services outpatient were explained and offered. Described each to them. Hospice is more in line with the support they desire for Ms. Wishes. Discussed hospice philosophy of care and type of support they provide. Family would love to have her home - son specifically is most considering this - but unfortunately, at this time, she needs 24/7 care and this is not feasible. They shares the need of a facility. We discussed option of LTC (no rehab) and hospice. Discussed will reach out to CSW to explore option.   Questions and concerns were addressed. The family was encouraged to call with questions or concerns.   Discussed wishes with Dr. Raenelle. Shared disposition preference with CSW Nadia.   Primary Decision Maker NEXT OF KIN - 3 adult children    SUMMARY OF RECOMMENDATIONS   Code status change to DNR/DNI Continue current measures to stabilize best we can for discharge Focus on comfort, avoid aggressive medical interventions Would not want PEG Hopeful to involve hospice support at LTC facility (not participating in rehab) They would love home with hospice but unable to have 24.7 support in home at this time  Code Status/Advance Care Planning: DNR  Discharge Planning: Skilled Nursing Facility with Hospice      Primary Diagnoses: Present on Admission:  Breakthrough seizure (HCC)   I have reviewed the medical record, interviewed the patient and family, and examined the patient. The following aspects are pertinent.  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   Foley catheter in place    Hypertension    IBS (irritable bowel syndrome)    Leg fracture, left    Lymphedema    Mood disturbance    Recurrent headache     Sarcoidosis of lung    Seizures (HCC)    Thrombocytopenia    Vascular dementia (HCC) 01/31/2024   Wound of buttock    Right, pressure wound   Social History   Socioeconomic History   Marital status: Single    Spouse name: Not on file   Number of children: 3   Years of education: Not on file   Highest education level: Not on file  Occupational History   Occupation: Retired  Tobacco Use   Smoking status: Never   Smokeless tobacco: Never  Vaping Use   Vaping status: Never Used  Substance and Sexual Activity   Alcohol  use: No    Alcohol /week: 0.0 standard drinks of alcohol    Drug use: No   Sexual activity: Not on file  Other Topics Concern   Not on file  Social History Narrative   Not on file   Social Drivers of Health   Tobacco Use: Low Risk (04/01/2024)   Patient History    Smoking Tobacco Use: Never    Smokeless Tobacco Use:  Never    Passive Exposure: Not on file  Financial Resource Strain: Low Risk (11/16/2023)   Received from [facility]   Overall Financial Resource Strain (CARDIA)    How hard is it for you to pay for the very basics like food, housing, medical care, and heating?: Not very hard  Food Insecurity: Patient Unable To Answer (03/31/2024)   Epic    Worried About Programme Researcher, Broadcasting/film/video in the Last Year: Patient unable to answer    Ran Out of Food in the Last Year: Patient unable to answer  Recent Concern: Food Insecurity - Food Insecurity Present (01/31/2024)   Epic    Worried About Programme Researcher, Broadcasting/film/video in the Last Year: Never true    Ran Out of Food in the Last Year: Sometimes true  Transportation Needs: No Transportation Needs (01/31/2024)   Epic    Lack of Transportation (Medical): No    Lack of Transportation (Non-Medical): No  Physical Activity: Not on file  Stress: Not on file  Social Connections: Unknown (03/31/2024)   Social Connection and Isolation Panel    Frequency of Communication with Friends and Family: Patient unable to answer     Frequency of Social Gatherings with Friends and Family: Patient unable to answer    Attends Religious Services: Not on file    Active Member of Clubs or Organizations: Patient unable to answer    Attends Banker Meetings: Patient unable to answer    Marital Status: Patient unable to answer  Recent Concern: Social Connections - Moderately Isolated (01/31/2024)   Social Connection and Isolation Panel    Frequency of Communication with Friends and Family: More than three times a week    Frequency of Social Gatherings with Friends and Family: More than three times a week    Attends Religious Services: Never    Database Administrator or Organizations: No    Attends Banker Meetings: Never    Marital Status: Married  Depression (PHQ2-9): Not on file  Alcohol  Screen: Not on file  Housing: Patient Unable To Answer (03/31/2024)   Epic    Unable to Pay for Housing in the Last Year: Patient unable to answer    Number of Times Moved in the Last Year: Not on file    Homeless in the Last Year: Patient unable to answer  Utilities: Not At Risk (01/31/2024)   Epic    Threatened with loss of utilities: No  Health Literacy: Not on file   Family History  Problem Relation Age of Onset   Dementia Mother    High blood pressure Father    Dementia Maternal Aunt    Sarcoidosis Son    Seizures Neg Hx    Scheduled Meds:  amLODipine   5 mg Oral Daily   divalproex   250 mg Oral Q8H   enoxaparin  (LOVENOX ) injection  40 mg Subcutaneous Q24H   lacosamide   50 mg Oral BID   levETIRAcetam   500 mg Oral BID   mouth rinse  15 mL Mouth Rinse 4 times per day   pantoprazole   40 mg Oral Daily   sertraline   25 mg Oral Daily   Continuous Infusions:  0.45 % NaCl with KCl 20 mEq / L 40 mL/hr at 04/03/24 1513   ampicillin -sulbactam (UNASYN ) IV 3 g (04/03/24 1627)   PRN Meds:.acetaminophen  **OR** acetaminophen , hydrALAZINE , labetalol , ondansetron  **OR** ondansetron  (ZOFRAN ) IV, mouth  rinse Allergies[1] Review of Systems  Unable to perform ROS: Dementia  Physical Exam Constitutional:      General: She is not in acute distress. Pulmonary:     Effort: Pulmonary effort is normal.  Neurological:     Mental Status: She is alert.     Vital Signs: BP (!) 149/75 (BP Location: Right Wrist)   Pulse 96   Temp 98.7 F (37.1 C) (Axillary)   Resp 20   Ht 5' 4 (1.626 m)   Wt 69 kg   SpO2 100%   BMI 26.11 kg/m  Pain Scale: 0-10   Pain Score: 0-No pain   SpO2: SpO2: 100 % O2 Device:SpO2: 100 % O2 Flow Rate: .   IO: Intake/output summary:  Intake/Output Summary (Last 24 hours) at 04/03/2024 1711 Last data filed at 04/03/2024 1339 Gross per 24 hour  Intake 873.34 ml  Output 2000 ml  Net -1126.66 ml    LBM: Last BM Date : 04/03/23 Baseline Weight: Weight: 69 kg Most recent weight: Weight: 69 kg     Palliative Assessment/Data: PPS 30%     *Please note that this is a verbal dictation therefore any spelling or grammatical errors are due to the Dragon Medical One system interpretation.   I personally spent a total of 85 minutes in the care of the patient today including preparing to see the patient, counseling and educating, placing orders, referring and communicating with other health care professionals, documenting clinical information in the EHR, and coordinating care.     Tobey Jama Barnacle, DNP, AGNP-C Palliative Medicine Team [phone] Pager: [phone]     [1] No Known Allergies  "

## 2024-04-03 NOTE — Progress Notes (Signed)
 "                        PROGRESS NOTE        PATIENT DETAILS Name: Alicia Brennan Age: 74 y.o. Sex: female Date of Birth: 23-Feb-1951 Admit Date: 03/31/2024 Admitting Physician Posey Maier, DO ERE:Qzops Tor Edra GRADE, MD  Brief Summary: Patient is a 74 y.o.  female with history of dementia, seizure disorder-who was transferred from [facility] for LTM EEG.  Significant events: 12/28-01/5>> hospitalization at [facility] for altered mental status-fever at [facility] workup negative-transferred to [facility] for LTM EEG. 01/5>> admit to TRH at [facility].  Significant studies: 12/28>> CT head: No acute intracranial abnormality 12/29>> CXR: No PNA 12/31>> RUE Doppler: No DVT 01/01>> MRI brain: No acute intracranial abnormality 01/03>> CXR: Left lingula subsegmental atelectasis.  Significant microbiology data: 12/28>> COVID/influenza/RSV PCR: Negative 12/29>> blood culture: No growth 12/29>> urine culture: Multiple species 12/29>> respiratory virus panel: Negative 01/03>> blood culture: No growth 01/03>> COVID/influenza/RSV PCR: Negative 01/05>> respiratory virus panel: Negative 01/05>> CSF meningitis panel: Negative 01/05>> CSF culture: Pending  Procedures: 1/5>> fluoroscopy-guided LP  Consults: Neurology  Subjective: No major issues overnight-more awake/alert this morning-eating some breakfast this morning.  Objective: Vitals: Blood pressure (!) 149/75, pulse 96, temperature 97.8 F (36.6 C), temperature source Oral, resp. rate 20, height 5' 4 (1.626 m), weight 69 kg, SpO2 100%.   Exam: Frail but not in any distress Chest: Clear to auscultation CVS: S1-S2 regular Abdomen: Soft nontender nondistended Generalized weakness but nonfocal exam.  Pertinent Labs/Radiology:    Latest Ref Rng & Units 04/03/2024    3:47 AM 04/02/2024    3:41 AM 04/01/2024    2:57 AM  CBC  WBC 4.0 - 10.5 K/uL 7.3  7.5  9.8   Hemoglobin 12.0 - 15.0 g/dL 88.5  89.5  88.0   Hematocrit 36.0 - 46.0 % 36.4   32.3  37.7   Platelets 150 - 400 K/uL 205  200  201     Lab Results  Component Value Date   NA 146 (H) 04/03/2024   K 3.5 04/03/2024   CL 112 (H) 04/03/2024   CO2 25 04/03/2024      Assessment/Plan: Fever Had episodes of fever at [facility] on 1/3-1/5-none since then. Has had extensive workup-see above-no source apparent Although UA suggestive of UTI-urine cultures nondiagnostic Suspicion for aspiration pneumonitis-in the setting of AMS-and now on Unasyn -EOT on 1/8.  Acute metabolic encephalopathy Thought to be secondary to infectious disease in the setting of fever-but concerned that patient could be having nonconvulsive seizures-LTM EEG negative for seizures. Overall improved-has underlying dementia-maintain delirium precautions.  Much more awake and alert this morning. Continue Keppra /Vimpat /Depakote .  Seizure disorder See above  Mood disorder Zoloft   GERD PPI  HTN BP stable but now creeping up Start amlodipine  Avoid Micardis given potential for poor oral intake and dehydration and AKI.  Hypokalemia Repleted.  Hypernatremia Likely secondary to poor oral intake Encourage oral intake Continue half-normal saline for another 24 hours.  Dementia Delirium precautions  Palliative care Remains a full code Long discussion with patient's daughter over the phone on 1/7-although at SNF-patient has refused PT/OT for several months-and has had very poor oral intake for around 4-5 months.  Currently-much worse than usual baseline with debility/deconditioning and even worse oral intake.  Daughter apparently was asking nursing staff for feeding tubes-after my conversation with patient's daughter-we have decided to first pursue palliative care conversation.  She understands that placing feeding tubes in  a  patient with advanced dementia-really does not prolong life nor does it provide any quality.  She will need to be sedated to keep the tube in-which increases the risk of aspiration.   After extensive discussion-we have decided to hold off for now-hydrate with IVF-treat underlying etiologies-follow clinical course and continue goals of care discussion  Pressure Ulcer: Agree with assessment as outlined below. Wound 03/24/24 1721 Pressure Injury Buttocks Medial Stage 2 -  Partial thickness loss of dermis presenting as a shallow open injury with a red, pink wound bed without slough. (Active)   Code status:   Code Status: Full Code   DVT Prophylaxis: enoxaparin  (LOVENOX ) injection 40 mg Start: 04/01/24 1000 SCDs Start: 04/01/24 0023   Family Communication:Daughter-[name] [phone] (cell)-[phone] (work)-updated 1/7   Disposition Plan: Status is: Inpatient Remains inpatient appropriate because: Severity of illness   Planned Discharge Destination:Skilled nursing facility   Diet: Diet Order             Diet regular Room service appropriate? Yes with Assist; Fluid consistency: Thin  Diet effective now                     Antimicrobial agents: Anti-infectives (From admission, onward)    Start     Dose/Rate Route Frequency Ordered Stop   04/01/24 1100  Ampicillin -Sulbactam (UNASYN ) 3 g in sodium chloride  0.9 % 100 mL IVPB        3 g 200 mL/hr over 30 Minutes Intravenous Every 6 hours 04/01/24 1003 04/06/24 1359   04/01/24 1000  cefTRIAXone  (ROCEPHIN ) 2 g in sodium chloride  0.9 % 100 mL IVPB  Status:  Discontinued        2 g 200 mL/hr over 30 Minutes Intravenous Every 12 hours 04/01/24 0137 04/01/24 0152   04/01/24 1000  acyclovir  (ZOVIRAX ) 700 mg in dextrose  5 % 100 mL IVPB  Status:  Discontinued        700 mg 114 mL/hr over 60 Minutes Intravenous Every 12 hours 04/01/24 0238 04/01/24 1002   04/01/24 1000  cefTRIAXone  (ROCEPHIN ) 2 g in sodium chloride  0.9 % 100 mL IVPB  Status:  Discontinued        2 g 200 mL/hr over 30 Minutes Intravenous Every 12 hours 04/01/24 0238 04/01/24 1002   04/01/24 1000  vancomycin  (VANCOCIN ) IVPB 1000 mg/200 mL premix   Status:  Discontinued        1,000 mg 200 mL/hr over 60 Minutes Intravenous Every 24 hours 04/01/24 0238 04/01/24 0307   04/01/24 1000  cefTRIAXone  (ROCEPHIN ) 2 g in sodium chloride  0.9 % 100 mL IVPB  Status:  Discontinued        2 g 200 mL/hr over 30 Minutes Intravenous Every 24 hours 04/01/24 0152 04/01/24 0259   04/01/24 0600  ampicillin  (OMNIPEN) 2 g in sodium chloride  0.9 % 100 mL IVPB  Status:  Discontinued        2 g 300 mL/hr over 20 Minutes Intravenous Every 6 hours 04/01/24 0238 04/01/24 1002   04/01/24 0307  vancomycin  variable dose per unstable renal function (pharmacist dosing)  Status:  Discontinued         Does not apply See admin instructions 04/01/24 0307 04/01/24 1002        MEDICATIONS: Scheduled Meds:  enoxaparin  (LOVENOX ) injection  40 mg Subcutaneous Q24H   levETIRAcetam   500 mg Intravenous Q12H   mouth rinse  15 mL Mouth Rinse 4 times per day   pantoprazole   40 mg Oral  Daily   sertraline   25 mg Oral Daily   Continuous Infusions:  ampicillin -sulbactam (UNASYN ) IV 3 g (04/03/24 1032)   lacosamide  (VIMPAT ) IV 50 mg (04/03/24 1150)   valproate (DEPACON ) 250 mg in dextrose  5 % 50 mL IVPB 250 mg (04/03/24 0638)   PRN Meds:.acetaminophen  **OR** acetaminophen , hydrALAZINE , labetalol , ondansetron  **OR** ondansetron  (ZOFRAN ) IV, mouth rinse   I have personally reviewed following labs and imaging studies  LABORATORY DATA: CBC: Recent Labs  Lab 04/01/24 0257 04/02/24 0341 04/03/24 0347  WBC 9.8 7.5 7.3  HGB 11.9* 10.4* 11.4*  HCT 37.7 32.3* 36.4  MCV 96.7 93.9 95.0  PLT 201 200 205    Basic Metabolic Panel: Recent Labs  Lab 03/31/24 0656 04/01/24 0257 04/02/24 0341 04/03/24 0347  NA 148* 146* 145 146*  K 3.8 3.4* 2.9* 3.5  CL 112* 112* 111 112*  CO2 24 22 23 25   GLUCOSE 103* 81 102* 101*  BUN 25* 23 15 9   CREATININE 1.17* 0.90 0.65 0.54  CALCIUM 9.4 9.5 9.0 9.0  MG  --  2.2  --   --   PHOS  --  3.1  --   --     GFR: Estimated Creatinine  Clearance: 57.9 mL/min (by C-G formula based on SCr of 0.54 mg/dL).  Liver Function Tests: Recent Labs  Lab 04/01/24 0257  AST 90*  ALT 41  ALKPHOS 80  BILITOT 0.4  PROT 7.0  ALBUMIN  2.7*   No results for input(s): LIPASE, AMYLASE in the last 168 hours. Recent Labs  Lab 04/01/24 0915  AMMONIA 26    Coagulation Profile: No results for input(s): INR, PROTIME in the last 168 hours.  Cardiac Enzymes: Recent Labs  Lab 03/30/24 2040  CKTOTAL 100    BNP (last 3 results) Recent Labs    12/06/23 1658  PROBNP 64.3    Lipid Profile: No results for input(s): CHOL, HDL, LDLCALC, TRIG, CHOLHDL, LDLDIRECT in the last 72 hours.  Thyroid Function Tests: Recent Labs    04/01/24 1146  TSH 3.190    Anemia Panel: Recent Labs    04/01/24 1146  VITAMINB12 703  FOLATE 10.9    Urine analysis:    Component Value Date/Time   COLORURINE YELLOW 03/31/2024 2112   APPEARANCEUR CLOUDY (A) 03/31/2024 2112   APPEARANCEUR Clear 05/17/2013 1525   LABSPEC 1.017 03/31/2024 2112   LABSPEC 1.019 05/17/2013 1525   PHURINE 6.0 03/31/2024 2112   GLUCOSEU NEGATIVE 03/31/2024 2112   GLUCOSEU Negative 05/17/2013 1525   HGBUR SMALL (A) 03/31/2024 2112   BILIRUBINUR NEGATIVE 03/31/2024 2112   BILIRUBINUR Negative 05/17/2013 1525   KETONESUR NEGATIVE 03/31/2024 2112   PROTEINUR 30 (A) 03/31/2024 2112   NITRITE NEGATIVE 03/31/2024 2112   LEUKOCYTESUR LARGE (A) 03/31/2024 2112   LEUKOCYTESUR Trace 05/17/2013 1525    Sepsis Labs: Lactic Acid, Venous    Component Value Date/Time   LATICACIDVEN 1.0 03/24/2024 0147    MICROBIOLOGY: Recent Results (from the past 240 hours)  MRSA Next Gen by PCR, Nasal     Status: None   Collection Time: 03/25/24 12:45 PM   Specimen: Nasal Mucosa; Nasal Swab  Result Value Ref Range Status   MRSA by PCR Next Gen NOT DETECTED NOT DETECTED Final    Comment: (NOTE) The GeneXpert MRSA Assay (FDA approved for NASAL specimens only), is  one component of a comprehensive MRSA colonization surveillance program. It is not intended to diagnose MRSA infection nor to guide or monitor treatment for MRSA infections. Test performance  is not FDA approved in patients less than 59 years old. Performed at [facility], [address] [place], [place] [address]   Resp panel by RT-PCR (RSV, Flu A&B, Covid) Anterior Nasal Swab     Status: None   Collection Time: 03/29/24  4:45 PM   Specimen: Anterior Nasal Swab  Result Value Ref Range Status   SARS Coronavirus 2 by RT PCR NEGATIVE NEGATIVE Final    Comment: (NOTE) SARS-CoV-2 target nucleic acids are NOT DETECTED.  The SARS-CoV-2 RNA is generally detectable in upper respiratory specimens during the acute phase of infection. The lowest concentration of SARS-CoV-2 viral copies this assay can detect is 138 copies/mL. A negative result does not preclude SARS-Cov-2 infection and should not be used as the sole basis for treatment or other patient management decisions. A negative result may occur with  improper specimen collection/handling, submission of specimen other than nasopharyngeal swab, presence of viral mutation(s) within the areas targeted by this assay, and inadequate number of viral copies(<138 copies/mL). A negative result must be combined with clinical observations, patient history, and epidemiological information. The expected result is Negative.  Fact Sheet for Patients:  bloggercourse.com  Fact Sheet for Healthcare Providers:  seriousbroker.it  This test is no t yet approved or cleared by the [place]  FDA and  has been authorized for detection and/or diagnosis of SARS-CoV-2 by FDA under an Emergency Use Authorization (EUA). This EUA will remain  in effect (meaning this test can be used) for the duration of the COVID-19 declaration under Section 564(b)(1) of the Act, 21 U.S.C.section 360bbb-3(b)(1),  unless the authorization is terminated  or revoked sooner.       Influenza A by PCR NEGATIVE NEGATIVE Final   Influenza B by PCR NEGATIVE NEGATIVE Final    Comment: (NOTE) The Xpert Xpress SARS-CoV-2/FLU/RSV plus assay is intended as an aid in the diagnosis of influenza from Nasopharyngeal swab specimens and should not be used as a sole basis for treatment. Nasal washings and aspirates are unacceptable for Xpert Xpress SARS-CoV-2/FLU/RSV testing.  Fact Sheet for Patients: bloggercourse.com  Fact Sheet for Healthcare Providers: seriousbroker.it  This test is not yet approved or cleared by the [place]  FDA and has been authorized for detection and/or diagnosis of SARS-CoV-2 by FDA under an Emergency Use Authorization (EUA). This EUA will remain in effect (meaning this test can be used) for the duration of the COVID-19 declaration under Section 564(b)(1) of the Act, 21 U.S.C. section 360bbb-3(b)(1), unless the authorization is terminated or revoked.     Resp Syncytial Virus by PCR NEGATIVE NEGATIVE Final    Comment: (NOTE) Fact Sheet for Patients: bloggercourse.com  Fact Sheet for Healthcare Providers: seriousbroker.it  This test is not yet approved or cleared by the [place]  FDA and has been authorized for detection and/or diagnosis of SARS-CoV-2 by FDA under an Emergency Use Authorization (EUA). This EUA will remain in effect (meaning this test can be used) for the duration of the COVID-19 declaration under Section 564(b)(1) of the Act, 21 U.S.C. section 360bbb-3(b)(1), unless the authorization is terminated or revoked.  Performed at [facility], [address] [place], [place] [address]   Culture, blood (Routine X 2) w Reflex to ID Panel     Status: None (Preliminary result)   Collection Time: 03/29/24  6:23 PM   Specimen: BLOOD  Result Value Ref  Range Status   Specimen Description BLOOD BLOOD RIGHT HAND  Final   Special Requests   Final  BOTTLES DRAWN AEROBIC AND ANAEROBIC Blood Culture results may not be optimal due to an inadequate volume of blood received in culture bottles   Culture   Final    NO GROWTH 4 DAYS Performed at [facility], [address] [place], [place] [address]    Report Status PENDING  Incomplete  Culture, blood (Routine X 2) w Reflex to ID Panel     Status: None (Preliminary result)   Collection Time: 03/29/24 11:16 PM   Specimen: BLOOD RIGHT HAND  Result Value Ref Range Status   Specimen Description BLOOD RIGHT HAND  Final   Special Requests   Final    BOTTLES DRAWN AEROBIC ONLY Blood Culture adequate volume   Culture   Final    NO GROWTH 4 DAYS Performed at [facility], [address], [place], [place] [address]    Report Status PENDING  Incomplete  Respiratory (~20 pathogens) panel by PCR     Status: None   Collection Time: 03/31/24  9:00 AM   Specimen: Nasopharyngeal Swab; Respiratory  Result Value Ref Range Status   Adenovirus NOT DETECTED NOT DETECTED Final   Coronavirus 229E NOT DETECTED NOT DETECTED Final    Comment: (NOTE) The Coronavirus on the Respiratory Panel, DOES NOT test for the novel  Coronavirus (2019 nCoV)    Coronavirus HKU1 NOT DETECTED NOT DETECTED Final   Coronavirus NL63 NOT DETECTED NOT DETECTED Final   Coronavirus OC43 NOT DETECTED NOT DETECTED Final   Metapneumovirus NOT DETECTED NOT DETECTED Final   Rhinovirus / Enterovirus NOT DETECTED NOT DETECTED Final   Influenza A NOT DETECTED NOT DETECTED Final   Influenza B NOT DETECTED NOT DETECTED Final   Parainfluenza Virus 1 NOT DETECTED NOT DETECTED Final   Parainfluenza Virus 2 NOT DETECTED NOT DETECTED Final   Parainfluenza Virus 3 NOT DETECTED NOT DETECTED Final   Parainfluenza Virus 4 NOT DETECTED NOT DETECTED Final   Respiratory Syncytial Virus NOT DETECTED NOT DETECTED Final   Bordetella  pertussis NOT DETECTED NOT DETECTED Final   Bordetella Parapertussis NOT DETECTED NOT DETECTED Final   Chlamydophila pneumoniae NOT DETECTED NOT DETECTED Final   Mycoplasma pneumoniae NOT DETECTED NOT DETECTED Final    Comment: Performed at [facility], [address], [place], [place] [address]  CSF culture w Gram Stain     Status: None   Collection Time: 03/31/24 11:07 AM   Specimen: PATH Cytology CSF; Cerebrospinal Fluid  Result Value Ref Range Status   Specimen Description   Final    CSF Performed at [facility], [address] [place], [place] [address]    Special Requests   Final    C2 Performed at [facility], [address] [place], [place] [address]    Gram Stain   Final    WBC SEEN RED BLOOD CELLS NO ORGANISMS SEEN Performed at [facility], [address] [place], [place] [address]    Culture   Final    NO GROWTH 3 DAYS Performed at [facility], [address], [place], [place] [address]    Report Status 04/03/2024 FINAL  Final    RADIOLOGY STUDIES/RESULTS: Overnight EEG with video Result Date: 04/02/2024 Shelton Arlin KIDD, MD     04/02/2024 11:36 AM Patient Name: Macee Venables MRN: [id number] Epilepsy Attending: Arlin KIDD Shelton Referring Physician/Provider: Khaliqdina, Salman, MD Duration: 04/01/2024 1033 to 04/02/2024 1022 Patient history:  74 y.o. female with fevers of unknown origin and worsening mental status  in the setting of dementia and history of nonconvulsive seizures. She is on three antiepileptics, and unclear if she has been having seizures. EEG to evaluate for seizure Level of alertness: Awake, asleep AEDs during EEG study: LEV, VPA, LCM Technical aspects: This EEG study was done with scalp electrodes positioned according to the 10-20 International system of electrode placement. Electrical activity was reviewed with band pass filter of 1-70Hz , sensitivity of 7 uV/mm, display speed of 21mm/sec with a 60Hz  notched  filter applied as appropriate. EEG data were recorded continuously and digitally stored.  Video monitoring was available and reviewed as appropriate. Description: The posterior dominant rhythm consists of 7.5 Hz activity of moderate voltage (25-35 uV) seen predominantly in posterior head regions, symmetric and reactive to eye opening and eye closing. Sleep was characterized by vertex waves, sleep spindles (12 to 14 Hz), maximal frontocentral region. Hyperventilation and photic stimulation were not performed.   IMPRESSION: This study is within normal limits. No seizures or epileptiform discharges were seen throughout the recording. A normal interictal EEG does not exclude the diagnosis of epilepsy. Priyanka MALVA Krebs     LOS: 3 days   Donalda Applebaum, MD  [facility]    To contact the attending provider between 7A-7P or the covering provider during after hours 7P-7A, please log into the web site [email or web address] and access using universal [place] password for that web site. If you do not have the password, please call the hospital operator.  04/03/2024, 1:38 PM    "

## 2024-04-04 DIAGNOSIS — F39 Unspecified mood [affective] disorder: Secondary | ICD-10-CM | POA: Diagnosis not present

## 2024-04-04 DIAGNOSIS — K219 Gastro-esophageal reflux disease without esophagitis: Secondary | ICD-10-CM | POA: Diagnosis not present

## 2024-04-04 DIAGNOSIS — G40919 Epilepsy, unspecified, intractable, without status epilepticus: Secondary | ICD-10-CM | POA: Diagnosis not present

## 2024-04-04 DIAGNOSIS — I10 Essential (primary) hypertension: Secondary | ICD-10-CM | POA: Diagnosis not present

## 2024-04-04 LAB — BASIC METABOLIC PANEL WITH GFR
Anion gap: 9 (ref 5–15)
BUN: 7 mg/dL — ABNORMAL LOW (ref 8–23)
CO2: 26 mmol/L (ref 22–32)
Calcium: 8.8 mg/dL — ABNORMAL LOW (ref 8.9–10.3)
Chloride: 109 mmol/L (ref 98–111)
Creatinine, Ser: 0.58 mg/dL (ref 0.44–1.00)
GFR, Estimated: >60 mL/min
Glucose, Bld: 101 mg/dL — ABNORMAL HIGH (ref 70–99)
Potassium: 3.6 mmol/L (ref 3.5–5.1)
Sodium: 144 mmol/L (ref 135–145)

## 2024-04-04 LAB — PHOSPHORUS: Phosphorus: 2.5 mg/dL (ref 2.5–4.6)

## 2024-04-04 LAB — MAGNESIUM: Magnesium: 1.8 mg/dL (ref 1.7–2.4)

## 2024-04-04 MED ORDER — SODIUM CHLORIDE 0.9 % IV SOLN
50.0000 mg | Freq: Once | INTRAVENOUS | Status: AC
  Administered 2024-04-04: 50 mg via INTRAVENOUS
  Filled 2024-04-04: qty 5

## 2024-04-04 MED ORDER — DIVALPROEX SODIUM 250 MG PO DR TAB
250.0000 mg | DELAYED_RELEASE_TABLET | Freq: Three times a day (TID) | ORAL | Status: DC
  Administered 2024-04-05 – 2024-04-23 (×53): 250 mg via ORAL
  Filled 2024-04-04 (×58): qty 1

## 2024-04-04 MED ORDER — POTASSIUM CHLORIDE IN NACL 20-0.45 MEQ/L-% IV SOLN
INTRAVENOUS | Status: AC
  Filled 2024-04-04: qty 1000

## 2024-04-04 MED ORDER — VALPROATE SODIUM 100 MG/ML IV SOLN
250.0000 mg | Freq: Three times a day (TID) | INTRAVENOUS | Status: AC
  Administered 2024-04-05 (×2): 250 mg via INTRAVENOUS
  Filled 2024-04-04 (×2): qty 2.5
  Filled 2024-04-04: qty 250

## 2024-04-04 MED ORDER — LEVETIRACETAM (KEPPRA) 500 MG/5 ML ADULT IV PUSH
500.0000 mg | Freq: Once | INTRAVENOUS | Status: AC
  Administered 2024-04-04: 500 mg via INTRAVENOUS
  Filled 2024-04-04: qty 5

## 2024-04-04 MED ORDER — LEVETIRACETAM 500 MG PO TABS
500.0000 mg | ORAL_TABLET | Freq: Two times a day (BID) | ORAL | Status: DC
  Administered 2024-04-05 – 2024-04-23 (×36): 500 mg via ORAL
  Filled 2024-04-04 (×3): qty 1
  Filled 2024-04-04: qty 2
  Filled 2024-04-04 (×32): qty 1

## 2024-04-04 MED ORDER — LACOSAMIDE 50 MG PO TABS
50.0000 mg | ORAL_TABLET | Freq: Two times a day (BID) | ORAL | Status: DC
  Administered 2024-04-05 – 2024-04-23 (×36): 50 mg via ORAL
  Filled 2024-04-04 (×36): qty 1

## 2024-04-04 NOTE — TOC Progression Note (Addendum)
 Transition of Care [facility]) - Progression Note    Patient Details  Name: Alicia Brennan MRN: [id number] Date of Birth: February 20, 1951  [facility] CM/SW Contact  [name], LCSW Phone Number: 04/04/2024, 10:00 AM  Clinical Narrative:    CSW contacting SNF facilities that had accepted for rehab to see if any would be willing to accept patient under her Medicaid ltc with hospice.   -[facility] only have one in [place].  -[facility] is full. [facility] does not have beds at the moment.  -[facility] able to accept clinically and hope to have a bed next week. CSW updated patient's daughter who stated she would like to do research and let CSW know.   -Requested [employer] and [employer] review.   -[employer] [employer] stated patient would need to pay privately until they switch her Medicaid.  -CSW updated daughter that [facility] [place] requires patient's monthly liability to be paid up front, $1889. Daughter will speak with her siblings but she stated patient does not receive check again until 2/1.     Expected Discharge Plan: Skilled Nursing Facility Barriers to Discharge: Insurance Authorization, SNF Pending bed offer, Continued Medical Work up               Expected Discharge Plan and Services In-house Referral: Clinical Social Work   Post Acute Care Choice: Skilled Nursing Facility Living arrangements for the past 2 months: Skilled Nursing Facility                                       Social Drivers of Health (SDOH) Interventions SDOH Screenings   Food Insecurity: Patient Unable To Answer (03/31/2024)  Recent Concern: Food Insecurity - Food Insecurity Present (01/31/2024)  Housing: Patient Unable To Answer (03/31/2024)  Transportation Needs: No Transportation Needs (01/31/2024)  Utilities: Not At Risk (01/31/2024)  Financial Resource Strain: Low Risk (11/16/2023)   [facility] from [facility]  Social Connections: Unknown (03/31/2024)  Recent Concern:  Social Connections - Moderately Isolated (01/31/2024)  Tobacco Use: Low Risk (04/01/2024)    Readmission Risk Interventions     No data to display

## 2024-04-04 NOTE — Progress Notes (Signed)
 "                        PROGRESS NOTE        PATIENT DETAILS Name: Alicia Brennan Age: 74 y.o. Sex: female Date of Birth: 1950/08/07 Admit Date: 03/31/2024 Admitting Physician Posey Maier, DO ERE:Qzops Tor Edra GRADE, MD  Brief Summary: Patient is a 74 y.o.  female with history of dementia, seizure disorder-who was transferred from [facility] for LTM EEG.  Significant events: 12/28-01/5>> hospitalization at [facility] for altered mental status-fever at [facility] workup negative-transferred to [facility] for LTM EEG. 01/5>> admit to TRH at [facility].  Significant studies: 12/28>> CT head: No acute intracranial abnormality 12/29>> CXR: No PNA 12/31>> RUE Doppler: No DVT 01/01>> MRI brain: No acute intracranial abnormality 01/03>> CXR: Left lingula subsegmental atelectasis.  Significant microbiology data: 12/28>> COVID/influenza/RSV PCR: Negative 12/29>> blood culture: No growth 12/29>> urine culture: Multiple species 12/29>> respiratory virus panel: Negative 01/03>> blood culture: No growth 01/03>> COVID/influenza/RSV PCR: Negative 01/05>> respiratory virus panel: Negative 01/05>> CSF meningitis panel: Negative 01/05>> CSF culture: Pending  Procedures: 1/5>> fluoroscopy-guided LP  Consults: Neurology Palliative  Subjective: No major issues overnight-pleasantly confused this morning.  Objective: Vitals: Blood pressure 139/73, pulse 86, temperature 98.1 F (36.7 C), temperature source Oral, resp. rate (!) 23, height 5' 4 (1.626 m), weight 69 kg, SpO2 97%.   Exam: Pleasantly confused-very frail appearing Chest: Clear to auscultation CVS: S1-S2 regular Abdomen: Soft nontender nondistended Extremities: No edema  Pertinent Labs/Radiology:    Latest Ref Rng & Units 04/03/2024    3:47 AM 04/02/2024    3:41 AM 04/01/2024    2:57 AM  CBC  WBC 4.0 - 10.5 K/uL 7.3  7.5  9.8   Hemoglobin 12.0 - 15.0 g/dL 88.5  89.5  88.0   Hematocrit 36.0 - 46.0 % 36.4  32.3  37.7   Platelets 150 -  400 K/uL 205  200  201     Lab Results  Component Value Date   NA 144 04/04/2024   K 3.6 04/04/2024   CL 109 04/04/2024   CO2 26 04/04/2024      Assessment/Plan: Fever Had episodes of fever at [facility] on 1/3-1/5-none since then. Has had extensive workup-see above-no source apparent Although UA suggestive of UTI-urine cultures nondiagnostic Suspicion for aspiration pneumonitis-in the setting of AMS-completed a course of Unasyn  on 1/8.   Acute metabolic encephalopathy Thought to be secondary to infectious disease in the setting of fever-but concerned that patient could be having nonconvulsive seizures-LTM EEG negative for seizures. Overall improved-has underlying dementia-maintain delirium precautions.  Continue Keppra /Vimpat /Depakote .  Seizure disorder See above  Mood disorder Zoloft   GERD PPI  HTN BP stable  Continue amlodipine . Avoid Micardis given potential for poor oral intake and dehydration and AKI.  Hypokalemia Repleted.  Hypernatremia Likely secondary to poor oral intake Encourage oral intake Continue half-normal saline for another 24 hours.  Dementia Delirium precautions  Palliative care Long discussion with daughter on 1/7-understands poor overall prognosis-we discussed how tube feeds really do not improve quality of life and longevity.  Subsequently evaluated by palliative care on 1/8-DNR-no tube feeds-SNF with hospice/palliative follow-up planned.   Pressure Ulcer: Agree with assessment as outlined below. Wound 03/24/24 1721 Pressure Injury Buttocks Medial Stage 2 -  Partial thickness loss of dermis presenting as a shallow open injury with a red, pink wound bed without slough. (Active)   Code status:   Code Status: Limited: Do not attempt resuscitation (DNR) -DNR-LIMITED -Do  Not Intubate/DNI    DVT Prophylaxis: enoxaparin  (LOVENOX ) injection 40 mg Start: 04/01/24 1000 SCDs Start: 04/01/24 0023   Family Communication:Daughter-[name] [phone]  (cell)-[phone] (work)-updated 1/7   Disposition Plan: Status is: Inpatient Remains inpatient appropriate because: Severity of illness   Planned Discharge Destination:Skilled nursing facility   Diet: Diet Order             Diet regular Room service appropriate? Yes with Assist; Fluid consistency: Thin  Diet effective now                     Antimicrobial agents: Anti-infectives (From admission, onward)    Start     Dose/Rate Route Frequency Ordered Stop   04/03/24 1400  Ampicillin -Sulbactam (UNASYN ) 3 g in sodium chloride  0.9 % 100 mL IVPB        3 g 200 mL/hr over 30 Minutes Intravenous Every 6 hours 04/03/24 1340 04/03/24 2144   04/01/24 1100  Ampicillin -Sulbactam (UNASYN ) 3 g in sodium chloride  0.9 % 100 mL IVPB  Status:  Discontinued        3 g 200 mL/hr over 30 Minutes Intravenous Every 6 hours 04/01/24 1003 04/03/24 1340   04/01/24 1000  cefTRIAXone  (ROCEPHIN ) 2 g in sodium chloride  0.9 % 100 mL IVPB  Status:  Discontinued        2 g 200 mL/hr over 30 Minutes Intravenous Every 12 hours 04/01/24 0137 04/01/24 0152   04/01/24 1000  acyclovir  (ZOVIRAX ) 700 mg in dextrose  5 % 100 mL IVPB  Status:  Discontinued        700 mg 114 mL/hr over 60 Minutes Intravenous Every 12 hours 04/01/24 0238 04/01/24 1002   04/01/24 1000  cefTRIAXone  (ROCEPHIN ) 2 g in sodium chloride  0.9 % 100 mL IVPB  Status:  Discontinued        2 g 200 mL/hr over 30 Minutes Intravenous Every 12 hours 04/01/24 0238 04/01/24 1002   04/01/24 1000  vancomycin  (VANCOCIN ) IVPB 1000 mg/200 mL premix  Status:  Discontinued        1,000 mg 200 mL/hr over 60 Minutes Intravenous Every 24 hours 04/01/24 0238 04/01/24 0307   04/01/24 1000  cefTRIAXone  (ROCEPHIN ) 2 g in sodium chloride  0.9 % 100 mL IVPB  Status:  Discontinued        2 g 200 mL/hr over 30 Minutes Intravenous Every 24 hours 04/01/24 0152 04/01/24 0259   04/01/24 0600  ampicillin  (OMNIPEN) 2 g in sodium chloride  0.9 % 100 mL IVPB  Status:   Discontinued        2 g 300 mL/hr over 20 Minutes Intravenous Every 6 hours 04/01/24 0238 04/01/24 1002   04/01/24 0307  vancomycin  variable dose per unstable renal function (pharmacist dosing)  Status:  Discontinued         Does not apply See admin instructions 04/01/24 0307 04/01/24 1002        MEDICATIONS: Scheduled Meds:  amLODipine   5 mg Oral Daily   divalproex   250 mg Oral Q8H   enoxaparin  (LOVENOX ) injection  40 mg Subcutaneous Q24H   feeding supplement  237 mL Oral BID BM   lacosamide   50 mg Oral BID   levETIRAcetam   500 mg Oral BID   mouth rinse  15 mL Mouth Rinse 4 times per day   pantoprazole   40 mg Oral Daily   sertraline   25 mg Oral Daily   Continuous Infusions:  0.45 % NaCl with KCl 20 mEq / L 40 mL/hr at  04/03/24 1858   PRN Meds:.acetaminophen  **OR** acetaminophen , hydrALAZINE , labetalol , ondansetron  **OR** ondansetron  (ZOFRAN ) IV, mouth rinse   I have personally reviewed following labs and imaging studies  LABORATORY DATA: CBC: Recent Labs  Lab 04/01/24 0257 04/02/24 0341 04/03/24 0347  WBC 9.8 7.5 7.3  HGB 11.9* 10.4* 11.4*  HCT 37.7 32.3* 36.4  MCV 96.7 93.9 95.0  PLT 201 200 205    Basic Metabolic Panel: Recent Labs  Lab 03/31/24 0656 04/01/24 0257 04/02/24 0341 04/03/24 0347 04/04/24 0256  NA 148* 146* 145 146* 144  K 3.8 3.4* 2.9* 3.5 3.6  CL 112* 112* 111 112* 109  CO2 24 22 23 25 26   GLUCOSE 103* 81 102* 101* 101*  BUN 25* 23 15 9  7*  CREATININE 1.17* 0.90 0.65 0.54 0.58  CALCIUM 9.4 9.5 9.0 9.0 8.8*  MG  --  2.2  --   --  1.8  PHOS  --  3.1  --   --  2.5    GFR: Estimated Creatinine Clearance: 57.9 mL/min (by C-G formula based on SCr of 0.58 mg/dL).  Liver Function Tests: Recent Labs  Lab 04/01/24 0257  AST 90*  ALT 41  ALKPHOS 80  BILITOT 0.4  PROT 7.0  ALBUMIN  2.7*   No results for input(s): LIPASE, AMYLASE in the last 168 hours. Recent Labs  Lab 04/01/24 0915  AMMONIA 26    Coagulation Profile: No  results for input(s): INR, PROTIME in the last 168 hours.  Cardiac Enzymes: Recent Labs  Lab 03/30/24 2040  CKTOTAL 100    BNP (last 3 results) Recent Labs    12/06/23 1658  PROBNP 64.3    Lipid Profile: No results for input(s): CHOL, HDL, LDLCALC, TRIG, CHOLHDL, LDLDIRECT in the last 72 hours.  Thyroid Function Tests: No results for input(s): TSH, T4TOTAL, FREET4, T3FREE, THYROIDAB in the last 72 hours.   Anemia Panel: No results for input(s): VITAMINB12, FOLATE, FERRITIN, TIBC, IRON, RETICCTPCT in the last 72 hours.   Urine analysis:    Component Value Date/Time   COLORURINE YELLOW 03/31/2024 2112   APPEARANCEUR CLOUDY (A) 03/31/2024 2112   APPEARANCEUR Clear 05/17/2013 1525   LABSPEC 1.017 03/31/2024 2112   LABSPEC 1.019 05/17/2013 1525   PHURINE 6.0 03/31/2024 2112   GLUCOSEU NEGATIVE 03/31/2024 2112   GLUCOSEU Negative 05/17/2013 1525   HGBUR SMALL (A) 03/31/2024 2112   BILIRUBINUR NEGATIVE 03/31/2024 2112   BILIRUBINUR Negative 05/17/2013 1525   KETONESUR NEGATIVE 03/31/2024 2112   PROTEINUR 30 (A) 03/31/2024 2112   NITRITE NEGATIVE 03/31/2024 2112   LEUKOCYTESUR LARGE (A) 03/31/2024 2112   LEUKOCYTESUR Trace 05/17/2013 1525    Sepsis Labs: Lactic Acid, Venous    Component Value Date/Time   LATICACIDVEN 1.0 03/24/2024 0147    MICROBIOLOGY: Recent Results (from the past 240 hours)  Resp panel by RT-PCR (RSV, Flu A&B, Covid) Anterior Nasal Swab     Status: None   Collection Time: 03/29/24  4:45 PM   Specimen: Anterior Nasal Swab  Result Value Ref Range Status   SARS Coronavirus 2 by RT PCR NEGATIVE NEGATIVE Final    Comment: (NOTE) SARS-CoV-2 target nucleic acids are NOT DETECTED.  The SARS-CoV-2 RNA is generally detectable in upper respiratory specimens during the acute phase of infection. The lowest concentration of SARS-CoV-2 viral copies this assay can detect is 138 copies/mL. A negative result does not  preclude SARS-Cov-2 infection and should not be used as the sole basis for treatment or other patient management decisions. A negative  result may occur with  improper specimen collection/handling, submission of specimen other than nasopharyngeal swab, presence of viral mutation(s) within the areas targeted by this assay, and inadequate number of viral copies(<138 copies/mL). A negative result must be combined with clinical observations, patient history, and epidemiological information. The expected result is Negative.  Fact Sheet for Patients:  bloggercourse.com  Fact Sheet for Healthcare Providers:  seriousbroker.it  This test is no t yet approved or cleared by the [place]  FDA and  has been authorized for detection and/or diagnosis of SARS-CoV-2 by FDA under an Emergency Use Authorization (EUA). This EUA will remain  in effect (meaning this test can be used) for the duration of the COVID-19 declaration under Section 564(b)(1) of the Act, 21 U.S.C.section 360bbb-3(b)(1), unless the authorization is terminated  or revoked sooner.       Influenza A by PCR NEGATIVE NEGATIVE Final   Influenza B by PCR NEGATIVE NEGATIVE Final    Comment: (NOTE) The Xpert Xpress SARS-CoV-2/FLU/RSV plus assay is intended as an aid in the diagnosis of influenza from Nasopharyngeal swab specimens and should not be used as a sole basis for treatment. Nasal washings and aspirates are unacceptable for Xpert Xpress SARS-CoV-2/FLU/RSV testing.  Fact Sheet for Patients: bloggercourse.com  Fact Sheet for Healthcare Providers: seriousbroker.it  This test is not yet approved or cleared by the [place]  FDA and has been authorized for detection and/or diagnosis of SARS-CoV-2 by FDA under an Emergency Use Authorization (EUA). This EUA will remain in effect (meaning this test can be used) for the  duration of the COVID-19 declaration under Section 564(b)(1) of the Act, 21 U.S.C. section 360bbb-3(b)(1), unless the authorization is terminated or revoked.     Resp Syncytial Virus by PCR NEGATIVE NEGATIVE Final    Comment: (NOTE) Fact Sheet for Patients: bloggercourse.com  Fact Sheet for Healthcare Providers: seriousbroker.it  This test is not yet approved or cleared by the [place]  FDA and has been authorized for detection and/or diagnosis of SARS-CoV-2 by FDA under an Emergency Use Authorization (EUA). This EUA will remain in effect (meaning this test can be used) for the duration of the COVID-19 declaration under Section 564(b)(1) of the Act, 21 U.S.C. section 360bbb-3(b)(1), unless the authorization is terminated or revoked.  Performed at [facility], [address] [place], [place] [address]   Culture, blood (Routine X 2) w Reflex to ID Panel     Status: None (Preliminary result)   Collection Time: 03/29/24  6:23 PM   Specimen: BLOOD  Result Value Ref Range Status   Specimen Description BLOOD BLOOD RIGHT HAND  Final   Special Requests   Final    BOTTLES DRAWN AEROBIC AND ANAEROBIC Blood Culture results may not be optimal due to an inadequate volume of blood received in culture bottles   Culture   Final    NO GROWTH 4 DAYS Performed at [facility], [address] [place], [place] [address]    Report Status PENDING  Incomplete  Culture, blood (Routine X 2) w Reflex to ID Panel     Status: None (Preliminary result)   Collection Time: 03/29/24 11:16 PM   Specimen: BLOOD RIGHT HAND  Result Value Ref Range Status   Specimen Description BLOOD RIGHT HAND  Final   Special Requests   Final    BOTTLES DRAWN AEROBIC ONLY Blood Culture adequate volume   Culture   Final    NO GROWTH 4 DAYS Performed at [facility], [address] [place], [place] [address]    Report Status PENDING   Incomplete  Respiratory (~20 pathogens) panel by PCR     Status: None   Collection Time: 03/31/24  9:00 AM   Specimen: Nasopharyngeal Swab; Respiratory  Result Value Ref Range Status   Adenovirus NOT DETECTED NOT DETECTED Final   Coronavirus 229E NOT DETECTED NOT DETECTED Final    Comment: (NOTE) The Coronavirus on the Respiratory Panel, DOES NOT test for the novel  Coronavirus (2019 nCoV)    Coronavirus HKU1 NOT DETECTED NOT DETECTED Final   Coronavirus NL63 NOT DETECTED NOT DETECTED Final   Coronavirus OC43 NOT DETECTED NOT DETECTED Final   Metapneumovirus NOT DETECTED NOT DETECTED Final   Rhinovirus / Enterovirus NOT DETECTED NOT DETECTED Final   Influenza A NOT DETECTED NOT DETECTED Final   Influenza B NOT DETECTED NOT DETECTED Final   Parainfluenza Virus 1 NOT DETECTED NOT DETECTED Final   Parainfluenza Virus 2 NOT DETECTED NOT DETECTED Final   Parainfluenza Virus 3 NOT DETECTED NOT DETECTED Final   Parainfluenza Virus 4 NOT DETECTED NOT DETECTED Final   Respiratory Syncytial Virus NOT DETECTED NOT DETECTED Final   Bordetella pertussis NOT DETECTED NOT DETECTED Final   Bordetella Parapertussis NOT DETECTED NOT DETECTED Final   Chlamydophila pneumoniae NOT DETECTED NOT DETECTED Final   Mycoplasma pneumoniae NOT DETECTED NOT DETECTED Final    Comment: Performed at [facility], [address], [place], [place] [address]  CSF culture w Gram Stain     Status: None   Collection Time: 03/31/24 11:07 AM   Specimen: PATH Cytology CSF; Cerebrospinal Fluid  Result Value Ref Range Status   Specimen Description   Final    CSF Performed at [facility], [address] [place], [place] [address]    Special Requests   Final    C2 Performed at [facility], [address] [place], [place] [address]    Gram Stain   Final    WBC SEEN RED BLOOD CELLS NO ORGANISMS SEEN Performed at [facility], [address], [place], [place] [address]    Culture    Final    NO GROWTH 3 DAYS Performed at [facility], [address], [place], [place] [address]    Report Status 04/03/2024 FINAL  Final  Fungus Culture With Stain     Status: None (Preliminary result)   Collection Time: 03/31/24 11:07 AM   Specimen: PATH Cytology CSF; Cerebrospinal Fluid  Result Value Ref Range Status   Fungus Stain Final report  Final    Comment: (NOTE) Performed At: [facility] [address] [place], [place] [id number] Jennette Shorter MD Ey:[phone]    Fungus (Mycology) Culture PENDING  Incomplete   Fungal Source CSF  Final    Comment: TUBE 1 Performed at [facility], [address] [place], [place] [address]   Fungus Culture Result     Status: None   Collection Time: 03/31/24 11:07 AM  Result Value Ref Range Status   Result 1 Comment  Final    Comment: (NOTE) KOH/Calcofluor preparation:  no fungus observed. Performed At: [facility] [address] [place], [place] [id number] Jennette Shorter MD Ey:[phone]     RADIOLOGY STUDIES/RESULTS: No results found.    LOS: 4 days   Donalda Applebaum, MD  [facility]    To contact the attending provider between 7A-7P or the covering provider during after hours 7P-7A, please log into the web site [email or web address]  and access using universal [place] password for that web site. If you do not have the password, please call the hospital operator.  04/04/2024, 2:07 PM    "

## 2024-04-04 NOTE — Progress Notes (Signed)
 Patient had difficulty following commands swallowing small bites of night meds crushed in applesauce. Spit out some and swallowed most.   Patient unable to safely swallow morning meds due to being lethargic and unable to follow commands.

## 2024-04-04 NOTE — NC FL2 (Signed)
 " [place]  MEDICAID FL2 LEVEL OF CARE FORM     IDENTIFICATION  Patient Name: Alicia Brennan Birthdate: 08/31/1950 Sex: female Admission Date (Current Location): 03/31/2024  [facility] [place] Number:  Chiropodist and Address:  [facility], [address] [place], [place] [address]      Provider Number: [id number]  Attending Physician Name and Address:  Raenelle Donalda HERO, MD  Relative Name and Phone Number:       Current Level of Care: Hospital Recommended Level of Care: Skilled Nursing Facility Prior Approval Number:    Date Approved/Denied:   PASRR Number: [id number]  Discharge Plan: SNF    Current Diagnoses: Patient Active Problem List   Diagnosis Date Noted   Breakthrough seizure (HCC) 03/31/2024   Sepsis due to gram-negative UTI (HCC) 03/24/2024   Hypoglycemia 03/24/2024   Seizure disorder (HCC) 03/24/2024   Essential hypertension 03/24/2024   GERD without esophagitis 03/24/2024   Depression 03/24/2024   Acute metabolic encephalopathy 03/24/2024   Hypokalemia 03/24/2024   Debility 02/05/2024   Obesity, Class I, BMI 30-34.9 02/05/2024   UTI (urinary tract infection) due to urinary indwelling Foley catheter 01/31/2024   Vascular dementia (HCC) 01/31/2024   Osteoarthritis of both hips 11/15/2015   Avascular necrosis of bones of both hips (HCC) 11/15/2015   Solitary pulmonary nodule 05/03/2015   Localization-related (focal) (partial) idiopathic epilepsy and epileptic syndromes with seizures of localized onset, not intractable, without status epilepticus (HCC) 04/25/2015   Cancer (HCC)    Sarcoidosis of lung    Hypertension    Recurrent headache     Orientation RESPIRATION BLADDER Height & Weight     Self  Normal Incontinent, External catheter Weight: 152 lb 1.9 oz (69 kg) Height:  5' 4 (162.6 cm)  BEHAVIORAL SYMPTOMS/MOOD NEUROLOGICAL BOWEL NUTRITION STATUS      Incontinent Diet (See dc summary)  AMBULATORY STATUS  COMMUNICATION OF NEEDS Skin   Extensive Assist Verbally PU Stage and Appropriate Care (Stage II on buttocks)                       Personal Care Assistance Level of Assistance  Bathing, Feeding, Dressing Bathing Assistance: Maximum assistance Feeding assistance: Maximum assistance Dressing Assistance: Maximum assistance     Functional Limitations Info  Hearing   Hearing Info: Impaired      SPECIAL CARE FACTORS FREQUENCY                  Contractures Contractures Info: Not present    Additional Factors Info  Code Status, Allergies, Isolation Precautions Code Status Info: DNR Allergies Info: NKA     Isolation Precautions Info: MRSA     Current Medications (04/04/2024):  This is the current hospital active medication list Current Facility-Administered Medications  Medication Dose Route Frequency Provider Last Rate Last Admin   0.45 % NaCl with KCl 20 mEq / L infusion   Intravenous Continuous Ghimire, Donalda HERO, MD       acetaminophen  (TYLENOL ) tablet 650 mg  650 mg Oral Q6H PRN Adefeso, Oladapo, DO       Or   acetaminophen  (TYLENOL ) suppository 650 mg  650 mg Rectal Q6H PRN Adefeso, Oladapo, DO       amLODipine  (NORVASC ) tablet 5 mg  5 mg Oral Daily Ghimire, Donalda HERO, MD   5 mg at 04/04/24 1006   divalproex  (DEPAKOTE ) DR tablet 250 mg  250 mg Oral Q8H Ghimire, Donalda HERO, MD  250 mg at 04/03/24 2116   enoxaparin  (LOVENOX ) injection 40 mg  40 mg Subcutaneous Q24H Adefeso, Oladapo, DO   40 mg at 04/04/24 1006   feeding supplement (ENSURE PLUS HIGH PROTEIN) liquid 237 mL  237 mL Oral BID BM Raenelle Donalda HERO, MD   237 mL at 04/04/24 1007   hydrALAZINE  (APRESOLINE ) injection 10 mg  10 mg Intravenous Q6H PRN Raenelle Donalda HERO, MD   10 mg at 04/03/24 1050   labetalol  (NORMODYNE ) injection 10 mg  10 mg Intravenous Q2H PRN Raenelle Donalda HERO, MD       lacosamide  (VIMPAT ) tablet 50 mg  50 mg Oral BID Raenelle Donalda HERO, MD   50 mg at 04/04/24 1006   levETIRAcetam  (KEPPRA )  tablet 500 mg  500 mg Oral BID Raenelle Donalda HERO, MD   500 mg at 04/04/24 1006   ondansetron  (ZOFRAN ) tablet 4 mg  4 mg Oral Q6H PRN Adefeso, Oladapo, DO       Or   ondansetron  (ZOFRAN ) injection 4 mg  4 mg Intravenous Q6H PRN Adefeso, Oladapo, DO       Oral care mouth rinse  15 mL Mouth Rinse 4 times per day Adefeso, Oladapo, DO   15 mL at 04/04/24 1211   Oral care mouth rinse  15 mL Mouth Rinse PRN Adefeso, Oladapo, DO       pantoprazole  (PROTONIX ) EC tablet 40 mg  40 mg Oral Daily Adefeso, Oladapo, DO   40 mg at 04/04/24 1006   sertraline  (ZOLOFT ) tablet 25 mg  25 mg Oral Daily Adefeso, Oladapo, DO   25 mg at 04/04/24 1006     Discharge Medications: Please see discharge summary for a list of discharge medications.  Relevant Imaging Results:  Relevant Lab Results:   Additional Information SSN   [id number]. LTC Medicaid bed with Hospice to follow at SNF.  [name], LCSW     "

## 2024-04-05 DIAGNOSIS — G40919 Epilepsy, unspecified, intractable, without status epilepticus: Secondary | ICD-10-CM | POA: Diagnosis not present

## 2024-04-05 LAB — BASIC METABOLIC PANEL WITH GFR
Anion gap: 7 (ref 5–15)
BUN: 7 mg/dL — ABNORMAL LOW (ref 8–23)
CO2: 25 mmol/L (ref 22–32)
Calcium: 8.8 mg/dL — ABNORMAL LOW (ref 8.9–10.3)
Chloride: 108 mmol/L (ref 98–111)
Creatinine, Ser: 0.52 mg/dL (ref 0.44–1.00)
GFR, Estimated: >60 mL/min
Glucose, Bld: 84 mg/dL (ref 70–99)
Potassium: 3.6 mmol/L (ref 3.5–5.1)
Sodium: 140 mmol/L (ref 135–145)

## 2024-04-05 NOTE — Progress Notes (Signed)
 "                        PROGRESS NOTE        PATIENT DETAILS Name: Alicia Brennan Age: 74 y.o. Sex: female Date of Birth: 10/01/1950 Admit Date: 03/31/2024 Admitting Physician Posey Maier, DO ERE:Qzops Tor Edra GRADE, MD  Brief Summary: Patient is a 74 y.o.  female with history of dementia, seizure disorder-who was transferred from [facility] for LTM EEG.  Significant events: 12/28-01/5>> hospitalization at [facility] for altered mental status-fever at [facility] workup negative-transferred to [facility] for LTM EEG. 01/5>> admit to TRH at [facility]  Significant studies: 12/28>> CT head: No acute intracranial abnormality 12/29>> CXR: No PNA 12/31>> RUE Doppler: No DVT 01/01>> MRI brain: No acute intracranial abnormality 01/03>> CXR: Left lingula subsegmental atelectasis.  Significant microbiology data: 12/28>> COVID/influenza/RSV PCR: Negative 12/29>> blood culture: No growth 12/29>> urine culture: Multiple species 12/29>> respiratory virus panel: Negative 01/03>> blood culture: No growth 01/03>> COVID/influenza/RSV PCR: Negative 01/05>> respiratory virus panel: Negative 01/05>> CSF meningitis panel: Negative 01/05>> CSF culture: Pending  Procedures: 1/5>> fluoroscopy-guided LP  Consults: Neurology Palliative  Subjective: No major issues overnight-lying comfortably in bed.  Oral intake remains poor but erratic.  Awaiting SNF bed with hospice follow-up.  Objective: Vitals: Blood pressure (!) 149/78, pulse 76, temperature 98.5 F (36.9 C), temperature source Axillary, resp. rate 17, height 5' 4 (1.626 m), weight 69 kg, SpO2 95%.   Exam: Frail-but pleasantly confused-answers simple questions appropriately Chest: Clear to auscultation CVS: S1-S2 regular Abdomen: Soft nontender Extremities: No edema Generalized weakness but nonfocal exam.  Pertinent Labs/Radiology:    Latest Ref Rng & Units 04/03/2024    3:47 AM 04/02/2024    3:41 AM 04/01/2024    2:57 AM  CBC  WBC 4.0 -  10.5 K/uL 7.3  7.5  9.8   Hemoglobin 12.0 - 15.0 g/dL 88.5  89.5  88.0   Hematocrit 36.0 - 46.0 % 36.4  32.3  37.7   Platelets 150 - 400 K/uL 205  200  201     Lab Results  Component Value Date   NA 140 04/05/2024   K 3.6 04/05/2024   CL 108 04/05/2024   CO2 25 04/05/2024      Assessment/Plan: Fever Had episodes of fever at [facility] on 1/3-1/5-none since then. Has had extensive workup-see above-no source apparent Although UA suggestive of UTI-urine cultures nondiagnostic Suspicion for aspiration pneumonitis-in the setting of AMS-completed a course of Unasyn  on 1/8.   Acute metabolic encephalopathy Thought to be secondary to infectious disease in the setting of fever-but concerned that patient could be having nonconvulsive seizures-LTM EEG negative for seizures. Overall improved-has underlying dementia-maintain delirium precautions.  Continue Keppra /Vimpat /Depakote .  Seizure disorder See above  Mood disorder Zoloft   GERD PPI  HTN BP stable  Continue amlodipine . Avoid Micardis given potential for poor oral intake and dehydration and AKI.  Hypokalemia Repleted.  Hypernatremia Likely secondary to poor oral intake Encourage oral intake Sodium has now stabilized-repeat electrolytes periodically-no longer on half-normal NS.  Dementia Delirium precautions  Palliative care Long discussion with daughter on 1/7-understands poor overall prognosis-we discussed how tube feeds really do not improve quality of life and longevity.  Subsequently evaluated by palliative care on 1/8-DNR-no tube feeds-SNF with hospice/palliative follow-up planned.   Pressure Ulcer: Agree with assessment as outlined below. Wound 03/24/24 1721 Pressure Injury Buttocks Medial Stage 2 -  Partial thickness loss of dermis presenting as a shallow open injury with a  red, pink wound bed without slough. (Active)   Code status:   Code Status: Limited: Do not attempt resuscitation (DNR) -DNR-LIMITED -Do Not  Intubate/DNI    DVT Prophylaxis: enoxaparin  (LOVENOX ) injection 40 mg Start: 04/01/24 1000 SCDs Start: 04/01/24 0023   Family Communication:Daughter-[name] [phone] (cell)-[phone] (work)-updated 1/7   Disposition Plan: Status is: Inpatient Remains inpatient appropriate because: Severity of illness   Planned Discharge Destination:Skilled nursing facility   Diet: Diet Order             Diet regular Room service appropriate? Yes with Assist; Fluid consistency: Thin  Diet effective now                     Antimicrobial agents: Anti-infectives (From admission, onward)    Start     Dose/Rate Route Frequency Ordered Stop   04/03/24 1400  Ampicillin -Sulbactam (UNASYN ) 3 g in sodium chloride  0.9 % 100 mL IVPB        3 g 200 mL/hr over 30 Minutes Intravenous Every 6 hours 04/03/24 1340 04/03/24 2144   04/01/24 1100  Ampicillin -Sulbactam (UNASYN ) 3 g in sodium chloride  0.9 % 100 mL IVPB  Status:  Discontinued        3 g 200 mL/hr over 30 Minutes Intravenous Every 6 hours 04/01/24 1003 04/03/24 1340   04/01/24 1000  cefTRIAXone  (ROCEPHIN ) 2 g in sodium chloride  0.9 % 100 mL IVPB  Status:  Discontinued        2 g 200 mL/hr over 30 Minutes Intravenous Every 12 hours 04/01/24 0137 04/01/24 0152   04/01/24 1000  acyclovir  (ZOVIRAX ) 700 mg in dextrose  5 % 100 mL IVPB  Status:  Discontinued        700 mg 114 mL/hr over 60 Minutes Intravenous Every 12 hours 04/01/24 0238 04/01/24 1002   04/01/24 1000  cefTRIAXone  (ROCEPHIN ) 2 g in sodium chloride  0.9 % 100 mL IVPB  Status:  Discontinued        2 g 200 mL/hr over 30 Minutes Intravenous Every 12 hours 04/01/24 0238 04/01/24 1002   04/01/24 1000  vancomycin  (VANCOCIN ) IVPB 1000 mg/200 mL premix  Status:  Discontinued        1,000 mg 200 mL/hr over 60 Minutes Intravenous Every 24 hours 04/01/24 0238 04/01/24 0307   04/01/24 1000  cefTRIAXone  (ROCEPHIN ) 2 g in sodium chloride  0.9 % 100 mL IVPB  Status:  Discontinued        2  g 200 mL/hr over 30 Minutes Intravenous Every 24 hours 04/01/24 0152 04/01/24 0259   04/01/24 0600  ampicillin  (OMNIPEN) 2 g in sodium chloride  0.9 % 100 mL IVPB  Status:  Discontinued        2 g 300 mL/hr over 20 Minutes Intravenous Every 6 hours 04/01/24 0238 04/01/24 1002   04/01/24 0307  vancomycin  variable dose per unstable renal function (pharmacist dosing)  Status:  Discontinued         Does not apply See admin instructions 04/01/24 0307 04/01/24 1002        MEDICATIONS: Scheduled Meds:  amLODipine   5 mg Oral Daily   divalproex   250 mg Oral Q8H   enoxaparin  (LOVENOX ) injection  40 mg Subcutaneous Q24H   feeding supplement  237 mL Oral BID BM   lacosamide   50 mg Oral BID   levETIRAcetam   500 mg Oral BID   mouth rinse  15 mL Mouth Rinse 4 times per day   pantoprazole   40 mg Oral Daily  sertraline   25 mg Oral Daily   Continuous Infusions:  0.45 % NaCl with KCl 20 mEq / L 30 mL/hr at 04/05/24 0500   valproate sodium  Stopped (04/05/24 0230)   PRN Meds:.acetaminophen  **OR** acetaminophen , hydrALAZINE , labetalol , ondansetron  **OR** ondansetron  (ZOFRAN ) IV, mouth rinse   I have personally reviewed following labs and imaging studies  LABORATORY DATA: CBC: Recent Labs  Lab 04/01/24 0257 04/02/24 0341 04/03/24 0347  WBC 9.8 7.5 7.3  HGB 11.9* 10.4* 11.4*  HCT 37.7 32.3* 36.4  MCV 96.7 93.9 95.0  PLT 201 200 205    Basic Metabolic Panel: Recent Labs  Lab 04/01/24 0257 04/02/24 0341 04/03/24 0347 04/04/24 0256 04/05/24 0442  NA 146* 145 146* 144 140  K 3.4* 2.9* 3.5 3.6 3.6  CL 112* 111 112* 109 108  CO2 22 23 25 26 25   GLUCOSE 81 102* 101* 101* 84  BUN 23 15 9  7* 7*  CREATININE 0.90 0.65 0.54 0.58 0.52  CALCIUM 9.5 9.0 9.0 8.8* 8.8*  MG 2.2  --   --  1.8  --   PHOS 3.1  --   --  2.5  --     GFR: Estimated Creatinine Clearance: 57.9 mL/min (by C-G formula based on SCr of 0.52 mg/dL).  Liver Function Tests: Recent Labs  Lab 04/01/24 0257  AST 90*   ALT 41  ALKPHOS 80  BILITOT 0.4  PROT 7.0  ALBUMIN  2.7*   No results for input(s): LIPASE, AMYLASE in the last 168 hours. Recent Labs  Lab 04/01/24 0915  AMMONIA 26    Coagulation Profile: No results for input(s): INR, PROTIME in the last 168 hours.  Cardiac Enzymes: Recent Labs  Lab 03/30/24 2040  CKTOTAL 100    BNP (last 3 results) Recent Labs    12/06/23 1658  PROBNP 64.3    Lipid Profile: No results for input(s): CHOL, HDL, LDLCALC, TRIG, CHOLHDL, LDLDIRECT in the last 72 hours.  Thyroid Function Tests: No results for input(s): TSH, T4TOTAL, FREET4, T3FREE, THYROIDAB in the last 72 hours.   Anemia Panel: No results for input(s): VITAMINB12, FOLATE, FERRITIN, TIBC, IRON, RETICCTPCT in the last 72 hours.   Urine analysis:    Component Value Date/Time   COLORURINE YELLOW 03/31/2024 2112   APPEARANCEUR CLOUDY (A) 03/31/2024 2112   APPEARANCEUR Clear 05/17/2013 1525   LABSPEC 1.017 03/31/2024 2112   LABSPEC 1.019 05/17/2013 1525   PHURINE 6.0 03/31/2024 2112   GLUCOSEU NEGATIVE 03/31/2024 2112   GLUCOSEU Negative 05/17/2013 1525   HGBUR SMALL (A) 03/31/2024 2112   BILIRUBINUR NEGATIVE 03/31/2024 2112   BILIRUBINUR Negative 05/17/2013 1525   KETONESUR NEGATIVE 03/31/2024 2112   PROTEINUR 30 (A) 03/31/2024 2112   NITRITE NEGATIVE 03/31/2024 2112   LEUKOCYTESUR LARGE (A) 03/31/2024 2112   LEUKOCYTESUR Trace 05/17/2013 1525    Sepsis Labs: Lactic Acid, Venous    Component Value Date/Time   LATICACIDVEN 1.0 03/24/2024 0147    MICROBIOLOGY: Recent Results (from the past 240 hours)  Resp panel by RT-PCR (RSV, Flu A&B, Covid) Anterior Nasal Swab     Status: None   Collection Time: 03/29/24  4:45 PM   Specimen: Anterior Nasal Swab  Result Value Ref Range Status   SARS Coronavirus 2 by RT PCR NEGATIVE NEGATIVE Final    Comment: (NOTE) SARS-CoV-2 target nucleic acids are NOT DETECTED.  The SARS-CoV-2 RNA is  generally detectable in upper respiratory specimens during the acute phase of infection. The lowest concentration of SARS-CoV-2 viral copies this assay can  detect is 138 copies/mL. A negative result does not preclude SARS-Cov-2 infection and should not be used as the sole basis for treatment or other patient management decisions. A negative result may occur with  improper specimen collection/handling, submission of specimen other than nasopharyngeal swab, presence of viral mutation(s) within the areas targeted by this assay, and inadequate number of viral copies(<138 copies/mL). A negative result must be combined with clinical observations, patient history, and epidemiological information. The expected result is Negative.  Fact Sheet for Patients:  bloggercourse.com  Fact Sheet for Healthcare Providers:  seriousbroker.it  This test is no t yet approved or cleared by the [place]  FDA and  has been authorized for detection and/or diagnosis of SARS-CoV-2 by FDA under an Emergency Use Authorization (EUA). This EUA will remain  in effect (meaning this test can be used) for the duration of the COVID-19 declaration under Section 564(b)(1) of the Act, 21 U.S.C.section 360bbb-3(b)(1), unless the authorization is terminated  or revoked sooner.       Influenza A by PCR NEGATIVE NEGATIVE Final   Influenza B by PCR NEGATIVE NEGATIVE Final    Comment: (NOTE) The Xpert Xpress SARS-CoV-2/FLU/RSV plus assay is intended as an aid in the diagnosis of influenza from Nasopharyngeal swab specimens and should not be used as a sole basis for treatment. Nasal washings and aspirates are unacceptable for Xpert Xpress SARS-CoV-2/FLU/RSV testing.  Fact Sheet for Patients: bloggercourse.com  Fact Sheet for Healthcare Providers: seriousbroker.it  This test is not yet approved or cleared by the [place]  [place] FDA and has been authorized for detection and/or diagnosis of SARS-CoV-2 by FDA under an Emergency Use Authorization (EUA). This EUA will remain in effect (meaning this test can be used) for the duration of the COVID-19 declaration under Section 564(b)(1) of the Act, 21 U.S.C. section 360bbb-3(b)(1), unless the authorization is terminated or revoked.     Resp Syncytial Virus by PCR NEGATIVE NEGATIVE Final    Comment: (NOTE) Fact Sheet for Patients: bloggercourse.com  Fact Sheet for Healthcare Providers: seriousbroker.it  This test is not yet approved or cleared by the [place]  FDA and has been authorized for detection and/or diagnosis of SARS-CoV-2 by FDA under an Emergency Use Authorization (EUA). This EUA will remain in effect (meaning this test can be used) for the duration of the COVID-19 declaration under Section 564(b)(1) of the Act, 21 U.S.C. section 360bbb-3(b)(1), unless the authorization is terminated or revoked.  Performed at [facility], [address] [place], [place] [address]   Culture, blood (Routine X 2) w Reflex to ID Panel     Status: None (Preliminary result)   Collection Time: 03/29/24  6:23 PM   Specimen: BLOOD  Result Value Ref Range Status   Specimen Description BLOOD BLOOD RIGHT HAND  Final   Special Requests   Final    BOTTLES DRAWN AEROBIC AND ANAEROBIC Blood Culture results may not be optimal due to an inadequate volume of blood received in culture bottles   Culture   Final    NO GROWTH 4 DAYS Performed at [facility], [address] [place], [place] [address]    Report Status PENDING  Incomplete  Culture, blood (Routine X 2) w Reflex to ID Panel     Status: None (Preliminary result)   Collection Time: 03/29/24 11:16 PM   Specimen: BLOOD RIGHT HAND  Result Value Ref Range Status   Specimen Description BLOOD RIGHT HAND  Final   Special Requests   Final  BOTTLES DRAWN AEROBIC ONLY Blood Culture adequate volume   Culture   Final    NO GROWTH 4 DAYS Performed at [facility], [address] [place], [place] [address]    Report Status PENDING  Incomplete  Respiratory (~20 pathogens) panel by PCR     Status: None   Collection Time: 03/31/24  9:00 AM   Specimen: Nasopharyngeal Swab; Respiratory  Result Value Ref Range Status   Adenovirus NOT DETECTED NOT DETECTED Final   Coronavirus 229E NOT DETECTED NOT DETECTED Final    Comment: (NOTE) The Coronavirus on the Respiratory Panel, DOES NOT test for the novel  Coronavirus (2019 nCoV)    Coronavirus HKU1 NOT DETECTED NOT DETECTED Final   Coronavirus NL63 NOT DETECTED NOT DETECTED Final   Coronavirus OC43 NOT DETECTED NOT DETECTED Final   Metapneumovirus NOT DETECTED NOT DETECTED Final   Rhinovirus / Enterovirus NOT DETECTED NOT DETECTED Final   Influenza A NOT DETECTED NOT DETECTED Final   Influenza B NOT DETECTED NOT DETECTED Final   Parainfluenza Virus 1 NOT DETECTED NOT DETECTED Final   Parainfluenza Virus 2 NOT DETECTED NOT DETECTED Final   Parainfluenza Virus 3 NOT DETECTED NOT DETECTED Final   Parainfluenza Virus 4 NOT DETECTED NOT DETECTED Final   Respiratory Syncytial Virus NOT DETECTED NOT DETECTED Final   Bordetella pertussis NOT DETECTED NOT DETECTED Final   Bordetella Parapertussis NOT DETECTED NOT DETECTED Final   Chlamydophila pneumoniae NOT DETECTED NOT DETECTED Final   Mycoplasma pneumoniae NOT DETECTED NOT DETECTED Final    Comment: Performed at [facility], [address], [place], [place] [address]  CSF culture w Gram Stain     Status: None   Collection Time: 03/31/24 11:07 AM   Specimen: PATH Cytology CSF; Cerebrospinal Fluid  Result Value Ref Range Status   Specimen Description   Final    CSF Performed at [facility], [address] [place], [place] [address]    Special Requests   Final    C2 Performed at [facility], [address] [place], [place] [address]    Gram Stain   Final    WBC SEEN RED BLOOD CELLS NO ORGANISMS SEEN Performed at [facility], [address], [place], [place] [address]    Culture   Final    NO GROWTH 3 DAYS Performed at [facility], [address] [place], [place] [address]    Report Status 04/03/2024 FINAL  Final  Fungus Culture With Stain     Status: None (Preliminary result)   Collection Time: 03/31/24 11:07 AM   Specimen: PATH Cytology CSF; Cerebrospinal Fluid  Result Value Ref Range Status   Fungus Stain Final report  Final    Comment: (NOTE) Performed At: [facility] [address] [place], [place] [id number] Jennette Shorter MD Ey:[id number]    Fungus (Mycology) Culture PENDING  Incomplete   Fungal Source CSF  Final    Comment: TUBE 1 Performed at [facility], [address] [place], [place] [address]   Fungus Culture Result     Status: None   Collection Time: 03/31/24 11:07 AM  Result Value Ref Range Status   Result 1 Comment  Final    Comment: (NOTE) KOH/Calcofluor preparation:  no fungus observed. Performed At: [facility] [address] [place], [place] [id number] Jennette Shorter MD Ey:[id number]     RADIOLOGY STUDIES/RESULTS: No results found.    LOS: 5 days   Donalda Applebaum, MD  [facility]    To contact the attending provider between 7A-7P or the covering provider during after hours 7P-7A, please log into the web site [email or web address] and access using universal [place] password for that web site. If you do not have the password, please call the hospital operator.  04/05/2024, 11:05 AM    "

## 2024-04-05 NOTE — Plan of Care (Signed)

## 2024-04-06 LAB — BASIC METABOLIC PANEL WITH GFR
Anion gap: 9 (ref 5–15)
BUN: 9 mg/dL (ref 8–23)
CO2: 24 mmol/L (ref 22–32)
Calcium: 8.6 mg/dL — ABNORMAL LOW (ref 8.9–10.3)
Chloride: 104 mmol/L (ref 98–111)
Creatinine, Ser: 0.54 mg/dL (ref 0.44–1.00)
GFR, Estimated: >60 mL/min
Glucose, Bld: 99 mg/dL (ref 70–99)
Potassium: 3.8 mmol/L (ref 3.5–5.1)
Sodium: 137 mmol/L (ref 135–145)

## 2024-04-06 NOTE — Progress Notes (Signed)
 "                        PROGRESS NOTE        PATIENT DETAILS Name: Alicia Brennan Age: 74 y.o. Sex: female Date of Birth: November 21, 1950 Admit Date: 03/31/2024 Admitting Physician Posey Maier, DO ERE:Qzops Tor Edra GRADE, MD  Brief Summary: Patient is a 74 y.o.  female with history of dementia, seizure disorder-who was transferred from [facility] for LTM EEG.  Significant events: 12/28-01/5>> hospitalization at [facility] for altered mental status-fever at [facility] workup negative-transferred to [facility] for LTM EEG. 01/5>> admit to TRH at [facility].  Significant studies: 12/28>> CT head: No acute intracranial abnormality 12/29>> CXR: No PNA 12/31>> RUE Doppler: No DVT 01/01>> MRI brain: No acute intracranial abnormality 01/03>> CXR: Left lingula subsegmental atelectasis.  Significant microbiology data: 12/28>> COVID/influenza/RSV PCR: Negative 12/29>> blood culture: No growth 12/29>> urine culture: Multiple species 12/29>> respiratory virus panel: Negative 01/03>> blood culture: No growth 01/03>> COVID/influenza/RSV PCR: Negative 01/05>> respiratory virus panel: Negative 01/05>> CSF meningitis panel: Negative 01/05>> CSF culture: Pending  Procedures: 1/5>> fluoroscopy-guided LP  Consults: Neurology Palliative  Subjective: Patient remains in bed, appears overall comfortable, extremely weak and frail at baseline, denies any headache chest or abdominal pain.  Denies any shortness of breath.  Objective: Vitals: Blood pressure (!) 155/81, pulse 79, temperature 98.5 F (36.9 C), temperature source Oral, resp. rate 18, height 5' 4 (1.626 m), weight 69 kg, SpO2 94%.   Exam:  Patient in bed, overall extremely frail, moves all 4 extremities by self CTAB RRR Abdomen soft nontender No edema  Assessment/Plan:  Fever Had episodes of fever at [facility] on 1/3-1/5-none since then. Has had extensive workup-see above-no source apparent Although UA suggestive of UTI-urine cultures  nondiagnostic Suspicion for aspiration pneumonitis-in the setting of AMS-completed a course of Unasyn  on 1/8.   Acute metabolic encephalopathy Thought to be secondary to infectious disease in the setting of fever-but concerned that patient could be having nonconvulsive seizures-LTM EEG negative for seizures. Overall improved-has underlying dementia-maintain delirium precautions.  Continue Keppra /Vimpat /Depakote .  Seizure disorder See above  Mood disorder Zoloft   GERD PPI  HTN BP stable  Continue amlodipine . Avoid Micardis given potential for poor oral intake and dehydration and AKI.  Hypokalemia Repleted.  Hypernatremia Likely secondary to poor oral intake Encourage oral intake Sodium has now stabilized-repeat electrolytes periodically-no longer on half-normal NS.  Dementia Delirium precautions  Palliative care Long discussion with daughter on 1/7-understands poor overall prognosis-we discussed how tube feeds really do not improve quality of life and longevity.  Subsequently evaluated by palliative care on 1/8-DNR-no tube feeds-SNF with hospice/palliative follow-up planned.   Pressure Ulcer: Agree with assessment as outlined below. Wound 03/24/24 1721 Pressure Injury Buttocks Medial Stage 2 -  Partial thickness loss of dermis presenting as a shallow open injury with a red, pink wound bed without slough. (Active)   Code status:   Code Status: Limited: Do not attempt resuscitation (DNR) -DNR-LIMITED -Do Not Intubate/DNI    DVT Prophylaxis: enoxaparin  (LOVENOX ) injection 40 mg Start: 04/01/24 1000 SCDs Start: 04/01/24 0023   Family Communication:Daughter-[name] [phone] ([phone] (work)-updated 1/7   Disposition Plan: Status is: Inpatient Remains inpatient appropriate because: Severity of illness   Planned Discharge Destination:Skilled nursing facility   Diet: Diet Order             Diet regular Room service appropriate? Yes with Assist;  Fluid consistency: Thin  Diet effective now  Antimicrobial agents: Anti-infectives (From admission, onward)    Start     Dose/Rate Route Frequency Ordered Stop   04/03/24 1400  Ampicillin -Sulbactam (UNASYN ) 3 g in sodium chloride  0.9 % 100 mL IVPB        3 g 200 mL/hr over 30 Minutes Intravenous Every 6 hours 04/03/24 1340 04/03/24 2144   04/01/24 1100  Ampicillin -Sulbactam (UNASYN ) 3 g in sodium chloride  0.9 % 100 mL IVPB  Status:  Discontinued        3 g 200 mL/hr over 30 Minutes Intravenous Every 6 hours 04/01/24 1003 04/03/24 1340   04/01/24 1000  cefTRIAXone  (ROCEPHIN ) 2 g in sodium chloride  0.9 % 100 mL IVPB  Status:  Discontinued        2 g 200 mL/hr over 30 Minutes Intravenous Every 12 hours 04/01/24 0137 04/01/24 0152   04/01/24 1000  acyclovir  (ZOVIRAX ) 700 mg in dextrose  5 % 100 mL IVPB  Status:  Discontinued        700 mg 114 mL/hr over 60 Minutes Intravenous Every 12 hours 04/01/24 0238 04/01/24 1002   04/01/24 1000  cefTRIAXone  (ROCEPHIN ) 2 g in sodium chloride  0.9 % 100 mL IVPB  Status:  Discontinued        2 g 200 mL/hr over 30 Minutes Intravenous Every 12 hours 04/01/24 0238 04/01/24 1002   04/01/24 1000  vancomycin  (VANCOCIN ) IVPB 1000 mg/200 mL premix  Status:  Discontinued        1,000 mg 200 mL/hr over 60 Minutes Intravenous Every 24 hours 04/01/24 0238 04/01/24 0307   04/01/24 1000  cefTRIAXone  (ROCEPHIN ) 2 g in sodium chloride  0.9 % 100 mL IVPB  Status:  Discontinued        2 g 200 mL/hr over 30 Minutes Intravenous Every 24 hours 04/01/24 0152 04/01/24 0259   04/01/24 0600  ampicillin  (OMNIPEN) 2 g in sodium chloride  0.9 % 100 mL IVPB  Status:  Discontinued        2 g 300 mL/hr over 20 Minutes Intravenous Every 6 hours 04/01/24 0238 04/01/24 1002   04/01/24 0307  vancomycin  variable dose per unstable renal function (pharmacist dosing)  Status:  Discontinued         Does not apply See admin instructions 04/01/24 0307 04/01/24 1002         MEDICATIONS: Scheduled Meds:  amLODipine   5 mg Oral Daily   divalproex   250 mg Oral Q8H   enoxaparin  (LOVENOX ) injection  40 mg Subcutaneous Q24H   feeding supplement  237 mL Oral BID BM   lacosamide   50 mg Oral BID   levETIRAcetam   500 mg Oral BID   mouth rinse  15 mL Mouth Rinse 4 times per day   pantoprazole   40 mg Oral Daily   sertraline   25 mg Oral Daily   Continuous Infusions:   PRN Meds:.acetaminophen  **OR** acetaminophen , hydrALAZINE , labetalol , ondansetron  **OR** ondansetron  (ZOFRAN ) IV, mouth rinse   I have personally reviewed following labs and imaging studies  LABORATORY DATA: CBC: Recent Labs  Lab 04/01/24 0257 04/02/24 0341 04/03/24 0347  WBC 9.8 7.5 7.3  HGB 11.9* 10.4* 11.4*  HCT 37.7 32.3* 36.4  MCV 96.7 93.9 95.0  PLT 201 200 205    Basic Metabolic Panel: Recent Labs  Lab 04/01/24 0257 04/02/24 0341 04/03/24 0347 04/04/24 0256 04/05/24 0442 04/06/24 0414  NA 146* 145 146* 144 140 137  K 3.4* 2.9* 3.5 3.6 3.6 3.8  CL 112* 111 112* 109 108 104  CO2 22 23 25  26 25 24   GLUCOSE 81 102* 101* 101* 84 99  BUN 23 15 9  7* 7* 9  CREATININE 0.90 0.65 0.54 0.58 0.52 0.54  CALCIUM 9.5 9.0 9.0 8.8* 8.8* 8.6*  MG 2.2  --   --  1.8  --   --   PHOS 3.1  --   --  2.5  --   --     GFR: Estimated Creatinine Clearance: 57.9 mL/min (by C-G formula based on SCr of 0.54 mg/dL).  Liver Function Tests: Recent Labs  Lab 04/01/24 0257  AST 90*  ALT 41  ALKPHOS 80  BILITOT 0.4  PROT 7.0  ALBUMIN  2.7*   No results for input(s): LIPASE, AMYLASE in the last 168 hours. Recent Labs  Lab 04/01/24 0915  AMMONIA 26    Coagulation Profile: No results for input(s): INR, PROTIME in the last 168 hours.  Cardiac Enzymes: Recent Labs  Lab 03/30/24 2040  CKTOTAL 100    BNP (last 3 results) Recent Labs    12/06/23 1658  PROBNP 64.3    Lipid Profile: No results for input(s): CHOL, HDL, LDLCALC, TRIG, CHOLHDL, LDLDIRECT in  the last 72 hours.  Thyroid Function Tests: No results for input(s): TSH, T4TOTAL, FREET4, T3FREE, THYROIDAB in the last 72 hours.   Anemia Panel: No results for input(s): VITAMINB12, FOLATE, FERRITIN, TIBC, IRON, RETICCTPCT in the last 72 hours.   Urine analysis:    Component Value Date/Time   COLORURINE YELLOW 03/31/2024 2112   APPEARANCEUR CLOUDY (A) 03/31/2024 2112   APPEARANCEUR Clear 05/17/2013 1525   LABSPEC 1.017 03/31/2024 2112   LABSPEC 1.019 05/17/2013 1525   PHURINE 6.0 03/31/2024 2112   GLUCOSEU NEGATIVE 03/31/2024 2112   GLUCOSEU Negative 05/17/2013 1525   HGBUR SMALL (A) 03/31/2024 2112   BILIRUBINUR NEGATIVE 03/31/2024 2112   BILIRUBINUR Negative 05/17/2013 1525   KETONESUR NEGATIVE 03/31/2024 2112   PROTEINUR 30 (A) 03/31/2024 2112   NITRITE NEGATIVE 03/31/2024 2112   LEUKOCYTESUR LARGE (A) 03/31/2024 2112   LEUKOCYTESUR Trace 05/17/2013 1525    Sepsis Labs: Lactic Acid, Venous    Component Value Date/Time   LATICACIDVEN 1.0 03/24/2024 0147    MICROBIOLOGY: Recent Results (from the past 240 hours)  Resp panel by RT-PCR (RSV, Flu A&B, Covid) Anterior Nasal Swab     Status: None   Collection Time: 03/29/24  4:45 PM   Specimen: Anterior Nasal Swab  Result Value Ref Range Status   SARS Coronavirus 2 by RT PCR NEGATIVE NEGATIVE Final    Comment: (NOTE) SARS-CoV-2 target nucleic acids are NOT DETECTED.  The SARS-CoV-2 RNA is generally detectable in upper respiratory specimens during the acute phase of infection. The lowest concentration of SARS-CoV-2 viral copies this assay can detect is 138 copies/mL. A negative result does not preclude SARS-Cov-2 infection and should not be used as the sole basis for treatment or other patient management decisions. A negative result may occur with  improper specimen collection/handling, submission of specimen other than nasopharyngeal swab, presence of viral mutation(s) within the areas  targeted by this assay, and inadequate number of viral copies(<138 copies/mL). A negative result must be combined with clinical observations, patient history, and epidemiological information. The expected result is Negative.  Fact Sheet for Patients:  bloggercourse.com  Fact Sheet for Healthcare Providers:  seriousbroker.it  This test is no t yet approved or cleared by the [place]  FDA and  has been authorized for detection and/or diagnosis of SARS-CoV-2 by FDA under an Emergency Use Authorization (EUA). This  EUA will remain  in effect (meaning this test can be used) for the duration of the COVID-19 declaration under Section 564(b)(1) of the Act, 21 U.S.C.section 360bbb-3(b)(1), unless the authorization is terminated  or revoked sooner.       Influenza A by PCR NEGATIVE NEGATIVE Final   Influenza B by PCR NEGATIVE NEGATIVE Final    Comment: (NOTE) The Xpert Xpress SARS-CoV-2/FLU/RSV plus assay is intended as an aid in the diagnosis of influenza from Nasopharyngeal swab specimens and should not be used as a sole basis for treatment. Nasal washings and aspirates are unacceptable for Xpert Xpress SARS-CoV-2/FLU/RSV testing.  Fact Sheet for Patients: bloggercourse.com  Fact Sheet for Healthcare Providers: seriousbroker.it  This test is not yet approved or cleared by the [place]  FDA and has been authorized for detection and/or diagnosis of SARS-CoV-2 by FDA under an Emergency Use Authorization (EUA). This EUA will remain in effect (meaning this test can be used) for the duration of the COVID-19 declaration under Section 564(b)(1) of the Act, 21 U.S.C. section 360bbb-3(b)(1), unless the authorization is terminated or revoked.     Resp Syncytial Virus by PCR NEGATIVE NEGATIVE Final    Comment: (NOTE) Fact Sheet for  Patients: bloggercourse.com  Fact Sheet for Healthcare Providers: seriousbroker.it  This test is not yet approved or cleared by the [place]  FDA and has been authorized for detection and/or diagnosis of SARS-CoV-2 by FDA under an Emergency Use Authorization (EUA). This EUA will remain in effect (meaning this test can be used) for the duration of the COVID-19 declaration under Section 564(b)(1) of the Act, 21 U.S.C. section 360bbb-3(b)(1), unless the authorization is terminated or revoked.  Performed at [facility], [address] [place], [place] [address]   Culture, blood (Routine X 2) w Reflex to ID Panel     Status: None (Preliminary result)   Collection Time: 03/29/24  6:23 PM   Specimen: BLOOD  Result Value Ref Range Status   Specimen Description BLOOD BLOOD RIGHT HAND  Final   Special Requests   Final    BOTTLES DRAWN AEROBIC AND ANAEROBIC Blood Culture results may not be optimal due to an inadequate volume of blood received in culture bottles   Culture   Final    NO GROWTH 4 DAYS Performed at [facility], [address], [place], [place] [address]    Report Status PENDING  Incomplete  Culture, blood (Routine X 2) w Reflex to ID Panel     Status: None (Preliminary result)   Collection Time: 03/29/24 11:16 PM   Specimen: BLOOD RIGHT HAND  Result Value Ref Range Status   Specimen Description BLOOD RIGHT HAND  Final   Special Requests   Final    BOTTLES DRAWN AEROBIC ONLY Blood Culture adequate volume   Culture   Final    NO GROWTH 4 DAYS Performed at [facility], [address], [place], [place] [address]    Report Status PENDING  Incomplete  Respiratory (~20 pathogens) panel by PCR     Status: None   Collection Time: 03/31/24  9:00 AM   Specimen: Nasopharyngeal Swab; Respiratory  Result Value Ref Range Status   Adenovirus NOT DETECTED NOT DETECTED Final   Coronavirus 229E NOT  DETECTED NOT DETECTED Final    Comment: (NOTE) The Coronavirus on the Respiratory Panel, DOES NOT test for the novel  Coronavirus (2019 nCoV)    Coronavirus HKU1 NOT DETECTED NOT DETECTED Final   Coronavirus NL63 NOT DETECTED NOT DETECTED  Final   Coronavirus OC43 NOT DETECTED NOT DETECTED Final   Metapneumovirus NOT DETECTED NOT DETECTED Final   Rhinovirus / Enterovirus NOT DETECTED NOT DETECTED Final   Influenza A NOT DETECTED NOT DETECTED Final   Influenza B NOT DETECTED NOT DETECTED Final   Parainfluenza Virus 1 NOT DETECTED NOT DETECTED Final   Parainfluenza Virus 2 NOT DETECTED NOT DETECTED Final   Parainfluenza Virus 3 NOT DETECTED NOT DETECTED Final   Parainfluenza Virus 4 NOT DETECTED NOT DETECTED Final   Respiratory Syncytial Virus NOT DETECTED NOT DETECTED Final   Bordetella pertussis NOT DETECTED NOT DETECTED Final   Bordetella Parapertussis NOT DETECTED NOT DETECTED Final   Chlamydophila pneumoniae NOT DETECTED NOT DETECTED Final   Mycoplasma pneumoniae NOT DETECTED NOT DETECTED Final    Comment: Performed at [facility], [address] [place], [place] [address]  CSF culture w Gram Stain     Status: None   Collection Time: 03/31/24 11:07 AM   Specimen: PATH Cytology CSF; Cerebrospinal Fluid  Result Value Ref Range Status   Specimen Description   Final    CSF Performed at [facility], [address], [place], [place] [address]    Special Requests   Final    C2 Performed at [facility], [address] [place], [place] [address]    Gram Stain   Final    WBC SEEN RED BLOOD CELLS NO ORGANISMS SEEN Performed at [facility], [address], [place], [place] [address]    Culture   Final    NO GROWTH 3 DAYS Performed at [facility], [address] [place], [place] [address]    Report Status 04/03/2024 FINAL  Final  Fungus Culture With Stain     Status: None (Preliminary result)   Collection Time: 03/31/24 11:07 AM    Specimen: PATH Cytology CSF; Cerebrospinal Fluid  Result Value Ref Range Status   Fungus Stain Final report  Final    Comment: (NOTE) Performed At: [facility] [address] [place], [place] [id number] Jennette Shorter MD Ey:[id number]    Fungus (Mycology) Culture PENDING  Incomplete   Fungal Source CSF  Final    Comment: TUBE 1 Performed at [facility], [address] [place], [place] [address]   Fungus Culture Result     Status: None   Collection Time: 03/31/24 11:07 AM  Result Value Ref Range Status   Result 1 Comment  Final    Comment: (NOTE) KOH/Calcofluor preparation:  no fungus observed. Performed At: [facility] [address] [place], [place] [id number] Jennette Shorter MD Ey:[id number]     RADIOLOGY STUDIES/RESULTS: No results found.    LOS: 6 days   Lavada Stank, MD  [facility]    To contact the attending provider between 7A-7P or the covering provider during after hours 7P-7A, please log into the web site [email or web address] and access using universal [place] password for that web site. If you do not have the password, please call the hospital operator.  04/06/2024, 7:59 AM    "

## 2024-04-07 LAB — CULTURE, BLOOD (ROUTINE X 2)
Culture: NO GROWTH
Culture: NO GROWTH
Special Requests: ADEQUATE

## 2024-04-07 MED ORDER — CARVEDILOL 3.125 MG PO TABS
3.1250 mg | ORAL_TABLET | Freq: Two times a day (BID) | ORAL | Status: DC
  Administered 2024-04-07 – 2024-04-21 (×25): 3.125 mg via ORAL
  Filled 2024-04-07 (×28): qty 1

## 2024-04-07 NOTE — Progress Notes (Signed)
 Physical Therapy Treatment Patient Details Name: Alicia Brennan MRN: [id number] DOB: [birth year] Today's Date: 04/07/2024   History of Present Illness 74 y.o. female admitted from SNF to [facility] on 03/24/24 with AMS, fever, concerns for aspiration. Transfer to [facility] for LTM EEG, negative for seizures. Head CT/MRI negative for acute injury. RUE swelling; imaging negative for DVT. CXR 1/3 with atelectasis. Concern for aspiration pneumonitis. Fluoroscopy-guided LP 1/5. 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   PT Comments  Pt with limited mobility progression. Pt remains limited by BLE pain, diffuse weakness, poor activity tolerance and impaired cognition. Pt tolerating partial range BLE PROM progressing to rolling with max-totalA. Pt's daughter reports she is more alert today than last week; once daughter leaving, pt less conversant/interactive. Per daughter, family considering comfort care measures. Will continue to follow acutely as appropriate.     If plan is discharge home, recommend the following: Two people to help with walking and/or transfers;Two people to help with bathing/dressing/bathroom;Assistance with cooking/housework;Assistance with feeding;Direct supervision/assist for medications management;Direct supervision/assist for financial management;Assist for transportation;Help with stairs or ramp for entrance;Supervision due to cognitive status   Can travel by private vehicle     No  Equipment Recommendations  Rolling walker (2 wheels);BSC/3in1;Wheelchair (measurements PT);Wheelchair cushion (measurements PT);Hospital bed;Hoyer lift    Recommendations for Other Services       Precautions / Restrictions Precautions Precautions: Fall;Other (comment) Recall of Precautions/Restrictions: Impaired Precaution/Restrictions Comments: incontinence; chronic R-side weakness & foot drop Restrictions Weight Bearing Restrictions  Per Provider Order: No     Mobility  Bed Mobility Overal bed mobility: Needs Assistance Bed Mobility: Rolling Rolling: Max assist, +2 for physical assistance, Used rails         General bed mobility comments: started with BLE PROM progressing to rolling; max-totalA for trunk and BLE management with rolling, pt reaching to bed rail with LUE when cued but requires assist to reach RUE to it, able to briefly maintain partial sidelying with UE support on bed rail    Transfers                   General transfer comment: unable to progress secondary to pain, pt also declines secondary to this    Ambulation/Gait                   Stairs             Wheelchair Mobility     Tilt Bed    Modified Rankin (Stroke Patients Only)       Balance                                            Communication Communication Communication: Impaired Factors Affecting Communication: Difficulty expressing self;Reduced clarity of speech  Cognition Arousal: Alert Behavior During Therapy: Flat affect   PT - Cognitive impairments: History of cognitive impairments                       PT - Cognition Comments: h/o dementia. daughter present and reports improved alertness compared to last week. pt answering some questions, one word answers and inconsistent. big smile when daughter mentioned family's cooking she got to eat, otherwise minimally expressive Following commands: Impaired Following commands impaired: Follows one step commands with increased time, Follows one step commands inconsistently  Cueing Cueing Techniques: Verbal cues, Gestural cues, Tactile cues, Visual cues  Exercises Other Exercises Other Exercises: increased time performing partial range BLE PROM (encouraged AAROM but no significant active movement noted from patient), including knee flex/ext, hip flex/abd/add, ankle DF; notable bilateral PF contractures and clonus Other  Exercises: RUE AAROM shoulder flex/abd, elbow flex/ext; persistent RUE swelling, elevated on pillow at end of session    General Comments General comments (skin integrity, edema, etc.): pt's daughter present at beginning of session, pt more alert interacting with her; daughter mentioning they had decided to pursue comfort care but also asking about rehab; explained role of acute PT and SNF PT, POC and likelihood that pt to be hoyer lift-dependent and even that likely to be uncomfortable given current condition; daughter hopeful for PT to continue working with patient. after session, confirmed with SW that they are pursuing comfort care      Pertinent Vitals/Pain Pain Assessment Pain Assessment: Faces Faces Pain Scale: Hurts whole lot Pain Location: BLE with AROM and repositioning (R>L) Pain Descriptors / Indicators: Grimacing, Moaning, Guarding Pain Intervention(s): Monitored during session, Limited activity within patient's tolerance, Repositioned    Home Living                          Prior Function            PT Goals (current goals can now be found in the care plan section) Acute Rehab PT Goals Patient Stated Goal: family interested in comfort care per daughter PT Goal Formulation: With family Progress towards PT goals: Not progressing toward goals - comment (limited by pain, fatigue, impaired cognition)    Frequency    Min 1X/week      PT Plan      Co-evaluation              AM-PAC PT 6 Clicks Mobility   Outcome Measure  Help needed turning from your back to your side while in a flat bed without using bedrails?: Total Help needed moving from lying on your back to sitting on the side of a flat bed without using bedrails?: Total Help needed moving to and from a bed to a chair (including a wheelchair)?: Total Help needed standing up from a chair using your arms (e.g., wheelchair or bedside chair)?: Total Help needed to walk in hospital room?:  Total Help needed climbing 3-5 steps with a railing? : Total 6 Click Score: 6    End of Session   Activity Tolerance: Patient limited by fatigue;Patient limited by pain Patient left: in bed;with call bell/phone within reach;with bed alarm set Nurse Communication: Mobility status;Need for lift equipment;Other (comment) (urinary incontinence needing total bed change) PT Visit Diagnosis: Unsteadiness on feet (R26.81);Muscle weakness (generalized) (M62.81);Other abnormalities of gait and mobility (R26.89)     Time: 9073-9049 PT Time Calculation (min) (ACUTE ONLY): 24 min  Charges:    $Therapeutic Exercise: 8-22 mins $Therapeutic Activity: 8-22 mins PT General Charges $$ ACUTE PT VISIT: 1 Visit                     Darice Almas, PT, DPT Acute Rehabilitation Services  Personal: Secure Chat Rehab Office: [phone]  Darice LITTIE Almas 04/07/2024, 11:07 AM

## 2024-04-07 NOTE — Plan of Care (Signed)

## 2024-04-07 NOTE — Plan of Care (Signed)
  Problem: Clinical Measurements: Goal: Will remain free from infection Outcome: Progressing Goal: Diagnostic test results will improve Outcome: Progressing Goal: Respiratory complications will improve Outcome: Progressing Goal: Cardiovascular complication will be avoided Outcome: Progressing   Problem: Coping: Goal: Level of anxiety will decrease Outcome: Progressing   

## 2024-04-07 NOTE — Progress Notes (Signed)
 "                        PROGRESS NOTE        PATIENT DETAILS Name: Alicia Brennan Age: 74 y.o. Sex: female Date of Birth: 1951-02-28 Admit Date: 03/31/2024 Admitting Physician Posey Maier, DO ERE:Qzops Tor Edra GRADE, MD  Brief Summary: Patient is a 74 y.o.  female with history of dementia, seizure disorder-who was transferred from [facility] for LTM EEG.  Significant events: 12/28-01/5>> hospitalization at [facility] for altered mental status-fever at [facility] workup negative-transferred to [facility] for LTM EEG. 01/5>> admit to TRH at [facility].  Significant studies: 12/28>> CT head: No acute intracranial abnormality 12/29>> CXR: No PNA 12/31>> RUE Doppler: No DVT 01/01>> MRI brain: No acute intracranial abnormality 01/03>> CXR: Left lingula subsegmental atelectasis.  Significant microbiology data: 12/28>> COVID/influenza/RSV PCR: Negative 12/29>> blood culture: No growth 12/29>> urine culture: Multiple species 12/29>> respiratory virus panel: Negative 01/03>> blood culture: No growth 01/03>> COVID/influenza/RSV PCR: Negative 01/05>> respiratory virus panel: Negative 01/05>> CSF meningitis panel: Negative 01/05>> CSF culture: Pending  Procedures: 1/5>> fluoroscopy-guided LP  Consults: Neurology Palliative  Subjective: Patient remains in bed, appears overall comfortable, extremely weak and frail at baseline, denies any headache chest or abdominal pain.  Denies any shortness of breath.  Objective: Vitals: Blood pressure (!) 173/76, pulse 73, temperature 97.9 F (36.6 C), resp. rate 20, height 5' 4 (1.626 m), weight 69 kg, SpO2 98%.   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  fever Had episodes of fever at [facility] on 1/3-1/5-none since then. Has had extensive workup-see above-no source apparent Although UA suggestive of UTI-urine cultures nondiagnostic Suspicion for aspiration pneumonitis-in the setting of AMS-completed a course of Unasyn  on 1/8.   Acute metabolic encephalopathy Thought to be secondary to infectious disease in the setting of fever-but concerned that patient could be having nonconvulsive seizures-LTM EEG negative for seizures. Overall improved-has underlying dementia-maintain delirium precautions.  Continue Keppra /Vimpat Licensed Conveyancer .  Seizure disorder See above  Mood disorder Zoloft   GERD PPI  HTN BP stable  Continue amlodipine . Will add low-dose Coreg  for better control  Hypokalemia Repleted.  Hypernatremia Likely secondary to poor oral intake Encourage oral intake Sodium has now stabilized-repeat electrolytes periodically-no longer on half-normal NS.  Dementia Delirium precautions  Palliative care Long discussion with daughter on 1/7-understands poor overall prognosis-we discussed how tube feeds really do not improve quality of life and longevity.  Subsequently evaluated by palliative care on 1/8-DNR-no tube feeds-SNF with palliative follow-up planned.   Pressure Ulcer: Agree with assessment as outlined below. Wound 03/24/24 1721 Pressure Injury Buttocks Medial Stage 2 -  Partial thickness loss of dermis presenting as a shallow open injury with a red, pink wound bed without slough. (Active)   Code status:   Code Status: Limited: Do not attempt resuscitation (DNR) -DNR-LIMITED -Do Not Intubate/DNI    DVT Prophylaxis: enoxaparin  (LOVENOX ) injection 40 mg Start: 04/01/24 1000 SCDs Start: 04/01/24 0023   Family  Communication:Daughter-[name] [phone] ([phone]  [phone] (work)-updated 1/7   Disposition Plan: Status is: Inpatient Remains inpatient appropriate because: Severity of illness   Planned Discharge Destination:Skilled nursing facility   Diet: Diet Order             Diet regular Room service appropriate? Yes with Assist; Fluid consistency: Thin  Diet effective now                     Antimicrobial agents: Anti-infectives (From admission, onward)    Start     Dose/Rate Route Frequency Ordered Stop   04/03/24 1400  Ampicillin -Sulbactam (UNASYN ) 3 g in sodium chloride  0.9 % 100 mL IVPB        3 g 200 mL/hr over 30 Minutes Intravenous Every 6 hours 04/03/24 1340 04/03/24 2144   04/01/24 1100  Ampicillin -Sulbactam (UNASYN ) 3 g in sodium chloride  0.9 % 100 mL IVPB  Status:  Discontinued        3 g 200 mL/hr over 30 Minutes Intravenous Every 6 hours 04/01/24 1003 04/03/24 1340   04/01/24 1000  cefTRIAXone  (ROCEPHIN ) 2 g in sodium chloride  0.9 % 100 mL IVPB  Status:  Discontinued        2 g 200 mL/hr over 30 Minutes Intravenous Every 12 hours 04/01/24 0137 04/01/24 0152   04/01/24 1000  acyclovir  (ZOVIRAX ) 700 mg in dextrose  5 % 100 mL IVPB  Status:  Discontinued        700 mg 114 mL/hr over 60 Minutes Intravenous Every 12 hours 04/01/24 0238 04/01/24 1002   04/01/24 1000  cefTRIAXone  (ROCEPHIN ) 2 g in sodium chloride  0.9 % 100 mL IVPB  Status:  Discontinued        2 g 200 mL/hr over 30 Minutes Intravenous Every 12 hours 04/01/24 0238 04/01/24 1002   04/01/24 1000  vancomycin  (VANCOCIN ) IVPB 1000 mg/200 mL premix  Status:  Discontinued        1,000 mg 200 mL/hr over 60 Minutes Intravenous Every 24 hours 04/01/24 0238 04/01/24 0307   04/01/24 1000  cefTRIAXone  (ROCEPHIN ) 2 g in sodium chloride  0.9 % 100 mL IVPB  Status:  Discontinued        2 g 200 mL/hr over 30 Minutes Intravenous Every 24 hours 04/01/24 0152 04/01/24 0259   04/01/24 0600  ampicillin  (OMNIPEN) 2 g in  sodium chloride  0.9 % 100 mL IVPB  Status:  Discontinued        2 g 300 mL/hr over 20 Minutes Intravenous Every 6 hours 04/01/24 0238 04/01/24 1002   04/01/24 0307  vancomycin  variable dose per unstable renal function (pharmacist dosing)  Status:  Discontinued         Does not apply See admin instructions 04/01/24 0307 04/01/24 1002        MEDICATIONS: Scheduled Meds:  amLODipine   5 mg Oral Daily   divalproex   250 mg Oral Q8H   enoxaparin  (LOVENOX ) injection  40 mg Subcutaneous Q24H   feeding supplement  237 mL Oral BID BM   lacosamide   50 mg Oral BID   levETIRAcetam   500 mg Oral BID   mouth rinse  15 mL Mouth Rinse 4 times per day   pantoprazole   40 mg Oral Daily   sertraline   25 mg Oral Daily   Continuous Infusions:   PRN Meds:.acetaminophen  **OR** acetaminophen , hydrALAZINE , labetalol , ondansetron  **OR** ondansetron  (ZOFRAN ) IV, mouth rinse   I have personally reviewed following labs and imaging studies  LABORATORY DATA: CBC: Recent Labs  Lab 04/01/24 0257 04/02/24 0341 04/03/24 0347  WBC 9.8 7.5 7.3  HGB 11.9* 10.4* 11.4*  HCT 37.7 32.3* 36.4  MCV 96.7 93.9 95.0  PLT 201 200 205    Basic Metabolic Panel: Recent Labs  Lab 04/01/24 0257 04/02/24 0341 04/03/24 0347 04/04/24 0256 04/05/24 0442 04/06/24 0414  NA 146* 145 146* 144 140 137  K 3.4* 2.9* 3.5 3.6 3.6 3.8  CL 112* 111 112* 109 108 104  CO2 22 23 25 26 25 24   GLUCOSE 81 102* 101* 101* 84 99  BUN 23 15 9  7* 7* 9  CREATININE 0.90 0.65 0.54 0.58 0.52 0.54  CALCIUM 9.5 9.0 9.0 8.8* 8.8* 8.6*  MG 2.2  --   --  1.8  --   --   PHOS 3.1  --   --  2.5  --   --     GFR: Estimated Creatinine Clearance: 57.9 mL/min (by C-G formula based on SCr of 0.54 mg/dL).  Liver Function Tests: Recent Labs  Lab 04/01/24 0257  AST 90*  ALT 41  ALKPHOS 80  BILITOT 0.4  PROT 7.0  ALBUMIN  2.7*   No results for input(s): LIPASE, AMYLASE in the last 168 hours. Recent Labs  Lab 04/01/24 0915  AMMONIA 26     Coagulation Profile: No results for input(s): INR, PROTIME in the last 168 hours.  Cardiac Enzymes: No results for input(s): CKTOTAL, CKMB, CKMBINDEX, TROPONINI in the last 168 hours.   BNP (last 3 results) Recent Labs    12/06/23 1658  PROBNP 64.3    Lipid Profile: No results for input(s): CHOL, HDL, LDLCALC, TRIG, CHOLHDL, LDLDIRECT in the last 72 hours.  Thyroid Function Tests: No results for input(s): TSH, T4TOTAL, FREET4, T3FREE, THYROIDAB in the last 72 hours.   Anemia Panel: No results for input(s): VITAMINB12, FOLATE, FERRITIN, TIBC, IRON, RETICCTPCT in the last 72 hours.   Urine analysis:    Component Value Date/Time   COLORURINE YELLOW 03/31/2024 2112   APPEARANCEUR CLOUDY (A) 03/31/2024 2112   APPEARANCEUR Clear 05/17/2013 1525   LABSPEC 1.017 03/31/2024 2112   LABSPEC 1.019 05/17/2013 1525   PHURINE 6.0 03/31/2024 2112   GLUCOSEU NEGATIVE 03/31/2024 2112   GLUCOSEU Negative 05/17/2013 1525   HGBUR SMALL (A) 03/31/2024 2112   BILIRUBINUR NEGATIVE 03/31/2024 2112   BILIRUBINUR Negative 05/17/2013 1525   KETONESUR NEGATIVE 03/31/2024 2112   PROTEINUR 30 (A) 03/31/2024 2112   NITRITE NEGATIVE 03/31/2024 2112   LEUKOCYTESUR LARGE (A) 03/31/2024 2112   LEUKOCYTESUR Trace 05/17/2013 1525    Sepsis Labs: Lactic Acid, Venous    Component Value Date/Time   LATICACIDVEN 1.0 03/24/2024 0147    MICROBIOLOGY: Recent Results (from the past 240 hours)  Resp panel by RT-PCR (RSV, Flu A&B, Covid) Anterior Nasal Swab     Status: None   Collection Time: 03/29/24  4:45 PM   Specimen: Anterior Nasal Swab  Result Value Ref Range Status   SARS Coronavirus 2 by RT PCR NEGATIVE NEGATIVE Final    Comment: (NOTE) SARS-CoV-2 target nucleic acids are NOT DETECTED.  The SARS-CoV-2 RNA is generally detectable in upper respiratory specimens during the acute phase of infection. The lowest concentration of SARS-CoV-2 viral  copies this assay can detect is 138 copies/mL. A negative result does not preclude SARS-Cov-2 infection and should not be used as the sole basis for treatment or other patient management decisions. A negative result may occur with  improper specimen collection/handling, submission of specimen other than nasopharyngeal swab, presence of viral mutation(s) within the areas targeted by  this assay, and inadequate number of viral copies(<138 copies/mL). A negative result must be combined with clinical observations, patient history, and epidemiological information. The expected result is Negative.  Fact Sheet for Patients:  bloggercourse.com  Fact Sheet for Healthcare Providers:  seriousbroker.it  This test is no t yet approved or cleared by the [place]  FDA and  has been authorized for detection and/or diagnosis of SARS-CoV-2 by FDA under an Emergency Use Authorization (EUA). This EUA will remain  in effect (meaning this test can be used) for the duration of the COVID-19 declaration under Section 564(b)(1) of the Act, 21 U.S.C.section 360bbb-3(b)(1), unless the authorization is terminated  or revoked sooner.       Influenza A by PCR NEGATIVE NEGATIVE Final   Influenza B by PCR NEGATIVE NEGATIVE Final    Comment: (NOTE) The Xpert Xpress SARS-CoV-2/FLU/RSV plus assay is intended as an aid in the diagnosis of influenza from Nasopharyngeal swab specimens and should not be used as a sole basis for treatment. Nasal washings and aspirates are unacceptable for Xpert Xpress SARS-CoV-2/FLU/RSV testing.  Fact Sheet for Patients: bloggercourse.com  Fact Sheet for Healthcare Providers: seriousbroker.it  This test is not yet approved or cleared by the [place]  FDA and has been authorized for detection and/or diagnosis of SARS-CoV-2 by FDA under an Emergency Use Authorization (EUA). This  EUA will remain in effect (meaning this test can be used) for the duration of the COVID-19 declaration under Section 564(b)(1) of the Act, 21 U.S.C. section 360bbb-3(b)(1), unless the authorization is terminated or revoked.     Resp Syncytial Virus by PCR NEGATIVE NEGATIVE Final    Comment: (NOTE) Fact Sheet for Patients: bloggercourse.com  Fact Sheet for Healthcare Providers: seriousbroker.it  This test is not yet approved or cleared by the [place]  FDA and has been authorized for detection and/or diagnosis of SARS-CoV-2 by FDA under an Emergency Use Authorization (EUA). This EUA will remain in effect (meaning this test can be used) for the duration of the COVID-19 declaration under Section 564(b)(1) of the Act, 21 U.S.C. section 360bbb-3(b)(1), unless the authorization is terminated or revoked.  Performed at [facility], [address] [place], [place] [address]   Culture, blood (Routine X 2) w Reflex to ID Panel     Status: None (Preliminary result)   Collection Time: 03/29/24  6:23 PM   Specimen: BLOOD  Result Value Ref Range Status   Specimen Description BLOOD BLOOD RIGHT HAND  Final   Special Requests   Final    BOTTLES DRAWN AEROBIC AND ANAEROBIC Blood Culture results may not be optimal due to an inadequate volume of blood received in culture bottles   Culture   Final    NO GROWTH 4 DAYS Performed at [facility], [address] [place], [place] [address]    Report Status PENDING  Incomplete  Culture, blood (Routine X 2) w Reflex to ID Panel     Status: None (Preliminary result)   Collection Time: 03/29/24 11:16 PM   Specimen: BLOOD RIGHT HAND  Result Value Ref Range Status   Specimen Description BLOOD RIGHT HAND  Final   Special Requests   Final    BOTTLES DRAWN AEROBIC ONLY Blood Culture adequate volume   Culture   Final    NO GROWTH 4 DAYS Performed at [facility], [address] [place], [place] [address]    Report Status PENDING  Incomplete  Respiratory (~20 pathogens) panel by PCR  Status: None   Collection Time: 03/31/24  9:00 AM   Specimen: Nasopharyngeal Swab; Respiratory  Result Value Ref Range Status   Adenovirus NOT DETECTED NOT DETECTED Final   Coronavirus 229E NOT DETECTED NOT DETECTED Final    Comment: (NOTE) The Coronavirus on the Respiratory Panel, DOES NOT test for the novel  Coronavirus (2019 nCoV)    Coronavirus HKU1 NOT DETECTED NOT DETECTED Final   Coronavirus NL63 NOT DETECTED NOT DETECTED Final   Coronavirus OC43 NOT DETECTED NOT DETECTED Final   Metapneumovirus NOT DETECTED NOT DETECTED Final   Rhinovirus / Enterovirus NOT DETECTED NOT DETECTED Final   Influenza A NOT DETECTED NOT DETECTED Final   Influenza B NOT DETECTED NOT DETECTED Final   Parainfluenza Virus 1 NOT DETECTED NOT DETECTED Final   Parainfluenza Virus 2 NOT DETECTED NOT DETECTED Final   Parainfluenza Virus 3 NOT DETECTED NOT DETECTED Final   Parainfluenza Virus 4 NOT DETECTED NOT DETECTED Final   Respiratory Syncytial Virus NOT DETECTED NOT DETECTED Final   Bordetella pertussis NOT DETECTED NOT DETECTED Final   Bordetella Parapertussis NOT DETECTED NOT DETECTED Final   Chlamydophila pneumoniae NOT DETECTED NOT DETECTED Final   Mycoplasma pneumoniae NOT DETECTED NOT DETECTED Final    Comment: Performed at [facility], [address], [place], [place] [address]  CSF culture w Gram Stain     Status: None   Collection Time: 03/31/24 11:07 AM   Specimen: PATH Cytology CSF; Cerebrospinal Fluid  Result Value Ref Range Status   Specimen Description   Final    CSF Performed at [facility], [address], [place], [place] [address]    Special Requests   Final    C2 Performed at [facility], [address] [place], [place] [address]    Gram Stain   Final    WBC SEEN RED BLOOD CELLS NO ORGANISMS SEEN Performed at [facility], [address], [place], [place] [address]    Culture   Final    NO GROWTH 3 DAYS Performed at [facility], [address] [place], [place] [address]    Report Status 04/03/2024 FINAL  Final  Fungus Culture With Stain     Status: None (Preliminary result)   Collection Time: 03/31/24 11:07 AM   Specimen: PATH Cytology CSF; Cerebrospinal Fluid  Result Value Ref Range Status   Fungus Stain Final report  Final    Comment: (NOTE) Performed At: [facility] [address] [place], [place] [id number] Jennette Shorter MD Ey:[id number]    Fungus (Mycology) Culture PENDING  Incomplete   Fungal Source CSF  Final    Comment: TUBE 1 Performed at [facility], [address] [place], [place] [address]   Fungus Culture Result     Status: None   Collection Time: 03/31/24 11:07 AM  Result Value Ref Range Status   Result 1 Comment  Final    Comment: (NOTE) KOH/Calcofluor preparation:  no fungus observed. Performed At: [facility] [address] [place], [place] [id number] Jennette Shorter MD Ey:[id number]     RADIOLOGY STUDIES/RESULTS: No results found.    LOS: 7 days   Lavada Stank, MD  [facility]    To contact the attending provider between 7A-7P or the covering provider during after hours 7P-7A, please log into the web site [email or web address] and access using universal [place] password for that web site. If you do not have the password, please call the hospital operator.  04/07/2024, 10:46 AM    "

## 2024-04-07 NOTE — TOC Progression Note (Signed)
 Transition of Care [facility]) - Progression Note    Patient Details  Name: Alicia Brennan MRN: [id number] Date of Birth: 06-Mar-1951  Transition of Care [facility]) CM/SW Contact  [name], LCSW Phone Number: 04/07/2024, 2:37 PM  Clinical Narrative:   Daughter planning to tour [name] (they would have patient pay her check on 2/1).     Expected Discharge Plan: Skilled Nursing Facility Barriers to Discharge: Insurance Authorization, SNF Pending bed offer, Continued Medical Work up               Expected Discharge Plan and Services In-house Referral: Clinical Social Work   Post Acute Care Choice: Skilled Nursing Facility Living arrangements for the past 2 months: Skilled Nursing Facility                                       Social Drivers of Health (SDOH) Interventions SDOH Screenings   Food Insecurity: Patient Unable To Answer (03/31/2024)  Recent Concern: Food Insecurity - Food Insecurity Present (01/31/2024)  Housing: Patient Unable To Answer (03/31/2024)  Transportation Needs: No Transportation Needs (01/31/2024)  Utilities: Not At Risk (01/31/2024)  Financial Resource Strain: Low Risk (11/16/2023)   Received from [facility]  Social Connections: Unknown (03/31/2024)  Recent Concern: Social Connections - Moderately Isolated (01/31/2024)  Tobacco Use: Low Risk (04/01/2024)    Readmission Risk Interventions     No data to display

## 2024-04-07 NOTE — Progress Notes (Signed)
 Hydralazine  iv given for bp 180/76 ( 106). Rechecked bp 150/65.  Alicia Brennan GORMAN Arabia, RN

## 2024-04-08 LAB — CREATININE, SERUM
Creatinine, Ser: 0.55 mg/dL (ref 0.44–1.00)
GFR, Estimated: >60 mL/min

## 2024-04-08 NOTE — Progress Notes (Signed)
 Occupational Therapy Treatment Patient Details Name: Alicia Brennan MRN: [id number] DOB: 05/15/1950 Today's Date: 04/08/2024   History of present illness 74 y.o. female admitted from SNF to [facility] on 03/24/24 with AMS, fever, concerns for aspiration. Transfer to [facility] for LTM EEG, negative for seizures. Head CT/MRI negative for acute injury. RUE swelling; imaging negative for DVT. CXR 1/3 with atelectasis. Concern for aspiration pneumonitis. Fluoroscopy-guided LP 1/5. 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   OT comments  Patient willing to attempt EOB sitting this session.  Facial grimace with bed mobility, but unable to articulate location.  Patient answering a few questions about her daughter.  Sat EOB with constant support to maintain upright sitting.  Unable to reach her face, and thus total assist for grooming.  OT downgraded initial goals, and frequency reduced to 1x/wk.  OT will check on next week, family is meeting with hospice to determine goals of care.  Plan is for transition to  SNF, ? Short term rehab versus long term care.        If plan is discharge home, recommend the following:  Two people to help with walking and/or transfers;Two people to help with bathing/dressing/bathroom   Equipment Recommendations  None recommended by OT    Recommendations for Other Services      Precautions / Restrictions Precautions Precautions: Fall;Other (comment) Recall of Precautions/Restrictions: Impaired Precaution/Restrictions Comments: incontinence; chronic R-side weakness/stiff & foot drop Restrictions Weight Bearing Restrictions Per Provider Order: No       Mobility Bed Mobility Overal bed mobility: Needs Assistance Bed Mobility: Supine to Sit, Sit to Supine, Rolling Rolling: Total assist   Supine to sit: Total assist Sit to supine: Total assist        Transfers                         Balance  Overall balance assessment: Needs assistance Sitting-balance support: Feet supported, Bilateral upper extremity supported Sitting balance-Leahy Scale: Poor   Postural control: Posterior lean, Right lateral lean                                 ADL either performed or assessed with clinical judgement   ADL   Eating/Feeding: Total assistance;Bed level   Grooming: Total assistance;Bed level                                      Extremity/Trunk Assessment Upper Extremity Assessment Upper Extremity Assessment: Left hand dominant RUE Deficits / Details: Global generalized weakness, ?tone versus stiffness to R elbow and shoulder RUE Coordination: decreased fine motor;decreased gross motor LUE Deficits / Details: able to reach with L hand, but unable to reach her mouth LUE Coordination: decreased fine motor;decreased gross motor   Lower Extremity Assessment Lower Extremity Assessment: Defer to PT evaluation   Cervical / Trunk Assessment Cervical / Trunk Assessment: Kyphotic    Vision   Vision Assessment?: No apparent visual deficits   Perception Perception Perception: Not tested   Praxis Praxis Praxis: Not tested   Communication Communication Communication: Impaired Factors Affecting Communication: Difficulty expressing self;Reduced clarity of speech   Cognition Arousal: Alert Behavior During Therapy: Flat affect Cognition: History of cognitive impairments  Following commands: Impaired Following commands impaired: Follows one step commands inconsistently      Cueing   Cueing Techniques: Verbal cues, Gestural cues, Tactile cues  Exercises      Shoulder Instructions       General Comments  VSS on RA    Pertinent Vitals/ Pain       Pain Assessment Pain Assessment: Faces Faces Pain Scale: Hurts little more Pain Location: BLE with AROM and repositioning (R>L) Pain Descriptors / Indicators:  Grimacing, Moaning, Guarding Pain Intervention(s): Monitored during session                                                          Frequency  Min 1X/week        Progress Toward Goals  OT Goals(current goals can now be found in the care plan section)  Progress towards OT goals: Goals drowngraded-see care plan  Acute Rehab OT Goals OT Goal Formulation: Patient unable to participate in goal setting Time For Goal Achievement: 04/22/24 Potential to Achieve Goals: Fair ADL Goals Pt Will Perform Eating: with mod assist;bed level Pt Will Perform Grooming: with mod assist;bed level  Plan      Co-evaluation                 AM-PAC OT 6 Clicks Daily Activity     Outcome Measure   Help from another person eating meals?: Total Help from another person taking care of personal grooming?: Total Help from another person toileting, which includes using toliet, bedpan, or urinal?: Total Help from another person bathing (including washing, rinsing, drying)?: Total Help from another person to put on and taking off regular upper body clothing?: Total Help from another person to put on and taking off regular lower body clothing?: Total 6 Click Score: 6    End of Session    OT Visit Diagnosis: Other abnormalities of gait and mobility (R26.89);Muscle weakness (generalized) (M62.81);Other symptoms and signs involving the nervous system (R29.898);Other symptoms and signs involving cognitive function   Activity Tolerance Patient limited by fatigue   Patient Left in bed;with call bell/phone within reach;with bed alarm set   Nurse Communication Need for lift equipment        Time: 1311-1335 OT Time Calculation (min): 24 min  Charges: OT General Charges $OT Visit: 1 Visit OT Treatments $Self Care/Home Management : 8-22 mins $Therapeutic Activity: 8-22 mins  04/08/2024  RP, OTR/L  Acute Rehabilitation Services  Office:  [phone]   Alicia Brennan 04/08/2024, 1:41 PM

## 2024-04-08 NOTE — Plan of Care (Signed)

## 2024-04-08 NOTE — Progress Notes (Signed)
 Palliative:  HPI: 74 y.o. 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 Was at [facility] for fever; extensive work up was negative. Has not had fevers since 1/5. Suspicion she had aspiration pneumonitis. LTM EEG was negative. Overall has improved. Neurology recommended continuation of home AEDs. PMT consulted for GOC discussion.    Received call from daughter yesterday requesting further conversation. I reviewed records, palliative consultation, labs, MAR. Noted ongoing conversations with attending and CSW. Noted ongoing decline with underlying dementia and poor intake. I met with Alicia Brennan but no family at bedside. She is lying in bed. She speaks to me. Pleasantly confused. No distress. Resting peacefully. She denies any complaints or discomfort.   I called and introduced myself to daughter, Alicia Brennan. [name] confirms she wishes to speak further. She works here in the hospital but we are unable to meet today. She plans to be here tomorrow and will call me so we can meet and discuss further.   All questions/concerns addressed. Further discussion tomorrow.   Exam: Alert, pleasantly confused. No distress. Breathing regular, unlabored. Abd soft. Generalized weakness and fatigue.   Plan: - DNR in place - Will have further discussion with daughter tomorrow per request  25 min  [name], NP Palliative Medicine Team Pager [phone] (Please see [email or web address] for schedule) Team Phone [phone]

## 2024-04-08 NOTE — TOC Progression Note (Signed)
 Transition of Care [facility]) - Progression Note    Patient Details  Name: Alicia Brennan MRN: [id number] Date of Birth: [birth year]  [facility] CM/SW Contact  Alicia GORMAN Kindle, LCSW Phone Number: 04/08/2024, 1:00 PM  Clinical Narrative:    CSW spoke with patient's daughter. She reported she was able to tour [facility] but they do not have a bed available yet. She asked CSW about Palliative versus Hospice and CSW answered questions. She reported interest in speaking with Hospice to ask questions. CSW sent referral to [facility] to see if they can assist Ne[facility]a is in contract with them).   Daughter requested [facility] review referral; facility declined patient.    Expected Discharge Plan: Skilled Nursing Facility Barriers to Discharge: Insurance Authorization, SNF Pending bed offer, Continued Medical Work up               Expected Discharge Plan and Services In-house Referral: Clinical Social Work   Post Acute Care Choice: Skilled Nursing Facility Living arrangements for the past 2 months: Skilled Nursing Facility                                       Social Drivers of Health (SDOH) Interventions SDOH Screenings   Food Insecurity: Patient Unable To Answer (03/31/2024)  Recent Concern: Food Insecurity - Food Insecurity Present (01/31/2024)  Housing: Patient Unable To Answer (03/31/2024)  Transportation Needs: No Transportation Needs (01/31/2024)  Utilities: Not At Risk (01/31/2024)  Financial Resource Strain: Low Risk (11/16/2023)   Received from [facility]  Social Connections: Unknown (03/31/2024)  Recent Concern: Social Connections - Moderately Isolated (01/31/2024)  Tobacco Use: Low Risk (04/01/2024)    Readmission Risk Interventions     No data to display

## 2024-04-08 NOTE — Plan of Care (Addendum)
" °  Problem: Education: Goal: Knowledge of General Education information will improve Description: Including pain rating scale, medication(s)/side effects and non-pharmacologic comfort measures Outcome: Not Progressing   Problem: Health Behavior/Discharge Planning: Goal: Ability to manage health-related needs will improve Outcome: Not Progressing   Problem: Activity: Goal: Risk for activity intolerance will decrease Outcome: Not Progressing   Patient w/ poor appetite during shift today, declined her meals, able to drink 1 coke and 1/2 of ensure after lunch with lots of encouragement. Nurse updated daughter during visit. Please see her VS. She appears in no distress during nursing round.  "

## 2024-04-08 NOTE — Progress Notes (Signed)
 "                        PROGRESS NOTE        PATIENT DETAILS Name: Alicia Brennan Age: 74 y.o. Sex: female Date of Birth: 01-21-1951 Admit Date: 03/31/2024 Admitting Physician Posey Maier, DO ERE:Qzops Tor Edra GRADE, MD  Brief Summary: Patient is a 74 y.o.  female with history of dementia, seizure disorder-who was transferred from [facility] for LTM EEG.  Significant events: 12/28-01/5>> hospitalization at [facility] for altered mental status-fever at [facility] workup negative-transferred to [facility] for LTM EEG. 01/5>> admit to TRH at [facility].  Significant studies: 12/28>> CT head: No acute intracranial abnormality 12/29>> CXR: No PNA 12/31>> RUE Doppler: No DVT 01/01>> MRI brain: No acute intracranial abnormality 01/03>> CXR: Left lingula subsegmental atelectasis.  Significant microbiology data: 12/28>> COVID/influenza/RSV PCR: Negative 12/29>> blood culture: No growth 12/29>> urine culture: Multiple species 12/29>> respiratory virus panel: Negative 01/03>> blood culture: No growth 01/03>> COVID/influenza/RSV PCR: Negative 01/05>> respiratory virus panel: Negative 01/05>> CSF meningitis panel: Negative 01/05>> CSF culture: Pending  Procedures: 1/5>> fluoroscopy-guided LP  Consults: Neurology Palliative  Subjective:  Patient in bed, appears comfortable, denies any headache, no fever, no chest pain or pressure, no shortness of breath , no abdominal pain. No focal weakness.  Objective: Vitals: Blood pressure 124/64, pulse 78, temperature 98.6 F (37 C), temperature source Oral, resp. rate 20, height 5' 4 (1.626 m), weight 69 kg, SpO2 96%.   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  fever Had episodes of fever at [facility] on 1/3-1/5-none since then. Has had extensive workup-see above-no source apparent Although UA suggestive of UTI-urine cultures nondiagnostic Suspicion for aspiration pneumonitis-in the setting of AMS-completed a course of Unasyn  on 1/8.   Acute metabolic encephalopathy Thought to be secondary to infectious disease in the setting of fever-but concerned that patient could be having nonconvulsive seizures-LTM EEG negative for seizures. Overall improved-has underlying dementia-maintain delirium precautions.  Continue Keppra /Vimpat /Depakote .  Seizure disorder See above  Mood disorder Zoloft   GERD PPI  HTN BP stable  Continue amlodipine . Will add low-dose Coreg  for better control  Hypokalemia Repleted.  Hypernatremia Likely secondary to poor oral intake Encourage oral intake Sodium has now stabilized-repeat electrolytes periodically-no longer on half-normal NS.  Dementia Delirium precautions  Palliative care Long discussion with daughter on 1/7-understands poor overall prognosis-we discussed how tube feeds really do not improve quality of life and longevity.  Subsequently evaluated by palliative care on 1/8-DNR-no tube feeds-SNF with palliative follow-up planned.   Pressure Ulcer: Agree with assessment as outlined below. Wound 03/24/24 1721 Pressure Injury Buttocks Medial Stage 2 -  Partial thickness loss of dermis presenting as a shallow open injury with a red, pink wound bed without slough. (Active)   Code status:   Code Status: Limited: Do not attempt resuscitation (DNR) -DNR-LIMITED -Do Not Intubate/DNI    DVT Prophylaxis: enoxaparin  (LOVENOX ) injection 40 mg Start: 04/01/24 1000 SCDs Start: 04/01/24 0023   Family  Communication:Daughter-[name]  [phone] ([phone] (work)-updated 1/7   Disposition Plan: Status is: Inpatient Remains inpatient appropriate because: Severity of illness   Planned Discharge Destination:Skilled nursing facility   Diet: Diet Order             Diet regular Room service appropriate? Yes with Assist; Fluid consistency: Thin  Diet effective now                     Antimicrobial agents: Anti-infectives (From admission, onward)    Start     Dose/Rate Route Frequency Ordered Stop   04/03/24 1400  Ampicillin -Sulbactam (UNASYN ) 3 g in sodium chloride  0.9 % 100 mL IVPB        3 g 200 mL/hr over 30 Minutes Intravenous Every 6 hours 04/03/24 1340 04/03/24 2144   04/01/24 1100  Ampicillin -Sulbactam (UNASYN ) 3 g in sodium chloride  0.9 % 100 mL IVPB  Status:  Discontinued        3 g 200 mL/hr over 30 Minutes Intravenous Every 6 hours 04/01/24 1003 04/03/24 1340   04/01/24 1000  cefTRIAXone  (ROCEPHIN ) 2 g in sodium chloride  0.9 % 100 mL IVPB  Status:  Discontinued        2 g 200 mL/hr over 30 Minutes Intravenous Every 12 hours 04/01/24 0137 04/01/24 0152   04/01/24 1000  acyclovir  (ZOVIRAX ) 700 mg in dextrose  5 % 100 mL IVPB  Status:  Discontinued        700 mg 114 mL/hr over 60 Minutes Intravenous Every 12 hours 04/01/24 0238 04/01/24 1002   04/01/24 1000  cefTRIAXone  (ROCEPHIN ) 2 g in sodium chloride  0.9 % 100 mL IVPB  Status:  Discontinued        2 g 200 mL/hr over 30 Minutes Intravenous Every 12 hours 04/01/24 0238 04/01/24 1002   04/01/24 1000  vancomycin  (VANCOCIN ) IVPB 1000 mg/200 mL premix  Status:  Discontinued        1,000 mg 200 mL/hr over 60 Minutes Intravenous Every 24 hours 04/01/24 0238 04/01/24 0307   04/01/24 1000  cefTRIAXone  (ROCEPHIN ) 2 g in sodium chloride  0.9 % 100 mL IVPB  Status:  Discontinued        2 g 200 mL/hr over 30 Minutes Intravenous Every 24 hours 04/01/24 0152 04/01/24 0259   04/01/24 0600  ampicillin  (OMNIPEN) 2 g in  sodium chloride  0.9 % 100 mL IVPB  Status:  Discontinued        2 g 300 mL/hr over 20 Minutes Intravenous Every 6 hours 04/01/24 0238 04/01/24 1002   04/01/24 0307  vancomycin  variable dose per unstable renal function (pharmacist dosing)  Status:  Discontinued         Does not apply See admin instructions 04/01/24 0307 04/01/24 1002        MEDICATIONS: Scheduled Meds:  amLODipine   5 mg Oral Daily   carvedilol   3.125 mg Oral BID WC   divalproex   250 mg Oral Q8H   enoxaparin  (LOVENOX ) injection  40 mg Subcutaneous Q24H   feeding supplement  237 mL Oral BID BM   lacosamide   50 mg Oral BID   levETIRAcetam   500 mg Oral BID   mouth rinse  15 mL Mouth Rinse 4 times per day   pantoprazole   40 mg Oral Daily   sertraline   25 mg Oral Daily   Continuous Infusions:   PRN Meds:.acetaminophen  **OR** acetaminophen , hydrALAZINE , labetalol , ondansetron  **OR** ondansetron  (ZOFRAN ) IV, mouth rinse   I have personally reviewed following labs and imaging studies  LABORATORY  DATA: CBC: Recent Labs  Lab 04/02/24 0341 04/03/24 0347  WBC 7.5 7.3  HGB 10.4* 11.4*  HCT 32.3* 36.4  MCV 93.9 95.0  PLT 200 205    Basic Metabolic Panel: Recent Labs  Lab 04/02/24 0341 04/03/24 0347 04/04/24 0256 04/05/24 0442 04/06/24 0414 04/08/24 0651  NA 145 146* 144 140 137  --   K 2.9* 3.5 3.6 3.6 3.8  --   CL 111 112* 109 108 104  --   CO2 23 25 26 25 24   --   GLUCOSE 102* 101* 101* 84 99  --   BUN 15 9 7* 7* 9  --   CREATININE 0.65 0.54 0.58 0.52 0.54 0.55  CALCIUM 9.0 9.0 8.8* 8.8* 8.6*  --   MG  --   --  1.8  --   --   --   PHOS  --   --  2.5  --   --   --     GFR: Estimated Creatinine Clearance: 57.9 mL/min (by C-G formula based on SCr of 0.55 mg/dL).  Liver Function Tests: No results for input(s): AST, ALT, ALKPHOS, BILITOT, PROT, ALBUMIN  in the last 168 hours.  No results for input(s): LIPASE, AMYLASE in the last 168 hours. Recent Labs  Lab 04/01/24 0915  AMMONIA  26    Coagulation Profile: No results for input(s): INR, PROTIME in the last 168 hours.  Cardiac Enzymes: No results for input(s): CKTOTAL, CKMB, CKMBINDEX, TROPONINI in the last 168 hours.   BNP (last 3 results) Recent Labs    12/06/23 1658  PROBNP 64.3    Lipid Profile: No results for input(s): CHOL, HDL, LDLCALC, TRIG, CHOLHDL, LDLDIRECT in the last 72 hours.  Thyroid Function Tests: No results for input(s): TSH, T4TOTAL, FREET4, T3FREE, THYROIDAB in the last 72 hours.   Anemia Panel: No results for input(s): VITAMINB12, FOLATE, FERRITIN, TIBC, IRON, RETICCTPCT in the last 72 hours.   Urine analysis:    Component Value Date/Time   COLORURINE YELLOW 03/31/2024 2112   APPEARANCEUR CLOUDY (A) 03/31/2024 2112   APPEARANCEUR Clear 05/17/2013 1525   LABSPEC 1.017 03/31/2024 2112   LABSPEC 1.019 05/17/2013 1525   PHURINE 6.0 03/31/2024 2112   GLUCOSEU NEGATIVE 03/31/2024 2112   GLUCOSEU Negative 05/17/2013 1525   HGBUR SMALL (A) 03/31/2024 2112   BILIRUBINUR NEGATIVE 03/31/2024 2112   BILIRUBINUR Negative 05/17/2013 1525   KETONESUR NEGATIVE 03/31/2024 2112   PROTEINUR 30 (A) 03/31/2024 2112   NITRITE NEGATIVE 03/31/2024 2112   LEUKOCYTESUR LARGE (A) 03/31/2024 2112   LEUKOCYTESUR Trace 05/17/2013 1525    Sepsis Labs: Lactic Acid, Venous    Component Value Date/Time   LATICACIDVEN 1.0 03/24/2024 0147    MICROBIOLOGY: Recent Results (from the past 240 hours)  Resp panel by RT-PCR (RSV, Flu A&B, Covid) Anterior Nasal Swab     Status: None   Collection Time: 03/29/24  4:45 PM   Specimen: Anterior Nasal Swab  Result Value Ref Range Status   SARS Coronavirus 2 by RT PCR NEGATIVE NEGATIVE Final    Comment: (NOTE) SARS-CoV-2 target nucleic acids are NOT DETECTED.  The SARS-CoV-2 RNA is generally detectable in upper respiratory specimens during the acute phase of infection. The lowest concentration of SARS-CoV-2  viral copies this assay can detect is 138 copies/mL. A negative result does not preclude SARS-Cov-2 infection and should not be used as the sole basis for treatment or other patient management decisions. A negative result may occur with  improper specimen collection/handling, submission of  specimen other than nasopharyngeal swab, presence of viral mutation(s) within the areas targeted by this assay, and inadequate number of viral copies(<138 copies/mL). A negative result must be combined with clinical observations, patient history, and epidemiological information. The expected result is Negative.  Fact Sheet for Patients:  bloggercourse.com  Fact Sheet for Healthcare Providers:  seriousbroker.it  This test is no t yet approved or cleared by the [place]  FDA and  has been authorized for detection and/or diagnosis of SARS-CoV-2 by FDA under an Emergency Use Authorization (EUA). This EUA will remain  in effect (meaning this test can be used) for the duration of the COVID-19 declaration under Section 564(b)(1) of the Act, 21 U.S.C.section 360bbb-3(b)(1), unless the authorization is terminated  or revoked sooner.       Influenza A by PCR NEGATIVE NEGATIVE Final   Influenza B by PCR NEGATIVE NEGATIVE Final    Comment: (NOTE) The Xpert Xpress SARS-CoV-2/FLU/RSV plus assay is intended as an aid in the diagnosis of influenza from Nasopharyngeal swab specimens and should not be used as a sole basis for treatment. Nasal washings and aspirates are unacceptable for Xpert Xpress SARS-CoV-2/FLU/RSV testing.  Fact Sheet for Patients: bloggercourse.com  Fact Sheet for Healthcare Providers: seriousbroker.it  This test is not yet approved or cleared by the [place]  FDA and has been authorized for detection and/or diagnosis of SARS-CoV-2 by FDA under an Emergency Use Authorization  (EUA). This EUA will remain in effect (meaning this test can be used) for the duration of the COVID-19 declaration under Section 564(b)(1) of the Act, 21 U.S.C. section 360bbb-3(b)(1), unless the authorization is terminated or revoked.     Resp Syncytial Virus by PCR NEGATIVE NEGATIVE Final    Comment: (NOTE) Fact Sheet for Patients: bloggercourse.com  Fact Sheet for Healthcare Providers: seriousbroker.it  This test is not yet approved or cleared by the [place]  FDA and has been authorized for detection and/or diagnosis of SARS-CoV-2 by FDA under an Emergency Use Authorization (EUA). This EUA will remain in effect (meaning this test can be used) for the duration of the COVID-19 declaration under Section 564(b)(1) of the Act, 21 U.S.C. section 360bbb-3(b)(1), unless the authorization is terminated or revoked.  Performed at [facility], [address] [place], [place] [address]   Culture, blood (Routine X 2) w Reflex to ID Panel     Status: None   Collection Time: 03/29/24  6:23 PM   Specimen: BLOOD  Result Value Ref Range Status   Specimen Description BLOOD BLOOD RIGHT HAND  Final   Special Requests   Final    BOTTLES DRAWN AEROBIC AND ANAEROBIC Blood Culture results may not be optimal due to an inadequate volume of blood received in culture bottles   Culture   Final    NO GROWTH 9 DAYS Performed at [facility], [address] [place], [place] [address]    Report Status 04/07/2024 FINAL  Final  Culture, blood (Routine X 2) w Reflex to ID Panel     Status: None   Collection Time: 03/29/24 11:16 PM   Specimen: BLOOD RIGHT HAND  Result Value Ref Range Status   Specimen Description BLOOD RIGHT HAND  Final   Special Requests   Final    BOTTLES DRAWN AEROBIC ONLY Blood Culture adequate volume   Culture   Final    NO GROWTH 9 DAYS Performed at [facility], [address], [place],  [place] [address]    Report Status 04/07/2024 FINAL  Final  Respiratory (~20 pathogens) panel by PCR     Status: None   Collection Time: 03/31/24  9:00 AM   Specimen: Nasopharyngeal Swab; Respiratory  Result Value Ref Range Status   Adenovirus NOT DETECTED NOT DETECTED Final   Coronavirus 229E NOT DETECTED NOT DETECTED Final    Comment: (NOTE) The Coronavirus on the Respiratory Panel, DOES NOT test for the novel  Coronavirus (2019 nCoV)    Coronavirus HKU1 NOT DETECTED NOT DETECTED Final   Coronavirus NL63 NOT DETECTED NOT DETECTED Final   Coronavirus OC43 NOT DETECTED NOT DETECTED Final   Metapneumovirus NOT DETECTED NOT DETECTED Final   Rhinovirus / Enterovirus NOT DETECTED NOT DETECTED Final   Influenza A NOT DETECTED NOT DETECTED Final   Influenza B NOT DETECTED NOT DETECTED Final   Parainfluenza Virus 1 NOT DETECTED NOT DETECTED Final   Parainfluenza Virus 2 NOT DETECTED NOT DETECTED Final   Parainfluenza Virus 3 NOT DETECTED NOT DETECTED Final   Parainfluenza Virus 4 NOT DETECTED NOT DETECTED Final   Respiratory Syncytial Virus NOT DETECTED NOT DETECTED Final   Bordetella pertussis NOT DETECTED NOT DETECTED Final   Bordetella Parapertussis NOT DETECTED NOT DETECTED Final   Chlamydophila pneumoniae NOT DETECTED NOT DETECTED Final   Mycoplasma pneumoniae NOT DETECTED NOT DETECTED Final    Comment: Performed at [facility], [address] [place], [place] [address]  CSF culture w Gram Stain     Status: None   Collection Time: 03/31/24 11:07 AM   Specimen: PATH Cytology CSF; Cerebrospinal Fluid  Result Value Ref Range Status   Specimen Description   Final    CSF Performed at [facility], [address] [place], [place] [address]    Special Requests   Final    C2 Performed at [facility], [address] [place], [place] [address]    Gram Stain   Final    WBC SEEN RED BLOOD CELLS NO ORGANISMS SEEN Performed at [facility], [address], [place], [place] [address]    Culture   Final    NO GROWTH 3 DAYS Performed at [facility], [address], [place], [place] [address]    Report Status 04/03/2024 FINAL  Final  Fungus Culture With Stain     Status: None (Preliminary result)   Collection Time: 03/31/24 11:07 AM   Specimen: PATH Cytology CSF; Cerebrospinal Fluid  Result Value Ref Range Status   Fungus Stain Final report  Final    Comment: (NOTE) Performed At: [facility] [address] [place], [place] [id number] Jennette Shorter MD Ey:[id number]    Fungus (Mycology) Culture PENDING  Incomplete   Fungal Source CSF  Final    Comment: TUBE 1 Performed at [facility], [address] [place], [place] [address]   Fungus Culture Result     Status: None   Collection Time: 03/31/24 11:07 AM  Result Value Ref Range Status   Result 1 Comment  Final    Comment: (NOTE) KOH/Calcofluor preparation:  no fungus observed. Performed At: [facility] [address] [place], [place] [id number] Jennette Shorter MD Ey:[id number]     RADIOLOGY STUDIES/RESULTS: No results found.    LOS: 8 days   Lavada Stank, MD  [facility]    To contact the attending provider between 7A-7P or the covering provider during after hours 7P-7A, please log into the web site [email or web address] and access using universal [place] password for that web site. If  you do not have the password, please call the hospital operator.  04/08/2024, 9:14 AM    "

## 2024-04-08 NOTE — Progress Notes (Signed)
 Patient declined breakfast this morning as RN attempted to feed her.

## 2024-04-09 DIAGNOSIS — Z7189 Other specified counseling: Secondary | ICD-10-CM

## 2024-04-09 DIAGNOSIS — Z515 Encounter for palliative care: Secondary | ICD-10-CM

## 2024-04-09 NOTE — TOC Progression Note (Signed)
 Transition of Care [facility]) - Progression Note    Patient Details  Name: Alicia Brennan MRN: [id number] Date of Birth: October 24, 1950  [facility] CM/SW Contact  [name], LCSW Phone Number: 04/09/2024, 3:28 PM  Clinical Narrative:    CSW received call from daughter stating she spoke with palliative and will have to go with [facility] though she wishes she can take her mom home.  [facility] does not yet have a bed available. CSW checking with [facility], [facility], and [facility]. No other LTC Medicaid beds open at this time.    Expected Discharge Plan: Skilled Nursing Facility Barriers to Discharge: Insurance Authorization, SNF Pending bed offer, Continued Medical Work up               Expected Discharge Plan and Services In-house Referral: Clinical Social Work   Post Acute Care Choice: Skilled Nursing Facility Living arrangements for the past 2 months: Skilled Nursing Facility                                       Social Drivers of Health (SDOH) Interventions SDOH Screenings   Food Insecurity: Patient Unable To Answer (03/31/2024)  Recent Concern: Food Insecurity - Food Insecurity Present (01/31/2024)  Housing: Patient Unable To Answer (03/31/2024)  Transportation Needs: No Transportation Needs (01/31/2024)  Utilities: Not At Risk (01/31/2024)  Financial Resource Strain: Low Risk (11/16/2023)   Received from [facility]  Social Connections: Unknown (03/31/2024)  Recent Concern: Social Connections - Moderately Isolated (01/31/2024)  Tobacco Use: Low Risk (04/01/2024)    Readmission Risk Interventions     No data to display

## 2024-04-09 NOTE — Progress Notes (Signed)
 Palliative:  HPI: 74 y.o. 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 Was at [facility] for fever; extensive work up was negative. Has not had fevers since 1/5. Suspicion she had aspiration pneumonitis. LTM EEG was negative. Overall has improved. Neurology recommended continuation of home AEDs. PMT consulted for GOC discussion.    I discussed with Dr. Dennise and CSW Alicia Brennan. Alicia Brennan is sleeping comfortably and I did not awaken her. No family or visitors at bedside.   I was able to connect with daughter, Alicia Brennan, over phone. She is very overwhelmed with her mother's quick decline and the decisions that have come her way. Alicia Brennan has also just began a new job which is adding to her stress with her mother's illness. Alicia Brennan asks about aggressive care and what this really means. We discussed that aggressive care is usually care that is invasive and would include interventions such as resuscitation, central line access and medications, ICU level care, surgeries/procedures, etc. I shared that we usually categorize aggressive care as measures we take when someone is critically ill and can often mean a poor outcome could occur. We reviewed the level of care she would want for her mother. I discussed with her MOST form and how this can help delineate their wishes. We talked through MOST form and Alicia Brennan confirms DNR/DNI, no feeding tube. She shares that she would want her mother to come back to the hospital and have IVF, antibiotics, telemetry as indicated. She shares that decided DNR and no feeding tube were very difficult decisions. We discussed the side effects and discomfort and suffering that often comes from these interventions. She does not want her mother to suffer. I encouraged her to think of these decisions  as her way of protecting her mother from suffering and harm instead of things we are not providing her acknowledging these interventions would not add to her mother's quality of life. She expresses understanding. She was not emotionally prepared to make these types of decisions for her mother. I will leave MOST form at bedside for her to review - I checked the measures we discussed on form.   Alicia Brennan is very concerned about the lack of options for facility care for her mother. I did share this is a difficult process. Unfortunately it is very common that options are scarce for long term care. I also confirmed that we do not always know why a facility declines a patient as they do not always give us  this feedback but this can be from any number of reasons. [name] understands but also worries about her mother's care. She wants to ensure her mother will have range of motion, be kept clean, and assistance with feeding. She is very sad that she is not in a position to care for her mother at home. She understands and plans to reach back out to CSW Alicia Brennan. She confirms desire for hospice to follow at facility.   All questions/concerns addressed to the best of my ability. Emotional support provided.   Exam: Sleeping peacefully. No distress. Breathing regular, unlabored. Abd soft. Generalized weakness and fatigue.   Plan: - DNR/DNI - No feeding tube - Working on MOST form - Transition to LTC with hospice per family request  35 min  Alicia Kitty, NP Palliative Medicine Team Pager [phone] (Please see [email or web address] for schedule) Team  Phone [phone]

## 2024-04-09 NOTE — Progress Notes (Addendum)
 "                        PROGRESS NOTE        PATIENT DETAILS Name: Alicia Brennan Age: 74 y.o. Sex: female Date of Birth: 1950-09-28 Admit Date: 03/31/2024 Admitting Physician Posey Maier, DO ERE:Qzops Tor Edra GRADE, MD  Brief Summary: Patient is a 74 y.o.  female with history of dementia, seizure disorder-who was transferred from [facility] for LTM EEG.  Significant events: 12/28-01/5>> hospitalization at [facility] for altered mental status-fever at [facility] workup negative-transferred to [facility] for LTM EEG. 01/5>> admit to TRH at [facility].  Significant studies: 12/28>> CT head: No acute intracranial abnormality 12/29>> CXR: No PNA 12/31>> RUE Doppler: No DVT 01/01>> MRI brain: No acute intracranial abnormality 01/03>> CXR: Left lingula subsegmental atelectasis.  Significant microbiology data: 12/28>> COVID/influenza/RSV PCR: Negative 12/29>> blood culture: No growth 12/29>> urine culture: Multiple species 12/29>> respiratory virus panel: Negative 01/03>> blood culture: No growth 01/03>> COVID/influenza/RSV PCR: Negative 01/05>> respiratory virus panel: Negative 01/05>> CSF meningitis panel: Negative 01/05>> CSF culture: Pending  Procedures: 1/5>> fluoroscopy-guided LP  Consults: Neurology Palliative  Subjective:  Patient in bed, appears comfortable, denies any headache, no chest pain or shortness of breath.  Overall appears quite lethargic and weak.   Objective: Vitals: Blood pressure (!) 108/49, pulse 73, temperature 98.2 F (36.8 C), temperature source Axillary, resp. rate 16, height 5' 4 (1.626 m), weight 69 kg, SpO2 97%.   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  fever Had episodes of fever at [facility] on 1/3-1/5-none since then. Has had extensive workup-see above-no source apparent Although UA suggestive of UTI-urine cultures nondiagnostic Suspicion for aspiration pneumonitis-in the setting of AMS-completed a course of Unasyn  on 1/8.   Acute metabolic encephalopathy Thought to be secondary to infectious disease in the setting of fever-but concerned that patient could be having nonconvulsive seizures-LTM EEG negative for seizures. Overall improved-has underlying dementia-maintain delirium precautions.  Continue Keppra /Vimpat /Depakote .  Seizure disorder See above  Mood disorder Zoloft   GERD PPI  HTN BP stable  Continue amlodipine . Will add low-dose Coreg  for better control  Hypokalemia Repleted.  Hypernatremia Likely secondary to poor oral intake Encourage oral intake Sodium has now stabilized-repeat electrolytes periodically-no longer on half-normal NS.  Dementia Delirium precautions  Palliative care Long discussion with daughter on 1/7-understands poor overall prognosis-we discussed how tube feeds really do not improve quality of life and longevity.  Subsequently evaluated by palliative care on 1/8-DNR-no tube feeds-SNF with palliative follow-up planned.   Pressure Ulcer: Agree with assessment as outlined below. Wound 03/24/24 1721 Pressure Injury Buttocks Medial Stage 2 -  Partial thickness loss of dermis presenting as a shallow open injury with a red, pink wound bed without slough. (Active)   Code status:   Code Status: Limited: Do not attempt resuscitation (DNR) -DNR-LIMITED -Do Not Intubate/DNI    DVT Prophylaxis: enoxaparin  (LOVENOX ) injection 40 mg Start: 04/01/24 1000 SCDs Start: 04/01/24 0023   Family  Communication: Daughter-[name] [phone] (  [phone] (work)-updated 04/07/2024, 04/09/2024   Disposition Plan: Status is: Inpatient Remains inpatient appropriate because: Severity of illness   Planned Discharge Destination:Skilled nursing facility   Diet: Diet Order             Diet regular Room service appropriate? Yes with Assist; Fluid consistency: Thin  Diet effective now                     Antimicrobial agents: Anti-infectives (From admission, onward)    Start     Dose/Rate Route Frequency Ordered Stop   04/03/24 1400  Ampicillin -Sulbactam (UNASYN ) 3 g in sodium chloride  0.9 % 100 mL IVPB        3 g 200 mL/hr over 30 Minutes Intravenous Every 6 hours 04/03/24 1340 04/03/24 2144   04/01/24 1100  Ampicillin -Sulbactam (UNASYN ) 3 g in sodium chloride  0.9 % 100 mL IVPB  Status:  Discontinued        3 g 200 mL/hr over 30 Minutes Intravenous Every 6 hours 04/01/24 1003 04/03/24 1340   04/01/24 1000  cefTRIAXone  (ROCEPHIN ) 2 g in sodium chloride  0.9 % 100 mL IVPB  Status:  Discontinued        2 g 200 mL/hr over 30 Minutes Intravenous Every 12 hours 04/01/24 0137 04/01/24 0152   04/01/24 1000  acyclovir  (ZOVIRAX ) 700 mg in dextrose  5 % 100 mL IVPB  Status:  Discontinued        700 mg 114 mL/hr over 60 Minutes Intravenous Every 12 hours 04/01/24 0238 04/01/24 1002   04/01/24 1000  cefTRIAXone  (ROCEPHIN ) 2 g in sodium chloride  0.9 % 100 mL IVPB  Status:  Discontinued        2 g 200 mL/hr over 30 Minutes Intravenous Every 12 hours 04/01/24 0238 04/01/24 1002   04/01/24 1000  vancomycin  (VANCOCIN ) IVPB 1000 mg/200 mL premix  Status:  Discontinued        1,000 mg 200 mL/hr over 60 Minutes Intravenous Every 24 hours 04/01/24 0238 04/01/24 0307   04/01/24 1000  cefTRIAXone  (ROCEPHIN ) 2 g in sodium chloride  0.9 % 100 mL IVPB  Status:  Discontinued        2 g 200 mL/hr over 30 Minutes Intravenous Every 24 hours 04/01/24 0152 04/01/24 0259   04/01/24 0600  ampicillin   (OMNIPEN) 2 g in sodium chloride  0.9 % 100 mL IVPB  Status:  Discontinued        2 g 300 mL/hr over 20 Minutes Intravenous Every 6 hours 04/01/24 0238 04/01/24 1002   04/01/24 0307  vancomycin  variable dose per unstable renal function (pharmacist dosing)  Status:  Discontinued         Does not apply See admin instructions 04/01/24 0307 04/01/24 1002        MEDICATIONS: Scheduled Meds:  amLODipine   5 mg Oral Daily   carvedilol   3.125 mg Oral BID WC   divalproex   250 mg Oral Q8H   enoxaparin  (LOVENOX ) injection  40 mg Subcutaneous Q24H   feeding supplement  237 mL Oral BID BM   lacosamide   50 mg Oral BID   levETIRAcetam   500 mg Oral BID   mouth rinse  15 mL Mouth Rinse 4 times per day   pantoprazole   40 mg Oral Daily   sertraline   25 mg Oral Daily   Continuous Infusions:   PRN Meds:.acetaminophen  **OR** acetaminophen , hydrALAZINE , labetalol , ondansetron  **OR** ondansetron  (ZOFRAN ) IV, mouth rinse   I have personally reviewed following labs and imaging studies  LABORATORY  DATA: CBC: Recent Labs  Lab 04/03/24 0347  WBC 7.3  HGB 11.4*  HCT 36.4  MCV 95.0  PLT 205    Basic Metabolic Panel: Recent Labs  Lab 04/03/24 0347 04/04/24 0256 04/05/24 0442 04/06/24 0414 04/08/24 0651  NA 146* 144 140 137  --   K 3.5 3.6 3.6 3.8  --   CL 112* 109 108 104  --   CO2 25 26 25 24   --   GLUCOSE 101* 101* 84 99  --   BUN 9 7* 7* 9  --   CREATININE 0.54 0.58 0.52 0.54 0.55  CALCIUM 9.0 8.8* 8.8* 8.6*  --   MG  --  1.8  --   --   --   PHOS  --  2.5  --   --   --     GFR: Estimated Creatinine Clearance: 57.9 mL/min (by C-G formula based on SCr of 0.55 mg/dL).  Liver Function Tests: No results for input(s): AST, ALT, ALKPHOS, BILITOT, PROT, ALBUMIN  in the last 168 hours.  No results for input(s): LIPASE, AMYLASE in the last 168 hours. No results for input(s): AMMONIA in the last 168 hours.   Coagulation Profile: No results for input(s): INR,  PROTIME in the last 168 hours.  Cardiac Enzymes: No results for input(s): CKTOTAL, CKMB, CKMBINDEX, TROPONINI in the last 168 hours.   BNP (last 3 results) Recent Labs    12/06/23 1658  PROBNP 64.3    Lipid Profile: No results for input(s): CHOL, HDL, LDLCALC, TRIG, CHOLHDL, LDLDIRECT in the last 72 hours.  Thyroid Function Tests: No results for input(s): TSH, T4TOTAL, FREET4, T3FREE, THYROIDAB in the last 72 hours.   Anemia Panel: No results for input(s): VITAMINB12, FOLATE, FERRITIN, TIBC, IRON, RETICCTPCT in the last 72 hours.   Urine analysis:    Component Value Date/Time   COLORURINE YELLOW 03/31/2024 2112   APPEARANCEUR CLOUDY (A) 03/31/2024 2112   APPEARANCEUR Clear 05/17/2013 1525   LABSPEC 1.017 03/31/2024 2112   LABSPEC 1.019 05/17/2013 1525   PHURINE 6.0 03/31/2024 2112   GLUCOSEU NEGATIVE 03/31/2024 2112   GLUCOSEU Negative 05/17/2013 1525   HGBUR SMALL (A) 03/31/2024 2112   BILIRUBINUR NEGATIVE 03/31/2024 2112   BILIRUBINUR Negative 05/17/2013 1525   KETONESUR NEGATIVE 03/31/2024 2112   PROTEINUR 30 (A) 03/31/2024 2112   NITRITE NEGATIVE 03/31/2024 2112   LEUKOCYTESUR LARGE (A) 03/31/2024 2112   LEUKOCYTESUR Trace 05/17/2013 1525    Sepsis Labs: Lactic Acid, Venous    Component Value Date/Time   LATICACIDVEN 1.0 03/24/2024 0147    MICROBIOLOGY: Recent Results (from the past 240 hours)  Respiratory (~20 pathogens) panel by PCR     Status: None   Collection Time: 03/31/24  9:00 AM   Specimen: Nasopharyngeal Swab; Respiratory  Result Value Ref Range Status   Adenovirus NOT DETECTED NOT DETECTED Final   Coronavirus 229E NOT DETECTED NOT DETECTED Final    Comment: (NOTE) The Coronavirus on the Respiratory Panel, DOES NOT test for the novel  Coronavirus (2019 nCoV)    Coronavirus HKU1 NOT DETECTED NOT DETECTED Final   Coronavirus NL63 NOT DETECTED NOT DETECTED Final   Coronavirus OC43 NOT DETECTED NOT  DETECTED Final   Metapneumovirus NOT DETECTED NOT DETECTED Final   Rhinovirus / Enterovirus NOT DETECTED NOT DETECTED Final   Influenza A NOT DETECTED NOT DETECTED Final   Influenza B NOT DETECTED NOT DETECTED Final   Parainfluenza Virus 1 NOT DETECTED NOT DETECTED Final   Parainfluenza Virus 2 NOT  DETECTED NOT DETECTED Final   Parainfluenza Virus 3 NOT DETECTED NOT DETECTED Final   Parainfluenza Virus 4 NOT DETECTED NOT DETECTED Final   Respiratory Syncytial Virus NOT DETECTED NOT DETECTED Final   Bordetella pertussis NOT DETECTED NOT DETECTED Final   Bordetella Parapertussis NOT DETECTED NOT DETECTED Final   Chlamydophila pneumoniae NOT DETECTED NOT DETECTED Final   Mycoplasma pneumoniae NOT DETECTED NOT DETECTED Final    Comment: Performed at [facility], [address], [place], [place] [address]  CSF culture w Gram Stain     Status: None   Collection Time: 03/31/24 11:07 AM   Specimen: PATH Cytology CSF; Cerebrospinal Fluid  Result Value Ref Range Status   Specimen Description   Final    CSF Performed at [facility], [address], [place], [place] [address]    Special Requests   Final    C2 Performed at [facility], [address] [place], [place] [address]    Gram Stain   Final    WBC SEEN RED BLOOD CELLS NO ORGANISMS SEEN Performed at [facility], [address], [place], [place] [address]    Culture   Final    NO GROWTH 3 DAYS Performed at [facility], [address] [place], [place] [address]    Report Status 04/03/2024 FINAL  Final  Fungus Culture With Stain     Status: None (Preliminary result)   Collection Time: 03/31/24 11:07 AM   Specimen: PATH Cytology CSF; Cerebrospinal Fluid  Result Value Ref Range Status   Fungus Stain Final report  Final    Comment: (NOTE) Performed At: [facility] [address] [place], [place] [id number] Jennette Shorter MD Ey:[id number]    Fungus (Mycology) Culture PENDING   Incomplete   Fungal Source CSF  Final    Comment: TUBE 1 Performed at [facility], [address] [place], [place] [address]   Fungus Culture Result     Status: None   Collection Time: 03/31/24 11:07 AM  Result Value Ref Range Status   Result 1 Comment  Final    Comment: (NOTE) KOH/Calcofluor preparation:  no fungus observed. Performed At: [facility] [address] [place], [place] [id number] Jennette Shorter MD Ey:[id number]     RADIOLOGY STUDIES/RESULTS: No results found.    LOS: 9 days   Lavada Stank, MD  [facility]    To contact the attending provider between 7A-7P or the covering provider during after hours 7P-7A, please log into the web site [email or web address] and access using universal [place] password for that web site. If you do not have the password, please call the hospital operator.  04/09/2024, 8:29 AM    "

## 2024-04-09 NOTE — Plan of Care (Signed)

## 2024-04-10 LAB — CBC WITH DIFFERENTIAL/PLATELET
Abs Immature Granulocytes: 0.04 K/uL (ref 0.00–0.07)
Basophils Absolute: 0 K/uL (ref 0.0–0.1)
Basophils Relative: 0 %
Eosinophils Absolute: 0.1 K/uL (ref 0.0–0.5)
Eosinophils Relative: 1 %
HCT: 32.9 % — ABNORMAL LOW (ref 36.0–46.0)
Hemoglobin: 10.6 g/dL — ABNORMAL LOW (ref 12.0–15.0)
Immature Granulocytes: 1 %
Lymphocytes Relative: 23 %
Lymphs Abs: 1.6 K/uL (ref 0.7–4.0)
MCH: 30.1 pg (ref 26.0–34.0)
MCHC: 32.2 g/dL (ref 30.0–36.0)
MCV: 93.5 fL (ref 80.0–100.0)
Monocytes Absolute: 0.8 K/uL (ref 0.1–1.0)
Monocytes Relative: 12 %
Neutro Abs: 4.4 K/uL (ref 1.7–7.7)
Neutrophils Relative %: 63 %
Platelets: 295 K/uL (ref 150–400)
RBC: 3.52 MIL/uL — ABNORMAL LOW (ref 3.87–5.11)
RDW: 14.4 % (ref 11.5–15.5)
WBC: 7 K/uL (ref 4.0–10.5)
nRBC: 0 % (ref 0.0–0.2)

## 2024-04-10 LAB — BASIC METABOLIC PANEL WITH GFR
Anion gap: 8 (ref 5–15)
BUN: 12 mg/dL (ref 8–23)
CO2: 25 mmol/L (ref 22–32)
Calcium: 8.9 mg/dL (ref 8.9–10.3)
Chloride: 103 mmol/L (ref 98–111)
Creatinine, Ser: 0.5 mg/dL (ref 0.44–1.00)
GFR, Estimated: >60 mL/min
Glucose, Bld: 89 mg/dL (ref 70–99)
Potassium: 3.8 mmol/L (ref 3.5–5.1)
Sodium: 136 mmol/L (ref 135–145)

## 2024-04-10 LAB — MAGNESIUM: Magnesium: 1.9 mg/dL (ref 1.7–2.4)

## 2024-04-10 NOTE — Progress Notes (Addendum)
 Nutrition Follow-up  DOCUMENTATION CODES:      INTERVENTION:  Continue Ensure Plus High Protein po BID, each supplement provides 350 kcal and 20 grams of protein    Continue Magic cup TID with meals, each supplement provides 290 kcal and 9 grams of protein   Collect new weight, if able  Continue liberalized diet Encourage PO intake Feeding assistance   Monitor GOC discussion and modify plan of care as indicated  No PEG  No artificial nutrition support    NUTRITION DIAGNOSIS:  Inadequate oral intake related to lethargy/confusion as evidenced by meal completion < 25%.  GOAL:  Other (Comment) (focus on nutrition intake as it relates to comfort and avoiding electrolyte derangement)  MONITOR:  PO intake, Supplement acceptance, Labs  REASON FOR ASSESSMENT:  Consult Assessment of nutrition requirement/status  ASSESSMENT:   Pt with PMH significant for: dementia, seizure disorder, sarcoidosis of lung, HTN, arthritis, IBS and cancer (2008). Originally admitted to [facility] for AMS and fever. Work up was negative and patient transferred to [facility] for LTM EEG.  12/28 - 1/05 admitted to [facility] with work up unrevealing 1/05 - transfer to [facility] for LTM EEG   GOC discussions ongoing. Per palliative, DNR and no feeding tubes. No aggressive interventions. Focus on comfort, but not full comfort measures at this time. Discharge planning ongoing. Plan for LTC placement with palliative to follow at facility.    Average Meal Intake 1/10: 0% x1 documented meal 1/13: 0% x2 documented meals   No family at bedside this afternoon. Intake remains poor despite feeding assistance being provided. Continue Ensures and [employer]. Intake remains inadequate, however appears to be accepting most offerings of ONS and medications. Mentation fluctuates. Labs stable. Has not required fluids or electrolyte repletion recently.    Admit/Current Weight: 69 kg - still needs new weight collected  No new weight collected.  Recommend collecting for awareness and not necessarily aggressive intervention.   Meds:  pantoprazole    Labs: Na+ 136 (wdl) K+ 3.8 (wdl) Mg 1.9 (wdl) CBGs 89-99 x24 hours   Diet Order:   Diet Order             Diet regular Room service appropriate? Yes with Assist; Fluid consistency: Thin  Diet effective now                   EDUCATION NEEDS:   Not appropriate for education at this time  Skin:  Skin Assessment: Skin Integrity Issues: Skin Integrity Issues:: Stage II Stage II: medial buttocks  Last BM:  1/07 - type 2 x1  Height:  Ht Readings from Last 1 Encounters:  03/31/24 5' 4 (1.626 m)   Weight:  Wt Readings from Last 1 Encounters:  03/31/24 69 kg   Ideal Body Weight:  54.5 kg  BMI:  Body mass index is 26.11 kg/m.  Estimated Nutritional Needs:   Kcal:  1400-1600 kcals  Protein:  70-85g  Fluid:  1.4-1.6L/day  Blair Deaner MS, RD, LDN Registered Dietitian I Clinical Nutrition RD Inpatient Contact Info in Amion

## 2024-04-10 NOTE — Progress Notes (Signed)
 "                        PROGRESS NOTE        PATIENT DETAILS Name: Alicia Brennan Age: 74 y.o. Sex: female Date of Birth: 03-18-1951 Admit Date: 03/31/2024 Admitting Physician Posey Maier, DO ERE:Qzops Tor Edra GRADE, MD  Brief Summary: Patient is a 74 y.o.  female with history of dementia, seizure disorder-who was transferred from [facility] for LTM EEG.  Significant events: 12/28-01/5>> hospitalization at [facility] for altered mental status-fever at [facility] workup negative-transferred to [facility] for LTM EEG. 01/5>> admit to TRH at [facility].  Significant studies: 12/28>> CT head: No acute intracranial abnormality 12/29>> CXR: No PNA 12/31>> RUE Doppler: No DVT 01/01>> MRI brain: No acute intracranial abnormality 01/03>> CXR: Left lingula subsegmental atelectasis.  Significant microbiology data: 12/28>> COVID/influenza/RSV PCR: Negative 12/29>> blood culture: No growth 12/29>> urine culture: Multiple species 12/29>> respiratory virus panel: Negative 01/03>> blood culture: No growth 01/03>> COVID/influenza/RSV PCR: Negative 01/05>> respiratory virus panel: Negative 01/05>> CSF meningitis panel: Negative 01/05>> CSF culture: Pending  Procedures: 1/5>> fluoroscopy-guided LP  Consults: Neurology Palliative  Subjective:  Patient in bed, appears comfortable, denies any headache, no fever, no chest pain or pressure, no shortness of breath , no abdominal pain. No new focal weakness.  Objective: Vitals: Blood pressure (!) 133/57, pulse 73, temperature 98 F (36.7 C), temperature source Oral, resp. rate 16, height 5' 4 (1.626 m), weight 69 kg, SpO2 97%.   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  Fever Had episodes of fever at [facility] on 1/3-1/5-none since then. Has had extensive workup-see above-no source apparent Although UA suggestive of UTI-urine cultures nondiagnostic Suspicion for aspiration pneumonitis-in the setting of AMS-completed a course of Unasyn  on 1/8.   Acute metabolic encephalopathy Thought to be secondary to infectious disease in the setting of fever-but concerned that patient could be having nonconvulsive seizures-LTM EEG negative for seizures. Overall improved-has underlying dementia-maintain delirium precautions.  Continue Keppra /Vimpat /Depakote .  Seizure disorder See above  Mood disorder Zoloft   GERD PPI  HTN BP stable  Continue amlodipine . Will add low-dose Coreg  for better control  Hypokalemia Repleted.  Hypernatremia Likely secondary to poor oral intake Encourage oral intake Sodium has now stabilized-repeat electrolytes periodically-no longer on half-normal NS.  Dementia Delirium precautions  Palliative care Long discussion with daughter on 1/7-understands poor overall prognosis-we discussed how tube feeds really do not improve quality of life and longevity.  Subsequently evaluated by palliative care on 1/8-DNR-no tube feeds-SNF with palliative follow-up planned.   Pressure Ulcer: Agree with assessment as outlined below. Wound 03/24/24 1721 Pressure Injury Buttocks Medial Stage 2 -  Partial thickness loss of dermis presenting as a shallow open injury with a red, pink wound bed without slough. (Active)   Code status:   Code Status: Limited: Do not attempt resuscitation (DNR) -DNR-LIMITED -Do Not Intubate/DNI    DVT Prophylaxis: enoxaparin  (LOVENOX ) injection 40 mg Start: 04/01/24 1000 SCDs Start: 04/01/24 0023  Family Communication: Daughter-[name] [phone] (work)-updated 04/07/2024, 04/09/2024   Disposition Plan: Status is: Inpatient Remains inpatient appropriate because: Severity of illness   Planned Discharge Destination:Skilled nursing facility   Diet: Diet Order             Diet regular Room service appropriate? Yes with Assist; Fluid consistency: Thin  Diet effective now                     Antimicrobial agents: Anti-infectives (From admission, onward)    Start     Dose/Rate Route Frequency Ordered Stop   04/03/24 1400  Ampicillin -Sulbactam (UNASYN ) 3 g in sodium chloride  0.9 % 100 mL IVPB        3 g 200 mL/hr over 30 Minutes Intravenous Every 6 hours 04/03/24 1340 04/03/24 2144   04/01/24 1100  Ampicillin -Sulbactam (UNASYN ) 3 g in sodium chloride  0.9 % 100 mL IVPB  Status:  Discontinued        3 g 200 mL/hr over 30 Minutes Intravenous Every 6 hours 04/01/24 1003 04/03/24 1340   04/01/24 1000  cefTRIAXone  (ROCEPHIN ) 2 g in sodium chloride  0.9 % 100 mL IVPB  Status:  Discontinued        2 g 200 mL/hr over 30 Minutes Intravenous Every 12 hours 04/01/24 0137 04/01/24 0152   04/01/24 1000  acyclovir  (ZOVIRAX ) 700 mg in dextrose  5 % 100 mL IVPB  Status:  Discontinued        700 mg 114 mL/hr over 60 Minutes Intravenous Every 12 hours 04/01/24 0238 04/01/24 1002   04/01/24 1000  cefTRIAXone  (ROCEPHIN ) 2 g in sodium chloride  0.9 % 100 mL IVPB  Status:  Discontinued        2 g 200 mL/hr over 30 Minutes Intravenous Every 12 hours 04/01/24 0238 04/01/24 1002   04/01/24 1000  vancomycin  (VANCOCIN ) IVPB 1000 mg/200 mL premix  Status:  Discontinued        1,000 mg 200 mL/hr over 60 Minutes Intravenous Every 24 hours 04/01/24 0238 04/01/24 0307   04/01/24 1000  cefTRIAXone  (ROCEPHIN ) 2 g in sodium chloride  0.9 % 100 mL IVPB  Status:  Discontinued        2 g 200 mL/hr over 30 Minutes Intravenous Every 24 hours 04/01/24 0152 04/01/24 0259   04/01/24 0600   ampicillin  (OMNIPEN) 2 g in sodium chloride  0.9 % 100 mL IVPB  Status:  Discontinued        2 g 300 mL/hr over 20 Minutes Intravenous Every 6 hours 04/01/24 0238 04/01/24 1002   04/01/24 0307  vancomycin  variable dose per unstable renal function (pharmacist dosing)  Status:  Discontinued         Does not apply See admin instructions 04/01/24 0307 04/01/24 1002        MEDICATIONS: Scheduled Meds:  amLODipine   5 mg Oral Daily   carvedilol   3.125 mg Oral BID WC   divalproex   250 mg Oral Q8H   enoxaparin  (LOVENOX ) injection  40 mg Subcutaneous Q24H   feeding supplement  237 mL Oral BID BM   lacosamide   50 mg Oral BID   levETIRAcetam   500 mg Oral BID   mouth rinse  15 mL Mouth Rinse 4 times per day   pantoprazole   40 mg Oral Daily   sertraline   25 mg Oral Daily   Continuous Infusions:   PRN Meds:.acetaminophen  **OR** acetaminophen , hydrALAZINE , labetalol , ondansetron  **OR** ondansetron  (ZOFRAN ) IV, mouth rinse   I have personally reviewed following labs and  imaging studies  LABORATORY DATA: CBC: Recent Labs  Lab 04/10/24 0338  WBC 7.0  NEUTROABS 4.4  HGB 10.6*  HCT 32.9*  MCV 93.5  PLT 295    Basic Metabolic Panel: Recent Labs  Lab 04/04/24 0256 04/05/24 0442 04/06/24 0414 04/08/24 0651 04/10/24 0338  NA 144 140 137  --  136  K 3.6 3.6 3.8  --  3.8  CL 109 108 104  --  103  CO2 26 25 24   --  25  GLUCOSE 101* 84 99  --  89  BUN 7* 7* 9  --  12  CREATININE 0.58 0.52 0.54 0.55 0.50  CALCIUM 8.8* 8.8* 8.6*  --  8.9  MG 1.8  --   --   --  1.9  PHOS 2.5  --   --   --   --     GFR: Estimated Creatinine Clearance: 57.9 mL/min (by C-G formula based on SCr of 0.5 mg/dL).  Liver Function Tests: No results for input(s): AST, ALT, ALKPHOS, BILITOT, PROT, ALBUMIN  in the last 168 hours.  No results for input(s): LIPASE, AMYLASE in the last 168 hours. No results for input(s): AMMONIA in the last 168 hours.   Coagulation Profile: No results for  input(s): INR, PROTIME in the last 168 hours.  Cardiac Enzymes: No results for input(s): CKTOTAL, CKMB, CKMBINDEX, TROPONINI in the last 168 hours.   BNP (last 3 results) Recent Labs    12/06/23 1658  PROBNP 64.3    Lipid Profile: No results for input(s): CHOL, HDL, LDLCALC, TRIG, CHOLHDL, LDLDIRECT in the last 72 hours.  Thyroid Function Tests: No results for input(s): TSH, T4TOTAL, FREET4, T3FREE, THYROIDAB in the last 72 hours.   Anemia Panel: No results for input(s): VITAMINB12, FOLATE, FERRITIN, TIBC, IRON, RETICCTPCT in the last 72 hours.   Urine analysis:    Component Value Date/Time   COLORURINE YELLOW 03/31/2024 2112   APPEARANCEUR CLOUDY (A) 03/31/2024 2112   APPEARANCEUR Clear 05/17/2013 1525   LABSPEC 1.017 03/31/2024 2112   LABSPEC 1.019 05/17/2013 1525   PHURINE 6.0 03/31/2024 2112   GLUCOSEU NEGATIVE 03/31/2024 2112   GLUCOSEU Negative 05/17/2013 1525   HGBUR SMALL (A) 03/31/2024 2112   BILIRUBINUR NEGATIVE 03/31/2024 2112   BILIRUBINUR Negative 05/17/2013 1525   KETONESUR NEGATIVE 03/31/2024 2112   PROTEINUR 30 (A) 03/31/2024 2112   NITRITE NEGATIVE 03/31/2024 2112   LEUKOCYTESUR LARGE (A) 03/31/2024 2112   LEUKOCYTESUR Trace 05/17/2013 1525    Sepsis Labs: Lactic Acid, Venous    Component Value Date/Time   LATICACIDVEN 1.0 03/24/2024 0147    MICROBIOLOGY: Recent Results (from the past 240 hours)  Respiratory (~20 pathogens) panel by PCR     Status: None   Collection Time: 03/31/24  9:00 AM   Specimen: Nasopharyngeal Swab; Respiratory  Result Value Ref Range Status   Adenovirus NOT DETECTED NOT DETECTED Final   Coronavirus 229E NOT DETECTED NOT DETECTED Final    Comment: (NOTE) The Coronavirus on the Respiratory Panel, DOES NOT test for the novel  Coronavirus (2019 nCoV)    Coronavirus HKU1 NOT DETECTED NOT DETECTED Final   Coronavirus NL63 NOT DETECTED NOT DETECTED Final   Coronavirus OC43  NOT DETECTED NOT DETECTED Final   Metapneumovirus NOT DETECTED NOT DETECTED Final   Rhinovirus / Enterovirus NOT DETECTED NOT DETECTED Final   Influenza A NOT DETECTED NOT DETECTED Final   Influenza B NOT DETECTED NOT DETECTED Final   Parainfluenza Virus 1 NOT DETECTED NOT DETECTED Final  Parainfluenza Virus 2 NOT DETECTED NOT DETECTED Final   Parainfluenza Virus 3 NOT DETECTED NOT DETECTED Final   Parainfluenza Virus 4 NOT DETECTED NOT DETECTED Final   Respiratory Syncytial Virus NOT DETECTED NOT DETECTED Final   Bordetella pertussis NOT DETECTED NOT DETECTED Final   Bordetella Parapertussis NOT DETECTED NOT DETECTED Final   Chlamydophila pneumoniae NOT DETECTED NOT DETECTED Final   Mycoplasma pneumoniae NOT DETECTED NOT DETECTED Final    Comment: Performed at [facility], [address], [place], [place] [address]  CSF culture w Gram Stain     Status: None   Collection Time: 03/31/24 11:07 AM   Specimen: PATH Cytology CSF; Cerebrospinal Fluid  Result Value Ref Range Status   Specimen Description   Final    CSF Performed at [facility], [address] [place], [place] [address]    Special Requests   Final    C2 Performed at [facility], [address] [place], [place] [address]    Gram Stain   Final    WBC SEEN RED BLOOD CELLS NO ORGANISMS SEEN Performed at [facility], [address] [place], [place] [address]    Culture   Final    NO GROWTH 3 DAYS Performed at [facility], [address], [place], [place] [address]    Report Status 04/03/2024 FINAL  Final  Fungus Culture With Stain     Status: None (Preliminary result)   Collection Time: 03/31/24 11:07 AM   Specimen: PATH Cytology CSF; Cerebrospinal Fluid  Result Value Ref Range Status   Fungus Stain Final report  Final    Comment: (NOTE) Performed At: [facility] [address] [place], [place] [id number] Jennette Shorter MD Ey:[id number]    Fungus  (Mycology) Culture PENDING  Incomplete   Fungal Source CSF  Final    Comment: TUBE 1 Performed at [facility], [address] [place], [place] [address]   Fungus Culture Result     Status: None   Collection Time: 03/31/24 11:07 AM  Result Value Ref Range Status   Result 1 Comment  Final    Comment: (NOTE) KOH/Calcofluor preparation:  no fungus observed. Performed At: [facility] [address] [place], [place] [id number] Jennette Shorter MD Ey:[id number]     RADIOLOGY STUDIES/RESULTS: No results found.    LOS: 10 days   Lavada Stank, MD  [facility]    To contact the attending provider between 7A-7P or the covering provider during after hours 7P-7A, please log into the web site [email or web address] and access using universal [place] password for that web site. If you do not have the password, please call the hospital operator.  04/10/2024, 8:25 AM    "

## 2024-04-10 NOTE — Progress Notes (Signed)
 Physical Therapy Treatment Patient Details Name: Alicia Brennan MRN: [id number] DOB: June 19, 1950 Today's Date: 04/10/2024   History of Present Illness 74 y.o. female admitted from SNF to [facility] on 03/24/24 with AMS, fever, concerns for aspiration. Transfer to [facility] for LTM EEG, negative for seizures. Head CT/MRI negative for acute injury. RUE swelling; imaging negative for DVT. CXR 1/3 with atelectasis. Concern for aspiration pneumonitis. Fluoroscopy-guided LP 1/5. 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    PT Comments  Pt sleeping upon arrival and declines sitting EOB during session. Pt agreeable to bed level exercises. Pt with some initial difficulty tolerating PROM to B LE, quickly grimacing and gasping with initial movement but improves with repetition. No muscle activation noted with movement on B LE. AAROM UE exercises completed with pt following cues inconsistently to push/pull against slight resistance. Pt better tolerates UE vs LE ROM. Pt placed in chair position ~5 minutes at end of session to promote upright tolerance. Pt falls back asleep in this position and is returned to standard bed position. Goals updated due to limited progression. Family considering comfort care, will continue to follow while appropriate.  Pt would benefit from continued PT services focused on bed mobility, seated balance, and transfers as appropriate to progress functional mobility.    If plan is discharge home, recommend the following: Two people to help with walking and/or transfers;Two people to help with bathing/dressing/bathroom;Assistance with cooking/housework;Assistance with feeding;Direct supervision/assist for medications management;Direct supervision/assist for financial management;Assist for transportation;Help with stairs or ramp for entrance;Supervision due to cognitive status   Can travel by private vehicle     No  Equipment  Recommendations  Wheelchair (measurements PT);Wheelchair cushion (measurements PT);Hospital bed;Hoyer lift    Recommendations for Other Services       Precautions / Restrictions Precautions Precautions: Fall;Other (comment) Recall of Precautions/Restrictions: Impaired Precaution/Restrictions Comments: incontinence; chronic R-side weakness/stiff & foot drop Restrictions Weight Bearing Restrictions Per Provider Order: No     Mobility  Bed Mobility               General bed mobility comments: Pt placed in bed position and exercises were completed, pt slightly shakes head no when encouraged to sit EOB.    Transfers                        Ambulation/Gait                   Stairs             Wheelchair Mobility     Tilt Bed    Modified Rankin (Stroke Patients Only)       Balance       Sitting balance - Comments: Not observed at this time, pt declines sitting EOB. In chair position with posterior support of bed throughout.                                    Communication Communication Communication: Impaired Factors Affecting Communication: Difficulty expressing self;Reduced clarity of speech  Cognition Arousal: Lethargic Behavior During Therapy: Flat affect   PT - Cognitive impairments: History of cognitive impairments, Sequencing                       PT - Cognition Comments: h/o dementia, minimally expressive, does not respond verbally to questions Following commands:  Impaired Following commands impaired: Follows one step commands with increased time, Follows one step commands inconsistently    Cueing Cueing Techniques: Verbal cues, Gestural cues, Tactile cues, Visual cues  Exercises General Exercises - Upper Extremity Shoulder Flexion: AAROM, Both, 5 reps Elbow Flexion: AAROM, 5 reps, Both (slight resistance applied occasionally) Elbow Extension: AAROM, 5 reps, Both (light resistance applied  occasionally) General Exercises - Lower Extremity Ankle Circles/Pumps: PROM, 5 reps, Both (painful with initial rep, improves with reps) Short Arc Quad: PROM, Both, 5 reps Heel Slides: PROM, Both, 5 reps (within limited range, increased resistance at the hip on R side) Hip ABduction/ADduction: PROM, Both, 5 reps Other Exercises Other Exercises: Shoulder ER/IR 1x5    General Comments General comments (skin integrity, edema, etc.): VSS througout. No new skin abnormalities noted.      Pertinent Vitals/Pain Pain Assessment Pain Assessment: Faces Faces Pain Scale: Hurts little more Pain Location: B LE Pain Descriptors / Indicators: Grimacing, Guarding (Pt quickly gasps when attempting to mobilize B LE, improves after initial rep) Pain Intervention(s): Limited activity within patient's tolerance, Repositioned, Monitored during session    Home Living                          Prior Function            PT Goals (current goals can now be found in the care plan section) Acute Rehab PT Goals Patient Stated Goal: family interested in comfort care per daughter PT Goal Formulation: With family Progress towards PT goals: Not progressing toward goals - comment (Minimal response to cues, increased level of assist required for mobility.)    Frequency    Min 2X/week      PT Plan      Co-evaluation              AM-PAC PT 6 Clicks Mobility   Outcome Measure  Help needed turning from your back to your side while in a flat bed without using bedrails?: Total Help needed moving from lying on your back to sitting on the side of a flat bed without using bedrails?: Total Help needed moving to and from a bed to a chair (including a wheelchair)?: Total Help needed standing up from a chair using your arms (e.g., wheelchair or bedside chair)?: Total Help needed to walk in hospital room?: Total Help needed climbing 3-5 steps with a railing? : Total 6 Click Score: 6    End of  Session   Activity Tolerance: Patient limited by pain;Patient limited by fatigue Patient left: in bed;with call bell/phone within reach;with bed alarm set Nurse Communication: Mobility status;Need for lift equipment PT Visit Diagnosis: Unsteadiness on feet (R26.81);Muscle weakness (generalized) (M62.81)     Time: 8757-8741 PT Time Calculation (min) (ACUTE ONLY): 16 min  Charges:    $Therapeutic Exercise: 8-22 mins PT General Charges $$ ACUTE PT VISIT: 1 Visit                     Sabra Morel, PT, DPT  Acute Rehabilitation Services         Office: [phone]      Sabra MARLA Morel 04/10/2024, 4:07 PM

## 2024-04-10 NOTE — TOC Progression Note (Addendum)
 Transition of Care [facility]) - Progression Note    Patient Details  Name: Alicia Brennan MRN: [id number] Date of Birth: 12/15/1950  Transition of [facility]) CM/SW Contact  [name], LCSW Phone Number: 04/10/2024, 9:44 AM  Clinical Narrative:    9:44 AM-Left voicemail for [facility] inquiring on bed availability.  [facility] does not have beds available.  [facility] does not have beds available.   Requested [employer] review.   Updated daughter.    Expected Discharge Plan: Skilled Nursing Facility Barriers to Discharge: Insurance Authorization, SNF Pending bed offer, Continued Medical Work up               Expected Discharge Plan and Services In-house Referral: Clinical Social Work   Post Acute Care Choice: Skilled Nursing Facility Living arrangements for the past 2 months: Skilled Nursing Facility                                       Social Drivers of Health (SDOH) Interventions SDOH Screenings   Food Insecurity: Patient Unable To Answer (03/31/2024)  Recent Concern: Food Insecurity - Food Insecurity Present (01/31/2024)  Housing: Patient Unable To Answer (03/31/2024)  Transportation Needs: No Transportation Needs (01/31/2024)  Utilities: Not At Risk (01/31/2024)  Financial Resource Strain: Low Risk (11/16/2023)   Received from [facility]  Social Connections: Unknown (03/31/2024)  Recent Concern: Social Connections - Moderately Isolated (01/31/2024)  Tobacco Use: Low Risk (04/01/2024)    Readmission Risk Interventions     No data to display

## 2024-04-11 NOTE — TOC Progression Note (Signed)
 Transition of Care [facility]) - Progression Note    Patient Details  Name: Alicia Brennan MRN: [id number] Date of Birth: [birth year]  Transition of Care [facility]) CM/SW Contact  [name], LCSW Phone Number: 04/11/2024, 12:08 PM  Clinical Narrative:    Myra Master has not been able to review referral yet.    Expected Discharge Plan: Skilled Nursing Facility Barriers to Discharge: Insurance Authorization, SNF Pending bed offer, Continued Medical Work up               Expected Discharge Plan and Services In-house Referral: Clinical Social Work   Post Acute Care Choice: Skilled Nursing Facility Living arrangements for the past 2 months: Skilled Nursing Facility                                       Social Drivers of Health (SDOH) Interventions SDOH Screenings   Food Insecurity: Patient Unable To Answer (03/31/2024)  Recent Concern: Food Insecurity - Food Insecurity Present (01/31/2024)  Housing: Patient Unable To Answer (03/31/2024)  Transportation Needs: No Transportation Needs (01/31/2024)  Utilities: Not At Risk (01/31/2024)  Financial Resource Strain: Low Risk (11/16/2023)   [facility]  Social Connections: Unknown (03/31/2024)  Recent Concern: Social Connections - Moderately Isolated (01/31/2024)  Tobacco Use: Low Risk (04/01/2024)    Readmission Risk Interventions     No data to display

## 2024-04-11 NOTE — Progress Notes (Signed)
 "                        PROGRESS NOTE        PATIENT DETAILS Name: Alicia Brennan Age: 74 y.o. Sex: female Date of Birth: [birth year] Admit Date: 03/31/2024 Admitting Physician Posey Maier, DO ERE:Qzops Tor Edra GRADE, MD  Brief Summary: Patient is a 74 y.o.  female with history of dementia, seizure disorder-who was transferred from [facility] for LTM EEG.  Significant events: 12/28-01/5>> hospitalization at [facility] for altered mental status-fever at [facility] workup negative-transferred to [facility] for LTM EEG. 01/5>> admit to TRH at [facility].  Significant studies: 12/28>> CT head: No acute intracranial abnormality 12/29>> CXR: No PNA 12/31>> RUE Doppler: No DVT 01/01>> MRI brain: No acute intracranial abnormality 01/03>> CXR: Left lingula subsegmental atelectasis.  Significant microbiology data: 12/28>> COVID/influenza/RSV PCR: Negative 12/29>> blood culture: No growth 12/29>> urine culture: Multiple species 12/29>> respiratory virus panel: Negative 01/03>> blood culture: No growth 01/03>> COVID/influenza/RSV PCR: Negative 01/05>> respiratory virus panel: Negative 01/05>> CSF meningitis panel: Negative 01/05>> CSF culture: Pending  Procedures: 1/5>> fluoroscopy-guided LP  Consults: Neurology Palliative  Subjective: In bed appears to be in no distress denies any headache chest or abdominal pain.  Objective: Vitals: Blood pressure 124/64, pulse 77, temperature 98.4 F (36.9 C), temperature source Axillary, resp. rate 17, height 5' 4 (1.626 m), weight 69 kg, SpO2 97%.   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  Fever Had episodes of fever at [facility] on 1/3-1/5-none since then. Has had extensive workup-see above-no source apparent Although UA suggestive of UTI-urine cultures nondiagnostic Suspicion for aspiration pneumonitis-in the setting of AMS-completed a course of Unasyn  on 1/8.   Acute metabolic encephalopathy Thought to be secondary to infectious disease in the setting of fever-but concerned that patient could be having nonconvulsive seizures-LTM EEG negative for seizures. Overall improved-has underlying dementia-maintain delirium precautions.  Continue Keppra /Vimpat /Depakote .  Seizure disorder See above  Mood disorder Zoloft   GERD PPI  HTN BP stable  Continue amlodipine . Will add low-dose Coreg  for better control  Hypokalemia Repleted.  Hypernatremia Likely secondary to poor oral intake Encourage oral intake Sodium has now stabilized-repeat electrolytes periodically-no longer on half-normal NS.  Dementia Delirium precautions  Palliative care Long discussion with daughter on 1/7-understands poor overall prognosis-we discussed how tube feeds really do not improve quality of life and longevity.  Subsequently evaluated by palliative care on 1/8-DNR-no tube feeds-SNF with palliative follow-up planned.   Pressure Ulcer: Agree with assessment as outlined below. Wound 03/24/24 1721 Pressure Injury Buttocks Medial Stage 2 -  Partial thickness loss of dermis presenting as a shallow open injury with a red, pink wound bed without slough. (Active)   Code status:   Code Status: Limited: Do not attempt resuscitation (DNR) -DNR-LIMITED -Do Not Intubate/DNI    DVT Prophylaxis: enoxaparin  (LOVENOX ) injection 40 mg Start: 04/01/24 1000 SCDs Start: 04/01/24 0023   Family Communication: Daughter-[name] [phone] ([phone]  (work)-updated 04/07/2024, 04/09/2024   Disposition Plan:  Status is: Inpatient Remains inpatient appropriate because: Severity of illness   Planned Discharge Destination:Skilled nursing facility   Diet: Diet Order             Diet regular Room service appropriate? Yes with Assist; Fluid consistency: Thin  Diet effective now                     Antimicrobial agents: Anti-infectives (From admission, onward)    Start     Dose/Rate Route Frequency Ordered Stop   04/03/24 1400  Ampicillin -Sulbactam (UNASYN ) 3 g in sodium chloride  0.9 % 100 mL IVPB        3 g 200 mL/hr over 30 Minutes Intravenous Every 6 hours 04/03/24 1340 04/03/24 2144   04/01/24 1100  Ampicillin -Sulbactam (UNASYN ) 3 g in sodium chloride  0.9 % 100 mL IVPB  Status:  Discontinued        3 g 200 mL/hr over 30 Minutes Intravenous Every 6 hours 04/01/24 1003 04/03/24 1340   04/01/24 1000  cefTRIAXone  (ROCEPHIN ) 2 g in sodium chloride  0.9 % 100 mL IVPB  Status:  Discontinued        2 g 200 mL/hr over 30 Minutes Intravenous Every 12 hours 04/01/24 0137 04/01/24 0152   04/01/24 1000  acyclovir  (ZOVIRAX ) 700 mg in dextrose  5 % 100 mL IVPB  Status:  Discontinued        700 mg 114 mL/hr over 60 Minutes Intravenous Every 12 hours 04/01/24 0238 04/01/24 1002   04/01/24 1000  cefTRIAXone  (ROCEPHIN ) 2 g in sodium chloride  0.9 % 100 mL IVPB  Status:  Discontinued        2 g 200 mL/hr over 30 Minutes Intravenous Every 12 hours 04/01/24 0238 04/01/24 1002   04/01/24 1000  vancomycin  (VANCOCIN ) IVPB 1000 mg/200 mL premix  Status:  Discontinued        1,000 mg 200 mL/hr over 60 Minutes Intravenous Every 24 hours 04/01/24 0238 04/01/24 0307   04/01/24 1000  cefTRIAXone  (ROCEPHIN ) 2 g in sodium chloride  0.9 % 100 mL IVPB  Status:  Discontinued        2 g 200 mL/hr over 30 Minutes Intravenous Every 24 hours 04/01/24 0152 04/01/24 0259   04/01/24 0600  ampicillin  (OMNIPEN) 2 g in sodium chloride  0.9 % 100 mL IVPB  Status:   Discontinued        2 g 300 mL/hr over 20 Minutes Intravenous Every 6 hours 04/01/24 0238 04/01/24 1002   04/01/24 0307  vancomycin  variable dose per unstable renal function (pharmacist dosing)  Status:  Discontinued         Does not apply See admin instructions 04/01/24 0307 04/01/24 1002        MEDICATIONS: Scheduled Meds:  amLODipine   5 mg Oral Daily   carvedilol   3.125 mg Oral BID WC   divalproex   250 mg Oral Q8H   enoxaparin  (LOVENOX ) injection  40 mg Subcutaneous Q24H   feeding supplement  237 mL Oral BID BM   lacosamide   50 mg Oral BID   levETIRAcetam   500 mg Oral BID   mouth rinse  15 mL Mouth Rinse 4 times per day   pantoprazole   40 mg Oral Daily   sertraline   25 mg Oral Daily   Continuous Infusions:   PRN Meds:.acetaminophen  **OR** acetaminophen , hydrALAZINE , labetalol , ondansetron  **OR** ondansetron  (ZOFRAN ) IV, mouth rinse   I have personally reviewed following labs and imaging studies  LABORATORY DATA: CBC: Recent Labs  Lab 04/10/24 0338  WBC  7.0  NEUTROABS 4.4  HGB 10.6*  HCT 32.9*  MCV 93.5  PLT 295    Basic Metabolic Panel: Recent Labs  Lab 04/05/24 0442 04/06/24 0414 04/08/24 0651 04/10/24 0338  NA 140 137  --  136  K 3.6 3.8  --  3.8  CL 108 104  --  103  CO2 25 24  --  25  GLUCOSE 84 99  --  89  BUN 7* 9  --  12  CREATININE 0.52 0.54 0.55 0.50  CALCIUM 8.8* 8.6*  --  8.9  MG  --   --   --  1.9    GFR: Estimated Creatinine Clearance: 57.9 mL/min (by C-G formula based on SCr of 0.5 mg/dL).  Liver Function Tests: No results for input(s): AST, ALT, ALKPHOS, BILITOT, PROT, ALBUMIN  in the last 168 hours.  No results for input(s): LIPASE, AMYLASE in the last 168 hours. No results for input(s): AMMONIA in the last 168 hours.   Coagulation Profile: No results for input(s): INR, PROTIME in the last 168 hours.  Cardiac Enzymes: No results for input(s): CKTOTAL, CKMB, CKMBINDEX, TROPONINI in the last 168  hours.   BNP (last 3 results) Recent Labs    12/06/23 1658  PROBNP 64.3    Lipid Profile: No results for input(s): CHOL, HDL, LDLCALC, TRIG, CHOLHDL, LDLDIRECT in the last 72 hours.  Thyroid Function Tests: No results for input(s): TSH, T4TOTAL, FREET4, T3FREE, THYROIDAB in the last 72 hours.   Anemia Panel: No results for input(s): VITAMINB12, FOLATE, FERRITIN, TIBC, IRON, RETICCTPCT in the last 72 hours.   Urine analysis:    Component Value Date/Time   COLORURINE YELLOW 03/31/2024 2112   APPEARANCEUR CLOUDY (A) 03/31/2024 2112   APPEARANCEUR Clear 05/17/2013 1525   LABSPEC 1.017 03/31/2024 2112   LABSPEC 1.019 05/17/2013 1525   PHURINE 6.0 03/31/2024 2112   GLUCOSEU NEGATIVE 03/31/2024 2112   GLUCOSEU Negative 05/17/2013 1525   HGBUR SMALL (A) 03/31/2024 2112   BILIRUBINUR NEGATIVE 03/31/2024 2112   BILIRUBINUR Negative 05/17/2013 1525   KETONESUR NEGATIVE 03/31/2024 2112   PROTEINUR 30 (A) 03/31/2024 2112   NITRITE NEGATIVE 03/31/2024 2112   LEUKOCYTESUR LARGE (A) 03/31/2024 2112   LEUKOCYTESUR Trace 05/17/2013 1525    Sepsis Labs: Lactic Acid, Venous    Component Value Date/Time   LATICACIDVEN 1.0 03/24/2024 0147    MICROBIOLOGY: No results found for this or any previous visit (from the past 240 hours).   RADIOLOGY STUDIES/RESULTS: No results found.    LOS: 11 days   Lavada Stank, MD  [facility]    To contact the attending provider between 7A-7P or the covering provider during after hours 7P-7A, please log into the web site [email or web address] and access using universal [place] password for that web site. If you do not have the password, please call the hospital operator.  04/11/2024, 9:05 AM    "

## 2024-04-11 NOTE — Plan of Care (Signed)

## 2024-04-12 MED ORDER — AMLODIPINE BESYLATE 10 MG PO TABS
10.0000 mg | ORAL_TABLET | Freq: Every day | ORAL | Status: DC
  Administered 2024-04-12 – 2024-04-14 (×3): 10 mg via ORAL
  Filled 2024-04-12: qty 1
  Filled 2024-04-12: qty 2
  Filled 2024-04-12: qty 1

## 2024-04-12 NOTE — Plan of Care (Signed)

## 2024-04-12 NOTE — Progress Notes (Signed)
 "                        PROGRESS NOTE        PATIENT DETAILS Name: Alicia Brennan Age: 74 y.o. Sex: female Date of Birth: [birth year] Admit Date: 03/31/2024 Admitting Physician Posey Maier, DO ERE:Qzops Tor Edra GRADE, MD  Brief Summary: Patient is a 74 y.o.  female with history of dementia, seizure disorder-who was transferred from [facility] for LTM EEG.  Significant events: 12/28-01/5>> hospitalization at [facility] for altered mental status-fever at [facility] workup negative-transferred to [facility] for LTM EEG. 01/5>> admit to TRH at [facility].  Significant studies: 12/28>> CT head: No acute intracranial abnormality 12/29>> CXR: No PNA 12/31>> RUE Doppler: No DVT 01/01>> MRI brain: No acute intracranial abnormality 01/03>> CXR: Left lingula subsegmental atelectasis.  Significant microbiology data: 12/28>> COVID/influenza/RSV PCR: Negative 12/29>> blood culture: No growth 12/29>> urine culture: Multiple species 12/29>> respiratory virus panel: Negative 01/03>> blood culture: No growth 01/03>> COVID/influenza/RSV PCR: Negative 01/05>> respiratory virus panel: Negative 01/05>> CSF meningitis panel: Negative 01/05>> CSF culture: Pending  Procedures: 1/5>> fluoroscopy-guided LP  Consults: Neurology Palliative  Subjective: Patient in bed in no distress, no headache or chest pain.  No shortness of breath.  No abdominal discomfort.  Objective: Vitals: Blood pressure 127/67, pulse 74, temperature 98.4 F (36.9 C), temperature source Axillary, resp. rate 19, height 5' 4 (1.626 m), weight 69 kg, SpO2 97%.   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  Fever Had episodes of fever at [facility] on 1/3-1/5-none since then. Has had extensive workup-see above-no source apparent Although UA suggestive of UTI-urine cultures nondiagnostic Suspicion for aspiration pneumonitis-in the setting of AMS-completed a course of Unasyn  on 1/8.   Acute metabolic encephalopathy Thought to be secondary to infectious disease in the setting of fever-but concerned that patient could be having nonconvulsive seizures-LTM EEG negative for seizures. Overall improved-has underlying dementia-maintain delirium precautions.  Continue Keppra /Vimpat /Depakote .  Seizure disorder See above  Mood disorder Zoloft   GERD PPI  HTN BP stable  Continue amlodipine . Will add low-dose Coreg  for better control  Hypokalemia Repleted.  Hypernatremia Likely secondary to poor oral intake Encourage oral intake Sodium has now stabilized-repeat electrolytes periodically-no longer on half-normal NS.  Dementia Delirium precautions  Palliative care Long discussion with daughter on 1/7-understands poor overall prognosis-we discussed how tube feeds really do not improve quality of life and longevity.  Subsequently evaluated by palliative care on 1/8-DNR-no tube feeds-SNF with palliative follow-up planned.   Pressure Ulcer: Agree with assessment as outlined below. Wound 03/24/24 1721 Pressure Injury Buttocks Medial Stage 2 -  Partial thickness loss of dermis presenting as a shallow open injury with a red, pink wound bed without slough. (Active)   Code status:   Code Status: Limited: Do not attempt resuscitation (DNR) -DNR-LIMITED -Do Not Intubate/DNI    DVT Prophylaxis: enoxaparin  (LOVENOX ) injection 40 mg Start: 04/01/24 1000 SCDs Start: 04/01/24 0023   Family Communication: Daughter-[name] [phone]  (cell)-[phone] (work)-updated 04/07/2024,  04/09/2024   Disposition Plan: Status is: Inpatient Remains inpatient appropriate because: Severity of illness   Planned Discharge Destination:Skilled nursing facility   Diet: Diet Order             Diet regular Room service appropriate? Yes with Assist; Fluid consistency: Thin  Diet effective now                     Antimicrobial agents: Anti-infectives (From admission, onward)    Start     Dose/Rate Route Frequency Ordered Stop   04/03/24 1400  Ampicillin -Sulbactam (UNASYN ) 3 g in sodium chloride  0.9 % 100 mL IVPB        3 g 200 mL/hr over 30 Minutes Intravenous Every 6 hours 04/03/24 1340 04/03/24 2144   04/01/24 1100  Ampicillin -Sulbactam (UNASYN ) 3 g in sodium chloride  0.9 % 100 mL IVPB  Status:  Discontinued        3 g 200 mL/hr over 30 Minutes Intravenous Every 6 hours 04/01/24 1003 04/03/24 1340   04/01/24 1000  cefTRIAXone  (ROCEPHIN ) 2 g in sodium chloride  0.9 % 100 mL IVPB  Status:  Discontinued        2 g 200 mL/hr over 30 Minutes Intravenous Every 12 hours 04/01/24 0137 04/01/24 0152   04/01/24 1000  acyclovir  (ZOVIRAX ) 700 mg in dextrose  5 % 100 mL IVPB  Status:  Discontinued        700 mg 114 mL/hr over 60 Minutes Intravenous Every 12 hours 04/01/24 0238 04/01/24 1002   04/01/24 1000  cefTRIAXone  (ROCEPHIN ) 2 g in sodium chloride  0.9 % 100 mL IVPB  Status:  Discontinued        2 g 200 mL/hr over 30 Minutes Intravenous Every 12 hours 04/01/24 0238 04/01/24 1002   04/01/24 1000  vancomycin  (VANCOCIN ) IVPB 1000 mg/200 mL premix  Status:  Discontinued        1,000 mg 200 mL/hr over 60 Minutes Intravenous Every 24 hours 04/01/24 0238 04/01/24 0307   04/01/24 1000  cefTRIAXone  (ROCEPHIN ) 2 g in sodium chloride  0.9 % 100 mL IVPB  Status:  Discontinued        2 g 200 mL/hr over 30 Minutes Intravenous Every 24 hours 04/01/24 0152 04/01/24 0259   04/01/24 0600  ampicillin  (OMNIPEN) 2 g in sodium chloride  0.9 % 100 mL  IVPB  Status:  Discontinued        2 g 300 mL/hr over 20 Minutes Intravenous Every 6 hours 04/01/24 0238 04/01/24 1002   04/01/24 0307  vancomycin  variable dose per unstable renal function (pharmacist dosing)  Status:  Discontinued         Does not apply See admin instructions 04/01/24 0307 04/01/24 1002        MEDICATIONS: Scheduled Meds:  amLODipine   10 mg Oral Daily   carvedilol   3.125 mg Oral BID WC   divalproex   250 mg Oral Q8H   enoxaparin  (LOVENOX ) injection  40 mg Subcutaneous Q24H   feeding supplement  237 mL Oral BID BM   lacosamide   50 mg Oral BID   levETIRAcetam   500 mg Oral BID   mouth rinse  15 mL Mouth Rinse 4 times per day   pantoprazole   40 mg Oral Daily   sertraline   25 mg Oral Daily   Continuous Infusions:   PRN Meds:.acetaminophen  **OR** acetaminophen , hydrALAZINE , labetalol , ondansetron  **OR** ondansetron  (ZOFRAN ) IV, mouth rinse   I have personally reviewed following labs and imaging studies  LABORATORY DATA: CBC: Recent Labs  Lab 04/10/24 0338  WBC 7.0  NEUTROABS 4.4  HGB 10.6*  HCT 32.9*  MCV 93.5  PLT 295    Basic Metabolic Panel: Recent Labs  Lab 04/06/24 0414 04/08/24 0651 04/10/24 0338  NA 137  --  136  K 3.8  --  3.8  CL 104  --  103  CO2 24  --  25  GLUCOSE 99  --  89  BUN 9  --  12  CREATININE 0.54 0.55 0.50  CALCIUM 8.6*  --  8.9  MG  --   --  1.9    GFR: Estimated Creatinine Clearance: 57.9 mL/min (by C-G formula based on SCr of 0.5 mg/dL).  Liver Function Tests: No results for input(s): AST, ALT, ALKPHOS, BILITOT, PROT, ALBUMIN  in the last 168 hours.  No results for input(s): LIPASE, AMYLASE in the last 168 hours. No results for input(s): AMMONIA in the last 168 hours.   Coagulation Profile: No results for input(s): INR, PROTIME in the last 168 hours.  Cardiac Enzymes: No results for input(s): CKTOTAL, CKMB, CKMBINDEX, TROPONINI in the last 168 hours.   BNP (last 3  results) Recent Labs    12/06/23 1658  PROBNP 64.3    Lipid Profile: No results for input(s): CHOL, HDL, LDLCALC, TRIG, CHOLHDL, LDLDIRECT in the last 72 hours.  Thyroid Function Tests: No results for input(s): TSH, T4TOTAL, FREET4, T3FREE, THYROIDAB in the last 72 hours.   Anemia Panel: No results for input(s): VITAMINB12, FOLATE, FERRITIN, TIBC, IRON, RETICCTPCT in the last 72 hours.   Urine analysis:    Component Value Date/Time   COLORURINE YELLOW 03/31/2024 2112   APPEARANCEUR CLOUDY (A) 03/31/2024 2112   APPEARANCEUR Clear 05/17/2013 1525   LABSPEC 1.017 03/31/2024 2112   LABSPEC 1.019 05/17/2013 1525   PHURINE 6.0 03/31/2024 2112   GLUCOSEU NEGATIVE 03/31/2024 2112   GLUCOSEU Negative 05/17/2013 1525   HGBUR SMALL (A) 03/31/2024 2112   BILIRUBINUR NEGATIVE 03/31/2024 2112   BILIRUBINUR Negative 05/17/2013 1525   KETONESUR NEGATIVE 03/31/2024 2112   PROTEINUR 30 (A) 03/31/2024 2112   NITRITE NEGATIVE 03/31/2024 2112   LEUKOCYTESUR LARGE (A) 03/31/2024 2112   LEUKOCYTESUR Trace 05/17/2013 1525    Sepsis Labs: Lactic Acid, Venous    Component Value Date/Time   LATICACIDVEN 1.0 03/24/2024 0147    MICROBIOLOGY: No results found for this or any previous visit (from the past 240 hours).   RADIOLOGY STUDIES/RESULTS: No results found.    LOS: 12 days   Lavada Stank, MD  [facility]    To contact the attending provider between 7A-7P or the covering provider during after hours 7P-7A, please log into the web site [email or web address] and access using universal [place] password for that web site. If you do not have the password, please call the hospital operator.  04/12/2024, 9:03 AM    "

## 2024-04-12 NOTE — TOC Progression Note (Addendum)
 Transition of Care [facility]) - Progression Note    Patient Details  Name: Alicia Brennan MRN: [id number] Date of Birth: 01-24-1951  Transition of [facility] CM/SW Contact  Bridget Cordella Simmonds, LCSW Phone Number: 04/12/2024, 11:41 AM  Clinical Narrative:   Message from Nikki/[facility]: they cannot offer bed.   Message left with daughter updating her, asked for call back regarding SNF choice.   1315: daughter [name] called back, bed offers discussed, she will review.   Expected Discharge Plan: Skilled Nursing Facility Barriers to Discharge: Insurance Authorization, SNF Pending bed offer, Continued Medical Work up               Expected Discharge Plan and Services In-house Referral: Clinical Social Work   Post Acute Care Choice: Skilled Nursing Facility Living arrangements for the past 2 months: Skilled Nursing Facility                                       Social Drivers of Health (SDOH) Interventions SDOH Screenings   Food Insecurity: Patient Unable To Answer (03/31/2024)  Recent Concern: Food Insecurity - Food Insecurity Present (01/31/2024)  Housing: Patient Unable To Answer (03/31/2024)  Transportation Needs: No Transportation Needs (01/31/2024)  Utilities: Not At Risk (01/31/2024)  Financial Resource Strain: Low Risk (11/16/2023)   Received from [facility]  Social Connections: Unknown (03/31/2024)  Recent Concern: Social Connections - Moderately Isolated (01/31/2024)  Tobacco Use: Low Risk (04/01/2024)    Readmission Risk Interventions     No data to display

## 2024-04-12 NOTE — Plan of Care (Signed)
  Problem: Clinical Measurements: Goal: Will remain free from infection Outcome: Progressing   Problem: Nutrition: Goal: Adequate nutrition will be maintained Outcome: Progressing   Problem: Pain Managment: Goal: General experience of comfort will improve and/or be controlled Outcome: Progressing   Problem: Skin Integrity: Goal: Risk for impaired skin integrity will decrease Outcome: Progressing

## 2024-04-13 NOTE — Plan of Care (Signed)
" °  Problem: Clinical Measurements: Goal: Diagnostic test results will improve Outcome: Progressing Goal: Cardiovascular complication will be avoided Outcome: Progressing   Problem: Nutrition: Goal: Adequate nutrition will be maintained Outcome: Progressing   Problem: Safety: Goal: Ability to remain free from injury will improve Outcome: Progressing   Problem: Skin Integrity: Goal: Risk for impaired skin integrity will decrease Outcome: Progressing   "

## 2024-04-13 NOTE — Plan of Care (Signed)

## 2024-04-13 NOTE — Progress Notes (Signed)
 "                        PROGRESS NOTE        PATIENT DETAILS Name: Alicia Brennan Age: 73 y.o. Sex: female Date of Birth: 1950-09-26 Admit Date: 03/31/2024 Admitting Physician Posey Maier, DO ERE:Qzops Tor Edra GRADE, MD  Brief Summary: Patient is a 74 y.o.  female with history of dementia, seizure disorder-who was transferred from [facility] for LTM EEG.  Significant events: 12/28-01/5>> hospitalization at [facility] for altered mental status-fever at [facility] workup negative-transferred to [facility] for LTM EEG. 01/5>> admit to TRH at [facility].  Significant studies: 12/28>> CT head: No acute intracranial abnormality 12/29>> CXR: No PNA 12/31>> RUE Doppler: No DVT 01/01>> MRI brain: No acute intracranial abnormality 01/03>> CXR: Left lingula subsegmental atelectasis.  Significant microbiology data: 12/28>> COVID/influenza/RSV PCR: Negative 12/29>> blood culture: No growth 12/29>> urine culture: Multiple species 12/29>> respiratory virus panel: Negative 01/03>> blood culture: No growth 01/03>> COVID/influenza/RSV PCR: Negative 01/05>> respiratory virus panel: Negative 01/05>> CSF meningitis panel: Negative 01/05>> CSF culture: Pending  Procedures: 1/5>> fluoroscopy-guided LP  Consults: Neurology Palliative  Subjective:  Patient in bed, appears comfortable, denies any headache, no fever, no chest pain or pressure, no shortness of breath , no abdominal pain. No new focal weakness.   Objective: Vitals: Blood pressure 127/69, pulse 78, temperature 99 F (37.2 C), temperature source Axillary, resp. rate 16, height 5' 4 (1.626 m), weight 69 kg, SpO2 96%.   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  Fever Had episodes of fever at [facility] on 1/3-1/5-none since then. Has had extensive workup-see above-no source apparent Although UA suggestive of UTI-urine cultures nondiagnostic Suspicion for aspiration pneumonitis-in the setting of AMS-completed a course of Unasyn  on 1/8.   Acute metabolic encephalopathy Thought to be secondary to infectious disease in the setting of fever-but concerned that patient could be having nonconvulsive seizures-LTM EEG negative for seizures. Overall improved-has underlying dementia-maintain delirium precautions.  Continue Keppra /Vimpat /Depakote .  Seizure disorder See above  Mood disorder Zoloft   GERD PPI  HTN BP stable  Continue amlodipine . Will add low-dose Coreg  for better control  Hypokalemia Repleted.  Hypernatremia Likely secondary to poor oral intake Encourage oral intake Sodium has now stabilized-repeat electrolytes periodically-no longer on half-normal NS.  Dementia Delirium precautions  Palliative care Long discussion with daughter on 1/7-understands poor overall prognosis-we discussed how tube feeds really do not improve quality of life and longevity.  Subsequently evaluated by palliative care on 1/8-DNR-no tube feeds-SNF with palliative follow-up planned.   Pressure Ulcer: Agree with assessment as outlined below. Wound 03/24/24 1721 Pressure Injury Buttocks Medial Stage 2 -  Partial thickness loss of dermis presenting as a shallow open injury with a red, pink wound bed without slough. (Active)   Code status:   Code Status: Limited: Do not attempt resuscitation (DNR) -DNR-LIMITED -Do Not Intubate/DNI    DVT Prophylaxis: enoxaparin  (LOVENOX ) injection 40 mg Start: 04/01/24 1000 SCDs Start: 04/01/24 0023  Family Communication: Daughter-[name] [phone] (work)-updated 04/07/2024, 04/09/2024   Disposition Plan: Status is: Inpatient Remains inpatient appropriate because: Severity of illness   Planned Discharge Destination:Skilled nursing facility   Diet: Diet Order             Diet regular Room service appropriate? Yes with Assist; Fluid consistency: Thin  Diet effective now                     Antimicrobial agents: Anti-infectives (From admission, onward)    Start     Dose/Rate Route Frequency Ordered Stop   04/03/24 1400  Ampicillin -Sulbactam (UNASYN ) 3 g in sodium chloride  0.9 % 100 mL IVPB        3 g 200 mL/hr over 30 Minutes Intravenous Every 6 hours 04/03/24 1340 04/03/24 2144   04/01/24 1100  Ampicillin -Sulbactam (UNASYN ) 3 g in sodium chloride  0.9 % 100 mL IVPB  Status:  Discontinued        3 g 200 mL/hr over 30 Minutes Intravenous Every 6 hours 04/01/24 1003 04/03/24 1340   04/01/24 1000  cefTRIAXone  (ROCEPHIN ) 2 g in sodium chloride  0.9 % 100 mL IVPB  Status:  Discontinued        2 g 200 mL/hr over 30 Minutes Intravenous Every 12 hours 04/01/24 0137 04/01/24 0152   04/01/24 1000  acyclovir  (ZOVIRAX ) 700 mg in dextrose  5 % 100 mL IVPB  Status:  Discontinued        700 mg 114 mL/hr over 60 Minutes Intravenous Every 12 hours 04/01/24 0238 04/01/24 1002   04/01/24 1000  cefTRIAXone  (ROCEPHIN ) 2 g in sodium chloride  0.9 % 100 mL IVPB  Status:  Discontinued        2 g 200 mL/hr over 30 Minutes Intravenous Every 12 hours 04/01/24 0238 04/01/24 1002   04/01/24 1000  vancomycin  (VANCOCIN ) IVPB 1000 mg/200 mL premix  Status:  Discontinued        1,000 mg 200 mL/hr over 60 Minutes Intravenous Every 24 hours 04/01/24 0238 04/01/24 0307   04/01/24 1000  cefTRIAXone  (ROCEPHIN ) 2 g in sodium chloride  0.9 % 100 mL IVPB  Status:  Discontinued        2 g 200 mL/hr over 30 Minutes Intravenous Every 24 hours 04/01/24 0152 04/01/24 0259   04/01/24 0600   ampicillin  (OMNIPEN) 2 g in sodium chloride  0.9 % 100 mL IVPB  Status:  Discontinued        2 g 300 mL/hr over 20 Minutes Intravenous Every 6 hours 04/01/24 0238 04/01/24 1002   04/01/24 0307  vancomycin  variable dose per unstable renal function (pharmacist dosing)  Status:  Discontinued         Does not apply See admin instructions 04/01/24 0307 04/01/24 1002        MEDICATIONS: Scheduled Meds:  amLODipine   10 mg Oral Daily   carvedilol   3.125 mg Oral BID WC   divalproex   250 mg Oral Q8H   enoxaparin  (LOVENOX ) injection  40 mg Subcutaneous Q24H   feeding supplement  237 mL Oral BID BM   lacosamide   50 mg Oral BID   levETIRAcetam   500 mg Oral BID   mouth rinse  15 mL Mouth Rinse 4 times per day   pantoprazole   40 mg Oral Daily   sertraline   25 mg Oral Daily   Continuous Infusions:   PRN Meds:.acetaminophen  **OR** acetaminophen , hydrALAZINE , labetalol , ondansetron  **OR** ondansetron  (ZOFRAN ) IV, mouth rinse   I have personally reviewed following labs and  imaging studies  LABORATORY DATA: CBC: Recent Labs  Lab 04/10/24 0338  WBC 7.0  NEUTROABS 4.4  HGB 10.6*  HCT 32.9*  MCV 93.5  PLT 295    Basic Metabolic Panel: Recent Labs  Lab 04/08/24 0651 04/10/24 0338  NA  --  136  K  --  3.8  CL  --  103  CO2  --  25  GLUCOSE  --  89  BUN  --  12  CREATININE 0.55 0.50  CALCIUM  --  8.9  MG  --  1.9    GFR: Estimated Creatinine Clearance: 57.9 mL/min (by C-G formula based on SCr of 0.5 mg/dL).  Liver Function Tests: No results for input(s): AST, ALT, ALKPHOS, BILITOT, PROT, ALBUMIN  in the last 168 hours.  No results for input(s): LIPASE, AMYLASE in the last 168 hours. No results for input(s): AMMONIA in the last 168 hours.   Coagulation Profile: No results for input(s): INR, PROTIME in the last 168 hours.  Cardiac Enzymes: No results for input(s): CKTOTAL, CKMB, CKMBINDEX, TROPONINI in the last 168 hours.   BNP (last 3  results) Recent Labs    12/06/23 1658  PROBNP 64.3    Lipid Profile: No results for input(s): CHOL, HDL, LDLCALC, TRIG, CHOLHDL, LDLDIRECT in the last 72 hours.  Thyroid Function Tests: No results for input(s): TSH, T4TOTAL, FREET4, T3FREE, THYROIDAB in the last 72 hours.   Anemia Panel: No results for input(s): VITAMINB12, FOLATE, FERRITIN, TIBC, IRON, RETICCTPCT in the last 72 hours.   Urine analysis:    Component Value Date/Time   COLORURINE YELLOW 03/31/2024 2112   APPEARANCEUR CLOUDY (A) 03/31/2024 2112   APPEARANCEUR Clear 05/17/2013 1525   LABSPEC 1.017 03/31/2024 2112   LABSPEC 1.019 05/17/2013 1525   PHURINE 6.0 03/31/2024 2112   GLUCOSEU NEGATIVE 03/31/2024 2112   GLUCOSEU Negative 05/17/2013 1525   HGBUR SMALL (A) 03/31/2024 2112   BILIRUBINUR NEGATIVE 03/31/2024 2112   BILIRUBINUR Negative 05/17/2013 1525   KETONESUR NEGATIVE 03/31/2024 2112   PROTEINUR 30 (A) 03/31/2024 2112   NITRITE NEGATIVE 03/31/2024 2112   LEUKOCYTESUR LARGE (A) 03/31/2024 2112   LEUKOCYTESUR Trace 05/17/2013 1525    Sepsis Labs: Lactic Acid, Venous    Component Value Date/Time   LATICACIDVEN 1.0 03/24/2024 0147    MICROBIOLOGY: No results found for this or any previous visit (from the past 240 hours).   RADIOLOGY STUDIES/RESULTS: No results found.    LOS: 13 days   Lavada Stank, MD  [facility]    To contact the attending provider between 7A-7P or the covering provider during after hours 7P-7A, please log into the web site [email or web address] and access using universal [place] password for that web site. If you do not have the password, please call the hospital operator.  04/13/2024, 8:19 AM    "

## 2024-04-14 LAB — CBC WITH DIFFERENTIAL/PLATELET
Abs Immature Granulocytes: 0.03 K/uL (ref 0.00–0.07)
Basophils Absolute: 0 K/uL (ref 0.0–0.1)
Basophils Relative: 0 %
Eosinophils Absolute: 0.1 K/uL (ref 0.0–0.5)
Eosinophils Relative: 1 %
HCT: 34.3 % — ABNORMAL LOW (ref 36.0–46.0)
Hemoglobin: 11.3 g/dL — ABNORMAL LOW (ref 12.0–15.0)
Immature Granulocytes: 0 %
Lymphocytes Relative: 28 %
Lymphs Abs: 2.1 K/uL (ref 0.7–4.0)
MCH: 30.3 pg (ref 26.0–34.0)
MCHC: 32.9 g/dL (ref 30.0–36.0)
MCV: 92 fL (ref 80.0–100.0)
Monocytes Absolute: 0.8 K/uL (ref 0.1–1.0)
Monocytes Relative: 11 %
Neutro Abs: 4.3 K/uL (ref 1.7–7.7)
Neutrophils Relative %: 60 %
Platelets: 326 K/uL (ref 150–400)
RBC: 3.73 MIL/uL — ABNORMAL LOW (ref 3.87–5.11)
RDW: 14.4 % (ref 11.5–15.5)
WBC: 7.2 K/uL (ref 4.0–10.5)
nRBC: 0 % (ref 0.0–0.2)

## 2024-04-14 LAB — BASIC METABOLIC PANEL WITH GFR
Anion gap: 9 (ref 5–15)
BUN: 17 mg/dL (ref 8–23)
CO2: 26 mmol/L (ref 22–32)
Calcium: 9.3 mg/dL (ref 8.9–10.3)
Chloride: 99 mmol/L (ref 98–111)
Creatinine, Ser: 0.55 mg/dL (ref 0.44–1.00)
GFR, Estimated: >60 mL/min
Glucose, Bld: 101 mg/dL — ABNORMAL HIGH (ref 70–99)
Potassium: 4.6 mmol/L (ref 3.5–5.1)
Sodium: 134 mmol/L — ABNORMAL LOW (ref 135–145)

## 2024-04-14 LAB — MAGNESIUM: Magnesium: 1.9 mg/dL (ref 1.7–2.4)

## 2024-04-14 LAB — PHOSPHORUS: Phosphorus: 2.7 mg/dL (ref 2.5–4.6)

## 2024-04-14 MED ORDER — ISOSORBIDE MONONITRATE ER 30 MG PO TB24
15.0000 mg | ORAL_TABLET | Freq: Every day | ORAL | Status: DC
  Administered 2024-04-14 – 2024-04-22 (×9): 15 mg via ORAL
  Filled 2024-04-14 (×9): qty 1

## 2024-04-14 NOTE — Plan of Care (Signed)

## 2024-04-14 NOTE — Progress Notes (Signed)
 "                        PROGRESS NOTE        PATIENT DETAILS Name: Alicia Brennan Age: 74 y.o. Sex: female Date of Birth: 1950/11/02 Admit Date: 03/31/2024 Admitting Physician Posey Maier, DO ERE:Qzops Tor Edra GRADE, MD  Brief Summary: Patient is a 74 y.o.  female with history of dementia, seizure disorder-who was transferred from [facility] for LTM EEG.  Significant events: 12/28-01/5>> hospitalization at [facility] for altered mental status-fever at [facility] workup negative-transferred to [facility] for LTM EEG. 01/5>> admit to TRH at [facility].  Significant studies: 12/28>> CT head: No acute intracranial abnormality 12/29>> CXR: No PNA 12/31>> RUE Doppler: No DVT 01/01>> MRI brain: No acute intracranial abnormality 01/03>> CXR: Left lingula subsegmental atelectasis.  Significant microbiology data: 12/28>> COVID/influenza/RSV PCR: Negative 12/29>> blood culture: No growth 12/29>> urine culture: Multiple species 12/29>> respiratory virus panel: Negative 01/03>> blood culture: No growth 01/03>> COVID/influenza/RSV PCR: Negative 01/05>> respiratory virus panel: Negative 01/05>> CSF meningitis panel: Negative 01/05>> CSF culture: Pending  Procedures: 1/5>> fluoroscopy-guided LP  Consults: Neurology Palliative  Subjective: Patient in bed, appears comfortable, denies any headache, no fever, no chest pain or pressure, no shortness of breath , no abdominal pain. No focal weakness.   Objective: Vitals: Blood pressure (!) 152/74, pulse 83, temperature 98.6 F (37 C), temperature source Axillary, resp. rate 17, height 5' 4 (1.626 m), weight 69 kg, SpO2 97%.   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  Fever Had episodes of fever at [facility] on 1/3-1/5-none since then. Has had extensive workup-see above-no source apparent Although UA suggestive of UTI-urine cultures nondiagnostic Suspicion for aspiration pneumonitis-in the setting of AMS-completed a course of Unasyn  on 1/8.   Acute metabolic encephalopathy Thought to be secondary to infectious disease in the setting of fever-but concerned that patient could be having nonconvulsive seizures-LTM EEG negative for seizures. Overall improved-has underlying dementia-maintain delirium precautions.  Continue Keppra /Vimpat /Depakote .  Seizure disorder See above  Mood disorder Zoloft   GERD PPI  HTN BP stable  Continue amlodipine . Will add low-dose Coreg  for better control  Hypokalemia Repleted.  Hypernatremia Likely secondary to poor oral intake Encourage oral intake Sodium has now stabilized-repeat electrolytes periodically-no longer on half-normal NS.  Dementia Delirium precautions  Palliative care Long discussion with daughter on 1/7-understands poor overall prognosis-we discussed how tube feeds really do not improve quality of life and longevity.  Subsequently evaluated by palliative care on 1/8-DNR-no tube feeds-SNF with palliative follow-up planned.   Pressure Ulcer: Agree with assessment as outlined below. Wound 03/24/24 1721 Pressure Injury Buttocks Medial Stage 2 -  Partial thickness loss of dermis presenting as a shallow open injury with a red, pink wound bed without slough. (Active)   Code status:   Code Status: Limited: Do not attempt resuscitation (DNR) -DNR-LIMITED -Do Not Intubate/DNI    DVT Prophylaxis: enoxaparin  (LOVENOX ) injection 40 mg Start: 04/01/24 1000 SCDs Start: 04/01/24 0023    Family  Communication: Daughter-[name] [phone] ([phone] (work)-updated 04/07/2024, 04/09/2024   Disposition Plan: Status is: Inpatient Remains inpatient appropriate because: Severity of illness   Planned Discharge Destination:Skilled nursing facility   Diet: Diet Order             Diet regular Room service appropriate? Yes with Assist; Fluid consistency: Thin  Diet effective now                     Antimicrobial agents: Anti-infectives (From admission, onward)    Start     Dose/Rate Route Frequency Ordered Stop   04/03/24 1400  Ampicillin -Sulbactam (UNASYN ) 3 g in sodium chloride  0.9 % 100 mL IVPB        3 g 200 mL/hr over 30 Minutes Intravenous Every 6 hours 04/03/24 1340 04/03/24 2144   04/01/24 1100  Ampicillin -Sulbactam (UNASYN ) 3 g in sodium chloride  0.9 % 100 mL IVPB  Status:  Discontinued        3 g 200 mL/hr over 30 Minutes Intravenous Every 6 hours 04/01/24 1003 04/03/24 1340   04/01/24 1000  cefTRIAXone  (ROCEPHIN ) 2 g in sodium chloride  0.9 % 100 mL IVPB  Status:  Discontinued        2 g 200 mL/hr over 30 Minutes Intravenous Every 12 hours 04/01/24 0137 04/01/24 0152   04/01/24 1000  acyclovir  (ZOVIRAX ) 700 mg in dextrose  5 % 100 mL IVPB  Status:  Discontinued        700 mg 114 mL/hr over 60 Minutes Intravenous Every 12 hours 04/01/24 0238 04/01/24 1002   04/01/24 1000  cefTRIAXone  (ROCEPHIN ) 2 g in sodium chloride  0.9 % 100 mL IVPB  Status:  Discontinued        2 g 200 mL/hr over 30 Minutes Intravenous Every 12 hours 04/01/24 0238 04/01/24 1002   04/01/24 1000  vancomycin  (VANCOCIN ) IVPB 1000 mg/200 mL premix  Status:  Discontinued        1,000 mg 200 mL/hr over 60 Minutes Intravenous Every 24 hours 04/01/24 0238 04/01/24 0307   04/01/24 1000  cefTRIAXone  (ROCEPHIN ) 2 g in sodium chloride  0.9 % 100 mL IVPB  Status:  Discontinued        2 g 200 mL/hr over 30 Minutes Intravenous Every 24 hours 04/01/24 0152 04/01/24 0259   04/01/24 0600   ampicillin  (OMNIPEN) 2 g in sodium chloride  0.9 % 100 mL IVPB  Status:  Discontinued        2 g 300 mL/hr over 20 Minutes Intravenous Every 6 hours 04/01/24 0238 04/01/24 1002   04/01/24 0307  vancomycin  variable dose per unstable renal function (pharmacist dosing)  Status:  Discontinued         Does not apply See admin instructions 04/01/24 0307 04/01/24 1002        MEDICATIONS: Scheduled Meds:  amLODipine   10 mg Oral Daily   carvedilol   3.125 mg Oral BID WC   divalproex   250 mg Oral Q8H   enoxaparin  (LOVENOX ) injection  40 mg Subcutaneous Q24H   feeding supplement  237 mL Oral BID BM   isosorbide  mononitrate  15 mg Oral QHS   lacosamide   50 mg Oral BID   levETIRAcetam   500 mg Oral BID   mouth rinse  15 mL Mouth Rinse 4 times per day   pantoprazole   40 mg Oral Daily   sertraline   25 mg Oral Daily   Continuous Infusions:   PRN Meds:.acetaminophen  **OR** acetaminophen , hydrALAZINE , labetalol , ondansetron  **OR** ondansetron  (ZOFRAN ) IV, mouth rinse  I have personally reviewed following labs and imaging studies  LABORATORY DATA: CBC: Recent Labs  Lab 04/10/24 0338 04/14/24 0239  WBC 7.0 7.2  NEUTROABS 4.4 4.3  HGB 10.6* 11.3*  HCT 32.9* 34.3*  MCV 93.5 92.0  PLT 295 326    Basic Metabolic Panel: Recent Labs  Lab 04/08/24 0651 04/10/24 0338 04/14/24 0239  NA  --  136 134*  K  --  3.8 4.6  CL  --  103 99  CO2  --  25 26  GLUCOSE  --  89 101*  BUN  --  12 17  CREATININE 0.55 0.50 0.55  CALCIUM  --  8.9 9.3  MG  --  1.9 1.9  PHOS  --   --  2.7    GFR: Estimated Creatinine Clearance: 57.9 mL/min (by C-G formula based on SCr of 0.55 mg/dL).  Liver Function Tests: No results for input(s): AST, ALT, ALKPHOS, BILITOT, PROT, ALBUMIN  in the last 168 hours.  No results for input(s): LIPASE, AMYLASE in the last 168 hours. No results for input(s): AMMONIA in the last 168 hours.   Coagulation Profile: No results for input(s): INR,  PROTIME in the last 168 hours.  Cardiac Enzymes: No results for input(s): CKTOTAL, CKMB, CKMBINDEX, TROPONINI in the last 168 hours.   BNP (last 3 results) Recent Labs    12/06/23 1658  PROBNP 64.3    Lipid Profile: No results for input(s): CHOL, HDL, LDLCALC, TRIG, CHOLHDL, LDLDIRECT in the last 72 hours.  Thyroid Function Tests: No results for input(s): TSH, T4TOTAL, FREET4, T3FREE, THYROIDAB in the last 72 hours.   Anemia Panel: No results for input(s): VITAMINB12, FOLATE, FERRITIN, TIBC, IRON, RETICCTPCT in the last 72 hours.   Urine analysis:    Component Value Date/Time   COLORURINE YELLOW 03/31/2024 2112   APPEARANCEUR CLOUDY (A) 03/31/2024 2112   APPEARANCEUR Clear 05/17/2013 1525   LABSPEC 1.017 03/31/2024 2112   LABSPEC 1.019 05/17/2013 1525   PHURINE 6.0 03/31/2024 2112   GLUCOSEU NEGATIVE 03/31/2024 2112   GLUCOSEU Negative 05/17/2013 1525   HGBUR SMALL (A) 03/31/2024 2112   BILIRUBINUR NEGATIVE 03/31/2024 2112   BILIRUBINUR Negative 05/17/2013 1525   KETONESUR NEGATIVE 03/31/2024 2112   PROTEINUR 30 (A) 03/31/2024 2112   NITRITE NEGATIVE 03/31/2024 2112   LEUKOCYTESUR LARGE (A) 03/31/2024 2112   LEUKOCYTESUR Trace 05/17/2013 1525    Sepsis Labs: Lactic Acid, Venous    Component Value Date/Time   LATICACIDVEN 1.0 03/24/2024 0147    MICROBIOLOGY: No results found for this or any previous visit (from the past 240 hours).   RADIOLOGY STUDIES/RESULTS: No results found.    LOS: 14 days   Lavada Stank, MD  [facility]    To contact the attending provider between 7A-7P or the covering provider during after hours 7P-7A, please log into the web site [email or web address] and access using universal [place] password for that web site. If you do not have the password, please call the hospital operator.  04/14/2024, 7:47 AM    "

## 2024-04-14 NOTE — TOC Progression Note (Signed)
 Transition of Care [facility]) - Progression Note    Patient Details  Name: Alicia Brennan MRN: [id number] Date of Birth: 1950-05-23  Transition of Care [facility]) CM/SW Contact  [name], LCSW Phone Number: 04/14/2024, 3:25 PM  Clinical Narrative:    [name] awaiting a bed opening. [facility] still reviewing referral. Updated daughter.    Expected Discharge Plan: Skilled Nursing Facility Barriers to Discharge: Insurance Authorization, SNF Pending bed offer, Continued Medical Work up               Expected Discharge Plan and Services In-house Referral: Clinical Social Work   Post Acute Care Choice: Skilled Nursing Facility Living arrangements for the past 2 months: Skilled Nursing Facility                                       Social Drivers of Health (SDOH) Interventions SDOH Screenings   Food Insecurity: Patient Unable To Answer (03/31/2024)  Recent Concern: Food Insecurity - Food Insecurity Present (01/31/2024)  Housing: Patient Unable To Answer (03/31/2024)  Transportation Needs: No Transportation Needs (01/31/2024)  Utilities: Not At Risk (01/31/2024)  Financial Resource Strain: Low Risk (11/16/2023)   Received from [facility]  Social Connections: Unknown (03/31/2024)  Recent Concern: Social Connections - Moderately Isolated (01/31/2024)  Tobacco Use: Low Risk (04/01/2024)    Readmission Risk Interventions     No data to display

## 2024-04-14 NOTE — Plan of Care (Signed)
  Problem: Clinical Measurements: Goal: Diagnostic test results will improve Outcome: Progressing Goal: Respiratory complications will improve Outcome: Progressing   Problem: Elimination: Goal: Will not experience complications related to urinary retention Outcome: Progressing   

## 2024-04-15 ENCOUNTER — Inpatient Hospital Stay (HOSPITAL_COMMUNITY)

## 2024-04-15 DIAGNOSIS — R609 Edema, unspecified: Secondary | ICD-10-CM

## 2024-04-15 LAB — CREATININE, SERUM
Creatinine, Ser: 0.65 mg/dL (ref 0.44–1.00)
GFR, Estimated: >60 mL/min

## 2024-04-15 MED ORDER — AMLODIPINE BESYLATE 5 MG PO TABS
5.0000 mg | ORAL_TABLET | Freq: Every day | ORAL | Status: DC
  Administered 2024-04-16 – 2024-04-20 (×4): 5 mg via ORAL
  Filled 2024-04-15 (×7): qty 1

## 2024-04-15 NOTE — TOC Progression Note (Signed)
 Transition of Care [facility]) - Progression Note    Patient Details  Name: Alicia Brennan MRN: [id number] Date of Birth: [birth year]  Transition of Care [facility]) CM/SW Contact  [name], LCSW Phone Number: 04/15/2024, 9:57 AM  Clinical Narrative:    Requested [facility] review again. Awaiting response from [facility].    Expected Discharge Plan: Skilled Nursing Facility Barriers to Discharge: Insurance Authorization, SNF Pending bed offer, Continued Medical Work up               Expected Discharge Plan and Services In-house Referral: Clinical Social Work   Post Acute Care Choice: Skilled Nursing Facility Living arrangements for the past 2 months: Skilled Nursing Facility                                       Social Drivers of Health (SDOH) Interventions SDOH Screenings   Food Insecurity: Patient Unable To Answer (03/31/2024)  Recent Concern: Food Insecurity - Food Insecurity Present (01/31/2024)  Housing: Patient Unable To Answer (03/31/2024)  Transportation Needs: No Transportation Needs (01/31/2024)  Utilities: Not At Risk (01/31/2024)  Financial Resource Strain: Low Risk (11/16/2023)   Received from [facility]  Social Connections: Unknown (03/31/2024)  Recent Concern: Social Connections - Moderately Isolated (01/31/2024)  Tobacco Use: Low Risk (04/01/2024)    Readmission Risk Interventions     No data to display

## 2024-04-15 NOTE — Progress Notes (Signed)
 Venous duplex upper ext  has been completed. Refer to [facility] under chart review to view preliminary results.   04/15/2024  11:09 AM Zafar Debrosse, Ricka BIRCH

## 2024-04-15 NOTE — Progress Notes (Signed)
 "                        PROGRESS NOTE        PATIENT DETAILS Name: Alicia Brennan Age: 74 y.o. Sex: female Date of Birth: [birth year] Admit Date: 03/31/2024 Admitting Physician Posey Maier, DO ERE:Qzops Tor Edra GRADE, MD  Brief Summary: Patient is a 74 y.o.  female with history of dementia, seizure disorder-who was transferred from [facility] for LTM EEG.  Significant events: 12/28-01/5>> hospitalization at [facility] for altered mental status-fever at [facility] workup negative-transferred to [facility] for LTM EEG. 01/5>> admit to TRH at [facility].  Significant studies: 12/28>> CT head: No acute intracranial abnormality 12/29>> CXR: No PNA 12/31>> RUE Doppler: No DVT 01/01>> MRI brain: No acute intracranial abnormality 01/03>> CXR: Left lingula subsegmental atelectasis.  Significant microbiology data: 12/28>> COVID/influenza/RSV PCR: Negative 12/29>> blood culture: No growth 12/29>> urine culture: Multiple species 12/29>> respiratory virus panel: Negative 01/03>> blood culture: No growth 01/03>> COVID/influenza/RSV PCR: Negative 01/05>> respiratory virus panel: Negative 01/05>> CSF meningitis panel: Negative 01/05>> CSF culture: Pending  Procedures: 1/5>> fluoroscopy-guided LP  Consults: Neurology Palliative  Subjective: Patient in bed appears to be in no distress denies any headache chest or abdominal pain.   Objective: Vitals: Blood pressure 103/61, pulse 81, temperature 98.6 F (37 C), temperature source Axillary, resp. rate 17, height 5' 4 (1.626 m), weight 69 kg, SpO2 94%.   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  Fever Had episodes of fever at [facility] on 1/3-1/5-none since then. Has had extensive workup-see above-no source apparent Although UA suggestive of UTI-urine cultures nondiagnostic Suspicion for aspiration pneumonitis-in the setting of AMS-completed a course of Unasyn  on 1/8.   Intermittent right arm swelling.  This usually happens due to blood pressure cuff being tight in her right arm, previously we have had right upper quadrant ultrasound to rule out DVT in December which was negative, repeat ultrasound on 04/15/2024 to rule out DVT.  Nursing staff requested to keep the cuff off.    Acute metabolic encephalopathy Thought to be secondary to infectious disease in the setting of fever-but concerned that patient could be having nonconvulsive seizures-LTM EEG negative for seizures. Overall improved-has underlying dementia-maintain delirium precautions.  Continue Keppra /Vimpat /Depakote .  Seizure disorder See above  Mood disorder Zoloft   GERD PPI  HTN BP stable  Continue amlodipine . Will add low-dose Coreg  for better control  Hypokalemia Repleted.  Hypernatremia Likely secondary to poor oral intake Encourage oral intake Sodium has now stabilized-repeat electrolytes periodically-no longer on half-normal NS.  Dementia Delirium precautions  Palliative care Long discussion with daughter on 1/7-understands poor overall prognosis-we discussed how tube feeds really do not improve quality of life and longevity.  Subsequently evaluated by palliative care on 1/8-DNR-no tube feeds-SNF with palliative follow-up planned.   Pressure Ulcer: Agree with assessment as outlined below. Wound 03/24/24 1721 Pressure Injury Buttocks Medial Stage 2 -  Partial thickness loss of dermis presenting as a shallow open injury with a red,  pink wound bed without  slough. (Active)   Code status:   Code Status: Limited: Do not attempt resuscitation (DNR) -DNR-LIMITED -Do Not Intubate/DNI    DVT Prophylaxis: enoxaparin  (LOVENOX ) injection 40 mg Start: 04/01/24 1000 SCDs Start: 04/01/24 0023   Family Communication: Daughter-[name] [phone] (cell)-[phone] (work)-updated 04/07/2024, 04/09/2024   Disposition Plan: Status is: Inpatient Remains inpatient appropriate because: Severity of illness   Planned Discharge Destination:Skilled nursing facility   Diet: Diet Order             Diet regular Room service appropriate? Yes with Assist; Fluid consistency: Thin  Diet effective now                     Data Review:   Patient Lines/Drains/Airways Status     Active Line/Drains/Airways     Name Placement date Placement time Site Days   Peripheral IV 04/02/24 22 G 1 Anterior;Left Forearm 04/02/24  0131  Forearm  13   Ureteral Drain/Stent Left ureter 6 Fr. 02/13/24  1247  Left ureter  62   External Urinary Catheter 04/13/24  0415  --  2   Wound 03/24/24 1721 Pressure Injury Buttocks Medial Stage 2 -  Partial thickness loss of dermis presenting as a shallow open injury with a red, pink wound bed without slough. 03/24/24  1721  Buttocks  22             Inpatient Medications  Scheduled Meds:  amLODipine   5 mg Oral Daily   carvedilol   3.125 mg Oral BID WC   divalproex   250 mg Oral Q8H   enoxaparin  (LOVENOX ) injection  40 mg Subcutaneous Q24H   feeding supplement  237 mL Oral BID BM   isosorbide  mononitrate  15 mg Oral QHS   lacosamide   50 mg Oral BID   levETIRAcetam   500 mg Oral BID   mouth rinse  15 mL Mouth Rinse 4 times per day   pantoprazole   40 mg Oral Daily   sertraline   25 mg Oral Daily   Continuous Infusions: PRN Meds:.acetaminophen  **OR** acetaminophen , hydrALAZINE , labetalol , ondansetron  **OR** ondansetron  (ZOFRAN ) IV, mouth rinse  DVT Prophylaxis  enoxaparin  (LOVENOX ) injection 40 mg Start: 04/01/24  1000 SCDs Start: 04/01/24 0023       Recent Labs  Lab 04/10/24 0338 04/14/24 0239  WBC 7.0 7.2  HGB 10.6* 11.3*  HCT 32.9* 34.3*  PLT 295 326  MCV 93.5 92.0  MCH 30.1 30.3  MCHC 32.2 32.9  RDW 14.4 14.4  LYMPHSABS 1.6 2.1  MONOABS 0.8 0.8  EOSABS 0.1 0.1  BASOSABS 0.0 0.0    Recent Labs  Lab 04/10/24 0338 04/14/24 0239 04/15/24 0555  NA 136 134*  --   K 3.8 4.6  --   CL 103 99  --   CO2 25 26  --   ANIONGAP 8 9  --   GLUCOSE 89 101*  --   BUN 12 17  --   CREATININE 0.50 0.55 0.65  MG 1.9 1.9  --   PHOS  --  2.7  --   CALCIUM 8.9 9.3  --       Recent Labs  Lab 04/10/24 0338 04/14/24 0239  MG 1.9 1.9  CALCIUM 8.9 9.3    --------------------------------------------------------------------------------------------------------------- No results found for: CHOL, HDL, LDLCALC, LDLDIRECT, TRIG, CHOLHDL  No results found for: HGBA1C No results for input(s): TSH, T4TOTAL, FREET4, T3FREE, THYROIDAB in the last 72 hours. No results for input(s): VITAMINB12, FOLATE, FERRITIN, TIBC, IRON,  RETICCTPCT in the last 72 hours. ------------------------------------------------------------------------------------------------------------------ Cardiac Enzymes No results for input(s): CKMB, TROPONINI, MYOGLOBIN in the last 168 hours.  Invalid input(s): CK  Micro Results No results found for this or any previous visit (from the past 240 hours).  Radiology Reports  No results found.    Signature  -   Lavada Stank M.D on 04/15/2024 at 8:22 AM   -  To page go to [email or web address]         "

## 2024-04-15 NOTE — Plan of Care (Signed)

## 2024-04-15 NOTE — Progress Notes (Signed)
 Physical Therapy Treatment Patient Details Name: Alicia Brennan MRN: [id number] DOB: 1951-02-08 Today's Date: 04/15/2024   History of Present Illness 74 yo F adm to 12/29 [facility] with AMS fevere concerns for aspiration pending d/c on 1/4 that was stopped due to seizure and fever. Family requested transfer to [facility] for seizures PMH pulmonary sarcoidosis, IBS, OA, HTN, dementia, R foot drop, chronic R side weakness and seizures.    PT Comments  Pt more alert this session but continues to have difficulty with command following. Pt requires maxAx2 for bed mobility and mod-maxA to maintain trunk control while sitting EOB. No muscle activation detected bilaterally when cued to perform AROM to reposition towards EOB. Pt does attempt to use L UE to hold L knee EOB but does not demonstrate enough muscle activation to improve trunk control EOB. PROM performed to B UE and LE, increased tone noted in all planes. Passive pec stretch and trunk rotation stretch performed to decrease discomfort while in bed. Ongoing discussion regarding transitioning to comfort care, will continue to follow up to align with GOC. Pt would benefit from continued PT services focused on bed mobility, seated balance, and transfers as appropriate to progress functional mobility.     If plan is discharge home, recommend the following: Two people to help with walking and/or transfers;Two people to help with bathing/dressing/bathroom;Assistance with cooking/housework;Assistance with feeding;Direct supervision/assist for medications management;Direct supervision/assist for financial management;Assist for transportation;Help with stairs or ramp for entrance;Supervision due to cognitive status   Can travel by private vehicle     No (Cognition, level of assist required)  Equipment Recommendations  Wheelchair (measurements PT);Wheelchair cushion (measurements PT);Hospital bed;Hoyer lift    Recommendations for Other Services       Precautions /  Restrictions Precautions Precautions: Fall Recall of Precautions/Restrictions: Impaired Precaution/Restrictions Comments: Seizure Restrictions Weight Bearing Restrictions Per Provider Order: No     Mobility  Bed Mobility Overal bed mobility: Needs Assistance Bed Mobility: Supine to Sit, Sit to Supine     Supine to sit: Max assist, +2 for physical assistance Sit to supine: Max assist, +2 for physical assistance   General bed mobility comments: No muslce activation noted when cued to attempt to mobilize B LE towards EOB. Pt maxAx2 for LE and trunk assist. Trunk support required upon sitting EOB.    Transfers                   General transfer comment: STS not attempted due to limited command following and lack of muscle activation noted.    Ambulation/Gait                   Stairs             Wheelchair Mobility     Tilt Bed    Modified Rankin (Stroke Patients Only)       Balance Overall balance assessment: Needs assistance Sitting-balance support: Feet supported, No upper extremity supported Sitting balance-Leahy Scale: Poor Sitting balance - Comments: Pt unable to pull trunk forward while sitting EOB. Mod-maxA for trunk support required throughout. Encouraged to hold onto knees or EOB to support trunk, attempts to hold with  LUE but no change in trunk control. Postural control: Posterior lean                                  Communication Communication Communication: Impaired Factors Affecting Communication: Difficulty expressing self;Reduced clarity of speech  Cognition Arousal: Lethargic Behavior During Therapy: Flat affect   PT - Cognitive impairments: History of cognitive impairments, Sequencing, Difficult to assess Difficult to assess due to: Impaired communication                     PT - Cognition Comments: h/o dementia, minimally expressive, does not respond verbally to questions. Following commands:  Impaired Following commands impaired: Follows one step commands with increased time, Follows one step commands inconsistently    Cueing Cueing Techniques: Verbal cues, Gestural cues, Tactile cues, Visual cues  Exercises General Exercises - Upper Extremity Shoulder ADduction: AAROM, Both, 5 reps, Seated Elbow Flexion: PROM, Both, 5 reps, Seated Elbow Extension: PROM, Both, 5 reps, Seated General Exercises - Lower Extremity Ankle Circles/Pumps: PROM, Both, 5 reps, Seated Long Arc Quad: PROM, 5 reps, Both, Seated Other Exercises Other Exercises: Trunk rotation 1x5 Other Exercises: Seated pec stretch 3x20s    General Comments General comments (skin integrity, edema, etc.): VSS throughout. No new skin abnormalities noted.      Pertinent Vitals/Pain Pain Assessment Pain Assessment: PAINAD Breathing: normal Negative Vocalization: none Facial Expression: smiling or inexpressive Body Language: tense, distressed pacing, fidgeting Consolability: no need to console PAINAD Score: 1 Pain Intervention(s): Limited activity within patient's tolerance, Monitored during session, Repositioned    Home Living                          Prior Function            PT Goals (current goals can now be found in the care plan section) Acute Rehab PT Goals Patient Stated Goal: family interested in comfort care per daughter - ongoing discussion PT Goal Formulation: With family Progress towards PT goals: Not progressing toward goals - comment    Frequency    Min 2X/week      PT Plan      Co-evaluation              AM-PAC PT 6 Clicks Mobility   Outcome Measure  Help needed turning from your back to your side while in a flat bed without using bedrails?: A Lot Help needed moving from lying on your back to sitting on the side of a flat bed without using bedrails?: A Lot Help needed moving to and from a bed to a chair (including a wheelchair)?: Total Help needed standing up  from a chair using your arms (e.g., wheelchair or bedside chair)?: Total Help needed to walk in hospital room?: Total Help needed climbing 3-5 steps with a railing? : Total 6 Click Score: 8    End of Session   Activity Tolerance: Patient limited by fatigue;Patient limited by lethargy Patient left: in bed;with call bell/phone within reach;with bed alarm set Nurse Communication: Mobility status;Need for lift equipment PT Visit Diagnosis: Muscle weakness (generalized) (M62.81);Other abnormalities of gait and mobility (R26.89)     Time: 8499-8481 PT Time Calculation (min) (ACUTE ONLY): 18 min  Charges:    $Therapeutic Activity: 8-22 mins PT General Charges $$ ACUTE PT VISIT: 1 Visit                     Sabra Morel, PT, DPT  Acute Rehabilitation Services         Office: [phone]      Sabra MARLA Morel 04/15/2024, 3:38 PM

## 2024-04-16 ENCOUNTER — Inpatient Hospital Stay (HOSPITAL_COMMUNITY)

## 2024-04-16 NOTE — Progress Notes (Signed)
 Occupational Therapy Treatment Patient Details Name: Alicia Brennan MRN: [id number] DOB: October 01, 1950 Today's Date: 04/16/2024   History of present illness 74 yo F adm to 12/29 [facility] with AMS fevere concerns for aspiration pending d/c on 1/4 that was stopped due to seizure and fever. Family requested transfer to [facility] for seizures PMH pulmonary sarcoidosis, IBS, OA, HTN, dementia, R foot drop, chronic R side weakness and seizures.   OT comments  Pt making steady progress towards OT goals this session. Pt continues to present with impaired sitting balance, R sided weakness, and impaired cognition . Pt currently requires total A +2 for all aspects of bed mobility, pt able to sit EOB for 8 mins with MIN A. Pt unable to follow any commands in relation to reaching tasks but did hold her birthday card in her L hand. Pt was able to stateno and mhmm. Patient will benefit from continued inpatient follow up therapy, <3 hours/day       If plan is discharge home, recommend the following:  Two people to help with walking and/or transfers;Two people to help with bathing/dressing/bathroom   Equipment Recommendations  None recommended by OT    Recommendations for Other Services      Precautions / Restrictions Precautions Precautions: Fall Recall of Precautions/Restrictions: Impaired Precaution/Restrictions Comments: Seizure Restrictions Weight Bearing Restrictions Per Provider Order: No       Mobility Bed Mobility Overal bed mobility: Needs Assistance Bed Mobility: Supine to Sit, Sit to Supine     Supine to sit: Total assist, +2 for physical assistance, +2 for safety/equipment Sit to supine: Total assist, +2 for physical assistance, +2 for safety/equipment   General bed mobility comments: no activated noted, use of bed pads for helicopter method    Transfers                   General transfer comment: STS not attempted due to limited command following and lack of muscle activation  noted.     Balance Overall balance assessment: Needs assistance Sitting-balance support: Feet supported, Bilateral upper extremity supported Sitting balance-Leahy Scale: Poor Sitting balance - Comments: pt required at least MIN A for static sitting balance but able to sit EOB ~ 8 mins Postural control: Posterior lean                                 ADL either performed or assessed with clinical judgement   ADL Overall ADL's : Needs assistance/impaired     Grooming: Total assistance;Bed level;Wash/dry face Grooming Details (indicate cue type and reason): did not initiate washing her face from bed level, required total A     Lower Body Bathing: Total assistance;Bed level Lower Body Bathing Details (indicate cue type and reason): simulated via pericare           Toilet Transfer Details (indicate cue type and reason): deferred Toileting- Clothing Manipulation and Hygiene: Total assistance;Bed level Toileting - Clothing Manipulation Details (indicate cue type and reason): for posterior pericare     Functional mobility during ADLs: Maximal assistance;+2 for physical assistance;+2 for safety/equipment;Total assistance (bed mobility only) General ADL Comments: ADL participation impacted by impaired cognition, impaired balance and generalized deconditioning    Extremity/Trunk Assessment Upper Extremity Assessment RUE Deficits / Details: Global generalized weakness, ?tone versus stiffness to R elbow and shoulder RUE Coordination: decreased fine motor;decreased gross motor LUE Deficits / Details: unable to complete any active reaching, did hold cards in her  L hand LUE Coordination: decreased fine motor;decreased gross motor   Lower Extremity Assessment Lower Extremity Assessment: Defer to PT evaluation        Vision Patient Visual Report: No change from baseline Vision Assessment?: No apparent visual deficits   Perception Perception Perception: Not tested    Praxis Praxis Praxis: Not tested   Communication Communication Communication: Impaired Factors Affecting Communication: Difficulty expressing self;Reduced clarity of speech   Cognition Arousal: Alert Behavior During Therapy: Flat affect Cognition: History of cognitive impairments             OT - Cognition Comments: vascular dementia, pts stated no and mhmm nodded yes when asked if she wanted this OTA to read her birthday cards, pt rolling her eyes at her daughter. did not follow any commands                 Following commands: Impaired Following commands impaired: Follows one step commands inconsistently, Follows multi-step commands inconsistently      Cueing   Cueing Techniques: Verbal cues, Gestural cues, Tactile cues, Visual cues  Exercises Other Exercises Other Exercises: PROM to BLEs from EOB, LAQS x5 reps    Shoulder Instructions       General Comments pts daughter present during part of session, pt with incontinent BM during session, nurse aware    Pertinent Vitals/ Pain       Pain Assessment Pain Assessment: Faces Faces Pain Scale: No hurt  Home Living                                          Prior Functioning/Environment              Frequency  Min 1X/week        Progress Toward Goals  OT Goals(current goals can now be found in the care plan section)  Progress towards OT goals: Progressing toward goals (gradually)  Acute Rehab OT Goals OT Goal Formulation: Patient unable to participate in goal setting Time For Goal Achievement: 04/22/24 Potential to Achieve Goals: Fair  Plan      Co-evaluation                 AM-PAC OT 6 Clicks Daily Activity     Outcome Measure   Help from another person eating meals?: Total Help from another person taking care of personal grooming?: Total Help from another person toileting, which includes using toliet, bedpan, or urinal?: Total Help from another person  bathing (including washing, rinsing, drying)?: Total Help from another person to put on and taking off regular upper body clothing?: Total Help from another person to put on and taking off regular lower body clothing?: Total 6 Click Score: 6    End of Session    OT Visit Diagnosis: Other abnormalities of gait and mobility (R26.89);Muscle weakness (generalized) (M62.81);Other symptoms and signs involving the nervous system (R29.898);Other symptoms and signs involving cognitive function   Activity Tolerance Patient tolerated treatment well   Patient Left in bed;with call bell/phone within reach;with bed alarm set   Nurse Communication Mobility status;Other (comment) (nurse tech)        Time: [phone] OT Time Calculation (min): 41 min  Charges: OT General Charges $OT Visit: 1 Visit OT Treatments $Self Care/Home Management : 38-52 mins  Ronal Mallie POUR., COTA/L Acute Rehabilitation Services [phone]   Ronal Mallie Needy 04/16/2024, 12:08 PM

## 2024-04-16 NOTE — Progress Notes (Signed)
 "                        PROGRESS NOTE        PATIENT DETAILS Name: Alicia Brennan Age: 74 y.o. Sex: female Date of Birth: 01-Oct-1950 Admit Date: 03/31/2024 Admitting Physician Posey Maier, DO ERE:Qzops Tor Edra GRADE, MD  Brief Summary:  Patient is a 74 y.o.  female with history of dementia, seizure disorder-who was transferred from [facility] for LTM EEG.  Significant events: 12/28-01/5>> hospitalization at [facility] for altered mental status-fever at [facility] workup negative-transferred to [facility] for LTM EEG. 01/5>> admit to TRH at [facility]  Significant studies: 12/28>> CT head: No acute intracranial abnormality 12/29>> CXR: No PNA 12/31>> RUE Doppler: No DVT 01/01>> MRI brain: No acute intracranial abnormality 01/03>> CXR: Left lingula subsegmental atelectasis.  Significant microbiology data: 12/28>> COVID/influenza/RSV PCR: Negative 12/29>> blood culture: No growth 12/29>> urine culture: Multiple species 12/29>> respiratory virus panel: Negative 01/03>> blood culture: No growth 01/03>> COVID/influenza/RSV PCR: Negative 01/05>> respiratory virus panel: Negative 01/05>> CSF meningitis panel: Negative 01/05>> CSF culture: Pending  Procedures: 1/5>> fluoroscopy-guided LP  Consults: Neurology Palliative  Subjective:   She denies any complaints today, she was pocketing her food today, reevaluated by SLP again today.   Objective: Vitals: Blood pressure 104/64, pulse 87, temperature 98.2 F (36.8 C), temperature source Oral, resp. rate 17, height 5' 4 (1.626 m), weight 69 kg, SpO2 95%.   Exam:  Patient in bed, frail, deconditioned  Good air entry  Regular rate and rhythm  Abdomen soft  Lower extremity with no edema     Assessment/Plan:   Fever Had episodes of fever at [facility] on 1/3-1/5-none since then. Has had extensive workup-see above-no source apparent Although UA suggestive of UTI-urine cultures nondiagnostic Suspicion for aspiration pneumonitis-in the  setting of AMS-completed a course of Unasyn  on 1/8.   Intermittent right arm swelling.  This usually happens due to blood pressure cuff being tight in her right arm, previously we have had right upper quadrant ultrasound to rule out DVT in December which was negative, repeat ultrasound on 04/15/2024 negative for DVT DVT.  Nursing staff requested to keep the cuff off.    Acute metabolic encephalopathy Thought to be secondary to infectious disease in the setting of fever-but concerned that patient could be having nonconvulsive seizures-LTM EEG negative for seizures. Overall improved-has underlying dementia-maintain delirium precautions.  Continue Keppra /Vimpat /Depakote .  Lung nodules - Reviewing previous record it does appear she had enlarging lung nodules, will obtain CT chest to evaluate for lung nodule/malignancy with lymphadenopathy.  Seizure disorder See above  Mood disorder Zoloft   GERD PPI  HTN BP stable  Continue amlodipine . Will add low-dose Coreg  for better control  Hypokalemia Repleted.  Hypernatremia Likely secondary to poor oral intake Encourage oral intake Sodium has now stabilized-repeat electrolytes periodically-no longer on half-normal NS.  Dementia Delirium precautions  Palliative care Long discussion with daughter on 1/7-understands poor overall prognosis-we discussed how tube feeds really do not improve quality of life and longevity.  Subsequently evaluated by palliative care on 1/8-DNR-no tube feeds-SNF with palliative follow-up planned.   Pressure Ulcer: Agree with assessment as outlined below. Wound 03/24/24 1721 Pressure Injury Buttocks Medial Stage 2 -  Partial thickness loss of dermis presenting as a shallow open injury with a red, pink wound bed without slough. (Active)   Code status:   Code Status: Limited: Do not attempt resuscitation (DNR) -DNR-LIMITED -Do Not Intubate/DNI    DVT Prophylaxis: enoxaparin  (LOVENOX )  injection 40 mg Start:  04/01/24 1000 SCDs Start: 04/01/24 0023   Family Communication: None at bedside  Disposition Plan: Status is: Inpatient Remains inpatient appropriate because: Severity of illness   Planned Discharge Destination:Skilled nursing facility   Diet: Diet Order             Diet regular Room service appropriate? Yes with Assist; Fluid consistency: Thin  Diet effective now                     Data Review:   Patient Lines/Drains/Airways Status     Active Line/Drains/Airways     Name Placement date Placement time Site Days   Peripheral IV 04/02/24 22 G 1 Anterior;Left Forearm 04/02/24  0131  Forearm  14   Ureteral Drain/Stent Left ureter 6 Fr. 02/13/24  1247  Left ureter  63   External Urinary Catheter 04/13/24  0415  --  3   Wound 03/24/24 1721 Pressure Injury Buttocks Medial Stage 2 -  Partial thickness loss of dermis presenting as a shallow open injury with a red, pink wound bed without slough. 03/24/24  1721  Buttocks  23             Inpatient Medications  Scheduled Meds:  amLODipine   5 mg Oral Daily   carvedilol   3.125 mg Oral BID WC   divalproex   250 mg Oral Q8H   enoxaparin  (LOVENOX ) injection  40 mg Subcutaneous Q24H   feeding supplement  237 mL Oral BID BM   isosorbide  mononitrate  15 mg Oral QHS   lacosamide   50 mg Oral BID   levETIRAcetam   500 mg Oral BID   mouth rinse  15 mL Mouth Rinse 4 times per day   pantoprazole   40 mg Oral Daily   sertraline   25 mg Oral Daily   Continuous Infusions: PRN Meds:.acetaminophen  **OR** acetaminophen , hydrALAZINE , labetalol , ondansetron  **OR** ondansetron  (ZOFRAN ) IV, mouth rinse  DVT Prophylaxis  enoxaparin  (LOVENOX ) injection 40 mg Start: 04/01/24 1000 SCDs Start: 04/01/24 0023       Recent Labs  Lab 04/10/24 0338 04/14/24 0239  WBC 7.0 7.2  HGB 10.6* 11.3*  HCT 32.9* 34.3*  PLT 295 326  MCV 93.5 92.0  MCH 30.1 30.3  MCHC 32.2 32.9  RDW 14.4 14.4  LYMPHSABS 1.6 2.1  MONOABS 0.8 0.8  EOSABS 0.1  0.1  BASOSABS 0.0 0.0    Recent Labs  Lab 04/10/24 0338 04/14/24 0239 04/15/24 0555  NA 136 134*  --   K 3.8 4.6  --   CL 103 99  --   CO2 25 26  --   ANIONGAP 8 9  --   GLUCOSE 89 101*  --   BUN 12 17  --   CREATININE 0.50 0.55 0.65  MG 1.9 1.9  --   PHOS  --  2.7  --   CALCIUM 8.9 9.3  --       Recent Labs  Lab 04/10/24 0338 04/14/24 0239  MG 1.9 1.9  CALCIUM 8.9 9.3    --------------------------------------------------------------------------------------------------------------- No results found for: CHOL, HDL, LDLCALC, LDLDIRECT, TRIG, CHOLHDL  No results found for: HGBA1C No results for input(s): TSH, T4TOTAL, FREET4, T3FREE, THYROIDAB in the last 72 hours. No results for input(s): VITAMINB12, FOLATE, FERRITIN, TIBC, IRON, RETICCTPCT in the last 72 hours. ------------------------------------------------------------------------------------------------------------------ Cardiac Enzymes No results for input(s): CKMB, TROPONINI, MYOGLOBIN in the last 168 hours.  Invalid input(s): CK  Micro Results No results found for this or any previous visit (  from the past 240 hours).  Radiology Reports  VAS US  UPPER EXTREMITY VENOUS DUPLEX Result Date: 04/15/2024 UPPER VENOUS STUDY  Patient Name:  Alicia Brennan  Date of Exam:   04/15/2024 Medical Rec #: [id number]       Accession #:    [id number] Date of Birth: June 26, 1950        Patient Gender: F Patient Age:   66 years Exam Location:  [facility] Procedure:      VAS US  UPPER EXTREMITY VENOUS DUPLEX Referring Phys: REDIA CLEAVER --------------------------------------------------------------------------------  Indications: Swelling Other Indications: History of essential hypertension. Risk Factors: Chronic right-sided weakness with associated right foot drop, seizures. Comparison Study: 03/26/24 - Right upper ext venous negative Performing Technologist: Ricka Sturdivant-Jones  RDMS, RVT  Examination Guidelines: A complete evaluation includes B-mode imaging, spectral Doppler, color Doppler, and power Doppler as needed of all accessible portions of each vessel. Bilateral testing is considered an integral part of a complete examination. Limited examinations for reoccurring indications may be performed as noted.  Right Findings: +----------+------------+---------+-----------+----------+-------+ RIGHT     CompressiblePhasicitySpontaneousPropertiesSummary +----------+------------+---------+-----------+----------+-------+ IJV           Full       Yes       Yes                      +----------+------------+---------+-----------+----------+-------+ Subclavian    Full       Yes       Yes                      +----------+------------+---------+-----------+----------+-------+ Axillary      Full       Yes       Yes                      +----------+------------+---------+-----------+----------+-------+ Brachial      Full                                          +----------+------------+---------+-----------+----------+-------+ Radial        Full                                          +----------+------------+---------+-----------+----------+-------+ Ulnar         Full                                          +----------+------------+---------+-----------+----------+-------+ Cephalic      Full                                          +----------+------------+---------+-----------+----------+-------+ Basilic       Full                                          +----------+------------+---------+-----------+----------+-------+  Left Findings: +----------+------------+---------+-----------+----------+-------+ LEFT      CompressiblePhasicitySpontaneousPropertiesSummary +----------+------------+---------+-----------+----------+-------+ IJV           Full       Yes  Yes                       +----------+------------+---------+-----------+----------+-------+ Subclavian    Full       Yes       Yes                      +----------+------------+---------+-----------+----------+-------+ Axillary      Full       Yes       Yes                      +----------+------------+---------+-----------+----------+-------+ Brachial      Full                                          +----------+------------+---------+-----------+----------+-------+ Radial        Full                                          +----------+------------+---------+-----------+----------+-------+ Ulnar         Full                                          +----------+------------+---------+-----------+----------+-------+ Cephalic      Full                                          +----------+------------+---------+-----------+----------+-------+ Basilic       Full                                          +----------+------------+---------+-----------+----------+-------+  Summary: No evidence of deep vein or superficial vein thrombosis involving the right and left upper extremities.  *See table(s) above for measurements and observations.  Diagnosing physician: Gaile New MD Electronically signed by Gaile New MD on 04/15/2024 at 12:44:42 PM.    Final       Signature  -   Brayton Lye M.D on 04/16/2024 at 5:25 PM   -  To page go to [email or web address]         "

## 2024-04-16 NOTE — Evaluation (Signed)
 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   Foley catheter in place    Hypertension    IBS (irritable bowel syndrome)    Leg fracture, left    Lymphedema    Mood disturbance    Recurrent headache    Sarcoidosis of lung    Seizures (HCC)    Thrombocytopenia    Vascular dementia (HCC) 01/31/2024   Wound of buttock    Right, pressure wound   Past Surgical History:  Past Surgical History:  Procedure Laterality Date   BILATERAL ANTERIOR TOTAL HIP ARTHROPLASTY Bilateral 11/15/2015   Procedure: BILATERAL ANTERIOR TOTAL HIP ARTHROPLASTY;  Surgeon: Redell Shoals, MD;  Location: [facility] OR;  Service: Orthopedics;  Laterality: Bilateral;   CARPAL TUNNEL RELEASE Bilateral    CHOLECYSTECTOMY     COLONOSCOPY     CYSTOSCOPY W/ URETERAL STENT PLACEMENT Left 01/03/2024   Procedure: CYSTOSCOPY, WITH RETROGRADE PYELOGRAM AND URETERAL STENT INSERTION;  Surgeon: Devere Lonni Righter, MD;  Location: [facility] ORS;  Service: Urology;  Laterality: Left;   CYSTOSCOPY/URETEROSCOPY/HOLMIUM LASER/STENT PLACEMENT Left 02/13/2024   Procedure: CYSTOSCOPY/URETEROSCOPY/HOLMIUM LASER/STENT PLACEMENT;  Surgeon: Devere Lonni Righter, MD;  Location: [facility] ORS;  Service: Urology;  Laterality: Left;  CYSTOSCOPY/LEFT URETEROSCOPY/HOLMIUM LASER/STENT PLACEMENT   EYE SURGERY     r/t sarcoidisis   ORIF ELBOW FRACTURE Left    TIBIA FRACTURE SURGERY Left    HPI:  74 yo female admitted to [facility] 12/29 with AMS and fever. Transferred to [facility] 1/5 for LTM. MRI negative. Clinical swallow eval on 04/01/24 revealed functional swallow, recs to continue regular solids/thin liquids and no f/u recommended. New consult  1/21 due to increased oral holding and inability to use a straw. Prior to Jan, most recently seen in September 2023 with functional-appearing swallowing and moderate cognitive impairment. PMH includes dementia, pulmonary sarcoidosis, IBS, OA, chronic R sided weakness, seizures    Assessment / Plan / Recommendation  Clinical Impression  PLAN: Continue least restrictive diet, regular consistency; thin liquids as able.  Anticipate fluctuating dysphagia and oral intake related to dementia process.    Pt was drowsy but could be awakened for brief periods of time.  Once she had a sip of water from the cup, she was able to drink sequential sips from a straw without difficulty.  She accepted bites of Magic cup with adequate attention and palpable swallow. There were no concerns for aspiration or oral holding, but I did not push regular solids given her LOA. Her overall eating/swallowing behaviors are consistent with dementia-related dysphagia.  No further SLP needs are identified. Our service will sign off.   SLP Visit Diagnosis: Dysphagia, unspecified (R13.10)    Aspiration Risk  Mild aspiration risk    Diet Recommendation   Age appropriate regular;Thin  Medication Administration: Whole meds with puree    Other Recommendations Oral Care Recommendations: Oral care BID     Swallow Evaluation Recommendations     Assistance Recommended at Discharge    Functional Status Assessment Patient has not had a recent decline in their functional status  Frequency and Duration            Prognosis        Swallow Study  General HPI: 74 yo female admitted to [facility] 12/29 with AMS and fever. Transferred to [facility] 1/5 for LTM. MRI negative. Clinical swallow eval on 04/01/24 revealed functional swallow, recs to continue regular solids/thin liquids and no f/u recommended. New consult 1/21 due to increased oral holding and inability to use a straw. Prior to Jan, most recently seen in September 2023 with  functional-appearing swallowing and moderate cognitive impairment. PMH includes dementia, pulmonary sarcoidosis, IBS, OA, chronic R sided weakness, seizures Type of Study: Bedside Swallow Evaluation Previous Swallow Assessment: see HPI Diet Prior to this Study: Regular;Thin liquids (Level 0) Temperature Spikes Noted: No Respiratory Status: Room air History of Recent Intubation: No Behavior/Cognition: Lethargic/Drowsy Oral Cavity Assessment: Within Functional Limits Oral Care Completed by SLP: No Oral Cavity - Dentition: Adequate natural dentition Vision: Functional for self-feeding Self-Feeding Abilities: Needs assist Patient Positioning: Upright in bed Baseline Vocal Quality: Normal Volitional Cough: Cognitively unable to elicit Volitional Swallow: Unable to elicit    Oral/Motor/Sensory Function Overall Oral Motor/Sensory Function: Within functional limits   Ice Chips Ice chips: Not tested   Thin Liquid Thin Liquid: Within functional limits    Nectar Thick Nectar Thick Liquid: Not tested   Honey Thick Honey Thick Liquid: Not tested   Puree Puree: Within functional limits   Solid     Solid: Not tested      Vona Palma Laurice 04/16/2024,3:29 PM  Palma L. Vona, MA CCC/SLP Clinical Specialist - Acute Care SLP Acute Rehabilitation Services Office number [phone]

## 2024-04-16 NOTE — TOC Progression Note (Addendum)
 Transition of Care [facility]) - Progression Note    Patient Details  Name: Alicia Brennan MRN: [id number] Date of Birth: 12/15/1950  [facility] CM/SW Contact  [name], LCSW Phone Number: 04/16/2024, 9:29 AM  Clinical Narrative:    9:29am-Awaiting response from [facility] and [facility].  11:15 AM-[facility] sadly now does not have an LTC bed available.   Requested [facility] review.    Expected Discharge Plan: Skilled Nursing Facility Barriers to Discharge: Insurance Authorization, SNF Pending bed offer, Continued Medical Work up               Expected Discharge Plan and Services In-house Referral: Clinical Social Work   Post Acute Care Choice: Skilled Nursing Facility Living arrangements for the past 2 months: Skilled Nursing Facility                                       Social Drivers of Health (SDOH) Interventions SDOH Screenings   Food Insecurity: Patient Unable To Answer (03/31/2024)  Recent Concern: Food Insecurity - Food Insecurity Present (01/31/2024)  Housing: Patient Unable To Answer (03/31/2024)  Transportation Needs: No Transportation Needs (01/31/2024)  Utilities: Not At Risk (01/31/2024)  Financial Resource Strain: Low Risk (11/16/2023)   Received from [facility]  Social Connections: Unknown (03/31/2024)  Recent Concern: Social Connections - Moderately Isolated (01/31/2024)  Tobacco Use: Low Risk (04/01/2024)    Readmission Risk Interventions     No data to display

## 2024-04-17 NOTE — Progress Notes (Signed)
 "                        PROGRESS NOTE        PATIENT DETAILS Name: Alicia Brennan Age: 74 y.o. Sex: female Date of Birth: 1951-03-25 Admit Date: 03/31/2024 Admitting Physician Posey Maier, DO ERE:Qzops Tor Edra GRADE, MD  Brief Summary:  Patient is a 74 y.o.  female with history of dementia, seizure disorder-who was transferred from [facility] for LTM EEG.  Significant events: 12/28-01/5>> hospitalization at [facility] for altered mental status-fever at [facility] workup negative-transferred to [facility] for LTM EEG. 01/5>> admit to TRH at [facility].  Significant studies: 12/28>> CT head: No acute intracranial abnormality 12/29>> CXR: No PNA 12/31>> RUE Doppler: No DVT 01/01>> MRI brain: No acute intracranial abnormality 01/03>> CXR: Left lingula subsegmental atelectasis.  Significant microbiology data: 12/28>> COVID/influenza/RSV PCR: Negative 12/29>> blood culture: No growth 12/29>> urine culture: Multiple species 12/29>> respiratory virus panel: Negative 01/03>> blood culture: No growth 01/03>> COVID/influenza/RSV PCR: Negative 01/05>> respiratory virus panel: Negative 01/05>> CSF meningitis panel: Negative 01/05>> CSF culture: Pending  Procedures: 1/5>> fluoroscopy-guided LP  Consults: Neurology Palliative  Subjective:   No significant events overnight, she denies any complaints, passed SLP evaluation yesterday  Objective: Vitals: Blood pressure (!) 170/84, pulse 87, temperature 98.7 F (37.1 C), temperature source Axillary, resp. rate 18, height 5' 4 (1.626 m), weight 67.5 kg, SpO2 95%.   Exam:  Patient in bed, awake, frail, deconditioned  Good air entry Regular rate and rhythm  Abdomen soft  Lower extremity with no edema     Assessment/Plan:   Fever Had episodes of fever at [facility] on 1/3-1/5-none since then. Has had extensive workup-see above-no source apparent Although UA suggestive of UTI-urine cultures nondiagnostic Suspicion for aspiration  pneumonitis-in the setting of AMS-completed a course of Unasyn  on 1/8.   Intermittent right arm swelling.  - This usually happens due to blood pressure cuff being tight in her right arm, previously we have had right upper quadrant ultrasound to rule out DVT in December which was negative, repeat ultrasound on 04/15/2024 negative for DVT DVT.  Nursing staff requested to keep the cuff off.    Acute metabolic encephalopathy Thought to be secondary to infectious disease in the setting of fever-but concerned that patient could be having nonconvulsive seizures -LTM EEG negative for seizures. Overall improved-has underlying dementia-maintain delirium precautions.  Continue Keppra /Vimpat /Depakote .  Lung nodules - Reviewing previous record it does appear she had enlarging lung nodules, CT chest was obtained with showing stable lung nodules  - Evidence of secretions in mid trachea, will start on Mucinex and consult RT for chest PT  Seizure disorder See above  Mood disorder Zoloft   GERD PPI  HTN BP stable  Continue amlodipine . Will add low-dose Coreg  for better control  Hypokalemia Repleted.  Hypernatremia Likely secondary to poor oral intake Encourage oral intake Sodium has now stabilized-repeat electrolytes periodically-no longer on half-normal NS.  Dementia Delirium precautions  Palliative care Long discussion with daughter on 1/7-understands poor overall prognosis-we discussed how tube feeds really do not improve quality of life and longevity.  Subsequently evaluated by palliative care on 1/8-DNR-no tube feeds-SNF with palliative follow-up planned.   Pressure Ulcer: Agree with assessment as outlined below. Wound 03/24/24 1721 Pressure Injury Buttocks Medial Stage 2 -  Partial thickness loss of dermis presenting as a shallow open injury with a red, pink wound bed without slough. (Active)   Code status:   Code Status: Limited: Do not  attempt resuscitation (DNR) -DNR-LIMITED -Do  Not Intubate/DNI    DVT Prophylaxis: enoxaparin  (LOVENOX ) injection 40 mg Start: 04/01/24 1000 SCDs Start: 04/01/24 0023   Family Communication: None at bedside  Disposition Plan: Status is: Inpatient Remains inpatient appropriate because: Severity of illness   Planned Discharge Destination:Skilled nursing facility   Diet: Diet Order             Diet regular Room service appropriate? Yes with Assist; Fluid consistency: Thin  Diet effective now                     Data Review:   Patient Lines/Drains/Airways Status     Active Line/Drains/Airways     Name Placement date Placement time Site Days   Peripheral IV 04/02/24 22 G 1 Anterior;Left Forearm 04/02/24  0131  Forearm  15   Ureteral Drain/Stent Left ureter 6 Fr. 02/13/24  1247  Left ureter  64   External Urinary Catheter 04/13/24  0415  --  4   Wound 03/24/24 1721 Pressure Injury Buttocks Medial Stage 2 -  Partial thickness loss of dermis presenting as a shallow open injury with a red, pink wound bed without slough. 03/24/24  1721  Buttocks  24             Inpatient Medications  Scheduled Meds:  amLODipine   5 mg Oral Daily   carvedilol   3.125 mg Oral BID WC   divalproex   250 mg Oral Q8H   enoxaparin  (LOVENOX ) injection  40 mg Subcutaneous Q24H   feeding supplement  237 mL Oral BID BM   isosorbide  mononitrate  15 mg Oral QHS   lacosamide   50 mg Oral BID   levETIRAcetam   500 mg Oral BID   mouth rinse  15 mL Mouth Rinse 4 times per day   pantoprazole   40 mg Oral Daily   sertraline   25 mg Oral Daily   Continuous Infusions: PRN Meds:.acetaminophen  **OR** acetaminophen , hydrALAZINE , labetalol , ondansetron  **OR** ondansetron  (ZOFRAN ) IV, mouth rinse  DVT Prophylaxis  enoxaparin  (LOVENOX ) injection 40 mg Start: 04/01/24 1000 SCDs Start: 04/01/24 0023       Recent Labs  Lab 04/14/24 0239  WBC 7.2  HGB 11.3*  HCT 34.3*  PLT 326  MCV 92.0  MCH 30.3  MCHC 32.9  RDW 14.4  LYMPHSABS 2.1   MONOABS 0.8  EOSABS 0.1  BASOSABS 0.0    Recent Labs  Lab 04/14/24 0239 04/15/24 0555  NA 134*  --   K 4.6  --   CL 99  --   CO2 26  --   ANIONGAP 9  --   GLUCOSE 101*  --   BUN 17  --   CREATININE 0.55 0.65  MG 1.9  --   PHOS 2.7  --   CALCIUM 9.3  --       Recent Labs  Lab 04/14/24 0239  MG 1.9  CALCIUM 9.3    --------------------------------------------------------------------------------------------------------------- No results found for: CHOL, HDL, LDLCALC, LDLDIRECT, TRIG, CHOLHDL  No results found for: HGBA1C No results for input(s): TSH, T4TOTAL, FREET4, T3FREE, THYROIDAB in the last 72 hours. No results for input(s): VITAMINB12, FOLATE, FERRITIN, TIBC, IRON, RETICCTPCT in the last 72 hours. ------------------------------------------------------------------------------------------------------------------ Cardiac Enzymes No results for input(s): CKMB, TROPONINI, MYOGLOBIN in the last 168 hours.  Invalid input(s): CK  Micro Results No results found for this or any previous visit (from the past 240 hours).  Radiology Reports  CT CHEST WO CONTRAST Result Date: 04/16/2024 EXAM: CT CHEST  WITHOUT CONTRAST 04/16/2024 07:11:53 PM TECHNIQUE: CT of the chest was performed without the administration of intravenous contrast. Multiplanar reformatted images are provided for review. Automated exposure control, iterative reconstruction, and/or weight based adjustment of the mA/kV was utilized to reduce the radiation dose to as low as reasonably achievable. COMPARISON: Prior study from 08/06/2015. CLINICAL HISTORY: Pulmonary nodule seen on CT cervical spine last year, as well evaluate for lymphadenopathy mainly in the right axilla given persistent swelling. FINDINGS: MEDIASTINUM: Heart and pericardium are unremarkable. Layering low-density nodular area noted in the mid trachea, likely mucus. LYMPH NODES: No mediastinal, hilar or  axillary lymphadenopathy. In particular, no right axillary adenopathy or abnormality. LUNGS AND PLEURA: Medial right apical nodule again noted measuring 10 mm on image 30 of series 5. Not significantly changed when compared to prior study. This measured only minimally smaller when compared to the study from 08/06/2015, when this measured maximally 9 mm. No significant change over nearly 7 years is compatible with a benign nodule. No new or enlarging pulmonary nodules. Linear scarring in the lung bases. No focal consolidation or pulmonary edema. No pleural effusion or pneumothorax. SOFT TISSUES/BONES: No acute abnormality of the bones or soft tissues. UPPER ABDOMEN: Limited images of the upper abdomen demonstrates no acute abnormality. VASCULATURE: Scattered aortic atherosclerosis. IMPRESSION: 1. Medial right apical nodule measuring 10 mm, not significantly changed dating back to 2017 compatible with a benign nodule. 2. No new or enlarging pulmonary nodules. 3. Aortic atherosclerosis. 4. Low density material layering dependently in the mid trachea, likely mucus/secretions. Electronically signed by: Franky Crease MD 04/16/2024 07:21 PM EST RP Workstation: [id number]      Signature  -   Brayton Lye M.D on 04/17/2024 at 4:29 PM   -  To page go to [email or web address]         "

## 2024-04-17 NOTE — Plan of Care (Signed)
  Problem: Education: Goal: Knowledge of General Education information will improve Description: Including pain rating scale, medication(s)/side effects and non-pharmacologic comfort measures Outcome: Progressing   Problem: Clinical Measurements: Goal: Will remain free from infection Outcome: Progressing   Problem: Activity: Goal: Risk for activity intolerance will decrease Outcome: Progressing   Problem: Nutrition: Goal: Adequate nutrition will be maintained Outcome: Progressing   Problem: Elimination: Goal: Will not experience complications related to bowel motility Outcome: Progressing Goal: Will not experience complications related to urinary retention Outcome: Progressing

## 2024-04-17 NOTE — Progress Notes (Signed)
 Palliative:  HPI: 74 y.o. 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 Was at [facility] for fever; extensive work up was negative. Has not had fevers since 1/5. Suspicion she had aspiration pneumonitis. LTM EEG was negative. Overall has improved. Neurology recommended continuation of home AEDs. PMT consulted for GOC discussion.    I discussed with CSW Nadia. Ms. Hatler is awake. She is sitting in bed calmly. No family at bedside. She reports no pain. I offered her ensure to her but she declines. When I tell her I will call her daughter to talk more she smiles at me.   I was able to connect with daughter, [name], over phone. She tells me she is feeling better than she previously was; not as overwhelmed. She expresses ongoing desire to care for the patient at home but at this time is not feasible as no one is able to provide the around the clock care the patient needs at home. She mentions looking into option for at home care but tells me she wont be able to have this in place prior to patient's discharge. She understands a facility is needed at that time. I did explain hospice benefit and what they would provide in the home. She understands this and knows it is not enough support in the home.   She remains committed to decision of LTC and with hospice support to follow.   [name] remains concerned about the facility options for her mother. She wonders why some of the facilities have declined to accept her mother.   All questions/concerns addressed to the best of my ability. Emotional support provided.   Exam: Awake, alert. Minimally interactive. No distress. Breathing regular, unlabored. Abd soft. Generalized weakness and fatigue.   Plan: - DNR/DNI - No feeding tube - Transition to LTC with hospice per  family request  25 min  Cristofher Livecchi Jama Barnacle, DNP, [facility] Palliative Medicine Team Team Phone # [phone]  Pager # [phone]

## 2024-04-17 NOTE — Progress Notes (Signed)
 Nutrition Follow-up  DOCUMENTATION CODES:   Not applicable  INTERVENTION:   -Continue regular diet -Continue Ensure Plus High Protein po BID, each supplement provides 350 kcal and 20 grams of protein  -Continue Magic cup TID with meals, each supplement provides 290 kcal and 9 grams of protein  -Continue feeding assistance with meals  -Obtain new weight   NUTRITION DIAGNOSIS:   Inadequate oral intake related to lethargy/confusion as evidenced by meal completion < 25%.  Ongoing   GOAL:   Other (Comment) (focus on nutrition intake as it relates to comfort and avoiding electrolyte derangement)  Unmet  MONITOR:   PO intake, Supplement acceptance, Diet advancement  REASON FOR ASSESSMENT:   Consult Assessment of nutrition requirement/status  ASSESSMENT:   Pt with PMH significant for: dementia, seizure disorder, sarcoidosis of lung, HTN, arthritis, IBS and cancer (2008). Originally admitted to [facility] for AMS and fever. Work up was negative and patient transferred to [facility] for LTM EEG.  12/28 - 1/05 admitted to [facility] with work up unrevealing 1/05 - transfer to [facility] for LTM EEG 1/21- s/p BSE- regular diet for thin liquids  Reviewed I/O's: -500 ml x 24 hours and -9.6 L since 04/03/24  UOP: 500 ml x 24 hours  Patient sitting up in bed at time of visit. Patient made eye contact with this RD when spoken to, but did not answer questions. No family at bedside.   Patient shook her head when asked how she was feeling today.   Observed breakfast tray on tray table; patient consumed 50% of pancake, 50% of milk, and 100% of orange juice and Glucerna shake (426 kcals, 16 grams of protein).   Minimal meal completion data available to assess, but noted meal completions ranging from 10-40%. Per meal completions on 1/19, patient consumed 338 kcals and 13 grams of protein for that day.   No new weight since 03/31/24- attempted to weigh on bedscale, but unsuccessful.   Per MD, no plans for feeding  tube and plan for SNF placement with palliative follow-up.   Pt with poor oral intake and would benefit from nutrient dense supplement. One Ensure Enlive supplement provides 350 kcals, 20 grams protein, and 44-45 grams of carbohydrate vs one Glucerna shake supplement, which provides 220 kcals, 10 grams of protein, and 26 grams of carbohydrate. Given pt's hx of DM, RD will reassess adequacy of PO intake, CBGS, and adjust supplement regimen as appropriate at follow-up.    Medications reviewed and include depakote , lovenox , imdur , and protonix .   Labs reviewed: Na: 134, K, Mg, and Phos WDL.    NUTRITION - FOCUSED PHYSICAL EXAM:  Flowsheet Row Most Recent Value  Orbital Region Mild depletion  Upper Arm Region No depletion  Thoracic and Lumbar Region No depletion  Buccal Region Mild depletion  Temple Region Mild depletion  Clavicle Bone Region No depletion  Clavicle and Acromion Bone Region No depletion  Scapular Bone Region No depletion  Dorsal Hand No depletion  Patellar Region No depletion  Anterior Thigh Region No depletion  Posterior Calf Region No depletion  Edema (RD Assessment) Mild  Hair Reviewed  Eyes Reviewed  Mouth Reviewed  Skin Reviewed  Nails Reviewed    Diet Order:   Diet Order             Diet regular Room service appropriate? Yes with Assist; Fluid consistency: Thin  Diet effective now                   EDUCATION NEEDS:  Not appropriate for education at this time  Skin:  Skin Assessment: Skin Integrity Issues: Skin Integrity Issues:: Stage II Stage II: medial buttocks  Last BM:  04/16/24 (type 4)  Height:   Ht Readings from Last 1 Encounters:  03/31/24 5' 4 (1.626 m)    Weight:   Wt Readings from Last 1 Encounters:  03/31/24 69 kg    Ideal Body Weight:  54.5 kg  BMI:  Body mass index is 26.11 kg/m.  Estimated Nutritional Needs:   Kcal:  1400-1600 kcals  Protein:  70-85g  Fluid:  1.4-1.6L/day    Margery ORN, RD, LDN,  CDCES Registered Dietitian III Certified Diabetes Care and Education Specialist If unable to reach this RD, please use RD Inpatient group chat on secure chat between hours of 8am-4 pm daily

## 2024-04-17 NOTE — TOC Progression Note (Signed)
 Transition of Care [facility]) - Progression Note    Patient Details  Name: Alicia Brennan MRN: [id number] Date of Birth: [birth year]  Transition [facility] CM/SW Contact  [name], LCSW Phone Number: 04/17/2024, 4:01 PM  Clinical Narrative:    [name] reports a bed opening tomorrow versus Monday depending on a discharge. CSW updated daughter. No other bed offers at this time.    Expected Discharge Plan: Skilled Nursing Facility Barriers to Discharge: Insurance Authorization, SNF Pending bed offer, Continued Medical Work up               Expected Discharge Plan and Services In-house Referral: Clinical Social Work   Post Acute Care Choice: Skilled Nursing Facility Living arrangements for the past 2 months: Skilled Nursing Facility                                       Social Drivers of Health (SDOH) Interventions SDOH Screenings   Food Insecurity: Patient Unable To Answer (03/31/2024)  Recent Concern: Food Insecurity - Food Insecurity Present (01/31/2024)  Housing: Patient Unable To Answer (03/31/2024)  Transportation Needs: No Transportation Needs (01/31/2024)  Utilities: Not At Risk (01/31/2024)  Financial Resource Strain: Low Risk (11/16/2023)   Received from [facility]  Social Connections: Unknown (03/31/2024)  Recent Concern: Social Connections - Moderately Isolated (01/31/2024)  Tobacco Use: Low Risk (04/01/2024)    Readmission Risk Interventions     No data to display

## 2024-04-17 NOTE — Plan of Care (Signed)
  Problem: Health Behavior/Discharge Planning: Goal: Ability to manage health-related needs will improve Outcome: Progressing   Problem: Clinical Measurements: Goal: Ability to maintain clinical measurements within normal limits will improve Outcome: Progressing   Problem: Nutrition: Goal: Adequate nutrition will be maintained Outcome: Progressing   

## 2024-04-18 DIAGNOSIS — F03C0 Unspecified dementia, severe, without behavioral disturbance, psychotic disturbance, mood disturbance, and anxiety: Secondary | ICD-10-CM

## 2024-04-18 DIAGNOSIS — Z66 Do not resuscitate: Secondary | ICD-10-CM

## 2024-04-18 NOTE — Progress Notes (Signed)
 [facility] [phone] [facility] Liaison Note  Received previous request from [facility] [place] for hospice services at LTC after discharge.   Liaison continues to follow for discharge.   Please send signed and completed DNR home with patient/family. Please provide prescriptions at discharge as needed to ensure ongoing symptom management.  [facility] information and contact numbers given to daughter Mina.  Please call with any concerns.  Thank you for the opportunity to participate in this patient's care.   Eleanor Nail, LPN [facility] Liaison [phone]

## 2024-04-18 NOTE — Progress Notes (Signed)
 PT Cancellation Note  Patient Details Name: Alicia Brennan MRN: [id number] DOB: April 13, 1950   Cancelled Treatment:    Reason Eval/Treat Not Completed: (P) Patient declined, no reason specified. Pt sleeping upon arrival, wakes briefly and looks at PT while briefly shaking head no. Pt encouraged to mobilize but continues to keep eyes closed. Will return later today if able, otherwise will follow up next date.   Sabra Morel, PT, DPT  Acute Rehabilitation Services         Office: [phone]      Sabra MARLA Morel 04/18/2024, 2:46 PM

## 2024-04-18 NOTE — Plan of Care (Signed)
  Problem: Education: Goal: Knowledge of General Education information will improve Description: Including pain rating scale, medication(s)/side effects and non-pharmacologic comfort measures Outcome: Progressing   Problem: Clinical Measurements: Goal: Will remain free from infection Outcome: Progressing   Problem: Activity: Goal: Risk for activity intolerance will decrease Outcome: Progressing   Problem: Elimination: Goal: Will not experience complications related to urinary retention Outcome: Progressing

## 2024-04-18 NOTE — Progress Notes (Signed)
 Palliative:  HPI: 74 y.o. 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 Was at [facility] for fever; extensive work up was negative. Has not had fevers since 1/5. Suspicion she had aspiration pneumonitis. LTM EEG was negative. Overall has improved. Neurology recommended continuation of home AEDs. PMT consulted for GOC discussion.    Called to bedside by RN per family request. Arrived to unit - received update from Dr. Sherlon that family is requesting more conversation regarding palliative vs hospice vs full comfort measures in the hospital.  Received update from RN as well - some concern from RN that patient has had very little intake today.   Met with patient's daughter [name] and her 2 cousins. They express frustration surrounding visitation policy - currently no one under 12 allowed due to flu season unless patient is actively dying.   We review palliative care vs hospice. Discussed differences in them. We also reviewed what full comfort care in the hospital would look like for Ms. Alicia Brennan. I explained comfort care as care where the patient would no longer receive aggressive medical interventions such as continuous vital signs, lab work, radiology testing, or medications not focused on comfort, peace, and dignity. This includes stopping antibiotics and weaning oxygen to room air, as these are generally not accepted as providing comfort but only prolonging the dying process artificially. All care would focus on how the patient is looking and feeling. This would include management of any symptoms that may cause discomfort, pain, shortness of breath/air hunger, increased work of breathing, cough, nausea, agitation/restlessness, anxiety, and/or secretions etc. Symptoms would be managed with medications and other  non-pharmacological interventions such as spiritual support if requested, repositioning, music therapy, or therapeutic listening. Family is clear they are not ready for full comfort measures and want to continue the current measures patient is receiving.  We review outpatient hospice vs palliative to follow at facility. They are clear they want hospice at the facility.  Lastly we review each section of MOST form. [name] is going to review and we plan to complete it tomorrow.    [name] remains concerned about the facility options for her mother. She wonders why some of the facilities have declined to accept her mother. We discuss option of hospice facility in the future as patient declines more, becomes more appropriate. Or even patient going home with hospice in her final days.   All questions/concerns addressed to the best of my ability. Emotional support provided.   Exam: Awake, alert. Minimally interactive. No distress. Breathing regular, unlabored. Abd soft. Generalized weakness and fatigue.   Plan: - DNR/DNI - No feeding tube - Transition to LTC with hospice per family request  55 min  Tylar Amborn Jama Barnacle, DNP, [facility] Palliative Medicine Team Team Phone # [phone]  Pager # [phone]

## 2024-04-18 NOTE — Progress Notes (Signed)
 "                        PROGRESS NOTE        PATIENT DETAILS Name: Alicia Brennan Age: 74 y.o. Sex: female Date of Birth: 1950/04/17 Admit Date: 03/31/2024 Admitting Physician Posey Maier, DO ERE:Qzops Tor Edra GRADE, MD  Brief Summary:  Patient is a 73 y.o.  female with history of dementia, seizure disorder-who was transferred from [facility] for LTM EEG.  Significant events: 12/28-01/5>> hospitalization at [facility] for altered mental status-fever at [facility] workup negative-transferred to [facility] for LTM EEG. 01/5>> admit to TRH at [facility].  Significant studies: 12/28>> CT head: No acute intracranial abnormality 12/29>> CXR: No PNA 12/31>> RUE Doppler: No DVT 01/01>> MRI brain: No acute intracranial abnormality 01/03>> CXR: Left lingula subsegmental atelectasis.  Significant microbiology data: 12/28>> COVID/influenza/RSV PCR: Negative 12/29>> blood culture: No growth 12/29>> urine culture: Multiple species 12/29>> respiratory virus panel: Negative 01/03>> blood culture: No growth 01/03>> COVID/influenza/RSV PCR: Negative 01/05>> respiratory virus panel: Negative 01/05>> CSF meningitis panel: Negative 01/05>> CSF culture: Pending  Procedures: 1/5>> fluoroscopy-guided LP  Consults: Neurology Palliative  Subjective:   No significant events overnight, she remains with poor appetite, pocketing her food  Objective: Vitals: Blood pressure 139/72, pulse 76, temperature 98.6 F (37 C), temperature source Axillary, resp. rate 18, height 5' 4 (1.626 m), weight 67.5 kg, SpO2 98%.   Exam:  In bed, awake, frail, deconditioned Good air entry Regular rate and rhythm Abdomen soft  Lower extremity with no edema     Assessment/Plan:   Fever Had episodes of fever at [facility] on 1/3-1/5-none since then. Has had extensive workup-see above-no source apparent Although UA suggestive of UTI-urine cultures nondiagnostic Suspicion for aspiration pneumonitis-in the setting of  AMS-completed a course of Unasyn  on 1/8.   Intermittent right arm swelling.  - This usually happens due to blood pressure cuff being tight in her right arm, previously we have had right upper quadrant ultrasound to rule out DVT in December which was negative, repeat ultrasound on 04/15/2024 negative for DVT DVT.  Nursing staff requested to keep the cuff off.    Acute metabolic encephalopathy Thought to be secondary to infectious disease in the setting of fever-but concerned that patient could be having nonconvulsive seizures -LTM EEG negative for seizures. Overall improved-has underlying dementia-maintain delirium precautions.  Continue Keppra /Vimpat /Depakote .  Lung nodules - Reviewing previous record it does appear she had enlarging lung nodules, CT chest was obtained with showing stable lung nodules  - Evidence of secretions in mid trachea, will start on Mucinex and consult RT for chest PT  Seizure disorder See above  Mood disorder Zoloft   GERD PPI  HTN BP stable  Continue amlodipine . Will add low-dose Coreg  for better control  Hypokalemia Repleted.  Hypernatremia Likely secondary to poor oral intake Encourage oral intake Sodium has now stabilized-repeat electrolytes periodically-no longer on half-normal NS.  Dementia Delirium precautions  Palliative care Long discussion with daughter on 1/7-understands poor overall prognosis-we discussed how tube feeds really do not improve quality of life and longevity.  Subsequently evaluated by palliative care on 1/8-DNR-no tube feeds-SNF with palliative follow-up planned.   Pressure Ulcer: Agree with assessment as outlined below. Wound 03/24/24 1721 Pressure Injury Buttocks Medial Stage 2 -  Partial thickness loss of dermis presenting as a shallow open injury with a red, pink wound bed without slough. (Active)   Code status:   Code Status: Limited: Do not attempt resuscitation (DNR) -DNR-LIMITED -  Do Not Intubate/DNI    DVT  Prophylaxis: enoxaparin  (LOVENOX ) injection 40 mg Start: 04/01/24 1000 SCDs Start: 04/01/24 0023   Family Communication:  discussed with daughter at bedside  Disposition Plan: Status is: Inpatient Remains inpatient appropriate because: Severity of illness   Planned Discharge Destination:Skilled nursing facility   Diet: Diet Order             Diet regular Room service appropriate? Yes with Assist; Fluid consistency: Thin  Diet effective now                     Data Review:   Patient Lines/Drains/Airways Status     Active Line/Drains/Airways     Name Placement date Placement time Site Days   Peripheral IV 04/17/24 22 G 1.75 Anterior;Left;Proximal Forearm 04/17/24  2249  Forearm  1   Ureteral Drain/Stent Left ureter 6 Fr. 02/13/24  1247  Left ureter  65   External Urinary Catheter 04/13/24  0415  --  5   Wound 03/24/24 1721 Pressure Injury Buttocks Medial Stage 2 -  Partial thickness loss of dermis presenting as a shallow open injury with a red, pink wound bed without slough. 03/24/24  1721  Buttocks  25             Inpatient Medications  Scheduled Meds:  amLODipine   5 mg Oral Daily   carvedilol   3.125 mg Oral BID WC   divalproex   250 mg Oral Q8H   enoxaparin  (LOVENOX ) injection  40 mg Subcutaneous Q24H   feeding supplement  237 mL Oral BID BM   isosorbide  mononitrate  15 mg Oral QHS   lacosamide   50 mg Oral BID   levETIRAcetam   500 mg Oral BID   mouth rinse  15 mL Mouth Rinse 4 times per day   pantoprazole   40 mg Oral Daily   sertraline   25 mg Oral Daily   Continuous Infusions: PRN Meds:.acetaminophen  **OR** acetaminophen , hydrALAZINE , labetalol , ondansetron  **OR** ondansetron  (ZOFRAN ) IV, mouth rinse  DVT Prophylaxis  enoxaparin  (LOVENOX ) injection 40 mg Start: 04/01/24 1000 SCDs Start: 04/01/24 0023       Recent Labs  Lab 04/14/24 0239  WBC 7.2  HGB 11.3*  HCT 34.3*  PLT 326  MCV 92.0  MCH 30.3  MCHC 32.9  RDW 14.4  LYMPHSABS 2.1   MONOABS 0.8  EOSABS 0.1  BASOSABS 0.0    Recent Labs  Lab 04/14/24 0239 04/15/24 0555  NA 134*  --   K 4.6  --   CL 99  --   CO2 26  --   ANIONGAP 9  --   GLUCOSE 101*  --   BUN 17  --   CREATININE 0.55 0.65  MG 1.9  --   PHOS 2.7  --   CALCIUM 9.3  --       Recent Labs  Lab 04/14/24 0239  MG 1.9  CALCIUM 9.3    --------------------------------------------------------------------------------------------------------------- No results found for: CHOL, HDL, LDLCALC, LDLDIRECT, TRIG, CHOLHDL  No results found for: HGBA1C No results for input(s): TSH, T4TOTAL, FREET4, T3FREE, THYROIDAB in the last 72 hours. No results for input(s): VITAMINB12, FOLATE, FERRITIN, TIBC, IRON, RETICCTPCT in the last 72 hours. ------------------------------------------------------------------------------------------------------------------ Cardiac Enzymes No results for input(s): CKMB, TROPONINI, MYOGLOBIN in the last 168 hours.  Invalid input(s): CK  Micro Results No results found for this or any previous visit (from the past 240 hours).  Radiology Reports  CT CHEST WO CONTRAST Result Date: 04/16/2024 EXAM: CT CHEST WITHOUT  CONTRAST 04/16/2024 07:11:53 PM TECHNIQUE: CT of the chest was performed without the administration of intravenous contrast. Multiplanar reformatted images are provided for review. Automated exposure control, iterative reconstruction, and/or weight based adjustment of the mA/kV was utilized to reduce the radiation dose to as low as reasonably achievable. COMPARISON: Prior study from 08/06/2015. CLINICAL HISTORY: Pulmonary nodule seen on CT cervical spine last year, as well evaluate for lymphadenopathy mainly in the right axilla given persistent swelling. FINDINGS: MEDIASTINUM: Heart and pericardium are unremarkable. Layering low-density nodular area noted in the mid trachea, likely mucus. LYMPH NODES: No mediastinal, hilar or  axillary lymphadenopathy. In particular, no right axillary adenopathy or abnormality. LUNGS AND PLEURA: Medial right apical nodule again noted measuring 10 mm on image 30 of series 5. Not significantly changed when compared to prior study. This measured only minimally smaller when compared to the study from 08/06/2015, when this measured maximally 9 mm. No significant change over nearly 7 years is compatible with a benign nodule. No new or enlarging pulmonary nodules. Linear scarring in the lung bases. No focal consolidation or pulmonary edema. No pleural effusion or pneumothorax. SOFT TISSUES/BONES: No acute abnormality of the bones or soft tissues. UPPER ABDOMEN: Limited images of the upper abdomen demonstrates no acute abnormality. VASCULATURE: Scattered aortic atherosclerosis. IMPRESSION: 1. Medial right apical nodule measuring 10 mm, not significantly changed dating back to 2017 compatible with a benign nodule. 2. No new or enlarging pulmonary nodules. 3. Aortic atherosclerosis. 4. Low density material layering dependently in the mid trachea, likely mucus/secretions. Electronically signed by: Franky Crease MD 04/16/2024 07:21 PM EST RP Workstation: [id number]      Signature  -   Brayton Lye M.D on 04/18/2024 at 4:56 PM   -  To page go to [email or web address]         "

## 2024-04-19 NOTE — Progress Notes (Signed)
 "                        PROGRESS NOTE        PATIENT DETAILS Name: Alicia Brennan Age: 74 y.o. Sex: female Date of Birth: [birth year] Admit Date: 03/31/2024 Admitting Physician Posey Maier, DO ERE:Qzops Tor Edra GRADE, MD  Brief Summary:  Patient is a 74 y.o.  female with history of dementia, seizure disorder-who was transferred from [facility] for LTM EEG.  Significant events: 12/28-01/5>> hospitalization at [facility] for altered mental status-fever at [facility] workup negative-transferred to [facility] for LTM EEG. 01/5>> admit to TRH at [facility].  Significant studies: 12/28>> CT head: No acute intracranial abnormality 12/29>> CXR: No PNA 12/31>> RUE Doppler: No DVT 01/01>> MRI brain: No acute intracranial abnormality 01/03>> CXR: Left lingula subsegmental atelectasis.  Significant microbiology data: 12/28>> COVID/influenza/RSV PCR: Negative 12/29>> blood culture: No growth 12/29>> urine culture: Multiple species 12/29>> respiratory virus panel: Negative 01/03>> blood culture: No growth 01/03>> COVID/influenza/RSV PCR: Negative 01/05>> respiratory virus panel: Negative 01/05>> CSF meningitis panel: Negative 01/05>> CSF culture: Pending  Procedures: 1/5>> fluoroscopy-guided LP  Consults: Neurology Palliative  Subjective:   No significant events overnight, she remains with poor appetite, pocketing her food this morning as well, despite assistance from nursing Juliane at bedside  Objective: Vitals: Blood pressure 124/60, pulse 72, temperature 99 F (37.2 C), temperature source Axillary, resp. rate 17, height 5' 4 (1.626 m), weight 67.5 kg, SpO2 96%.   Exam:  In bed, awake, frail, deconditioned Good air entry, no rales or rhonchi Abdomen soft  Lower extremity with no edema     Assessment/Plan:   Fever Had episodes of fever at [facility] on 1/3-1/5-none since then. Has had extensive workup-see above-no source apparent Although UA suggestive of UTI-urine cultures  nondiagnostic Suspicion for aspiration pneumonitis-in the setting of AMS-completed a course of Unasyn  on 1/8.   Intermittent right arm swelling.  - This usually happens due to blood pressure cuff being tight in her right arm, previously we have had right upper quadrant ultrasound to rule out DVT in December which was negative, repeat ultrasound on 04/15/2024 negative for DVT DVT.  Nursing staff requested to keep the cuff off.    Acute metabolic encephalopathy Thought to be secondary to infectious disease in the setting of fever-but concerned that patient could be having nonconvulsive seizures -LTM EEG negative for seizures. Overall improved-has underlying dementia-maintain delirium precautions.  Continue Keppra /Vimpat /Depakote .  Lung nodules - Reviewing previous record it does appear she had enlarging lung nodules, CT chest was obtained with showing stable lung nodules  - Evidence of secretions in mid trachea, will start on Mucinex and consult RT for chest PT  Seizure disorder See above  Mood disorder Zoloft   GERD PPI  HTN BP stable  Continue amlodipine . Will add low-dose Coreg  for better control  Hypokalemia Repleted.  Hypernatremia Likely secondary to poor oral intake Encourage oral intake Sodium has now stabilized-repeat electrolytes periodically-no longer on half-normal NS.  Dementia Delirium precautions  Palliative care Long discussion with daughter on 1/7-understands poor overall prognosis-we discussed how tube feeds really do not improve quality of life and longevity.  Subsequently evaluated by palliative care on 1/8-DNR-no tube feeds-SNF with palliative follow-up planned.   Pressure Ulcer: Agree with assessment as outlined below. Wound 03/24/24 1721 Pressure Injury Buttocks Medial Stage 2 -  Partial thickness loss of dermis presenting as a shallow open injury with a red, pink wound bed without slough. (Active)   Code status:  Code Status: Limited: Do not  attempt resuscitation (DNR) -DNR-LIMITED -Do Not Intubate/DNI    DVT Prophylaxis: enoxaparin  (LOVENOX ) injection 40 mg Start: 04/01/24 1000 SCDs Start: 04/01/24 0023   Family Communication:  discussed with daughter at bedside 1/23  Disposition Plan: Status is: Inpatient Remains inpatient appropriate because: Severity of illness   Planned Discharge Destination:Skilled nursing facility   Diet: Diet Order             Diet regular Room service appropriate? Yes with Assist; Fluid consistency: Thin  Diet effective now                     Data Review:   Patient Lines/Drains/Airways Status     Active Line/Drains/Airways     Name Placement date Placement time Site Days   Peripheral IV 04/17/24 22 G 1.75 Anterior;Left;Proximal Forearm 04/17/24  2249  Forearm  2   Ureteral Drain/Stent Left ureter 6 Fr. 02/13/24  1247  Left ureter  66   External Urinary Catheter 04/13/24  0415  --  6   Wound 03/24/24 1721 Pressure Injury Buttocks Medial Stage 2 -  Partial thickness loss of dermis presenting as a shallow open injury with a red, pink wound bed without slough. 03/24/24  1721  Buttocks  26             Inpatient Medications  Scheduled Meds:  amLODipine   5 mg Oral Daily   carvedilol   3.125 mg Oral BID WC   divalproex   250 mg Oral Q8H   enoxaparin  (LOVENOX ) injection  40 mg Subcutaneous Q24H   feeding supplement  237 mL Oral BID BM   isosorbide  mononitrate  15 mg Oral QHS   lacosamide   50 mg Oral BID   levETIRAcetam   500 mg Oral BID   mouth rinse  15 mL Mouth Rinse 4 times per day   pantoprazole   40 mg Oral Daily   sertraline   25 mg Oral Daily   Continuous Infusions: PRN Meds:.acetaminophen  **OR** acetaminophen , hydrALAZINE , labetalol , ondansetron  **OR** ondansetron  (ZOFRAN ) IV, mouth rinse  DVT Prophylaxis  enoxaparin  (LOVENOX ) injection 40 mg Start: 04/01/24 1000 SCDs Start: 04/01/24 0023       Recent Labs  Lab 04/14/24 0239  WBC 7.2  HGB 11.3*  HCT  34.3*  PLT 326  MCV 92.0  MCH 30.3  MCHC 32.9  RDW 14.4  LYMPHSABS 2.1  MONOABS 0.8  EOSABS 0.1  BASOSABS 0.0    Recent Labs  Lab 04/14/24 0239 04/15/24 0555  NA 134*  --   K 4.6  --   CL 99  --   CO2 26  --   ANIONGAP 9  --   GLUCOSE 101*  --   BUN 17  --   CREATININE 0.55 0.65  MG 1.9  --   PHOS 2.7  --   CALCIUM 9.3  --       Recent Labs  Lab 04/14/24 0239  MG 1.9  CALCIUM 9.3    --------------------------------------------------------------------------------------------------------------- No results found for: CHOL, HDL, LDLCALC, LDLDIRECT, TRIG, CHOLHDL  No results found for: HGBA1C No results for input(s): TSH, T4TOTAL, FREET4, T3FREE, THYROIDAB in the last 72 hours. No results for input(s): VITAMINB12, FOLATE, FERRITIN, TIBC, IRON, RETICCTPCT in the last 72 hours. ------------------------------------------------------------------------------------------------------------------ Cardiac Enzymes No results for input(s): CKMB, TROPONINI, MYOGLOBIN in the last 168 hours.  Invalid input(s): CK  Micro Results No results found for this or any previous visit (from the past 240 hours).  Radiology Reports  No  results found.     Signature  -   Brayton Lye M.D on 04/19/2024 at 1:29 PM   -  To page go to [email or web address]         "

## 2024-04-19 NOTE — Plan of Care (Signed)
 Palliative:  Daughter mentioned yesterday in our meeting her desire to complete MOST form prior to dc but wanted to take it home to review prior to completing. Went by room x2 today - no family present. Call to daughter - no answer, voicemail left.   Will request PMT provider follow up in coming days to address MOST.  Tobey Jama Barnacle, DNP, AGNP-C Palliative Medicine Team Team Phone # [phone]  Pager # [phone]

## 2024-04-20 NOTE — TOC Progression Note (Signed)
 Transition of Care [facility]) - Progression Note    Patient Details  Name: Alicia Brennan MRN: [id number] Date of Birth: [birth year]  [facility] CM/SW Contact  [name], LCSW Phone Number: 04/20/2024, 11:29 AM  Clinical Narrative:    CSW continuing to await bed availability at [facility].    Expected Discharge Plan: Skilled Nursing Facility Barriers to Discharge: Insurance Authorization, SNF Pending bed offer, Continued Medical Work up               Expected Discharge Plan and Services In-house Referral: Clinical Social Work   Post Acute Care Choice: Skilled Nursing Facility Living arrangements for the past 2 months: Skilled Nursing Facility                                       Social Drivers of Health (SDOH) Interventions SDOH Screenings   Food Insecurity: Patient Unable To Answer (03/31/2024)  Recent Concern: Food Insecurity - Food Insecurity Present (01/31/2024)  Housing: Patient Unable To Answer (03/31/2024)  Transportation Needs: No Transportation Needs (01/31/2024)  Utilities: Not At Risk (01/31/2024)  Financial Resource Strain: Low Risk (11/16/2023)   Received from [facility]  Social Connections: Unknown (03/31/2024)  Recent Concern: Social Connections - Moderately Isolated (01/31/2024)  Tobacco Use: Low Risk (04/01/2024)    Readmission Risk Interventions     No data to display

## 2024-04-20 NOTE — Progress Notes (Signed)
 Pt pocketing food/meds, unable to follow commands to swallow foods. Scheduled carvedilol  crushed and put in applesauce, attempted to give to pt but she was unable to swallow med. MD notified.

## 2024-04-20 NOTE — Progress Notes (Signed)
 "                        PROGRESS NOTE        PATIENT DETAILS Name: Alicia Brennan Age: 74 y.o. Sex: female Date of Birth: [birth year] Admit Date: 03/31/2024 Admitting Physician Posey Maier, DO ERE:Qzops Tor Edra GRADE, MD  Brief Summary:  Patient is a 74 y.o.  female with history of dementia, seizure disorder-who was transferred from [facility] for LTM EEG.  Significant events: 12/28-01/5>> hospitalization at [facility] for altered mental status-fever at [facility] workup negative-transferred to [facility] for LTM EEG. 01/5>> admit to TRH at [facility].  Significant studies: 12/28>> CT head: No acute intracranial abnormality 12/29>> CXR: No PNA 12/31>> RUE Doppler: No DVT 01/01>> MRI brain: No acute intracranial abnormality 01/03>> CXR: Left lingula subsegmental atelectasis.  Significant microbiology data: 12/28>> COVID/influenza/RSV PCR: Negative 12/29>> blood culture: No growth 12/29>> urine culture: Multiple species 12/29>> respiratory virus panel: Negative 01/03>> blood culture: No growth 01/03>> COVID/influenza/RSV PCR: Negative 01/05>> respiratory virus panel: Negative 01/05>> CSF meningitis panel: Negative 01/05>> CSF culture: Pending  Procedures: 1/5>> fluoroscopy-guided LP  Consults: Neurology Palliative  Subjective:   Patient denies any complaints today, but she is poor historian, as discussed with staff she is with very poor appetite   \\  Objective: Vitals: Blood pressure 139/75, pulse 81, temperature 98.6 F (37 C), temperature source Oral, resp. rate 19, height 5' 4 (1.626 m), weight 67.5 kg, SpO2 96%.   Exam:  In bed, awake, frail, deconditioned Clear to auscultation Abdomen soft Lower extremity with no edema     Assessment/Plan:   Fever Had episodes of fever at [facility] on 1/3-1/5-none since then. Has had extensive workup-see above-no source apparent Although UA suggestive of UTI-urine cultures nondiagnostic Suspicion for aspiration pneumonitis-in  the setting of AMS-completed a course of Unasyn  on 1/8.   Intermittent right arm swelling.  - This usually happens due to blood pressure cuff being tight in her right arm, previously we have had right upper quadrant ultrasound to rule out DVT in December which was negative, repeat ultrasound on 04/15/2024 negative for DVT DVT.  Nursing staff requested to keep the cuff off.    Acute metabolic encephalopathy Thought to be secondary to infectious disease in the setting of fever-but concerned that patient could be having nonconvulsive seizures -LTM EEG negative for seizures. Overall improved-has underlying dementia-maintain delirium precautions.  Continue Keppra /Vimpat /Depakote .  Lung nodules - Reviewing previous record it does appear she had enlarging lung nodules, CT chest was obtained with showing stable lung nodules  - Evidence of secretions in mid trachea, will start on Mucinex and consult RT for chest PT  Seizure disorder See above  Mood disorder Zoloft   GERD PPI  HTN BP stable  Continue amlodipine . Will add low-dose Coreg  for better control  Hypokalemia Repleted.  Hypernatremia Likely secondary to poor oral intake Encourage oral intake Sodium has now stabilized-repeat electrolytes periodically-no longer on half-normal NS.  Dementia Delirium precautions  Palliative care Long discussion with daughter on 1/7-understands poor overall prognosis-we discussed how tube feeds really do not improve quality of life and longevity.  Subsequently evaluated by palliative care on 1/8-DNR-no tube feeds-SNF with palliative follow-up planned.   Pressure Ulcer: Agree with assessment as outlined below. Wound 03/24/24 1721 Pressure Injury Buttocks Medial Stage 2 -  Partial thickness loss of dermis presenting as a shallow open injury with a red, pink wound bed without slough. (Active)   Code status:   Code Status: Limited:  Do not attempt resuscitation (DNR) -DNR-LIMITED -Do Not  Intubate/DNI    DVT Prophylaxis: enoxaparin  (LOVENOX ) injection 40 mg Start: 04/01/24 1000 SCDs Start: 04/01/24 0023   Family Communication:  discussed with daughter at bedside 1/23  Disposition Plan: Status is: Inpatient Patient is cleared for discharge when bed is available   Planned Discharge Destination:Skilled nursing facility   Diet: Diet Order             Diet regular Room service appropriate? Yes with Assist; Fluid consistency: Thin  Diet effective now                     Data Review:   Patient Lines/Drains/Airways Status     Active Line/Drains/Airways     Name Placement date Placement time Site Days   Peripheral IV 04/17/24 22 G 1.75 Anterior;Left;Proximal Forearm 04/17/24  2249  Forearm  3   Ureteral Drain/Stent Left ureter 6 Fr. 02/13/24  1247  Left ureter  67   External Urinary Catheter 04/13/24  0415  --  7   Wound 03/24/24 1721 Pressure Injury Buttocks Medial Stage 2 -  Partial thickness loss of dermis presenting as a shallow open injury with a red, pink wound bed without slough. 03/24/24  1721  Buttocks  27             Inpatient Medications  Scheduled Meds:  amLODipine   5 mg Oral Daily   carvedilol   3.125 mg Oral BID WC   divalproex   250 mg Oral Q8H   enoxaparin  (LOVENOX ) injection  40 mg Subcutaneous Q24H   feeding supplement  237 mL Oral BID BM   isosorbide  mononitrate  15 mg Oral QHS   lacosamide   50 mg Oral BID   levETIRAcetam   500 mg Oral BID   mouth rinse  15 mL Mouth Rinse 4 times per day   pantoprazole   40 mg Oral Daily   sertraline   25 mg Oral Daily   Continuous Infusions: PRN Meds:.acetaminophen  **OR** acetaminophen , hydrALAZINE , labetalol , ondansetron  **OR** ondansetron  (ZOFRAN ) IV, mouth rinse  DVT Prophylaxis  enoxaparin  (LOVENOX ) injection 40 mg Start: 04/01/24 1000 SCDs Start: 04/01/24 0023       Recent Labs  Lab 04/14/24 0239  WBC 7.2  HGB 11.3*  HCT 34.3*  PLT 326  MCV 92.0  MCH 30.3  MCHC 32.9  RDW  14.4  LYMPHSABS 2.1  MONOABS 0.8  EOSABS 0.1  BASOSABS 0.0    Recent Labs  Lab 04/14/24 0239 04/15/24 0555  NA 134*  --   K 4.6  --   CL 99  --   CO2 26  --   ANIONGAP 9  --   GLUCOSE 101*  --   BUN 17  --   CREATININE 0.55 0.65  MG 1.9  --   PHOS 2.7  --   CALCIUM 9.3  --       Recent Labs  Lab 04/14/24 0239  MG 1.9  CALCIUM 9.3    --------------------------------------------------------------------------------------------------------------- No results found for: CHOL, HDL, LDLCALC, LDLDIRECT, TRIG, CHOLHDL  No results found for: HGBA1C No results for input(s): TSH, T4TOTAL, FREET4, T3FREE, THYROIDAB in the last 72 hours. No results for input(s): VITAMINB12, FOLATE, FERRITIN, TIBC, IRON, RETICCTPCT in the last 72 hours. ------------------------------------------------------------------------------------------------------------------ Cardiac Enzymes No results for input(s): CKMB, TROPONINI, MYOGLOBIN in the last 168 hours.  Invalid input(s): CK  Micro Results No results found for this or any previous visit (from the past 240 hours).  Radiology Reports  No results  found.     Signature  -   Brayton Lye M.D on 04/20/2024 at 2:39 PM   -  To page go to [email or web address]         "

## 2024-04-21 NOTE — TOC Progression Note (Signed)
 Transition of Care [facility]) - Progression Note    Patient Details  Name: Jaime Dome MRN: [id number] Date of Birth: 01/05/1951  Transition of Care [facility]) CM/SW Contact  [name], LCSW Phone Number: 04/21/2024, 8:35 AM  Clinical Narrative:    [name] still awaiting their LTC bed to open.     Expected Discharge Plan: Skilled Nursing Facility Barriers to Discharge: Insurance Authorization, SNF Pending bed offer, Continued Medical Work up               Expected Discharge Plan and Services In-house Referral: Clinical Social Work   Post Acute Care Choice: Skilled Nursing Facility Living arrangements for the past 2 months: Skilled Nursing Facility                                       Social Drivers of Health (SDOH) Interventions SDOH Screenings   Food Insecurity: Patient Unable To Answer (03/31/2024)  Recent Concern: Food Insecurity - Food Insecurity Present (01/31/2024)  Housing: Patient Unable To Answer (03/31/2024)  Transportation Needs: No Transportation Needs (01/31/2024)  Utilities: Not At Risk (01/31/2024)  Financial Resource Strain: Low Risk (11/16/2023)   Received from [facility]  Social Connections: Unknown (03/31/2024)  Recent Concern: Social Connections - Moderately Isolated (01/31/2024)  Tobacco Use: Low Risk (04/01/2024)    Readmission Risk Interventions     No data to display

## 2024-04-21 NOTE — Progress Notes (Signed)
 "                        PROGRESS NOTE        PATIENT DETAILS Name: Alicia Brennan Age: 74 y.o. Sex: female Date of Birth: 1950-05-05 Admit Date: 03/31/2024 Admitting Physician Posey Maier, DO ERE:Qzops Tor Edra GRADE, MD  Brief Summary:  Patient is a 74 y.o.  female with history of dementia, seizure disorder-who was transferred from [facility] for LTM EEG.  Significant events: 12/28-01/5>> hospitalization at [facility] for altered mental status-fever at [facility] workup negative-transferred to [facility] for LTM EEG. 01/5>> admit to TRH at [facility].  Significant studies: 12/28>> CT head: No acute intracranial abnormality 12/29>> CXR: No PNA 12/31>> RUE Doppler: No DVT 01/01>> MRI brain: No acute intracranial abnormality 01/03>> CXR: Left lingula subsegmental atelectasis.  Significant microbiology data: 12/28>> COVID/influenza/RSV PCR: Negative 12/29>> blood culture: No growth 12/29>> urine culture: Multiple species 12/29>> respiratory virus panel: Negative 01/03>> blood culture: No growth 01/03>> COVID/influenza/RSV PCR: Negative 01/05>> respiratory virus panel: Negative 01/05>> CSF meningitis panel: Negative 01/05>> CSF culture: Pending  Procedures: 1/5>> fluoroscopy-guided LP  Consults: Neurology Palliative  Subjective:   She remains with poor appetite, has been pocketing her food, she is unable to provide any complaints   Objective: Vitals: Blood pressure 111/66, pulse 91, temperature 99.2 F (37.3 C), temperature source Axillary, resp. rate 17, height 5' 4 (1.626 m), weight 67.5 kg, SpO2 97%.   Exam:  Extremely frail, in bed, in no apparent distress Good air entry Abdomen soft Lower extremity with no edema     Assessment/Plan:   Fever Had episodes of fever at [facility] on 1/3-1/5-none since then. Has had extensive workup-see above-no source apparent Although UA suggestive of UTI-urine cultures nondiagnostic Suspicion for aspiration pneumonitis-in the setting  of AMS-completed a course of Unasyn  on 1/8.   Intermittent right arm swelling.  - This usually happens due to blood pressure cuff being tight in her right arm, previously we have had right upper quadrant ultrasound to rule out DVT in December which was negative, repeat ultrasound on 04/15/2024 negative for DVT DVT.  Nursing staff requested to keep the cuff off.    Acute metabolic encephalopathy Thought to be secondary to infectious disease in the setting of fever-but concerned that patient could be having nonconvulsive seizures -LTM EEG negative for seizures. Overall improved-has underlying dementia-maintain delirium precautions.  Continue Keppra /Vimpat /Depakote .  Lung nodules - Reviewing previous record it does appear she had enlarging lung nodules, CT chest was obtained with showing stable lung nodules  - Evidence of secretions in mid trachea, will start on Mucinex and consult RT for chest PT  Seizure disorder See above  Mood disorder Zoloft   GERD PPI  HTN BP stable  Continue amlodipine . Will add low-dose Coreg  for better control  Hypokalemia Repleted.  Hypernatremia Likely secondary to poor oral intake Encourage oral intake Sodium has now stabilized-repeat electrolytes periodically-no longer on half-normal NS.  Dementia Delirium precautions  Palliative care Long discussion with daughter on 1/7-understands poor overall prognosis-we discussed how tube feeds really do not improve quality of life and longevity.  Subsequently evaluated by palliative care on 1/8-DNR-no tube feeds-SNF with palliative follow-up planned.   Pressure Ulcer: Agree with assessment as outlined below. Wound 03/24/24 1721 Pressure Injury Buttocks Medial Stage 2 -  Partial thickness loss of dermis presenting as a shallow open injury with a red, pink wound bed without slough. (Active)   Code status:   Code Status: Limited: Do not  attempt resuscitation (DNR) -DNR-LIMITED -Do Not Intubate/DNI    DVT  Prophylaxis: enoxaparin  (LOVENOX ) injection 40 mg Start: 04/01/24 1000 SCDs Start: 04/01/24 0023   Family Communication:  discussed with daughter at bedside 1/23  Disposition Plan: Status is: Inpatient Patient is cleared for discharge when bed is available   Planned Discharge Destination:Skilled nursing facility   Diet: Diet Order             Diet regular Room service appropriate? Yes with Assist; Fluid consistency: Thin  Diet effective now                     Data Review:   Patient Lines/Drains/Airways Status     Active Line/Drains/Airways     Name Placement date Placement time Site Days   Peripheral IV 04/17/24 22 G 1.75 Anterior;Left;Proximal Forearm 04/17/24  2249  Forearm  4   Ureteral Drain/Stent Left ureter 6 Fr. 02/13/24  1247  Left ureter  68   External Urinary Catheter 04/13/24  0415  --  8   Wound 03/24/24 1721 Pressure Injury Buttocks Medial Stage 2 -  Partial thickness loss of dermis presenting as a shallow open injury with a red, pink wound bed without slough. 03/24/24  1721  Buttocks  28             Inpatient Medications  Scheduled Meds:  amLODipine   5 mg Oral Daily   carvedilol   3.125 mg Oral BID WC   divalproex   250 mg Oral Q8H   enoxaparin  (LOVENOX ) injection  40 mg Subcutaneous Q24H   feeding supplement  237 mL Oral BID BM   isosorbide  mononitrate  15 mg Oral QHS   lacosamide   50 mg Oral BID   levETIRAcetam   500 mg Oral BID   mouth rinse  15 mL Mouth Rinse 4 times per day   pantoprazole   40 mg Oral Daily   sertraline   25 mg Oral Daily   Continuous Infusions: PRN Meds:.acetaminophen  **OR** acetaminophen , hydrALAZINE , labetalol , ondansetron  **OR** ondansetron  (ZOFRAN ) IV, mouth rinse  DVT Prophylaxis  enoxaparin  (LOVENOX ) injection 40 mg Start: 04/01/24 1000 SCDs Start: 04/01/24 0023       No results for input(s): WBC, HGB, HCT, PLT, MCV, MCH, MCHC, RDW, LYMPHSABS, MONOABS, EOSABS, BASOSABS, BANDABS  in the last 168 hours.  Invalid input(s): NEUTRABS, BANDSABD   Recent Labs  Lab 04/15/24 0555  CREATININE 0.65      No results for input(s): CRP, DDIMER, PROCALCITON, LATICACIDVEN, INR, TSH, CORTISOL, HGBA1C, AMMONIA, BNP, MG, CALCIUM in the last 168 hours.  Invalid input(s): PHOSPHOROUS   --------------------------------------------------------------------------------------------------------------- No results found for: CHOL, HDL, LDLCALC, LDLDIRECT, TRIG, CHOLHDL  No results found for: HGBA1C No results for input(s): TSH, T4TOTAL, FREET4, T3FREE, THYROIDAB in the last 72 hours. No results for input(s): VITAMINB12, FOLATE, FERRITIN, TIBC, IRON, RETICCTPCT in the last 72 hours. ------------------------------------------------------------------------------------------------------------------ Cardiac Enzymes No results for input(s): CKMB, TROPONINI, MYOGLOBIN in the last 168 hours.  Invalid input(s): CK  Micro Results No results found for this or any previous visit (from the past 240 hours).  Radiology Reports  No results found.     Signature  -   Brayton Lye M.D on 04/21/2024 at 1:44 PM   -  To page go to [email or web address]         "

## 2024-04-22 LAB — CBC
HCT: 33.4 % — ABNORMAL LOW (ref 36.0–46.0)
Hemoglobin: 10.9 g/dL — ABNORMAL LOW (ref 12.0–15.0)
MCH: 30.4 pg (ref 26.0–34.0)
MCHC: 32.6 g/dL (ref 30.0–36.0)
MCV: 93 fL (ref 80.0–100.0)
Platelets: 283 10*3/uL (ref 150–400)
RBC: 3.59 MIL/uL — ABNORMAL LOW (ref 3.87–5.11)
RDW: 15.2 % (ref 11.5–15.5)
WBC: 7.8 10*3/uL (ref 4.0–10.5)
nRBC: 0 % (ref 0.0–0.2)

## 2024-04-22 LAB — BASIC METABOLIC PANEL WITH GFR
Anion gap: 9 (ref 5–15)
BUN: 23 mg/dL (ref 8–23)
CO2: 26 mmol/L (ref 22–32)
Calcium: 9.5 mg/dL (ref 8.9–10.3)
Chloride: 104 mmol/L (ref 98–111)
Creatinine, Ser: 0.84 mg/dL (ref 0.44–1.00)
GFR, Estimated: >60 mL/min
Glucose, Bld: 88 mg/dL (ref 70–99)
Potassium: 4.4 mmol/L (ref 3.5–5.1)
Sodium: 140 mmol/L (ref 135–145)

## 2024-04-22 NOTE — Progress Notes (Signed)
 Patient was not agreeable to any PO intake- food/drink/ensure/medications- Provider aware- daughter bedside as witness

## 2024-04-22 NOTE — Progress Notes (Signed)
 Physical Therapy Treatment Patient Details Name: Alicia Brennan MRN: [id number] DOB: Aug 09, 1950 Today's Date: 04/22/2024   History of Present Illness 74 yo F adm to 12/29 [facility] with AMS fevere concerns for aspiration pending d/c on 1/4 that was stopped due to seizure and fever. Family requested transfer to [facility] for seizures PMH pulmonary sarcoidosis, IBS, OA, HTN, dementia, R foot drop, chronic R side weakness and seizures.    PT Comments  Pt sleeping upon arrival and briefly wakes to tactile stimuli but quickly falls back asleep. Pt becomes more alert with removal of blankets and facilitation of transfer to sitting EOB. Pt tolerates sitting EOB ~15 minutes with constant maxA for trunk control while sitting EOB. Weight shifts were performed sitting EOB with maxA to facilitate the movement. PROM of UE and LE performed while sitting EOB. Pt with significant forward posture throughout and is unable to demonstrate cervical extension to look forward. Pt unable to communicate tolerance to activity, grimacing noted with PROM of B LE. Pt's family having ongoing GOC discussion. As long as aligned with GOC, pt would benefit from continued PT services focused on bed mobility, seated balance, and transfers as appropriate to progress functional mobility.     If plan is discharge home, recommend the following: Two people to help with walking and/or transfers;Two people to help with bathing/dressing/bathroom;Assistance with cooking/housework;Assistance with feeding;Direct supervision/assist for medications management;Direct supervision/assist for financial management;Assist for transportation;Help with stairs or ramp for entrance;Supervision due to cognitive status   Can travel by private vehicle     No (Cognition, level of assist required)  Equipment Recommendations  Wheelchair (measurements PT);Wheelchair cushion (measurements PT);Hospital bed;Hoyer lift    Recommendations for Other Services       Precautions  / Restrictions Precautions Precautions: Fall Recall of Precautions/Restrictions: Impaired Restrictions Weight Bearing Restrictions Per Provider Order: No     Mobility  Bed Mobility Overal bed mobility: Needs Assistance Bed Mobility: Supine to Sit, Sit to Supine     Supine to sit: Total assist Sit to supine: Total assist   General bed mobility comments: Pt does not initiate movement of B LE or UE during transfer. Grimaces with movement. Pt pivoted to EOB using bed pads, knee blocking required.    Transfers                   General transfer comment: STS not attempted due to limited command following and lack of muscle activation noted.    Ambulation/Gait                   Stairs             Wheelchair Mobility     Tilt Bed    Modified Rankin (Stroke Patients Only)       Balance Overall balance assessment: Needs assistance Sitting-balance support: Feet supported, No upper extremity supported Sitting balance-Leahy Scale: Poor Sitting balance - Comments: MaxA for trunk support required throughout. Pt unable to facilitate weight shift to demonstrate trunk control. Pt with difficulty looking up, forward head and shoulder posture throughout. Does not use B UE to support self, does not reposition B LE on floor without maxA to improve sitting balance. Postural control: Posterior lean     Standing balance comment: Standing not assessed this session for patient safety due to current deficits.                            Communication Communication Communication: Impaired  Factors Affecting Communication: Difficulty expressing self;Reduced clarity of speech  Cognition Arousal: Lethargic Behavior During Therapy: Flat affect   PT - Cognitive impairments: History of cognitive impairments, Sequencing, Difficult to assess, Safety/Judgement, Awareness, Orientation, Attention, Initiation Difficult to assess due to: Impaired communication, Level of  arousal                     PT - Cognition Comments: h/o dementia, minimally expressive, does not respond verbally to questions. Following commands: Impaired Following commands impaired: Follows one step commands inconsistently    Cueing Cueing Techniques: Verbal cues, Gestural cues, Tactile cues, Visual cues  Exercises General Exercises - Upper Extremity Elbow Flexion: PROM, Both, 5 reps, Seated Elbow Extension: PROM, Both, 5 reps, Seated General Exercises - Lower Extremity Ankle Circles/Pumps: PROM, Both, 5 reps, Seated Long Arc Quad: PROM, Both, 5 reps, Seated (within limited range) Other Exercises Other Exercises: Lateral weight shifts 1x10 Other Exercises: Forward/backward weight shifts 1x10 Other Exercises: Supported pec stretch 2x30s    General Comments General comments (skin integrity, edema, etc.): VSS throughout. No new skin abnormalities noted.      Pertinent Vitals/Pain Pain Assessment Pain Assessment: PAINAD Breathing: normal Negative Vocalization: none Facial Expression: facial grimacing Body Language: tense, distressed pacing, fidgeting Consolability: no need to console PAINAD Score: 3    Home Living                          Prior Function            PT Goals (current goals can now be found in the care plan section) Acute Rehab PT Goals PT Goal Formulation: With family Progress towards PT goals: Not progressing toward goals - comment (family interested in comfort care per daughter - ongoing discussion)    Frequency    Min 2X/week      PT Plan      Co-evaluation              AM-PAC PT 6 Clicks Mobility   Outcome Measure  Help needed turning from your back to your side while in a flat bed without using bedrails?: Total Help needed moving from lying on your back to sitting on the side of a flat bed without using bedrails?: Total Help needed moving to and from a bed to a chair (including a wheelchair)?: Total Help  needed standing up from a chair using your arms (e.g., wheelchair or bedside chair)?: Total Help needed to walk in hospital room?: Total Help needed climbing 3-5 steps with a railing? : Total 6 Click Score: 6    End of Session   Activity Tolerance: Patient limited by fatigue;Patient limited by lethargy Patient left: in bed;with call bell/phone within reach;with bed alarm set Nurse Communication: Mobility status;Need for lift equipment PT Visit Diagnosis: Muscle weakness (generalized) (M62.81);Other abnormalities of gait and mobility (R26.89)     Time: 8593-8577 PT Time Calculation (min) (ACUTE ONLY): 16 min  Charges:    $Therapeutic Activity: 8-22 mins PT General Charges $$ ACUTE PT VISIT: 1 Visit                     Sabra Morel, PT, DPT  Acute Rehabilitation Services         Office: [phone]      Sabra MARLA Morel 04/22/2024, 3:25 PM

## 2024-04-22 NOTE — Plan of Care (Signed)
 Patient progressing towards goals of care.     Problem: Education: Goal: Knowledge of General Education information will improve Description: Including pain rating scale, medication(s)/side effects and non-pharmacologic comfort measures Outcome: Progressing   Problem: Health Behavior/Discharge Planning: Goal: Ability to manage health-related needs will improve Outcome: Progressing   Problem: Clinical Measurements: Goal: Ability to maintain clinical measurements within normal limits will improve Outcome: Progressing Goal: Will remain free from infection Outcome: Progressing Goal: Diagnostic test results will improve Outcome: Progressing Goal: Respiratory complications will improve Outcome: Progressing Goal: Cardiovascular complication will be avoided Outcome: Progressing   Problem: Activity: Goal: Risk for activity intolerance will decrease Outcome: Progressing   Problem: Nutrition: Goal: Adequate nutrition will be maintained Outcome: Progressing   Problem: Coping: Goal: Level of anxiety will decrease Outcome: Progressing   Problem: Elimination: Goal: Will not experience complications related to bowel motility Outcome: Progressing Goal: Will not experience complications related to urinary retention Outcome: Progressing   Problem: Pain Managment: Goal: General experience of comfort will improve and/or be controlled Outcome: Progressing   Problem: Safety: Goal: Ability to remain free from injury will improve Outcome: Progressing   Problem: Skin Integrity: Goal: Risk for impaired skin integrity will decrease Outcome: Progressing

## 2024-04-22 NOTE — Progress Notes (Signed)
 Patient is currently not agreeable to PO intake- food/meds/supplements/oral care  Provider aware

## 2024-04-22 NOTE — Progress Notes (Incomplete)
 Palliative:  HPI: 74 y.o. 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 Required intubation 8/12.   I met today with 3 daughters, sister, brother, and Inge Lecher NP PCCM. We had discussion regarding path forward for one way extubation. We reviewed poor prognosis and expectation that JD will not do well once extubated. Family reiterate goal that he not suffer. We reviewed option to have medication available if he is having distress vs having medication already onboard to prevent distress. Family would like to have medication onboard to minimize any suffering during transition off ventilator. They would like to move forward with extubation later today and will have the rest of the family come to visit prior to extubation.   I reviewed plans and symptom management recommendations with RN.   Update: I was present with family prior to additional support as well as throughout extubation. Family appropriately tearful. Assisted to ensure comfort. I left family to visit privately with JD after he is resting comfortably after extubation.   All questions/concerns addressed. Emotional support provided. Much time coordinating care with RN, PCCM, and family.   Exam: Sedated on vent. FiO2 50%. Breathing regular, unlabored on vent. No distress. Not following commands. Abd soft. Warm to touch.   Plan:  - DNR - Extubated to full comfort care - Anticipate hospital death  100 min  [name], NP Palliative Medicine Team Pager [phone] (Please see [email or web address] for schedule) Team Phone [phone]

## 2024-04-22 NOTE — Progress Notes (Signed)
 "                        PROGRESS NOTE        PATIENT DETAILS Name: Alicia Brennan Age: 74 y.o. Sex: female Date of Birth: [birth year] Admit Date: 03/31/2024 Admitting Physician Posey Maier, DO ERE:Qzops Tor Edra GRADE, MD  Brief Summary:  Patient is a 74 y.o.  female with history of dementia, seizure disorder-who was transferred from [facility] for LTM EEG.  Significant events: 12/28-01/5>> hospitalization at [facility] for altered mental status-fever at [facility] workup negative-transferred to [facility] for LTM EEG. 01/5>> admit to TRH at [facility].  Significant studies: 12/28>> CT head: No acute intracranial abnormality 12/29>> CXR: No PNA 12/31>> RUE Doppler: No DVT 01/01>> MRI brain: No acute intracranial abnormality 01/03>> CXR: Left lingula subsegmental atelectasis.  Significant microbiology data: 12/28>> COVID/influenza/RSV PCR: Negative 12/29>> blood culture: No growth 12/29>> urine culture: Multiple species 12/29>> respiratory virus panel: Negative 01/03>> blood culture: No growth 01/03>> COVID/influenza/RSV PCR: Negative 01/05>> respiratory virus panel: Negative 01/05>> CSF meningitis panel: Negative 01/05>> CSF culture: Pending  Procedures: 1/5>> fluoroscopy-guided LP  Consults: Neurology Palliative  Subjective:   Remains with poor appetite, poor oral intake, pocketing fluids and refusing to eat.     Objective: Vitals: Blood pressure 105/65, pulse 90, temperature 98.6 F (37 C), temperature source Axillary, resp. rate 18, height 5' 4 (1.626 m), weight 68.8 kg, SpO2 95%.   Exam:  Extremely frail, in bed, in no apparent distress, demented, does not answer questions or follow commands appropriately Good air entry Abdomen soft Lower extremity with no edema     Assessment/Plan:   Fever Had episodes of fever at [facility] on 1/3-1/5-none since then. Has had extensive workup-see above-no source apparent Although UA suggestive of UTI-urine cultures  nondiagnostic Suspicion for aspiration pneumonitis-in the setting of AMS-completed a course of Unasyn  on 1/8.   Intermittent right arm swelling.  - This usually happens due to blood pressure cuff being tight in her right arm, previously we have had right upper quadrant ultrasound to rule out DVT in December which was negative, repeat ultrasound on 04/15/2024 negative for DVT DVT.  Nursing staff requested to keep the cuff off.    Acute metabolic encephalopathy Underlying vascular dementia Thought to be secondary to infectious disease in the setting of fever-but concerned that patient could be having nonconvulsive seizures -LTM EEG negative for seizures. Overall improved-has underlying dementia-maintain delirium precautions.  Continue Keppra /Vimpat /Depakote . - Appears to be with significant underlying vascular dementia, very poor oral intake, refusing meds usually, and she pockets her food most of the time.  Failure to thrive Deconditioning Vascular dementia - Patient is very frail, weak, deconditioned with very poor oral intake, no acute reversible causes, she has been having progressive decline over few months-  .  Lung nodules - Reviewing previous record it does appear she had enlarging lung nodules, CT chest was obtained with showing stable lung nodules  - Evidence of secretions in mid trachea, will start on Mucinex and consult RT for chest PT  Seizure disorder See above  Mood disorder Zoloft   GERD PPI  HTN BP stable  Continue amlodipine . Will add low-dose Coreg  for better control  Hypokalemia Repleted.  Hypernatremia Likely secondary to poor oral intake Encourage oral intake Sodium has now stabilized-repeat electrolytes periodically-no longer on half-normal NS.  Palliative care Long discussion with daughter on 1/7-understands poor overall prognosis-we discussed how tube feeds really do not improve quality of life and longevity.  Subsequently evaluated by palliative  care on 1/8-DNR-no tube feeds-SNF with palliative follow-up planned.   Pressure Ulcer: Agree with assessment as outlined below. Wound 03/24/24 1721 Pressure Injury Buttocks Medial Stage 2 -  Partial thickness loss of dermis presenting as a shallow open injury with a red, pink wound bed without slough. (Active)   Code status:   Code Status: Limited: Do not attempt resuscitation (DNR) -DNR-LIMITED -Do Not Intubate/DNI    DVT Prophylaxis: enoxaparin  (LOVENOX ) injection 40 mg Start: 04/01/24 1000 SCDs Start: 04/01/24 0023   Family Communication:  discussed with daughter at bedside 1/23  Disposition Plan: Status is: Inpatient Patient is cleared for discharge when bed is available   Planned Discharge Destination:Skilled nursing facility   Diet: Diet Order             Diet regular Room service appropriate? Yes with Assist; Fluid consistency: Thin  Diet effective now                     Data Review:   Patient Lines/Drains/Airways Status     Active Line/Drains/Airways     Name Placement date Placement time Site Days   Peripheral IV 04/17/24 22 G 1.75 Anterior;Left;Proximal Forearm 04/17/24  2249  Forearm  5   Ureteral Drain/Stent Left ureter 6 Fr. 02/13/24  1247  Left ureter  69   External Urinary Catheter 04/13/24  0415  --  9   Wound 03/24/24 1721 Pressure Injury Buttocks Medial Stage 2 -  Partial thickness loss of dermis presenting as a shallow open injury with a red, pink wound bed without slough. 03/24/24  1721  Buttocks  29             Inpatient Medications  Scheduled Meds:  amLODipine   5 mg Oral Daily   carvedilol   3.125 mg Oral BID WC   divalproex   250 mg Oral Q8H   enoxaparin  (LOVENOX ) injection  40 mg Subcutaneous Q24H   feeding supplement  237 mL Oral BID BM   isosorbide  mononitrate  15 mg Oral QHS   lacosamide   50 mg Oral BID   levETIRAcetam   500 mg Oral BID   mouth rinse  15 mL Mouth Rinse 4 times per day   pantoprazole   40 mg Oral Daily    sertraline   25 mg Oral Daily   Continuous Infusions: PRN Meds:.acetaminophen  **OR** acetaminophen , hydrALAZINE , labetalol , ondansetron  **OR** ondansetron  (ZOFRAN ) IV, mouth rinse  DVT Prophylaxis  enoxaparin  (LOVENOX ) injection 40 mg Start: 04/01/24 1000 SCDs Start: 04/01/24 0023       Recent Labs  Lab 04/22/24 1352  WBC 7.8  HGB 10.9*  HCT 33.4*  PLT 283  MCV 93.0  MCH 30.4  MCHC 32.6  RDW 15.2     Recent Labs  Lab 04/22/24 1352  NA 140  K 4.4  CL 104  CO2 26  ANIONGAP 9  GLUCOSE 88  BUN 23  CREATININE 0.84  CALCIUM 9.5      Recent Labs  Lab 04/22/24 1352  CALCIUM 9.5     --------------------------------------------------------------------------------------------------------------- No results found for: CHOL, HDL, LDLCALC, LDLDIRECT, TRIG, CHOLHDL  No results found for: HGBA1C No results for input(s): TSH, T4TOTAL, FREET4, T3FREE, THYROIDAB in the last 72 hours. No results for input(s): VITAMINB12, FOLATE, FERRITIN, TIBC, IRON, RETICCTPCT in the last 72 hours. ------------------------------------------------------------------------------------------------------------------ Cardiac Enzymes No results for input(s): CKMB, TROPONINI, MYOGLOBIN in the last 168 hours.  Invalid input(s): CK  Micro Results No results found for this or any previous visit (  from the past 240 hours).  Radiology Reports  No results found.     Signature  -   Brayton Lye M.D on 04/22/2024 at 2:59 PM   -  To page go to [email or web address]         "

## 2024-04-22 NOTE — TOC Progression Note (Signed)
 Transition of Care [facility]) - Progression Note    Patient Details  Name: Alicia Brennan MRN: [id number] Date of Birth: [birth year]  [facility] CM/SW Contact  [name], LCSW Phone Number: 04/22/2024, 8:53 AM  Clinical Narrative:    Awaiting bed at [facility] for LTC. CSW checked again with [place], [place], [place], [place], [facility], [facility], [facility], and [facility] but no beds available.    Expected Discharge Plan: Skilled Nursing Facility Barriers to Discharge: Insurance Authorization, SNF Pending bed offer, Continued Medical Work up               Expected Discharge Plan and Services In-house Referral: Clinical Social Work   Post Acute Care Choice: Skilled Nursing Facility Living arrangements for the past 2 months: Skilled Nursing Facility                                       Social Drivers of Health (SDOH) Interventions SDOH Screenings   Food Insecurity: Patient Unable To Answer (03/31/2024)  Recent Concern: Food Insecurity - Food Insecurity Present (01/31/2024)  Housing: Patient Unable To Answer (03/31/2024)  Transportation Needs: No Transportation Needs (01/31/2024)  Utilities: Not At Risk (01/31/2024)  Financial Resource Strain: Low Risk (11/16/2023)   Received from [facility]  Social Connections: Unknown (03/31/2024)  Recent Concern: Social Connections - Moderately Isolated (01/31/2024)  Tobacco Use: Low Risk (04/01/2024)    Readmission Risk Interventions     No data to display

## 2024-04-22 NOTE — Plan of Care (Signed)
  Problem: Education: Goal: Knowledge of General Education information will improve Description: Including pain rating scale, medication(s)/side effects and non-pharmacologic comfort measures Outcome: Progressing   Problem: Clinical Measurements: Goal: Will remain free from infection Outcome: Progressing Goal: Respiratory complications will improve Outcome: Progressing   Problem: Activity: Goal: Risk for activity intolerance will decrease Outcome: Progressing   Problem: Nutrition: Goal: Adequate nutrition will be maintained Outcome: Progressing   Problem: Pain Managment: Goal: General experience of comfort will improve and/or be controlled Outcome: Progressing   Problem: Skin Integrity: Goal: Risk for impaired skin integrity will decrease Outcome: Progressing

## 2024-04-22 NOTE — Progress Notes (Signed)
 Patient still currently not agreeable to any PO intake or oral care- family bedside to witness- provider aware

## 2024-04-23 MED ORDER — ONDANSETRON HCL 4 MG PO TABS: 4.0000 mg | ORAL_TABLET | Freq: Four times a day (QID) | ORAL | Status: AC | PRN

## 2024-04-23 MED ORDER — AMLODIPINE BESYLATE 5 MG PO TABS: 5.0000 mg | ORAL_TABLET | Freq: Every day | ORAL | Status: AC

## 2024-04-23 MED ORDER — ISOSORBIDE MONONITRATE ER 30 MG PO TB24: 15.0000 mg | ORAL_TABLET | Freq: Every day | ORAL | Status: AC

## 2024-04-23 MED ORDER — ENSURE PLUS HIGH PROTEIN PO LIQD: 237.0000 mL | Freq: Two times a day (BID) | ORAL | Status: AC

## 2024-04-23 MED ORDER — CARVEDILOL 3.125 MG PO TABS: 3.1250 mg | ORAL_TABLET | Freq: Two times a day (BID) | ORAL | Status: AC

## 2024-04-23 MED ORDER — LACOSAMIDE 50 MG PO TABS: 50.0000 mg | ORAL_TABLET | Freq: Two times a day (BID) | ORAL | 0 refills | Status: AC

## 2024-04-23 MED ORDER — ACETAMINOPHEN 325 MG PO TABS: 650.0000 mg | ORAL_TABLET | Freq: Four times a day (QID) | ORAL | Status: AC | PRN

## 2024-04-23 NOTE — Plan of Care (Signed)

## 2024-04-23 NOTE — Progress Notes (Signed)
" °   04/23/24 0800  Vitals  Temp 98.2 F (36.8 C)  Temp Source Axillary  Resp 20  Level of Consciousness  Level of Consciousness Responds to Pain  MEWS COLOR  MEWS Score Color Yellow  Oxygen Therapy  O2 Device Room Air  Pain Assessment  Pain Scale Faces  Faces Pain Scale 0  PCA/Epidural/Spinal Assessment  Respiratory Pattern Regular;Unlabored;Symmetrical  Glasgow Coma Scale  Eye Opening 2  Best Verbal Response (NON-intubated) 1  Best Motor Response 5  Glasgow Coma Scale Score 8  MEWS Score  MEWS Temp 0  MEWS Systolic 1  MEWS Pulse 0  MEWS RR 0  MEWS LOC 2  MEWS Score 3   Provider awre  "

## 2024-04-23 NOTE — Plan of Care (Signed)
°  Patient is progressing towards goals of care    Problem: Education: Goal: Knowledge of General Education information will improve Description: Including pain rating scale, medication(s)/side effects and non-pharmacologic comfort measures Outcome: Progressing   Problem: Health Behavior/Discharge Planning: Goal: Ability to manage health-related needs will improve Outcome: Progressing   Problem: Clinical Measurements: Goal: Ability to maintain clinical measurements within normal limits will improve Outcome: Progressing Goal: Will remain free from infection Outcome: Progressing Goal: Diagnostic test results will improve Outcome: Progressing Goal: Respiratory complications will improve Outcome: Progressing Goal: Cardiovascular complication will be avoided Outcome: Progressing   Problem: Activity: Goal: Risk for activity intolerance will decrease Outcome: Progressing   Problem: Nutrition: Goal: Adequate nutrition will be maintained Outcome: Progressing   Problem: Coping: Goal: Level of anxiety will decrease Outcome: Progressing   Problem: Elimination: Goal: Will not experience complications related to bowel motility Outcome: Progressing Goal: Will not experience complications related to urinary retention Outcome: Progressing   Problem: Pain Managment: Goal: General experience of comfort will improve and/or be controlled Outcome: Progressing   Problem: Safety: Goal: Ability to remain free from injury will improve Outcome: Progressing   Problem: Skin Integrity: Goal: Risk for impaired skin integrity will decrease Outcome: Progressing

## 2024-04-23 NOTE — Plan of Care (Signed)
" °  Problem: Education: Goal: Knowledge of General Education information will improve Description: Including pain rating scale, medication(s)/side effects and non-pharmacologic comfort measures Outcome: Progressing   Problem: Health Behavior/Discharge Planning: Goal: Ability to manage health-related needs will improve Outcome: Progressing   Problem: Clinical Measurements: Goal: Ability to maintain clinical measurements within normal limits will improve Outcome: Progressing Goal: Will remain free from infection Outcome: Progressing Goal: Diagnostic test results will improve Outcome: Progressing Goal: Respiratory complications will improve Outcome: Progressing   Problem: Activity: Goal: Risk for activity intolerance will decrease Outcome: Progressing   Problem: Nutrition: Goal: Adequate nutrition will be maintained Outcome: Progressing   Problem: Elimination: Goal: Will not experience complications related to bowel motility Outcome: Progressing Goal: Will not experience complications related to urinary retention Outcome: Progressing   Problem: Safety: Goal: Ability to remain free from injury will improve Outcome: Progressing   "

## 2024-04-23 NOTE — TOC Transition Note (Signed)
 Transition of Care [facility]) - Discharge Note   Patient Details  Name: Alicia Brennan MRN: [id number] Date of Birth: May 12, 1950  Transition of Care [facility]) CM/SW Contact:  [name], LCSW Phone Number: 04/23/2024, 1:03 PM   Clinical Narrative:    Patient will DC to: [facility] Anticipated DC date: 04/23/24 Family notified: Daughter, Engineer[employer] by: [place]   Per MD patient ready for DC to [place]. RN to call report prior to discharge [phone] room 203A). RN, patient, patient's family, Hospice, and facility notified of DC. Discharge Summary and FL2 sent to facility. DC packet on chart including signed script and DNR. Ambulance transport requested for patient.   CSW will sign off for now as social work intervention is no longer needed. Please consult us  again if new needs arise.     Final next level of care: Skilled Nursing Facility Barriers to Discharge: Barriers Resolved   Patient Goals and CMS Choice Patient states their goals for this hospitalization and ongoing recovery are:: Comfort CMS Medicare.gov Compare Post Acute Care list provided to:: Patient Represenative (must comment) Choice offered to / list presented to : Adult Children [place] ownership interest in [facility].provided to:: Adult Children    Discharge Placement   Existing PASRR number confirmed : 04/23/24          Patient [facility] bed at: [facility] Patient to be transferred to facility by: [facility] Name of family member notified: Daughter Patient and family notified of of transfer: 04/23/24  Discharge Plan and Services Additional resources added to the After Visit Summary for   In-house Referral: Clinical Social Work   Post Acute Care Choice: Skilled Nursing Facility                               Social Drivers of Health (SDOH) Interventions SDOH Screenings   Food Insecurity: Patient Unable To Answer (03/31/2024)  Recent Concern: Food Insecurity - Food  Insecurity Present (01/31/2024)  Housing: Patient Unable To Answer (03/31/2024)  Transportation Needs: No Transportation Needs (01/31/2024)  Utilities: Not At Risk (01/31/2024)  Financial Resource Strain: Low Risk (11/16/2023)   Received from [facility]  Social Connections: Unknown (03/31/2024)  Recent Concern: Social Connections - Moderately Isolated (01/31/2024)  Tobacco Use: Low Risk (04/01/2024)     Readmission Risk Interventions     No data to display

## 2024-04-23 NOTE — Discharge Summary (Signed)
 "  PATIENT DETAILS Name: Alicia Brennan Age: 74 y.o. Sex: female Date of Birth: 01/29/1951 MRN: [id number]. Admitting Physician: Posey Maier, DO ERE:Qzops Tor Edra GRADE, MD  Admit Date: 03/31/2024 Discharge date: 04/23/2024  Recommendations for Outpatient Follow-up:  Follow up with PCP in 1-2 weeks Please obtain CMP/CBC in one week  Admitted From:  SNF  Disposition: Skilled nursing facility with Hospice   Discharge Condition: fair  CODE STATUS:   Code Status: Limited: Do not attempt resuscitation (DNR) -DNR-LIMITED -Do Not Intubate/DNI    Diet recommendation:  Diet Order             Diet general           Diet regular Room service appropriate? Yes with Assist; Fluid consistency: Thin  Diet effective now                    Brief Summary: Patient is a 74 y.o.  female with history of dementia, seizure disorder-who was transferred from [facility] for LTM EEG.   Significant events: 12/28-01/5>> hospitalization at [facility] for altered mental status-fever at [facility] workup negative-transferred to [facility] for LTM EEG. 01/5>> admit to TRH at [facility].   Significant studies: 12/28>> CT head: No acute intracranial abnormality 12/29>> CXR: No PNA 12/31>> RUE Doppler: No DVT 01/01>> MRI brain: No acute intracranial abnormality 01/03>> CXR: Left lingula subsegmental atelectasis. 01/20>> B/L upper extremity Doppler: No DVT 01/21>> CT chest: No acute findings.   Significant microbiology data: 12/28>> COVID/influenza/RSV PCR: Negative 12/29>> blood culture: No growth 12/29>> urine culture: Multiple species 12/29>> respiratory virus panel: Negative 01/03>> blood culture: No growth 01/03>> COVID/influenza/RSV PCR: Negative 01/05>> respiratory virus panel: Negative 01/05>> CSF meningitis panel: Negative 01/05>> CSF culture: Negative   Procedures: 1/5>> fluoroscopy-guided LP   Consults: Neurology Palliative  Brief Hospital Course: Fever Had episodes of fever at [facility]  on 1/3-1/5-none since then. Has had extensive workup-see above-no source apparent Although UA suggestive of UTI-urine cultures nondiagnostic Suspicion for aspiration pneumonitis-in the setting of AMS-completed a course of Unasyn  on 1/8.    Intermittent right arm swelling.  This usually happens due to blood pressure cuff being tight in her right arm, previously we have had right upper quadrant ultrasound to rule out DVT in December which was negative, repeat ultrasound on 04/15/2024 negative for DVT.  Nursing staff requested to keep the cuff off.     Acute metabolic encephalopathy Underlying vascular dementia Thought to be secondary to infectious disease in the setting of fever-but concerned that patient could be having nonconvulsive seizures LTM EEG negative for seizures. Overall improved-has underlying dementia-maintain delirium precautions.  Continue Keppra /Vimpat /Depakote .   Failure to thrive Deconditioning Vascular dementia Patient is very frail, weak, deconditioned with very poor oral intake, no acute reversible causes, she has been having progressive decline over few months-see below regarding palliative care. Oral intake continues to be erratic   Seizure disorder See above   Mood disorder Zoloft    GERD PPI   HTN BP stable  Continue amlodipine , Coreg  and Imdur .   Hypokalemia Repleted.   Hypernatremia Likely secondary to poor oral intake Encourage oral intake Sodium has now stabilized-repeat electrolytes periodically-no longer on half-normal NS.  Medial right apical lung nodule 10 mm Likely benign-per radiology-unchanged from 2017.   Palliative care Long discussion with daughter on 1/7-understands poor overall prognosis-we discussed how tube feeds really do not improve quality of life and longevity.  Subsequently evaluated by palliative care on 1/8-DNR-no tube feeds-SNF with palliative follow-up planned.   Pressure  Ulcer: Agree with assessment as outlined  below. Wound 03/24/24 1721 Pressure Injury Buttocks Medial Stage 2 -  Partial thickness loss of dermis presenting as a shallow open injury with a red, pink wound bed without slough. (Active)    Discharge Diagnoses:  Active Problems:   Breakthrough seizure [facility])   Discharge Instructions:  Activity:  As tolerated with Full fall precautions use walker/cane & assistance as needed   Discharge Instructions     Diet general   Complete by: As directed    Discharge instructions   Complete by: As directed    Follow with Primary MD  [name], Edra GRADE, MD in 1-2 weeks  Please get a complete blood count and chemistry panel checked by your Primary MD at your next visit, and again as instructed by your Primary MD.  Get Medicines reviewed and adjusted: Please take all your medications with you for your next visit with your Primary MD  Laboratory/radiological data: Please request your Primary MD to go over all hospital tests and procedure/radiological results at the follow up, please ask your Primary MD to get all Hospital records sent to his/her office.  In some cases, they will be blood work, cultures and biopsy results pending at the time of your discharge. Please request that your primary care M.D. follows up on these results.  Also Note the following: If you experience worsening of your admission symptoms, develop shortness of breath, life threatening emergency, suicidal or homicidal thoughts you must seek medical attention immediately by calling 911 or calling your MD immediately  if symptoms less severe.  You must read complete instructions/literature along with all the possible adverse reactions/side effects for all the Medicines you take and that have been prescribed to you. Take any new Medicines after you have completely understood and accpet all the possible adverse reactions/side effects.   Do not drive when taking Pain medications or sleeping medications  (Benzodaizepines)  Do not take more than prescribed Pain, Sleep and Anxiety Medications. It is not advisable to combine anxiety,sleep and pain medications without talking with your primary care practitioner  Special Instructions: If you have smoked or chewed Tobacco  in the last 2 yrs please stop smoking, stop any regular Alcohol   and or any Recreational drug use.  Wear Seat belts while driving.  Please note: You were cared for by a hospitalist during your hospital stay. Once you are discharged, your primary care physician will handle any further medical issues. Please note that NO REFILLS for any discharge medications will be authorized once you are discharged, as it is imperative that you return to your primary care physician (or establish a relationship with a primary care physician if you do not have one) for your post hospital discharge needs so that they can reassess your need for medications and monitor your lab values.   Increase activity slowly   Complete by: As directed    No wound care   Complete by: As directed       Allergies as of 04/23/2024   No Known Allergies      Medication List     STOP taking these medications    acyclovir  690 mg in dextrose  5 % 100 mL   ampicillin  2 g in sodium chloride  0.9 % 100 mL   cefTRIAXone  2 g in sodium chloride  0.9 % 100 mL   furosemide  40 MG tablet Commonly known as: LASIX    hydrALAZINE  25 MG tablet Commonly known as: APRESOLINE    Hydrocortisone (Perianal) 1 %  Crea   lamoTRIgine  25 MG tablet Commonly known as: LAMICTAL    Nayzilam  5 MG/0.1ML Soln Generic drug: Midazolam    sodium chloride  0.9 % infusion   telmisartan 80 MG tablet Commonly known as: MICARDIS   valproate 250 mg in dextrose  5 % 50 mL   vancomycin  500 MG/100ML IVPB Commonly known as: VANCOREADY       TAKE these medications    acetaminophen  325 MG tablet Commonly known as: TYLENOL  Take 2 tablets (650 mg total) by mouth every 6 (six) hours as needed  for mild pain (pain score 1-3) or fever (or Fever >/= 101).   amLODipine  5 MG tablet Commonly known as: NORVASC  Take 1 tablet (5 mg total) by mouth daily. Start taking on: April 24, 2024   carvedilol  3.125 MG tablet Commonly known as: COREG  Take 1 tablet (3.125 mg total) by mouth 2 (two) times daily with a meal.   divalproex  250 MG DR tablet Commonly known as: Depakote  Take 1 tablet (250 mg total) by mouth 3 (three) times daily.   feeding supplement Liqd Take 237 mLs by mouth 2 (two) times daily between meals.   isosorbide  mononitrate 30 MG 24 hr tablet Commonly known as: IMDUR  Take 0.5 tablets (15 mg total) by mouth at bedtime.   lacosamide  50 MG Tabs tablet Commonly known as: VIMPAT  Take 1 tablet (50 mg total) by mouth 2 (two) times daily.   levETIRAcetam  500 MG tablet Commonly known as: KEPPRA  Take 1 tablet (500 mg total) by mouth 2 (two) times daily.   ondansetron  4 MG tablet Commonly known as: ZOFRAN  Take 1 tablet (4 mg total) by mouth every 6 (six) hours as needed for nausea.   pantoprazole  40 MG tablet Commonly known as: Protonix  Take 1 tablet (40 mg total) by mouth daily.   senna 8.6 MG Tabs tablet Commonly known as: SENOKOT Take 8.6 mg by mouth at bedtime.   sertraline  25 MG tablet Commonly known as: ZOLOFT  Take 25 mg by mouth daily.        Contact information for follow-up providers     [name], Edra GRADE, MD. Schedule an appointment as soon as possible for a visit in 1 week(s).   Specialty: Family Medicine Contact information: [address] [place] [address] [phone]              Contact information for after-discharge care     Destination     [place] [facility] [place], [place] .   Service: Skilled Nursing Contact information: [address] [place]  [address] [phone]                    Allergies[1]   Other Procedures/Studies: CT CHEST WO CONTRAST Result Date: 04/16/2024 EXAM: CT  CHEST WITHOUT CONTRAST 04/16/2024 07:11:53 PM TECHNIQUE: CT of the chest was performed without the administration of intravenous contrast. Multiplanar reformatted images are provided for review. Automated exposure control, iterative reconstruction, and/or weight based adjustment of the mA/kV was utilized to reduce the radiation dose to as low as reasonably achievable. COMPARISON: Prior study from 08/06/2015. CLINICAL HISTORY: Pulmonary nodule seen on CT cervical spine last year, as well evaluate for lymphadenopathy mainly in the right axilla given persistent swelling. FINDINGS: MEDIASTINUM: Heart and pericardium are unremarkable. Layering low-density nodular area noted in the mid trachea, likely mucus. LYMPH NODES: No mediastinal, hilar or axillary lymphadenopathy. In particular, no right axillary adenopathy or abnormality. LUNGS AND PLEURA: Medial right apical nodule again noted measuring 10 mm on image 30  of series 5. Not significantly changed when compared to prior study. This measured only minimally smaller when compared to the study from 08/06/2015, when this measured maximally 9 mm. No significant change over nearly 7 years is compatible with a benign nodule. No new or enlarging pulmonary nodules. Linear scarring in the lung bases. No focal consolidation or pulmonary edema. No pleural effusion or pneumothorax. SOFT TISSUES/BONES: No acute abnormality of the bones or soft tissues. UPPER ABDOMEN: Limited images of the upper abdomen demonstrates no acute abnormality. VASCULATURE: Scattered aortic atherosclerosis. IMPRESSION: 1. Medial right apical nodule measuring 10 mm, not significantly changed dating back to 2017 compatible with a benign nodule. 2. No new or enlarging pulmonary nodules. 3. Aortic atherosclerosis. 4. Low density material layering dependently in the mid trachea, likely mucus/secretions. Electronically signed by: Franky Crease MD 04/16/2024 07:21 PM EST RP Workstation: [id number]   VAS US  UPPER  EXTREMITY VENOUS DUPLEX Result Date: 04/15/2024 UPPER VENOUS STUDY  Patient Name:  Alicia Brennan  Date of Exam:   04/15/2024 Medical Rec #: [id number]       Accession #:    [id number] Date of Birth: 04-13-1950        Patient Gender: F Patient Age:   87 years Exam Location:  [facility] Procedure:      VAS US  UPPER EXTREMITY VENOUS DUPLEX Referring Phys: REDIA CLEAVER --------------------------------------------------------------------------------  Indications: Swelling Other Indications: History of essential hypertension. Risk Factors: Chronic right-sided weakness with associated right foot drop, seizures. Comparison Study: 03/26/24 - Right upper ext venous negative Performing Technologist: Ricka Sturdivant-Jones RDMS, RVT  Examination Guidelines: A complete evaluation includes B-mode imaging, spectral Doppler, color Doppler, and power Doppler as needed of all accessible portions of each vessel. Bilateral testing is considered an integral part of a complete examination. Limited examinations for reoccurring indications may be performed as noted.  Right Findings: +----------+------------+---------+-----------+----------+-------+ RIGHT     CompressiblePhasicitySpontaneousPropertiesSummary +----------+------------+---------+-----------+----------+-------+ IJV           Full       Yes       Yes                      +----------+------------+---------+-----------+----------+-------+ Subclavian    Full       Yes       Yes                      +----------+------------+---------+-----------+----------+-------+ Axillary      Full       Yes       Yes                      +----------+------------+---------+-----------+----------+-------+ Brachial      Full                                          +----------+------------+---------+-----------+----------+-------+ Radial        Full                                           +----------+------------+---------+-----------+----------+-------+ Ulnar         Full                                          +----------+------------+---------+-----------+----------+-------+  Cephalic      Full                                          +----------+------------+---------+-----------+----------+-------+ Basilic       Full                                          +----------+------------+---------+-----------+----------+-------+  Left Findings: +----------+------------+---------+-----------+----------+-------+ LEFT      CompressiblePhasicitySpontaneousPropertiesSummary +----------+------------+---------+-----------+----------+-------+ IJV           Full       Yes       Yes                      +----------+------------+---------+-----------+----------+-------+ Subclavian    Full       Yes       Yes                      +----------+------------+---------+-----------+----------+-------+ Axillary      Full       Yes       Yes                      +----------+------------+---------+-----------+----------+-------+ Brachial      Full                                          +----------+------------+---------+-----------+----------+-------+ Radial        Full                                          +----------+------------+---------+-----------+----------+-------+ Ulnar         Full                                          +----------+------------+---------+-----------+----------+-------+ Cephalic      Full                                          +----------+------------+---------+-----------+----------+-------+ Basilic       Full                                          +----------+------------+---------+-----------+----------+-------+  Summary: No evidence of deep vein or superficial vein thrombosis involving the right and left upper extremities.  *See table(s) above for measurements and observations.  Diagnosing  physician: Gaile New MD Electronically signed by Gaile New MD on 04/15/2024 at 12:44:42 PM.    Final    Overnight EEG with video Result Date: 04/02/2024 Shelton Arlin KIDD, MD     04/02/2024 11:36 AM Patient Name: Alicia Brennan MRN: [id number] Epilepsy Attending: Arlin KIDD Shelton Referring Physician/Provider: Khaliqdina, Salman, MD Duration: 04/01/2024 1033 to 04/02/2024 1022 Patient history:  74 y.o. female with fevers of unknown origin and worsening mental status  in the setting of dementia and history of nonconvulsive seizures. She is on three antiepileptics, and unclear if she has been having seizures. EEG to evaluate for seizure Level of alertness: Awake, asleep AEDs during EEG study: LEV, VPA, LCM Technical aspects: This EEG study was done with scalp electrodes positioned according to the 10-20 International system of electrode placement. Electrical activity was reviewed with band pass filter of 1-70Hz , sensitivity of 7 uV/mm, display speed of 98mm/sec with a 60Hz  notched filter applied as appropriate. EEG data were recorded continuously and digitally stored.  Video monitoring was available and reviewed as appropriate. Description: The posterior dominant rhythm consists of 7.5 Hz activity of moderate voltage (25-35 uV) seen predominantly in posterior head regions, symmetric and reactive to eye opening and eye closing. Sleep was characterized by vertex waves, sleep spindles (12 to 14 Hz), maximal frontocentral region. Hyperventilation and photic stimulation were not performed.   IMPRESSION: This study is within normal limits. No seizures or epileptiform discharges were seen throughout the recording. A normal interictal EEG does not exclude the diagnosis of epilepsy. Priyanka O Yadav   DG Chest Port 1V same Day Result Date: 04/01/2024 CLINICAL DATA:  Fever. EXAM: PORTABLE CHEST 1 VIEW COMPARISON:  03/29/2024 FINDINGS: Stable normal sized heart. Tortuous and partially calcified thoracic aorta. Decreased linear  atelectasis in the left lower lung zone. The remainder of the lungs remain clear with normal vascularity. No acute bony abnormality. IMPRESSION: No acute abnormality.  Improved linear atelectasis on the left. Electronically Signed   By: Elspeth Bathe M.D.   On: 04/01/2024 15:18   DG FL GUIDED LUMBAR PUNCTURE Result Date: 04/01/2024 CLINICAL DATA:  74 year old female admitted with acute altered mental status, complicated by fever of unknown origin. IR was requested for diagnostic lumbar puncture. EXAM: DIAGNOSTIC LUMBAR PUNCTURE UNDER FLUOROSCOPIC GUIDANCE COMPARISON:  None Available. FLUOROSCOPY: Radiation Exposure Index (as provided by the fluoroscopic device): 13.5 mGy Kerma PROCEDURE: Informed consent was obtained from the patient prior to the procedure, including potential complications of headache, allergy, and pain. With the patient prone, the lower back was prepped with Betadine . 1% Lidocaine  was used for local anesthesia. Lumbar puncture was performed at the L4-L5 level using a 22 gauge needle with return of clear CSF with an opening pressure of 15 cm water. Five ml of CSF were obtained for laboratory studies. The patient tolerated the procedure well and there were no apparent complications. IMPRESSION: Technically successful lumbar puncture. Procedure performed Carlin Griffon, PA-C, under the direct supervision of Dr. Frederic Specking, MD Electronically Signed   By: CHRISTELLA.  Shick M.D.   On: 04/01/2024 08:42   DG Chest Port 1 View Result Date: 03/29/2024 CLINICAL DATA:  Fever EXAM: PORTABLE CHEST 1 VIEW COMPARISON:  March 24, 2024 FINDINGS: The heart size and mediastinal contours are within normal limits. Right lung is clear. Minimal left lingular subsegmental atelectasis is noted. The visualized skeletal structures are unremarkable. IMPRESSION: Minimal left lingular subsegmental atelectasis. Electronically Signed   By: Lynwood Landy Raddle M.D.   On: 03/29/2024 17:55   MR BRAIN WO CONTRAST Result Date:  03/27/2024 EXAM: MRI BRAIN WITHOUT CONTRAST 03/27/2024 12:03:52 PM TECHNIQUE: Multiplanar multisequence MRI of the head/brain was performed without the administration of intravenous contrast. COMPARISON: MRI Head Without IV contrast 01/02/2024; 09/12/2023. CLINICAL HISTORY: right-sided weakness FINDINGS: BRAIN AND VENTRICLES: Scattered and confluent T2 and FLAIR hyperintensity in the periventricular and subcortical white matter suggestive of moderate chronic microvascular ischemic changes. Moderate generalized parenchymal volume loss. There is concordant prominence of  the lateral ventricles. No acute infarct. No intracranial hemorrhage. No mass. No midline shift. No hydrocephalus. The sella is unremarkable. Normal flow voids. ORBITS: No acute abnormality. SINUSES AND MASTOIDS: Moderate left mastoid effusion. BONES AND SOFT TISSUES: Normal marrow signal. No acute soft tissue abnormality. Degenerative changes in the visualized upper cervical spine. IMPRESSION: 1. No acute intracranial abnormality. 2. Moderate chronic microvascular ischemic changes. 3. Moderate generalized parenchymal volume loss with concordant prominence of the lateral ventricles. 4. Moderate left mastoid effusion. Electronically signed by: Donnice Mania MD 03/27/2024 01:54 PM EST RP Workstation: [id number]   US  Venous Img Upper Uni Right(DVT) Result Date: 03/27/2024 CLINICAL DATA:  74 year old female with history of right upper extremity pain and swelling. EXAM: RIGHT UPPER EXTREMITY VENOUS DOPPLER ULTRASOUND TECHNIQUE: Gray-scale sonography with graded compression, as well as color Doppler and duplex ultrasound were performed to evaluate the upper extremity deep venous system from the level of the subclavian vein and including the jugular, axillary, basilic, radial, ulnar and upper cephalic vein. Spectral Doppler was utilized to evaluate flow at rest and with distal augmentation maneuvers. COMPARISON:  None Available. FINDINGS: Contralateral  Subclavian Vein: Respiratory phasicity is normal and symmetric with the symptomatic side. No evidence of thrombus. Normal compressibility. Internal Jugular Vein: No evidence of thrombus. Normal compressibility, respiratory phasicity and response to augmentation. Subclavian Vein: No evidence of thrombus. Normal compressibility, respiratory phasicity and response to augmentation. Axillary Vein: No evidence of thrombus. Normal compressibility, respiratory phasicity and response to augmentation. Cephalic Vein: Not visualized. Basilic Vein: No evidence of thrombus. Normal compressibility, respiratory phasicity and response to augmentation. Brachial Veins: No evidence of thrombus. Normal compressibility, respiratory phasicity and response to augmentation. Radial Veins: No evidence of thrombus. Normal compressibility, respiratory phasicity and response to augmentation. Ulnar Veins: No evidence of thrombus. Normal compressibility, respiratory phasicity and response to augmentation. Other Findings:  None visualized. IMPRESSION: No evidence of superficial or deep venous thrombosis in the right upper extremity. Ester Sides, MD [facility] Electronically Signed   By: Ester Sides M.D.   On: 03/27/2024 08:49     TODAY-DAY OF DISCHARGE:  Subjective:   Alicia Brennan today has no headache,no chest abdominal pain,no new weakness tingling or numbness Objective:   Blood pressure 100/60, pulse 93, temperature 98.4 F (36.9 C), temperature source Oral, resp. rate 16, height 5' 4 (1.626 m), weight 68.8 kg, SpO2 94%.  Intake/Output Summary (Last 24 hours) at 04/23/2024 1034 Last data filed at 04/23/2024 0006 Gross per 24 hour  Intake --  Output 400 ml  Net -400 ml   Filed Weights   03/31/24 2118 04/17/24 0800 04/22/24 0538  Weight: 69 kg 67.5 kg 68.8 kg    Exam: Awake Alert,  No new F.N deficits, Normal affect [place].AT,PERRAL Supple Neck,No JVD, No  cervical lymphadenopathy appriciated.  Symmetrical Chest wall movement, Good air movement bilaterally, CTAB RRR,No Gallops,Rubs or new Murmurs, No Parasternal Heave +ve B.Sounds, Abd Soft, Non tender, No organomegaly appriciated, No rebound -guarding or rigidity. No Cyanosis, Clubbing or edema, No new Rash or bruise   PERTINENT RADIOLOGIC STUDIES: No results found.   PERTINENT LAB RESULTS: CBC: Recent Labs    04/22/24 1352  WBC 7.8  HGB 10.9*  HCT 33.4*  PLT 283   CMET CMP     Component Value Date/Time   NA 140 04/22/2024 1352   NA 136 05/17/2013 1525   K 4.4 04/22/2024 1352   K 4.2 05/17/2013 1525   CL 104 04/22/2024 1352  CL 107 05/17/2013 1525   CO2 26 04/22/2024 1352   CO2 26 05/17/2013 1525   GLUCOSE 88 04/22/2024 1352   GLUCOSE 85 05/17/2013 1525   BUN 23 04/22/2024 1352   BUN 10 05/17/2013 1525   CREATININE 0.84 04/22/2024 1352   CREATININE 0.82 05/17/2013 1525   CALCIUM 9.5 04/22/2024 1352   CALCIUM 8.7 05/17/2013 1525   PROT 7.0 04/01/2024 0257   PROT 7.6 05/17/2013 1525   ALBUMIN  2.7 (L) 04/01/2024 0257   ALBUMIN  3.3 (L) 05/17/2013 1525   AST 90 (H) 04/01/2024 0257   AST 31 05/17/2013 1525   ALT 41 04/01/2024 0257   ALT 22 05/17/2013 1525   ALKPHOS 80 04/01/2024 0257   ALKPHOS 143 (H) 05/17/2013 1525   BILITOT 0.4 04/01/2024 0257   BILITOT 0.7 05/17/2013 1525   GFRNONAA >60 04/22/2024 1352   GFRNONAA >60 05/17/2013 1525    GFR Estimated Creatinine Clearance: 55.1 mL/min (by C-G formula based on SCr of 0.84 mg/dL). No results for input(s): LIPASE, AMYLASE in the last 72 hours. No results for input(s): CKTOTAL, CKMB, CKMBINDEX, TROPONINI in the last 72 hours. Invalid input(s): POCBNP No results for input(s): DDIMER in the last 72 hours. No results for input(s): HGBA1C in the last 72 hours. No results for input(s): CHOL, HDL, LDLCALC, TRIG, CHOLHDL, LDLDIRECT in the last 72 hours. No results for input(s): TSH,  T4TOTAL, T3FREE, THYROIDAB in the last 72 hours.  Invalid input(s): FREET3 No results for input(s): VITAMINB12, FOLATE, FERRITIN, TIBC, IRON, RETICCTPCT in the last 72 hours. Coags: No results for input(s): INR in the last 72 hours.  Invalid input(s): PT Microbiology: No results found for this or any previous visit (from the past 240 hours).  FURTHER DISCHARGE INSTRUCTIONS:  Get Medicines reviewed and adjusted: Please take all your medications with you for your next visit with your Primary MD  Laboratory/radiological data: Please request your Primary MD to go over all hospital tests and procedure/radiological results at the follow up, please ask your Primary MD to get all Hospital records sent to his/her office.  In some cases, they will be blood work, cultures and biopsy results pending at the time of your discharge. Please request that your primary care M.D. goes through all the records of your hospital data and follows up on these results.  Also Note the following: If you experience worsening of your admission symptoms, develop shortness of breath, life threatening emergency, suicidal or homicidal thoughts you must seek medical attention immediately by calling 911 or calling your MD immediately  if symptoms less severe.  You must read complete instructions/literature along with all the possible adverse reactions/side effects for all the Medicines you take and that have been prescribed to you. Take any new Medicines after you have completely understood and accpet all the possible adverse reactions/side effects.   Do not drive when taking Pain medications or sleeping medications (Benzodaizepines)  Do not take more than prescribed Pain, Sleep and Anxiety Medications. It is not advisable to combine anxiety,sleep and pain medications without talking with your primary care practitioner  Special Instructions: If you have smoked or chewed Tobacco  in the last 2 yrs  please stop smoking, stop any regular Alcohol   and or any Recreational drug use.  Wear Seat belts while driving.  Please note: You were cared for by a hospitalist during your hospital stay. Once you are discharged, your primary care physician will handle any further medical issues. Please note that NO REFILLS for any discharge medications  will be authorized once you are discharged, as it is imperative that you return to your primary care physician (or establish a relationship with a primary care physician if you do not have one) for your post hospital discharge needs so that they can reassess your need for medications and monitor your lab values.  Total Time spent coordinating discharge including counseling, education and face to face time equals greater than 30 minutes.  SignedBETHA Donalda Applebaum 04/23/2024 10:34 AM      [1] No Known Allergies  "

## 2024-04-23 NOTE — TOC Progression Note (Signed)
 Transition of Care [facility]) - Progression Note    Patient Details  Name: Alicia Brennan MRN: [id number] Date of Birth: 10-Apr-1950  [facility] CM/SW Contact  [name], LCSW Phone Number: 04/23/2024, 8:51 AM  Clinical Narrative:    Per [name], they should have a bed available for patient today. They confirmed patient could have [facility] follow her there.   CSW updated patient's daughter who reported agreement with plan for [facility] transport.   CSW updated [facility].    Expected Discharge Plan: Skilled Nursing Facility Barriers to Discharge: Barriers Resolved               Expected Discharge Plan and Services In-house Referral: Clinical Social Work   Post Acute Care Choice: Skilled Nursing Facility Living arrangements for the past 2 months: Skilled Nursing Facility                                       Social Drivers of Health (SDOH) Interventions SDOH Screenings   Food Insecurity: Patient Unable To Answer (03/31/2024)  Recent Concern: Food Insecurity - Food Insecurity Present (01/31/2024)  Housing: Patient Unable To Answer (03/31/2024)  Transportation Needs: No Transportation Needs (01/31/2024)  Utilities: Not At Risk (01/31/2024)  Financial Resource Strain: Low Risk (11/16/2023)   Received from [facility]  Social Connections: Unknown (03/31/2024)  Recent Concern: Social Connections - Moderately Isolated (01/31/2024)  Tobacco Use: Low Risk (04/01/2024)    Readmission Risk Interventions     No data to display

## 2024-04-24 ENCOUNTER — Emergency Department (HOSPITAL_COMMUNITY)
Admission: EM | Admit: 2024-04-24 | Discharge: 2024-04-25 | Disposition: A | Discharge status: Skilled Nursing Facility | Attending: Emergency Medicine | Admitting: Emergency Medicine

## 2024-04-24 ENCOUNTER — Emergency Department (HOSPITAL_COMMUNITY)

## 2024-04-24 ENCOUNTER — Other Ambulatory Visit: Payer: Self-pay

## 2024-04-24 DIAGNOSIS — F0390 Unspecified dementia without behavioral disturbance: Secondary | ICD-10-CM | POA: Diagnosis not present

## 2024-04-24 DIAGNOSIS — R509 Fever, unspecified: Secondary | ICD-10-CM

## 2024-04-24 DIAGNOSIS — N3000 Acute cystitis without hematuria: Secondary | ICD-10-CM | POA: Insufficient documentation

## 2024-04-24 LAB — COMPREHENSIVE METABOLIC PANEL WITH GFR
ALT: 65 U/L — ABNORMAL HIGH (ref 0–44)
AST: 108 U/L — ABNORMAL HIGH (ref 15–41)
Albumin: 2.8 g/dL — ABNORMAL LOW (ref 3.5–5.0)
Alkaline Phosphatase: 96 U/L (ref 38–126)
Anion gap: 13 (ref 5–15)
BUN: 34 mg/dL — ABNORMAL HIGH (ref 8–23)
CO2: 24 mmol/L (ref 22–32)
Calcium: 9.7 mg/dL (ref 8.9–10.3)
Chloride: 108 mmol/L (ref 98–111)
Creatinine, Ser: 1.18 mg/dL — ABNORMAL HIGH (ref 0.44–1.00)
GFR, Estimated: 48 mL/min — ABNORMAL LOW
Glucose, Bld: 114 mg/dL — ABNORMAL HIGH (ref 70–99)
Potassium: 4.9 mmol/L (ref 3.5–5.1)
Sodium: 145 mmol/L (ref 135–145)
Total Bilirubin: 0.6 mg/dL (ref 0.0–1.2)
Total Protein: 7.9 g/dL (ref 6.5–8.1)

## 2024-04-24 LAB — CBC
HCT: 37.8 % (ref 36.0–46.0)
Hemoglobin: 12 g/dL (ref 12.0–15.0)
MCH: 30.1 pg (ref 26.0–34.0)
MCHC: 31.7 g/dL (ref 30.0–36.0)
MCV: 94.7 fL (ref 80.0–100.0)
Platelets: 299 10*3/uL (ref 150–400)
RBC: 3.99 MIL/uL (ref 3.87–5.11)
RDW: 15.2 % (ref 11.5–15.5)
WBC: 10.4 10*3/uL (ref 4.0–10.5)
nRBC: 0 % (ref 0.0–0.2)

## 2024-04-24 LAB — RESP PANEL BY RT-PCR (RSV, FLU A&B, COVID)  RVPGX2
Influenza A by PCR: NEGATIVE
Influenza B by PCR: NEGATIVE
Resp Syncytial Virus by PCR: NEGATIVE
SARS Coronavirus 2 by RT PCR: NEGATIVE

## 2024-04-24 MED ORDER — SODIUM CHLORIDE 0.9 % IV SOLN
2.0000 g | Freq: Once | INTRAVENOUS | Status: AC
  Administered 2024-04-25: 2 g via INTRAVENOUS
  Filled 2024-04-24: qty 20

## 2024-04-24 MED ORDER — ONDANSETRON HCL 4 MG/2ML IJ SOLN
4.0000 mg | Freq: Once | INTRAMUSCULAR | Status: AC
  Administered 2024-04-24: 4 mg via INTRAVENOUS
  Filled 2024-04-24: qty 2

## 2024-04-24 MED ORDER — ACETAMINOPHEN 500 MG PO TABS
1000.0000 mg | ORAL_TABLET | Freq: Once | ORAL | Status: DC
  Filled 2024-04-24: qty 2

## 2024-04-24 MED ORDER — LACTATED RINGERS IV BOLUS
1000.0000 mL | Freq: Once | INTRAVENOUS | Status: AC
  Administered 2024-04-24: 1000 mL via INTRAVENOUS

## 2024-04-24 MED ORDER — MORPHINE SULFATE (PF) 4 MG/ML IV SOLN
4.0000 mg | Freq: Once | INTRAVENOUS | Status: AC
  Administered 2024-04-24: 4 mg via INTRAVENOUS
  Filled 2024-04-24: qty 1

## 2024-04-24 NOTE — ED Notes (Signed)
 No in and out catheters available at this time. Someone has called and ordered more.

## 2024-04-24 NOTE — TOC CM/SW Note (Signed)
 TOC consult received for d/c planning. Per notes, patient discharged to LTC at [facility] yesterday with [facility]. Message sent via Epic to [facility] to determine if patient was admitted to their service for hospice. Awaiting response.   Merilee Batty, MSN, RN Case Management [phone]

## 2024-04-24 NOTE — ED Provider Notes (Signed)
 " [place] EMERGENCY DEPARTMENT AT [facility] Provider Note   CSN: [id number] Arrival date & time: 04/24/24  1849     Patient presents with: Hospice   Alicia Brennan is a 74 y.o. female.   Pt with hx dementia, d/c'd from hospital to [facility] yesterday under hospice care - today, family checked on patient and indicates were concerned b/c she looked in painand were concerned b/c oxygen level was mildly low, so requested patient be sent to ED.  Pt not verbally responsive at baseline - level 5 caveat. No report of trauma/fall. Pt is noted to have temp 101.   The history is provided by the patient, medical records, a relative and the EMS personnel. The history is limited by the condition of the patient.       Prior to Admission medications  Medication Sig Start Date End Date Taking? Authorizing Provider  acetaminophen  (TYLENOL ) 325 MG tablet Take 2 tablets (650 mg total) by mouth every 6 (six) hours as needed for mild pain (pain score 1-3) or fever (or Fever >/= 101). 04/23/24   Ghimire, Donalda HERO, MD  amLODipine  (NORVASC ) 5 MG tablet Take 1 tablet (5 mg total) by mouth daily. 04/24/24   Ghimire, Donalda HERO, MD  carvedilol  (COREG ) 3.125 MG tablet Take 1 tablet (3.125 mg total) by mouth 2 (two) times daily with a meal. 04/23/24   Ghimire, Donalda HERO, MD  divalproex  (DEPAKOTE ) 250 MG DR tablet Take 1 tablet (250 mg total) by mouth 3 (three) times daily. 03/25/18   Harris, Abigail, PA-C  feeding supplement (ENSURE PLUS HIGH PROTEIN) LIQD Take 237 mLs by mouth 2 (two) times daily between meals. 04/23/24   Ghimire, Donalda HERO, MD  isosorbide  mononitrate (IMDUR ) 30 MG 24 hr tablet Take 0.5 tablets (15 mg total) by mouth at bedtime. 04/23/24   Ghimire, Donalda HERO, MD  lacosamide  (VIMPAT ) 50 MG TABS tablet Take 1 tablet (50 mg total) by mouth 2 (two) times daily. 04/23/24   Ghimire, Donalda HERO, MD  levETIRAcetam  (KEPPRA ) 500 MG tablet Take 1 tablet (500 mg total) by mouth 2 (two) times daily.  12/11/23   Odell Celinda Balo, MD  ondansetron  (ZOFRAN ) 4 MG tablet Take 1 tablet (4 mg total) by mouth every 6 (six) hours as needed for nausea. 04/23/24   Ghimire, Donalda HERO, MD  pantoprazole  (PROTONIX ) 40 MG tablet Take 1 tablet (40 mg total) by mouth daily. 02/08/24 02/07/25  Regalado, Belkys A, MD  senna (SENOKOT) 8.6 MG TABS tablet Take 8.6 mg by mouth at bedtime.    [provider]  sertraline  (ZOLOFT ) 25 MG tablet Take 25 mg by mouth daily.    [provider]    Allergies: Patient has no known allergies.    Review of Systems  Unable to perform ROS: Dementia    Updated Vital Signs BP 128/67 (BP Location: Left Arm)   Pulse 96   Temp (!) 101.1 F (38.4 C) (Axillary)   Resp (!) 31   SpO2 97%   Physical Exam Vitals and nursing note reviewed.  Constitutional:      Appearance: Normal appearance. She is well-developed.  HENT:     Head: Atraumatic.     Comments: No sinus, mastoid, or  temporal tenderness.     Right Ear: Tympanic membrane normal.     Left Ear: Tympanic membrane normal.     Nose: Nose normal.     Mouth/Throat:     Mouth: Mucous membranes are moist.  Eyes:  General: No scleral icterus.    Conjunctiva/sclera: Conjunctivae normal.     Pupils: Pupils are equal, round, and reactive to light.  Neck:     Trachea: No tracheal deviation.     Comments: Trachea midline. Thyroid not grossly enlarged or tender. No neck stiffness or rigidity.  Cardiovascular:     Rate and Rhythm: Normal rate and regular rhythm.     Pulses: Normal pulses.     Heart sounds: Normal heart sounds. No murmur heard.    No friction rub. No gallop.  Pulmonary:     Effort: Pulmonary effort is normal. No respiratory distress.     Breath sounds: Normal breath sounds.  Abdominal:     General: Bowel sounds are normal. There is no distension.     Palpations: Abdomen is soft.     Tenderness: There is no abdominal tenderness. There is no guarding.  Genitourinary:    Comments:  No cva tenderness.  Musculoskeletal:        General: No swelling or tenderness.     Cervical back: Normal range of motion and neck supple. No rigidity. No muscular tenderness.     Comments: CTLS spine, non tender, aligned, no step off. Good passive rom bil extremities without pain. No focal swelling or bony tenderness.  Lymphadenopathy:     Cervical: No cervical adenopathy.  Skin:    General: Skin is warm and dry.     Findings: No rash.  Neurological:     Mental Status: She is alert.     Comments: Appears awake. Not verbally responsive at baseline. Moves bil extremities.      (all labs ordered are listed, but only abnormal results are displayed) Results for orders placed or performed during the hospital encounter of 04/24/24  CBC   Collection Time: 04/24/24  8:07 PM  Result Value Ref Range   WBC 10.4 4.0 - 10.5 K/uL   RBC 3.99 3.87 - 5.11 MIL/uL   Hemoglobin 12.0 12.0 - 15.0 g/dL   HCT 62.1 63.9 - 53.9 %   MCV 94.7 80.0 - 100.0 fL   MCH 30.1 26.0 - 34.0 pg   MCHC 31.7 30.0 - 36.0 g/dL   RDW 84.7 88.4 - 84.4 %   Platelets 299 150 - 400 K/uL   nRBC 0.0 0.0 - 0.2 %  Comprehensive metabolic panel with GFR   Collection Time: 04/24/24  8:07 PM  Result Value Ref Range   Sodium 145 135 - 145 mmol/L   Potassium 4.9 3.5 - 5.1 mmol/L   Chloride 108 98 - 111 mmol/L   CO2 24 22 - 32 mmol/L   Glucose, Bld 114 (H) 70 - 99 mg/dL   BUN 34 (H) 8 - 23 mg/dL   Creatinine, Ser 8.81 (H) 0.44 - 1.00 mg/dL   Calcium 9.7 8.9 - 89.6 mg/dL   Total Protein 7.9 6.5 - 8.1 g/dL   Albumin  2.8 (L) 3.5 - 5.0 g/dL   AST 891 (H) 15 - 41 U/L   ALT 65 (H) 0 - 44 U/L   Alkaline Phosphatase 96 38 - 126 U/L   Total Bilirubin 0.6 0.0 - 1.2 mg/dL   GFR, Estimated 48 (L) >60 mL/min   Anion gap 13 5 - 15  Resp panel by RT-PCR (RSV, Flu A&B, Covid) Anterior Nasal Swab   Collection Time: 04/24/24  9:48 PM   Specimen: Anterior Nasal Swab  Result Value Ref Range   SARS Coronavirus 2 by RT PCR NEGATIVE NEGATIVE    Influenza  A by PCR NEGATIVE NEGATIVE   Influenza B by PCR NEGATIVE NEGATIVE   Resp Syncytial Virus by PCR NEGATIVE NEGATIVE   DG Chest Port 1 View Result Date: 04/24/2024 EXAM: 1 VIEW(S) XRAY OF THE CHEST 04/24/2024 09:04:00 PM COMPARISON: 04/01/2024 CLINICAL HISTORY: Fever. FINDINGS: LUNGS AND PLEURA: No focal pulmonary opacity. No pleural effusion. No pneumothorax. HEART AND MEDIASTINUM: Aortic atherosclerosis. No acute abnormality of the cardiac and mediastinal silhouettes. BONES AND SOFT TISSUES: No acute osseous abnormality. IMPRESSION: 1. No acute findings. Electronically signed by: Oneil Devonshire MD 04/24/2024 09:08 PM EST RP Workstation: [id number]   CT CHEST WO CONTRAST Result Date: 04/16/2024 EXAM: CT CHEST WITHOUT CONTRAST 04/16/2024 07:11:53 PM TECHNIQUE: CT of the chest was performed without the administration of intravenous contrast. Multiplanar reformatted images are provided for review. Automated exposure control, iterative reconstruction, and/or weight based adjustment of the mA/kV was utilized to reduce the radiation dose to as low as reasonably achievable. COMPARISON: Prior study from 08/06/2015. CLINICAL HISTORY: Pulmonary nodule seen on CT cervical spine last year, as well evaluate for lymphadenopathy mainly in the right axilla given persistent swelling. FINDINGS: MEDIASTINUM: Heart and pericardium are unremarkable. Layering low-density nodular area noted in the mid trachea, likely mucus. LYMPH NODES: No mediastinal, hilar or axillary lymphadenopathy. In particular, no right axillary adenopathy or abnormality. LUNGS AND PLEURA: Medial right apical nodule again noted measuring 10 mm on image 30 of series 5. Not significantly changed when compared to prior study. This measured only minimally smaller when compared to the study from 08/06/2015, when this measured maximally 9 mm. No significant change over nearly 7 years is compatible with a benign nodule. No new or enlarging pulmonary  nodules. Linear scarring in the lung bases. No focal consolidation or pulmonary edema. No pleural effusion or pneumothorax. SOFT TISSUES/BONES: No acute abnormality of the bones or soft tissues. UPPER ABDOMEN: Limited images of the upper abdomen demonstrates no acute abnormality. VASCULATURE: Scattered aortic atherosclerosis. IMPRESSION: 1. Medial right apical nodule measuring 10 mm, not significantly changed dating back to 2017 compatible with a benign nodule. 2. No new or enlarging pulmonary nodules. 3. Aortic atherosclerosis. 4. Low density material layering dependently in the mid trachea, likely mucus/secretions. Electronically signed by: Franky Crease MD 04/16/2024 07:21 PM EST RP Workstation: [id number]   VAS US  UPPER EXTREMITY VENOUS DUPLEX Result Date: 04/15/2024 UPPER VENOUS STUDY  Patient Name:  KIANNI LHEUREUX  Date of Exam:   04/15/2024 Medical Rec #: [id number]       Accession #:    [id number] Date of Birth: [birth year]        Patient Gender: F Patient Age:   50 years Exam Location:  [facility] Procedure:      VAS US  UPPER EXTREMITY VENOUS DUPLEX Referring Phys: REDIA CLEAVER --------------------------------------------------------------------------------  Indications: Swelling Other Indications: History of essential hypertension. Risk Factors: Chronic right-sided weakness with associated right foot drop, seizures. Comparison Study: 03/26/24 - Right upper ext venous negative Performing Technologist: Ricka Sturdivant-Jones RDMS, RVT  Examination Guidelines: A complete evaluation includes B-mode imaging, spectral Doppler, color Doppler, and power Doppler as needed of all accessible portions of each vessel. Bilateral testing is considered an integral part of a complete examination. Limited examinations for reoccurring indications may be performed as noted.  Right Findings: +----------+------------+---------+-----------+----------+-------+ RIGHT      CompressiblePhasicitySpontaneousPropertiesSummary +----------+------------+---------+-----------+----------+-------+ IJV           Full       Yes       Yes                      +----------+------------+---------+-----------+----------+-------+  Subclavian    Full       Yes       Yes                      +----------+------------+---------+-----------+----------+-------+ Axillary      Full       Yes       Yes                      +----------+------------+---------+-----------+----------+-------+ Brachial      Full                                          +----------+------------+---------+-----------+----------+-------+ Radial        Full                                          +----------+------------+---------+-----------+----------+-------+ Ulnar         Full                                          +----------+------------+---------+-----------+----------+-------+ Cephalic      Full                                          +----------+------------+---------+-----------+----------+-------+ Basilic       Full                                          +----------+------------+---------+-----------+----------+-------+  Left Findings: +----------+------------+---------+-----------+----------+-------+ LEFT      CompressiblePhasicitySpontaneousPropertiesSummary +----------+------------+---------+-----------+----------+-------+ IJV           Full       Yes       Yes                      +----------+------------+---------+-----------+----------+-------+ Subclavian    Full       Yes       Yes                      +----------+------------+---------+-----------+----------+-------+ Axillary      Full       Yes       Yes                      +----------+------------+---------+-----------+----------+-------+ Brachial      Full                                          +----------+------------+---------+-----------+----------+-------+  Radial        Full                                          +----------+------------+---------+-----------+----------+-------+ Ulnar         Full                                          +----------+------------+---------+-----------+----------+-------+  Cephalic      Full                                          +----------+------------+---------+-----------+----------+-------+ Basilic       Full                                          +----------+------------+---------+-----------+----------+-------+  Summary: No evidence of deep vein or superficial vein thrombosis involving the right and left upper extremities.  *See table(s) above for measurements and observations.  Diagnosing physician: Gaile New MD Electronically signed by Gaile New MD on 04/15/2024 at 12:44:42 PM.    Final    Overnight EEG with video Result Date: 04/02/2024 Shelton Arlin KIDD, MD     04/02/2024 11:36 AM Patient Name: Tandy Grawe MRN: [id number] Epilepsy Attending: Arlin KIDD Shelton Referring Physician/Provider: Khaliqdina, Salman, MD Duration: 04/01/2024 1033 to 04/02/2024 1022 Patient history:  74 y.o. female with fevers of unknown origin and worsening mental status in the setting of dementia and history of nonconvulsive seizures. She is on three antiepileptics, and unclear if she has been having seizures. EEG to evaluate for seizure Level of alertness: Awake, asleep AEDs during EEG study: LEV, VPA, LCM Technical aspects: This EEG study was done with scalp electrodes positioned according to the 10-20 International system of electrode placement. Electrical activity was reviewed with band pass filter of 1-70Hz , sensitivity of 7 uV/mm, display speed of 78mm/sec with a 60Hz  notched filter applied as appropriate. EEG data were recorded continuously and digitally stored.  Video monitoring was available and reviewed as appropriate. Description: The posterior dominant rhythm consists of 7.5 Hz activity of moderate  voltage (25-35 uV) seen predominantly in posterior head regions, symmetric and reactive to eye opening and eye closing. Sleep was characterized by vertex waves, sleep spindles (12 to 14 Hz), maximal frontocentral region. Hyperventilation and photic stimulation were not performed.   IMPRESSION: This study is within normal limits. No seizures or epileptiform discharges were seen throughout the recording. A normal interictal EEG does not exclude the diagnosis of epilepsy. Priyanka O Yadav   DG Chest Port 1V same Day Result Date: 04/01/2024 CLINICAL DATA:  Fever. EXAM: PORTABLE CHEST 1 VIEW COMPARISON:  03/29/2024 FINDINGS: Stable normal sized heart. Tortuous and partially calcified thoracic aorta. Decreased linear atelectasis in the left lower lung zone. The remainder of the lungs remain clear with normal vascularity. No acute bony abnormality. IMPRESSION: No acute abnormality.  Improved linear atelectasis on the left. Electronically Signed   By: Elspeth Bathe M.D.   On: 04/01/2024 15:18   DG FL GUIDED LUMBAR PUNCTURE Result Date: 04/01/2024 CLINICAL DATA:  74 year old female admitted with acute altered mental status, complicated by fever of unknown origin. IR was requested for diagnostic lumbar puncture. EXAM: DIAGNOSTIC LUMBAR PUNCTURE UNDER FLUOROSCOPIC GUIDANCE COMPARISON:  None Available. FLUOROSCOPY: Radiation Exposure Index (as provided by the fluoroscopic device): 13.5 mGy Kerma PROCEDURE: Informed consent was obtained from the patient prior to the procedure, including potential complications of headache, allergy, and pain. With the patient prone, the lower back was prepped with Betadine . 1% Lidocaine  was used for local anesthesia. Lumbar puncture was performed at the L4-L5 level using a 22 gauge needle with return of clear CSF with an opening pressure of 15 cm water. Five ml of  CSF were obtained for laboratory studies. The patient tolerated the procedure well and there were no apparent complications.  IMPRESSION: Technically successful lumbar puncture. Procedure performed Carlin Griffon, PA-C, under the direct supervision of Dr. Frederic Specking, MD Electronically Signed   By: CHRISTELLA.  Shick M.D.   On: 04/01/2024 08:42   DG Chest Port 1 View Result Date: 03/29/2024 CLINICAL DATA:  Fever EXAM: PORTABLE CHEST 1 VIEW COMPARISON:  March 24, 2024 FINDINGS: The heart size and mediastinal contours are within normal limits. Right lung is clear. Minimal left lingular subsegmental atelectasis is noted. The visualized skeletal structures are unremarkable. IMPRESSION: Minimal left lingular subsegmental atelectasis. Electronically Signed   By: Lynwood Landy Raddle M.D.   On: 03/29/2024 17:55   MR BRAIN WO CONTRAST Result Date: 03/27/2024 EXAM: MRI BRAIN WITHOUT CONTRAST 03/27/2024 12:03:52 PM TECHNIQUE: Multiplanar multisequence MRI of the head/brain was performed without the administration of intravenous contrast. COMPARISON: MRI Head Without IV contrast 01/02/2024; 09/12/2023. CLINICAL HISTORY: right-sided weakness FINDINGS: BRAIN AND VENTRICLES: Scattered and confluent T2 and FLAIR hyperintensity in the periventricular and subcortical white matter suggestive of moderate chronic microvascular ischemic changes. Moderate generalized parenchymal volume loss. There is concordant prominence of the lateral ventricles. No acute infarct. No intracranial hemorrhage. No mass. No midline shift. No hydrocephalus. The sella is unremarkable. Normal flow voids. ORBITS: No acute abnormality. SINUSES AND MASTOIDS: Moderate left mastoid effusion. BONES AND SOFT TISSUES: Normal marrow signal. No acute soft tissue abnormality. Degenerative changes in the visualized upper cervical spine. IMPRESSION: 1. No acute intracranial abnormality. 2. Moderate chronic microvascular ischemic changes. 3. Moderate generalized parenchymal volume loss with concordant prominence of the lateral ventricles. 4. Moderate left mastoid effusion. Electronically signed by:  Donnice Mania MD 03/27/2024 01:54 PM EST RP Workstation: [id number]   US  Venous Img Upper Uni Right(DVT) Result Date: 03/27/2024 CLINICAL DATA:  74 year old female with history of right upper extremity pain and swelling. EXAM: RIGHT UPPER EXTREMITY VENOUS DOPPLER ULTRASOUND TECHNIQUE: Gray-scale sonography with graded compression, as well as color Doppler and duplex ultrasound were performed to evaluate the upper extremity deep venous system from the level of the subclavian vein and including the jugular, axillary, basilic, radial, ulnar and upper cephalic vein. Spectral Doppler was utilized to evaluate flow at rest and with distal augmentation maneuvers. COMPARISON:  None Available. FINDINGS: Contralateral Subclavian Vein: Respiratory phasicity is normal and symmetric with the symptomatic side. No evidence of thrombus. Normal compressibility. Internal Jugular Vein: No evidence of thrombus. Normal compressibility, respiratory phasicity and response to augmentation. Subclavian Vein: No evidence of thrombus. Normal compressibility, respiratory phasicity and response to augmentation. Axillary Vein: No evidence of thrombus. Normal compressibility, respiratory phasicity and response to augmentation. Cephalic Vein: Not visualized. Basilic Vein: No evidence of thrombus. Normal compressibility, respiratory phasicity and response to augmentation. Brachial Veins: No evidence of thrombus. Normal compressibility, respiratory phasicity and response to augmentation. Radial Veins: No evidence of thrombus. Normal compressibility, respiratory phasicity and response to augmentation. Ulnar Veins: No evidence of thrombus. Normal compressibility, respiratory phasicity and response to augmentation. Other Findings:  None visualized. IMPRESSION: No evidence of superficial or deep venous thrombosis in the right upper extremity. Ester Sides, MD [facility] Electronically Signed    By: Ester Sides M.D.   On: 03/27/2024 08:49     EKG: None  Radiology: [facility] Chest Port 1 View Result Date: 04/24/2024 EXAM: 1 VIEW(S) XRAY OF THE CHEST 04/24/2024 09:04:00 PM COMPARISON: 04/01/2024 CLINICAL HISTORY: Fever. FINDINGS: LUNGS AND PLEURA: No focal pulmonary  opacity. No pleural effusion. No pneumothorax. HEART AND MEDIASTINUM: Aortic atherosclerosis. No acute abnormality of the cardiac and mediastinal silhouettes. BONES AND SOFT TISSUES: No acute osseous abnormality. IMPRESSION: 1. No acute findings. Electronically signed by: Oneil Devonshire MD 04/24/2024 09:08 PM EST RP Workstation: [id number]     Procedures   Medications Ordered in the ED  morphine  (PF) 4 MG/ML injection 4 mg (4 mg Intravenous Given 04/24/24 2206)  ondansetron  (ZOFRAN ) injection 4 mg (4 mg Intravenous Given 04/24/24 2206)                                    Medical Decision Making Problems Addressed: Acute febrile illness: acute illness or injury with systemic symptoms that poses a threat to life or bodily functions  Amount and/or Complexity of Data Reviewed Independent Historian: EMS    Details: Ems/family External Data Reviewed: notes. Labs: ordered. Decision-making details documented in ED Course. Radiology: ordered and independent interpretation performed. Decision-making details documented in ED Course.  Risk Prescription drug management. Parenteral controlled substances. Decision regarding hospitalization.   Iv ns. Continuous pulse ox and cardiac monitoring. Labs ordered/sent. Imaging ordered.   Differential diagnosis includes covid, flu, pna, uti, etc. Dispo decision including potential need for admission considered - will get labs and imaging and reassess.   Reviewed nursing notes and prior charts for additional history. External reports reviewed. Additional history from: ems/family.  TOC consulted - they have spoken with facility and indicates patient can return there, and can stay there  until bed becomes available at [facility].   Explored pt/family understanding of hospice care, and attempted to clarify goals of care (I.e. in setting of requesting patient be sent to hospital one day after d/c to snf under hospice care).  Family member indicates would want to know if uti or pna, and/or give abx for same, but says no aggressive intervention or invasive type of procedures, but wants xrays, lab eval.  Discussed that from review of records overall course and trajectory and earlier discussions appeared c/w comfort care, with receiving things like meds to ease breathing/wob or meds to help with pain, and without diagnostic eval/tests/etcs - for now, family requests ed basic labs/cxr.   Cardiac monitor: sinus rhythm, rate 90.  Family request pain medication (not clear source of pain) - provided.   Labs reviewed/interpreted by me - covid and flu neg. Wbc 10, hgb 12. Chem largely unremarkable, mildly elevated ast/alt. Bili and alk phos normal, recent imaging neg for acute process. Abd soft nt.   Xrays reviewed/interpreted by me - no pna.   2320 resting comfortably, breathing comfortably. Await UA. Signed out to Dr Theadore.        Final diagnoses:  Acute febrile illness    ED Discharge Orders     None          Bernard Drivers, MD 04/24/24 2320  "

## 2024-04-24 NOTE — Progress Notes (Addendum)
 Patient is a 74 yo f with a hx of encephalopathy and dementia who was discharged from this hospital yesterday to [facility] w/ hospice arranged through [facility] who presents to the ED for evaluation. Family found patient minimally responsive at the facility. Per chart and per facility staff, this appears to be the patient's baseline, but facility states that family was concerned for an acute process because the patient is normally more responsive to their voices than the voices of others and thus requested she present for evaluation.  TOC consult received for navigation of discharge barriers.   Per conversation with nursing at [facility], the facility is able to meet the patient's needs and there are no barriers to the patient returning when/if discharged from the ED. Would just need to call report and arrange transport. She was not formally admitted to hospice and an intake was scheduled for tomorrow. When messaged through secure chat, [facility] staff state there would be no barriers on their end to patient returning to the facility and following through with an intake tomorrow.   Patient's current presentation to the ED appears to stem not from the discretion of the facility or hospice staff but from that of her family. Will update attending.   TOC following.   9:47 - Met with patient's daughter, [name], at bedside. She shares that an [facility] RN named Tawni came out to the facility to evaluate the patient at her request, as the patient was not responding to her at all tonight and that this was different than her condition yesterday. She states that yesterday the patient was at least opening her eyes and looking around the room. The nurse evaluated the patient and the patient was accepted to the IPU at [facility], but there are no available beds tonight. Secure chat sent to [facility] to confirm, no answer thus far, though they made no mention of this during earlier discussion, reportedly their notes were not up to  date. Ultimately, the decision was made between [facility] and hospice RN to present to the ED for a minor work up and to better meet the patients needs. [name] felt her mother was uncomfortable and has been requesting pain meds, MD alerted.    [name] confirms her ultimate goal is for patient to achieve admission to an inpatient hospice unit, preferably at [facility]. There is not a bed available at this time, so patient may need to be discharged back to [facility] or remain bedded as a hold until one is available. TOC continuing to follow. Awaiting return message from [facility].

## 2024-04-24 NOTE — ED Triage Notes (Signed)
 Pt was BIB [facility] from [facility] because she is transferring to hospice and they can no longer keep her as she waits for a bed at the hospice facility. EMS also stated that the family wants the DNR/ MOST form completed while here. Patient became non verbal yesterday and had a temp of 99.4 per EMS but feels hotter to the touch.

## 2024-04-25 ENCOUNTER — Telehealth: Payer: Self-pay

## 2024-04-25 LAB — URINALYSIS, ROUTINE W REFLEX MICROSCOPIC
Bilirubin Urine: NEGATIVE
Glucose, UA: NEGATIVE mg/dL
Hgb urine dipstick: NEGATIVE
Ketones, ur: NEGATIVE mg/dL
Nitrite: POSITIVE — AB
Protein, ur: 30 mg/dL — AB
Specific Gravity, Urine: 1.016 (ref 1.005–1.030)
WBC, UA: >50 WBC/hpf (ref 0–5)
pH: 5 (ref 5.0–8.0)

## 2024-04-25 MED ORDER — KETOROLAC TROMETHAMINE 15 MG/ML IJ SOLN
15.0000 mg | Freq: Once | INTRAMUSCULAR | Status: AC
  Administered 2024-04-25: 15 mg via INTRAVENOUS
  Filled 2024-04-25: qty 1

## 2024-04-25 MED ORDER — CEPHALEXIN 500 MG PO CAPS: 500.0000 mg | ORAL_CAPSULE | Freq: Three times a day (TID) | ORAL | 0 refills | Status: AC

## 2024-04-25 NOTE — Telephone Encounter (Signed)
 Pt was discharged from [facility] to [facility] c/[facility] hospice on 04/23/2024. Family was concerned about the level of care the pt was receiving at [facility] and yesterday, 04/24/2024, had her brought to the [facility] ED for eval. Per notes, [facility] was notified and pt status was discussed c/family and they decided to pursue [facility] hospice rather that return to [place]. However, a bed at [facility] was not immediately available and pt was returned to [place] against the family's wishes. CM explained to pt's daughter that once the decision is made to not pursue medical treatment as in hospice, goals are for comfort, and pt was returned to [facility] where she could remain bedded until [facility] could accommodate. [facility] hospice is anticipating admission today, 04/25/2024.

## 2024-04-25 NOTE — Discharge Instructions (Signed)
 You were evaluated in the Emergency Department and after careful evaluation, we did not find any emergent condition requiring admission or further testing in the hospital.  Your exam/testing today is overall reassuring.  Symptoms may be due to a urinary tract infection.  Take the Keflex  antibiotic as directed.  Please return to the Emergency Department if you experience any worsening of your condition.   Thank you for allowing us  to be a part of your care.

## 2024-04-25 NOTE — ED Provider Notes (Signed)
" °  Provider Note MRN:  [id number]  Arrival date & time: 04/25/24    ED Course and Medical Decision Making  Assumed care of patient at sign-out or upon transfer.  Patient on hospice, plan is for limited workup to evaluate for fever, family does not want aggressive measures, not really interested in admission for advanced imaging.  Workup here showing evidence of urinary tract infection.  Provided with symptomatic management, dose of ceftriaxone , I confirmed this plan with patient's daughter over the phone, plan is for discharge to hospice facility on oral antibiotics.  Procedures  Final Clinical Impressions(s) / ED Diagnoses     ICD-10-CM   1. Acute febrile illness  R50.9     2. Acute cystitis without hematuria  N30.00       ED Discharge Orders          Ordered    cephALEXin  (KEFLEX ) 500 MG capsule  3 times daily        04/25/24 0054              Discharge Instructions      You were evaluated in the Emergency Department and after careful evaluation, we did not find any emergent condition requiring admission or further testing in the hospital.  Your exam/testing today is overall reassuring.  Symptoms may be due to a urinary tract infection.  Take the Keflex  antibiotic as directed.  Please return to the Emergency Department if you experience any worsening of your condition.   Thank you for allowing us  to be a part of your care.    Ozell HERO. Theadore, MD [facility] Emergency Medicine [facility] [email or web address]    Theadore Ozell HERO, MD 04/25/24 [phone]  "

## 2024-04-30 LAB — FUNGUS CULTURE RESULT

## 2024-04-30 LAB — FUNGUS CULTURE WITH STAIN

## 2024-04-30 LAB — FUNGAL ORGANISM REFLEX

## 2024-05-25 DEATH — deceased
